# Patient Record
Sex: Male | Born: 1935 | Race: White | Hispanic: No | Marital: Married | Smoking: Former smoker
Health system: Southern US, Community
[De-identification: ages and names within clinical notes are randomized; demographics above are authoritative.]

## PROBLEM LIST (undated history)

## (undated) DIAGNOSIS — J449 Chronic obstructive pulmonary disease, unspecified: Secondary | ICD-10-CM

## (undated) DIAGNOSIS — E349 Endocrine disorder, unspecified: Secondary | ICD-10-CM

## (undated) DIAGNOSIS — M1990 Unspecified osteoarthritis, unspecified site: Secondary | ICD-10-CM

## (undated) DIAGNOSIS — Z9989 Dependence on other enabling machines and devices: Secondary | ICD-10-CM

## (undated) DIAGNOSIS — Z87442 Personal history of urinary calculi: Secondary | ICD-10-CM

## (undated) DIAGNOSIS — E7800 Pure hypercholesterolemia, unspecified: Secondary | ICD-10-CM

## (undated) DIAGNOSIS — R252 Cramp and spasm: Secondary | ICD-10-CM

## (undated) DIAGNOSIS — IMO0002 Reserved for concepts with insufficient information to code with codable children: Secondary | ICD-10-CM

## (undated) DIAGNOSIS — G5600 Carpal tunnel syndrome, unspecified upper limb: Secondary | ICD-10-CM

## (undated) DIAGNOSIS — M25512 Pain in left shoulder: Secondary | ICD-10-CM

## (undated) DIAGNOSIS — R002 Palpitations: Secondary | ICD-10-CM

## (undated) DIAGNOSIS — N200 Calculus of kidney: Secondary | ICD-10-CM

## (undated) DIAGNOSIS — F419 Anxiety disorder, unspecified: Secondary | ICD-10-CM

## (undated) DIAGNOSIS — R5383 Other fatigue: Secondary | ICD-10-CM

## (undated) DIAGNOSIS — E876 Hypokalemia: Secondary | ICD-10-CM

## (undated) DIAGNOSIS — C3490 Malignant neoplasm of unspecified part of unspecified bronchus or lung: Secondary | ICD-10-CM

## (undated) DIAGNOSIS — R0683 Snoring: Secondary | ICD-10-CM

## (undated) HISTORY — DX: Personal history of urinary calculi: Z87.442

## (undated) HISTORY — DX: Anxiety disorder, unspecified: F41.9

## (undated) HISTORY — DX: Dependence on other enabling machines and devices: Z99.89

## (undated) HISTORY — DX: Endocrine disorder, unspecified: E34.9

## (undated) HISTORY — DX: Pain in left shoulder: M25.512

## (undated) HISTORY — DX: Snoring: R06.83

## (undated) HISTORY — DX: Unspecified osteoarthritis, unspecified site: M19.90

## (undated) HISTORY — DX: Reserved for concepts with insufficient information to code with codable children: IMO0002

## (undated) HISTORY — DX: Pure hypercholesterolemia, unspecified: E78.00

## (undated) HISTORY — DX: Carpal tunnel syndrome, unspecified upper limb: G56.00

## (undated) HISTORY — DX: Chronic obstructive pulmonary disease, unspecified: J44.9

## (undated) HISTORY — DX: Palpitations: R00.2

## (undated) HISTORY — DX: Hypokalemia: E87.6

## (undated) HISTORY — DX: Malignant neoplasm of unspecified part of unspecified bronchus or lung: C34.90

## (undated) HISTORY — DX: Calculus of kidney: N20.0

## (undated) HISTORY — DX: Cramp and spasm: R25.2

## (undated) HISTORY — DX: Other fatigue: R53.83

---

## 1976-01-15 HISTORY — PX: BACK SURGERY: SHX140

## 1997-12-27 ENCOUNTER — Emergency Department (HOSPITAL_COMMUNITY): Admission: EM | Admit: 1997-12-27 | Discharge: 1997-12-27 | Payer: Self-pay | Admitting: Emergency Medicine

## 1997-12-27 ENCOUNTER — Encounter: Payer: Self-pay | Admitting: Emergency Medicine

## 1998-01-18 ENCOUNTER — Encounter: Payer: Self-pay | Admitting: *Deleted

## 1998-01-18 ENCOUNTER — Ambulatory Visit (HOSPITAL_COMMUNITY): Admission: RE | Admit: 1998-01-18 | Discharge: 1998-01-18 | Payer: Self-pay | Admitting: *Deleted

## 2000-06-27 ENCOUNTER — Encounter: Admission: RE | Admit: 2000-06-27 | Discharge: 2000-06-27 | Payer: Self-pay | Admitting: Family Medicine

## 2000-06-27 ENCOUNTER — Encounter: Payer: Self-pay | Admitting: Family Medicine

## 2002-04-12 ENCOUNTER — Encounter: Admission: RE | Admit: 2002-04-12 | Discharge: 2002-04-12 | Payer: Self-pay | Admitting: Orthopedic Surgery

## 2002-04-12 ENCOUNTER — Encounter: Payer: Self-pay | Admitting: Orthopedic Surgery

## 2004-01-27 ENCOUNTER — Ambulatory Visit: Payer: Self-pay | Admitting: Cardiovascular Disease

## 2004-04-09 ENCOUNTER — Ambulatory Visit: Payer: Self-pay | Admitting: Family Medicine

## 2004-05-24 ENCOUNTER — Ambulatory Visit: Payer: Self-pay | Admitting: Family Medicine

## 2004-10-08 ENCOUNTER — Ambulatory Visit: Payer: Self-pay | Admitting: Family Medicine

## 2004-10-08 LAB — CONVERTED CEMR LAB: PSA: 0.43 ng/mL

## 2005-01-21 ENCOUNTER — Ambulatory Visit: Payer: Self-pay | Admitting: Family Medicine

## 2005-03-25 ENCOUNTER — Ambulatory Visit: Payer: Self-pay | Admitting: Family Medicine

## 2005-05-06 ENCOUNTER — Ambulatory Visit: Payer: Self-pay | Admitting: Family Medicine

## 2005-08-12 ENCOUNTER — Emergency Department (HOSPITAL_COMMUNITY): Admission: EM | Admit: 2005-08-12 | Discharge: 2005-08-12 | Payer: Self-pay | Admitting: Family Medicine

## 2005-09-09 ENCOUNTER — Ambulatory Visit: Payer: Self-pay | Admitting: Internal Medicine

## 2005-11-07 ENCOUNTER — Ambulatory Visit: Payer: Self-pay | Admitting: Family Medicine

## 2005-11-07 LAB — CONVERTED CEMR LAB: Hgb A1c MFr Bld: 6.1 %

## 2005-11-14 ENCOUNTER — Ambulatory Visit: Payer: Self-pay | Admitting: Family Medicine

## 2005-12-09 ENCOUNTER — Ambulatory Visit: Payer: Self-pay | Admitting: Family Medicine

## 2006-03-03 ENCOUNTER — Ambulatory Visit: Payer: Self-pay | Admitting: Family Medicine

## 2006-05-23 ENCOUNTER — Encounter (INDEPENDENT_AMBULATORY_CARE_PROVIDER_SITE_OTHER): Payer: Self-pay | Admitting: *Deleted

## 2006-12-05 ENCOUNTER — Telehealth: Payer: Self-pay | Admitting: Family Medicine

## 2006-12-08 ENCOUNTER — Telehealth: Payer: Self-pay | Admitting: Family Medicine

## 2007-01-01 ENCOUNTER — Telehealth: Payer: Self-pay | Admitting: Family Medicine

## 2007-01-06 ENCOUNTER — Telehealth: Payer: Self-pay | Admitting: Family Medicine

## 2007-01-07 ENCOUNTER — Ambulatory Visit: Payer: Self-pay | Admitting: Family Medicine

## 2007-01-07 DIAGNOSIS — K279 Peptic ulcer, site unspecified, unspecified as acute or chronic, without hemorrhage or perforation: Secondary | ICD-10-CM

## 2007-01-07 DIAGNOSIS — R7303 Prediabetes: Secondary | ICD-10-CM

## 2007-01-07 DIAGNOSIS — N200 Calculus of kidney: Secondary | ICD-10-CM

## 2007-01-07 DIAGNOSIS — Z8679 Personal history of other diseases of the circulatory system: Secondary | ICD-10-CM | POA: Insufficient documentation

## 2007-01-07 DIAGNOSIS — M19049 Primary osteoarthritis, unspecified hand: Secondary | ICD-10-CM | POA: Insufficient documentation

## 2007-01-07 DIAGNOSIS — G5600 Carpal tunnel syndrome, unspecified upper limb: Secondary | ICD-10-CM | POA: Insufficient documentation

## 2007-01-07 DIAGNOSIS — M25519 Pain in unspecified shoulder: Secondary | ICD-10-CM | POA: Insufficient documentation

## 2007-01-07 DIAGNOSIS — E7800 Pure hypercholesterolemia, unspecified: Secondary | ICD-10-CM

## 2007-01-07 DIAGNOSIS — F411 Generalized anxiety disorder: Secondary | ICD-10-CM | POA: Insufficient documentation

## 2007-01-09 ENCOUNTER — Telehealth: Payer: Self-pay | Admitting: Family Medicine

## 2007-01-14 ENCOUNTER — Ambulatory Visit: Payer: Self-pay | Admitting: Family Medicine

## 2007-01-16 ENCOUNTER — Telehealth: Payer: Self-pay | Admitting: Family Medicine

## 2007-01-23 ENCOUNTER — Encounter: Payer: Self-pay | Admitting: Family Medicine

## 2007-01-23 LAB — CONVERTED CEMR LAB
ALT: 55 units/L — ABNORMAL HIGH (ref 0–53)
Albumin: 3.4 g/dL — ABNORMAL LOW (ref 3.5–5.2)
BUN: 14 mg/dL (ref 6–23)
CO2: 27 meq/L (ref 19–32)
Calcium: 8.9 mg/dL (ref 8.4–10.5)
Eosinophils Absolute: 0.2 10*3/uL (ref 0.0–0.6)
Eosinophils Relative: 2.5 % (ref 0.0–5.0)
GFR calc Af Amer: 107 mL/min
GFR calc non Af Amer: 89 mL/min
Hgb A1c MFr Bld: 6.3 % — ABNORMAL HIGH (ref 4.6–6.0)
MCV: 85.1 fL (ref 78.0–100.0)
Platelets: 228 10*3/uL (ref 150–400)
Potassium: 2.8 meq/L — ABNORMAL LOW (ref 3.5–5.1)
RBC: 4.75 M/uL (ref 4.22–5.81)
Total CHOL/HDL Ratio: 3.6
Triglycerides: 119 mg/dL (ref 0–149)
VLDL: 24 mg/dL (ref 0–40)
WBC: 9.5 10*3/uL (ref 4.5–10.5)

## 2007-01-28 ENCOUNTER — Telehealth: Payer: Self-pay | Admitting: Family Medicine

## 2007-01-30 ENCOUNTER — Ambulatory Visit: Payer: Self-pay | Admitting: Family Medicine

## 2007-02-02 ENCOUNTER — Telehealth (INDEPENDENT_AMBULATORY_CARE_PROVIDER_SITE_OTHER): Payer: Self-pay | Admitting: *Deleted

## 2007-02-06 ENCOUNTER — Encounter: Payer: Self-pay | Admitting: Family Medicine

## 2007-02-13 ENCOUNTER — Encounter: Payer: Self-pay | Admitting: Family Medicine

## 2007-02-16 ENCOUNTER — Telehealth: Payer: Self-pay | Admitting: Family Medicine

## 2007-03-31 ENCOUNTER — Ambulatory Visit: Payer: Self-pay | Admitting: Family Medicine

## 2007-04-01 LAB — CONVERTED CEMR LAB
ALT: 24 units/L (ref 0–53)
AST: 25 units/L (ref 0–37)
LDL Cholesterol: 74 mg/dL (ref 0–99)
Total CHOL/HDL Ratio: 4.1
VLDL: 37 mg/dL (ref 0–40)

## 2007-04-22 ENCOUNTER — Telehealth: Payer: Self-pay | Admitting: Family Medicine

## 2007-06-17 ENCOUNTER — Telehealth: Payer: Self-pay | Admitting: Family Medicine

## 2007-06-22 ENCOUNTER — Ambulatory Visit: Payer: Self-pay | Admitting: Family Medicine

## 2007-06-22 DIAGNOSIS — R0609 Other forms of dyspnea: Secondary | ICD-10-CM

## 2007-06-22 DIAGNOSIS — E291 Testicular hypofunction: Secondary | ICD-10-CM | POA: Insufficient documentation

## 2007-06-22 DIAGNOSIS — R0989 Other specified symptoms and signs involving the circulatory and respiratory systems: Secondary | ICD-10-CM

## 2007-06-23 LAB — CONVERTED CEMR LAB
ALT: 28 units/L (ref 0–53)
AST: 26 units/L (ref 0–37)
BUN: 15 mg/dL (ref 6–23)
Basophils Absolute: 0.1 10*3/uL (ref 0.0–0.1)
Bilirubin, Direct: 0.1 mg/dL (ref 0.0–0.3)
CO2: 30 meq/L (ref 19–32)
Calcium: 9.6 mg/dL (ref 8.4–10.5)
Glucose, Bld: 129 mg/dL — ABNORMAL HIGH (ref 70–99)
Lymphocytes Relative: 24.7 % (ref 12.0–46.0)
Monocytes Relative: 9.5 % (ref 3.0–12.0)
Phosphorus: 4 mg/dL (ref 2.3–4.6)
Platelets: 157 10*3/uL (ref 150–400)
Potassium: 4.6 meq/L (ref 3.5–5.1)
RDW: 13.5 % (ref 11.5–14.6)
TSH: 1.25 microintl units/mL (ref 0.35–5.50)
Total Bilirubin: 0.7 mg/dL (ref 0.3–1.2)

## 2007-07-08 ENCOUNTER — Ambulatory Visit: Payer: Self-pay | Admitting: Pulmonary Disease

## 2007-07-08 DIAGNOSIS — G4730 Sleep apnea, unspecified: Secondary | ICD-10-CM | POA: Insufficient documentation

## 2007-07-08 DIAGNOSIS — J449 Chronic obstructive pulmonary disease, unspecified: Secondary | ICD-10-CM | POA: Insufficient documentation

## 2007-07-29 ENCOUNTER — Encounter: Payer: Self-pay | Admitting: Pulmonary Disease

## 2007-07-29 ENCOUNTER — Ambulatory Visit (HOSPITAL_BASED_OUTPATIENT_CLINIC_OR_DEPARTMENT_OTHER): Admission: RE | Admit: 2007-07-29 | Discharge: 2007-07-29 | Payer: Self-pay | Admitting: Pulmonary Disease

## 2007-08-05 ENCOUNTER — Ambulatory Visit: Payer: Self-pay | Admitting: Pulmonary Disease

## 2007-08-06 ENCOUNTER — Telehealth: Payer: Self-pay | Admitting: Pulmonary Disease

## 2007-08-06 ENCOUNTER — Encounter: Payer: Self-pay | Admitting: Pulmonary Disease

## 2007-08-13 ENCOUNTER — Telehealth (INDEPENDENT_AMBULATORY_CARE_PROVIDER_SITE_OTHER): Payer: Self-pay | Admitting: *Deleted

## 2007-08-14 ENCOUNTER — Ambulatory Visit: Payer: Self-pay | Admitting: Pulmonary Disease

## 2007-08-17 ENCOUNTER — Encounter: Payer: Self-pay | Admitting: Pulmonary Disease

## 2007-09-13 ENCOUNTER — Encounter: Payer: Self-pay | Admitting: Pulmonary Disease

## 2007-09-14 ENCOUNTER — Telehealth (INDEPENDENT_AMBULATORY_CARE_PROVIDER_SITE_OTHER): Payer: Self-pay | Admitting: *Deleted

## 2007-09-15 ENCOUNTER — Ambulatory Visit: Payer: Self-pay | Admitting: Family Medicine

## 2007-10-26 ENCOUNTER — Telehealth (INDEPENDENT_AMBULATORY_CARE_PROVIDER_SITE_OTHER): Payer: Self-pay | Admitting: *Deleted

## 2007-10-27 ENCOUNTER — Ambulatory Visit: Payer: Self-pay | Admitting: Family Medicine

## 2007-12-08 ENCOUNTER — Telehealth: Payer: Self-pay | Admitting: Family Medicine

## 2007-12-11 ENCOUNTER — Telehealth: Payer: Self-pay | Admitting: Family Medicine

## 2007-12-28 ENCOUNTER — Ambulatory Visit: Payer: Self-pay | Admitting: Family Medicine

## 2008-02-08 ENCOUNTER — Telehealth: Payer: Self-pay | Admitting: Family Medicine

## 2008-03-29 ENCOUNTER — Ambulatory Visit: Payer: Self-pay | Admitting: Family Medicine

## 2008-03-29 DIAGNOSIS — I10 Essential (primary) hypertension: Secondary | ICD-10-CM

## 2008-03-30 LAB — CONVERTED CEMR LAB
Cholesterol: 110 mg/dL (ref 0–200)
Creatinine, Ser: 0.8 mg/dL (ref 0.4–1.5)
Creatinine,U: 87 mg/dL
Glucose, Bld: 129 mg/dL — ABNORMAL HIGH (ref 70–99)
HDL: 36.5 mg/dL — ABNORMAL LOW (ref >39.00)
Hgb A1c MFr Bld: 6.8 % — ABNORMAL HIGH (ref 4.6–6.5)
Microalb, Ur: 0.4 mg/dL (ref 0.0–1.9)
Sodium: 141 meq/L (ref 135–145)
Total CHOL/HDL Ratio: 3
Triglycerides: 140 mg/dL (ref 0.0–149.0)

## 2008-04-01 ENCOUNTER — Telehealth: Payer: Self-pay | Admitting: Family Medicine

## 2008-04-04 ENCOUNTER — Ambulatory Visit: Payer: Self-pay | Admitting: Family Medicine

## 2008-07-11 ENCOUNTER — Ambulatory Visit: Payer: Self-pay | Admitting: Family Medicine

## 2008-07-12 LAB — CONVERTED CEMR LAB
Albumin: 3.9 g/dL (ref 3.5–5.2)
BUN: 16 mg/dL (ref 6–23)
CO2: 29 meq/L (ref 19–32)
Calcium: 8.9 mg/dL (ref 8.4–10.5)
Chloride: 108 meq/L (ref 96–112)
Creatinine, Ser: 0.8 mg/dL (ref 0.4–1.5)
Hgb A1c MFr Bld: 6.5 % (ref 4.6–6.5)
Microalb Creat Ratio: 5.1 mg/g (ref 0.0–30.0)

## 2008-07-14 ENCOUNTER — Ambulatory Visit: Payer: Self-pay | Admitting: Family Medicine

## 2008-07-28 ENCOUNTER — Telehealth: Payer: Self-pay | Admitting: Family Medicine

## 2008-08-17 ENCOUNTER — Encounter: Payer: Self-pay | Admitting: Family Medicine

## 2008-10-24 ENCOUNTER — Encounter: Payer: Self-pay | Admitting: Family Medicine

## 2008-11-14 ENCOUNTER — Encounter: Payer: Self-pay | Admitting: Family Medicine

## 2008-12-12 ENCOUNTER — Encounter: Payer: Self-pay | Admitting: Family Medicine

## 2008-12-22 ENCOUNTER — Telehealth: Payer: Self-pay | Admitting: Family Medicine

## 2009-01-10 ENCOUNTER — Ambulatory Visit: Payer: Self-pay | Admitting: Family Medicine

## 2009-01-12 ENCOUNTER — Telehealth: Payer: Self-pay | Admitting: Family Medicine

## 2009-01-16 ENCOUNTER — Ambulatory Visit: Payer: Self-pay | Admitting: Family Medicine

## 2009-01-16 LAB — CONVERTED CEMR LAB
AST: 17 units/L (ref 0–37)
BUN: 14 mg/dL (ref 6–23)
CO2: 30 meq/L (ref 19–32)
Calcium: 9.3 mg/dL (ref 8.4–10.5)
Chloride: 101 meq/L (ref 96–112)
Cholesterol: 100 mg/dL (ref 0–200)
Creatinine, Ser: 1 mg/dL (ref 0.4–1.5)
GFR calc non Af Amer: 77.9 mL/min (ref >60)
HDL: 31.1 mg/dL — ABNORMAL LOW (ref >39.00)
LDL Cholesterol: 38 mg/dL (ref 0–99)
Triglycerides: 153 mg/dL — ABNORMAL HIGH (ref 0.0–149.0)
VLDL: 30.6 mg/dL (ref 0.0–40.0)

## 2009-01-19 ENCOUNTER — Telehealth: Payer: Self-pay | Admitting: Family Medicine

## 2009-01-20 ENCOUNTER — Ambulatory Visit: Payer: Self-pay | Admitting: Family Medicine

## 2009-02-21 ENCOUNTER — Ambulatory Visit: Payer: Self-pay | Admitting: Family Medicine

## 2009-03-16 ENCOUNTER — Telehealth: Payer: Self-pay | Admitting: Family Medicine

## 2009-05-12 ENCOUNTER — Encounter: Payer: Self-pay | Admitting: Family Medicine

## 2009-05-15 ENCOUNTER — Telehealth: Payer: Self-pay | Admitting: Family Medicine

## 2009-05-22 ENCOUNTER — Ambulatory Visit: Payer: Self-pay | Admitting: Family Medicine

## 2009-06-09 ENCOUNTER — Encounter: Payer: Self-pay | Admitting: Family Medicine

## 2009-07-25 LAB — HM DIABETES EYE EXAM: HM Diabetic Eye Exam: NORMAL

## 2009-08-21 ENCOUNTER — Ambulatory Visit: Payer: Self-pay | Admitting: Family Medicine

## 2009-08-23 LAB — CONVERTED CEMR LAB
Albumin: 4.1 g/dL (ref 3.5–5.2)
BUN: 18 mg/dL (ref 6–23)
CO2: 31 meq/L (ref 19–32)
Chloride: 106 meq/L (ref 96–112)
Cholesterol: 93 mg/dL (ref 0–200)
Creatinine, Ser: 0.9 mg/dL (ref 0.4–1.5)
HDL: 37.6 mg/dL — ABNORMAL LOW (ref >39.00)
Hgb A1c MFr Bld: 6.1 % (ref 4.6–6.5)
LDL Cholesterol: 37 mg/dL (ref 0–99)
Microalb Creat Ratio: 0.8 mg/g (ref 0.0–30.0)
Microalb, Ur: 0.7 mg/dL (ref 0.0–1.9)
Phosphorus: 3.7 mg/dL (ref 2.3–4.6)
Total CHOL/HDL Ratio: 2
Triglycerides: 94 mg/dL (ref 0.0–149.0)

## 2009-09-05 ENCOUNTER — Telehealth: Payer: Self-pay | Admitting: Family Medicine

## 2009-09-21 ENCOUNTER — Encounter: Payer: Self-pay | Admitting: Family Medicine

## 2010-01-02 ENCOUNTER — Telehealth: Payer: Self-pay | Admitting: Family Medicine

## 2010-01-10 ENCOUNTER — Ambulatory Visit
Admission: RE | Admit: 2010-01-10 | Discharge: 2010-01-10 | Payer: Self-pay | Source: Home / Self Care | Attending: Family Medicine | Admitting: Family Medicine

## 2010-02-15 NOTE — Assessment & Plan Note (Signed)
Summary: cough/alc   Vital Signs:  Patient profile:   76 year old male Height:      68 inches Weight:      242.25 pounds BMI:     36.97 Temp:     97.4 degrees F oral Pulse rate:   72 / minute Pulse rhythm:   regular BP sitting:   130 / 80  (left arm) Cuff size:   large  Vitals Entered By: Linde Gillis CMA Duncan Dull) (January 10, 2010 3:41 PM) CC: cough, congestion   History of Present Illness: Pt here for congestion. He is stopped up and having difficulty breathing.  He is having no fever or chills. The day he buried his son he was out in the cold rain for the funeral.  He has no headache, no ear pain, no rhinitis, no ST but cough that is mildly productive, with SOB and feels tight in the chest. He has been on an inhaler in the past but only for a few weeks. He denies N/V.  He has takenm Robitussin and Mucinex.   Problems Prior to Update: 1)  Essential Hypertension, Benign  (ICD-401.1) 2)  C O P D  (ICD-496) 3)  Obstructive Sleep Apnea  (ICD-780.57) 4)  Testosterone Deficiency  (ICD-257.2) 5)  Snoring  (ICD-786.09) 6)  Carpal Tunnel Syndrome  (ICD-354.0) 7)  Osteoarthritis, Hands, Bilateral  (ICD-715.94) 8)  Palpitations, Hx of  (ICD-V12.50) 9)  Shoulder Pain, Left, Chronic  (ICD-719.41) 10)  Hypercholesterolemia  (ICD-272.0) 11)  Peptic Ulcer Disease  (ICD-533.90) 12)  Nephrolithiasis, Hx of  (ICD-V13.01) 13)  Diabetes Mellitus, Type II  (ICD-250.00) 14)  Anxiety  (ICD-300.00)  Medications Prior to Update: 1)  Xanax 0.5 Mg Tabs (Alprazolam) .... Take 1 Tablet By Mouth At Bedtime 2)  Flexeril 10 Mg Tabs (Cyclobenzaprine Hcl) .... Take 1 Tablet By Mouth Three Times A Day 3)  Crestor 40 Mg  Tabs (Rosuvastatin Calcium) .Marland Kitchen.. 1 By Mouth Once Daily 4)  Bisoprolol Fumarate 5 Mg  Tabs (Bisoprolol Fumarate) .Marland Kitchen.. 1 By Mouth Once Daily 5)  Potassium Chloride 20 Meq  Pack (Potassium Chloride) .... One By Mouth Daily 6)  Cpap .Marland Kitchen.. 8cm 7)  Paxil 20 Mg Tabs (Paroxetine Hcl) .Marland Kitchen.. 1 By  Mouth Once Daily 8)  Ultram 50 Mg Tabs (Tramadol Hcl) .... Take By Mouth As Directed 9)  Lisinopril 5 Mg Tabs (Lisinopril) .Marland Kitchen.. 1 By Mouth Once Daily in Am 10)  Hydrocodone-Homatropine 5-1.5 Mg/96ml Syrp (Hydrocodone-Homatropine) .Marland Kitchen.. 1 Tsp By Mouth At Bedtime As Needed Cough 11)  Glucose Test Strips and Lancets .... To Check Blood Sugar Two Times A Day and As Needed For Dm That Is Not Well Controlled 250.0 12)  Glucometer- [facility] .... To Check Glucose Once Daily and As Needed For Dm 250.0  Allergies: 1)  ! Prednisone 2)  Asa 3)  Buspar  Physical Exam  General:  overweight but generally well appearing, mildly chest congested.  Head:  normocephalic, atraumatic, and no abnormalities observed.  Sinuses NT. Eyes:  Conjunctiva clear bilaterally.  Ears:  R ear normal and L ear normal.   Nose:  External nasal examination shows no deformity or inflammation. Nasal mucosa are pink and moist without lesions or exudates. Mouth:  pharynx pink and moist.  No PND seen. Neck:  supple with full rom and no masses or thyromegally, no JVD or carotid bruit  Chest Wall:  No deformities, masses, tenderness or gynecomastia noted. Lungs:  Normal respiratory effort, chest expands symmetrically. Lungs are clear  to auscultation, no crackles or wheezes except fine distant wheezing in the right lung and anteriorally over the manubrial area. (diffusely distant bs) Heart:  Normal rate and regular rhythm. S1 and S2 normal without gallop, murmur, click, rub or other extra sounds.   Impression & Recommendations:  Problem # 1:  BRONCHITIS- ACUTE (ICD-466.0) Assessment New  See instructions. Stop Mucinex. His updated medication list for this problem includes:    Hydrocodone-homatropine 5-1.5 Mg/67ml Syrp (Hydrocodone-homatropine) .Marland Kitchen... 1 tsp by mouth at bedtime as needed cough    Zithromax Z-pak 250 Mg Tabs (Azithromycin) .Marland Kitchen... As dir  Take antibiotics and other medications as directed. Encouraged to  push clear liquids, get enough rest, and take acetaminophen as needed. To be seen in 5-7 days if no improvement, sooner if worse.  Complete Medication List: 1)  Xanax 0.5 Mg Tabs (Alprazolam) .... Take 1 tablet by mouth at bedtime 2)  Flexeril 10 Mg Tabs (Cyclobenzaprine hcl) .... Take 1 tablet by mouth three times a day 3)  Crestor 40 Mg Tabs (Rosuvastatin calcium) .Marland Kitchen.. 1 by mouth once daily 4)  Bisoprolol Fumarate 5 Mg Tabs (Bisoprolol fumarate) .Marland Kitchen.. 1 by mouth once daily 5)  Potassium Chloride 20 Meq Pack (Potassium chloride) .... One by mouth daily 6)  Cpap  .Marland Kitchen.. 8cm 7)  Paxil 20 Mg Tabs (Paroxetine hcl) .Marland Kitchen.. 1 by mouth once daily 8)  Ultram 50 Mg Tabs (Tramadol hcl) .... Take by mouth as directed 9)  Lisinopril 5 Mg Tabs (Lisinopril) .Marland Kitchen.. 1 by mouth once daily in am 10)  Hydrocodone-homatropine 5-1.5 Mg/37ml Syrp (Hydrocodone-homatropine) .Marland Kitchen.. 1 tsp by mouth at bedtime as needed cough 11)  Glucose Test Strips and Lancets  .... To check blood sugar two times a day and as needed for dm that is not well controlled 250.0 12)  Glucometer- [facility]  .... To check glucose once daily and as needed for dm 250.0 13)  Zithromax Z-pak 250 Mg Tabs (Azithromycin) .... As dir  Patient Instructions: 1)  Take Zithromax. 2)  Take Guaifenesin by going to [facility] or [facility] and getting MUCOUS RELIEF EXPECTORANT (400mg ), take 11/2 tabs by mouth AM and NOON. 3)  Drink lots of fluids anytime taking Guaifenesin.  4)  Take Tyl ES 2 tabs by mouth three times a day for 4-5 days. Prescriptions: ZITHROMAX Z-PAK 250 MG TABS (AZITHROMYCIN) as dir  #1 pak x 0   Entered and Authorized by:   Shaune Leeks MD   Signed by:   Shaune Leeks MD on 01/10/2010   Method used:   Print then Give to Patient   RxID:   [id number]    Orders Added: 1)  Est. Patient Level III [14782]    Current Allergies (reviewed today): ! PREDNISONE ASA BUSPAR

## 2010-02-15 NOTE — Assessment & Plan Note (Signed)
Summary: 1 month follow up bring blood sugar log/rbh   Vital Signs:  Patient profile:   76 year old male Height:      68 inches Weight:      244.25 pounds BMI:     37.27 Temp:     97.6 degrees F oral Pulse rate:   56 / minute Pulse rhythm:   regular BP sitting:   128 / 78  (left arm) Cuff size:   large  Vitals Entered By: Lewanda Rife LPN (February 21, 2009 9:07 AM)  History of Present Illness: here to rev blood sugar log for DM last visit was worse with AIC 7 pt refused DM teaching at that time  sugars are very good with sugar generally below 130 in am , and low 100s to 120s in the am   is overall eating much better  cut out some starches and fat -- nothing over 14-15 carbs read sugar busters book  eating chicken and salad and lots of green veg not as hard as he thought it would be  is exercising on bike   feet have been numb for years   wt is down 7 lb with BMI of 37  opthy  is on ace not candidate for asa - is intolerant   did not get flu shot this year  is open to pneumovax      Allergies: 1)  ! 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) SNORING (ICD-786.09) FATIGUE (ICD-780.79) HYPOPOTASSEMIA (ICD-276.8) SPECIAL SCREENING MALIGNANT NEOPLASM OF PROSTATE (ICD-V76.44) LEG CRAMPS (ICD-729.82) CARPAL TUNNEL SYNDROME (ICD-354.0) OSTEOARTHRITIS, HANDS, BILATERAL (ICD-715.94) PALPITATIONS, HX OF (ICD-V12.50) SHOULDER PAIN, LEFT, CHRONIC (ICD-719.41) HYPERCHOLESTEROLEMIA (ICD-272.0) PEPTIC ULCER DISEASE (ICD-533.90) NEPHROLITHIASIS, HX OF (ICD-V13.01) DIABETES MELLITUS, TYPE II (ICD-250.00) ANXIETY (ICD-300.00)   urology-- Dr Vonita Moss [facility]- [place]  Past Surgical History: Last updated: 11/20/2008 Back surgery x 2- disc Right knee arthroscopic surgery (1998) Left shoulder injection- rotator cuff (08/1995) Cardiac cath- mod CAD- single  vessel (01/1998) Treadmill- positive (12/1997) Colonoscopy- polyps (02/1998) EGD with dilation- esophagitis (02/1998) Refuses follow up colonoscopy 10/10 R foot fracture   Family History: Last updated: 10/27/2007 Father: HTN Mother: HTN Siblings: brother died age 106 from MI, kidney cancer sister with cancer   Social History: Last updated: 10/27/2007 Marital Status: Married Children: 2 Occupation: retired quit smoking 1995 Former Smoker no alcohol   Risk Factors: Smoking Status: quit (01/07/2007)  Review of Systems General:  Denies fatigue, fever, loss of appetite, and malaise. Eyes:  Denies blurring and eye irritation. CV:  Denies chest pain or discomfort and palpitations. Resp:  Denies cough and shortness of breath. GI:  Denies abdominal pain, change in bowel habits, indigestion, and nausea. GU:  Denies urinary frequency. MS:  Complains of stiffness. Derm:  Denies lesion(s), poor wound healing, and rash. Neuro:  Denies numbness and tingling. Endo:  Denies cold intolerance, excessive thirst, excessive urination, and heat intolerance. Heme:  Denies abnormal bruising and bleeding.  Physical Exam  General:  overweight but generally well appearing  Head:  normocephalic, atraumatic, and no abnormalities observed.   Eyes:  vision grossly intact, pupils equal, pupils round, and pupils reactive to light.   Mouth:  pharynx pink and moist.   Neck:  supple with full rom and no masses or thyromegally, no JVD or carotid bruit  Lungs:  Normal respiratory effort, chest expands symmetrically. Lungs are clear to auscultation, no crackles or wheezes. (diffusely  distant bs) Heart:  Normal rate and regular rhythm. S1 and S2 normal without gallop, murmur, click, rub or other extra sounds. Msk:  No deformity or scoliosis noted of thoracic or lumbar spine.   Pulses:  plus one pedal pulses Extremities:  No clubbing, cyanosis, edema, or deformity noted with normal full range of motion of  all joints.   Neurologic:  sensation intact to light touch, gait normal, and DTRs symmetrical and normal.   Skin:  Intact without suspicious lesions or rashes Cervical Nodes:  No lymphadenopathy noted Psych:  cheerful- positive attitude today  Diabetes Management Exam:    Foot Exam (with socks and/or shoes not present):       Sensory-Pinprick/Light touch:          Left medial foot (L-4): normal          Left dorsal foot (L-5): normal          Left lateral foot (S-1): normal          Right medial foot (L-4): normal          Right dorsal foot (L-5): normal          Right lateral foot (S-1): normal       Sensory-Monofilament:          Left foot: normal          Right foot: normal       Inspection:          Left foot: normal          Right foot: normal       Nails:          Left foot: normal          Right foot: normal   Impression & Recommendations:  Problem # 1:  DIABETES MELLITUS, TYPE II (ICD-250.00) Assessment Improved improved with diet/ wt loss and exercise commended on this  adv to get his eye exam declined flu shot this year - and now late  will get pneumovax, however  adv him to check several time per week (once daily max) plan to check AIC 3 m--expect imp  f/u 6 mo  His updated medication list for this problem includes:    Lisinopril 5 Mg Tabs (Lisinopril) .Marland Kitchen... 1 by mouth once daily in am  Problem # 2:  ESSENTIAL HYPERTENSION, BENIGN (ICD-401.1) Assessment: Unchanged control is adequate with current medicines His updated medication list for this problem includes:    Bisoprolol Fumarate 5 Mg Tabs (Bisoprolol fumarate) .Marland Kitchen... 1 by mouth once daily    Lisinopril 5 Mg Tabs (Lisinopril) .Marland Kitchen... 1 by mouth once daily in am  BP today: 128/78 Prior BP: 132/82 (01/20/2009)  Labs Reviewed: K+: 4.0 (01/16/2009) Creat: : 1.0 (01/16/2009)   Chol: 100 (01/16/2009)   HDL: 31.10 (01/16/2009)   LDL: 38 (01/16/2009)   TG: 153.0 (01/16/2009)  Problem # 3:   HYPERCHOLESTEROLEMIA (ICD-272.0) Assessment: Improved very good control to goal with crestor and diet  rev last labs rev low sat fat diet- doing well  His updated medication list for this problem includes:    Crestor 40 Mg Tabs (Rosuvastatin calcium) .Marland Kitchen... 1 by mouth once daily  Labs Reviewed: SGOT: 17 (01/16/2009)   SGPT: 19 (01/16/2009)   HDL:31.10 (01/16/2009), 36.50 (03/29/2008)  LDL:38 (01/16/2009), 46 (03/29/2008)  Chol:100 (01/16/2009), 110 (03/29/2008)  Trig:153.0 (01/16/2009), 140.0 (03/29/2008)  Complete Medication List: 1)  Xanax 0.5 Mg Tabs (Alprazolam) .... Take 1 tablet by mouth at bedtime 2)  Flexeril 10 Mg  Tabs (Cyclobenzaprine hcl) .... Take 1 tablet by mouth three times a day 3)  Crestor 40 Mg Tabs (Rosuvastatin calcium) .Marland Kitchen.. 1 by mouth once daily 4)  Bisoprolol Fumarate 5 Mg Tabs (Bisoprolol fumarate) .Marland Kitchen.. 1 by mouth once daily 5)  Potassium Chloride 20 Meq Pack (Potassium chloride) .... One by mouth daily 6)  Cpap  .Marland Kitchen.. 8cm 7)  Paxil 20 Mg Tabs (Paroxetine hcl) .Marland Kitchen.. 1 by mouth once daily 8)  Ultram 50 Mg Tabs (Tramadol hcl) .... Take by mouth as directed 9)  Lisinopril 5 Mg Tabs (Lisinopril) .Marland Kitchen.. 1 by mouth once daily in am 10)  Hydrocodone-homatropine 5-1.5 Mg/67ml Syrp (Hydrocodone-homatropine) .Marland Kitchen.. 1 tsp by mouth at bedtime as needed cough 11)  Glucose Test Strips and Lancets  .... To check blood sugar two times a day and as needed for dm that is not well controlled 250.0 12)  Glucometer- [facility]  .... To check glucose once daily and as needed for dm 250.0  Other Orders: Pneumococcal Vaccine (47829) Admin 1st Vaccine (56213) Admin 1st Vaccine [facility]) [phone])  Patient Instructions: 1)  do not forget to make your eye doctor appt  2)  please try to check sugar am and pm about twice a week or whenever you want to extra  3)  keep up the good work with healthy diet and exercise  4)  schedule non fasting labs in 3 months AIC  250.0 5)  follow up with me  in about 6 months  Prescriptions: GLUCOMETER- [employer] to check glucose once daily and as needed for DM 250.0  #1 x 0   Entered and Authorized by:   Judith Part MD   Signed by:   Judith Part MD on 02/21/2009   Method used:   Print then Give to Patient   RxID:   [id number]   Current Allergies (reviewed today): ! PREDNISONE ASA BUSPAR   Preventive Care Screening  Contraindications of Treatment or Deferment of Test/Procedure:    Treatment: Aspirin    Contraindication: ASA hypersensitivity    Pneumovax Vaccine    Vaccine Type: Pneumovax    Site: left deltoid    Mfr: Merck    Dose: 0.5 ml    Route: IM    Given by: Lewanda Rife LPN    Exp. Date: 05/04/2010    Lot #: [id number]    VIS given: 08/12/95 version given February 21, 2009.

## 2010-02-15 NOTE — Progress Notes (Signed)
Summary: Rx Flexeril  Phone Note Refill Request Call back at [phone] Message from:  [facility] on March 16, 2009 3:19 PM  Refills Requested: Medication #1:  FLEXERIL 10 MG TABS Take 1 tablet by mouth three times a day   Last Refilled: 02/16/2009 Received faxed refill request, please advise   Method Requested: Electronic Initial call taken by: Linde Gillis CMA Duncan Dull),  March 16, 2009 3:21 PM  Follow-up for Phone Call        px written on EMR for call in  Follow-up by: Judith Part MD,  March 16, 2009 3:46 PM  Additional Follow-up for Phone Call Additional follow up Details #1::        Medication phoned to [facility] pharmacy as instructed. Lewanda Rife LPN  March 17, 2839 3:55 PM     New/Updated Medications: FLEXERIL 10 MG TABS (CYCLOBENZAPRINE HCL) Take 1 tablet by mouth three times a day Prescriptions: FLEXERIL 10 MG TABS (CYCLOBENZAPRINE HCL) Take 1 tablet by mouth three times a day  #90 x 5   Entered and Authorized by:   Judith Part MD   Signed by:   Lewanda Rife LPN on 32/44/0102   Method used:   Telephoned to ...       [facility] (retail)       [address] [place]       [address]       [place], [place]  [address]       Ph: [phone]       Fax: [phone]   RxID:   [id number]

## 2010-02-15 NOTE — Letter (Signed)
Summary: [facility]  [facility]   Imported By: Lanelle Bal 06/09/2009 11:45:07  _____________________________________________________________________  External Attachment:    Type:   Image     Comment:   External Document

## 2010-02-15 NOTE — Assessment & Plan Note (Signed)
Summary: F/U AFTER LABS / LFW   Vital Signs:  Patient profile:   76 year old male Height:      68 inches Weight:      229.50 pounds BMI:     35.02 Temp:     97.9 degrees F oral Pulse rate:   52 / minute Pulse rhythm:   regular BP sitting:   116 / 70  (left arm) Cuff size:   large  Vitals Entered By: Lewanda Rife LPN (August 21, 2009 9:04 AM) CC: six month f/u   History of Present Illness: pt has lost 15 lb -- is happy with that   bp good 116/70  here for 6 mo f/u of DM and HTN and lipids  on crestor and diet   last AIC was 6.4 in may opthy -- is supposed to have eye surgery -- Dr Hazle Quant -- no retinop recent / has cataract in L eye    diet - is pretty good - no sweets at all  sugars are very good   Allergies: 1)  ! 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) SNORING (ICD-786.09) FATIGUE (ICD-780.79) HYPOPOTASSEMIA (ICD-276.8) SPECIAL SCREENING MALIGNANT NEOPLASM OF PROSTATE (ICD-V76.44) LEG CRAMPS (ICD-729.82) CARPAL TUNNEL SYNDROME (ICD-354.0) OSTEOARTHRITIS, HANDS, BILATERAL (ICD-715.94) PALPITATIONS, HX OF (ICD-V12.50) SHOULDER PAIN, LEFT, CHRONIC (ICD-719.41) HYPERCHOLESTEROLEMIA (ICD-272.0) PEPTIC ULCER DISEASE (ICD-533.90) NEPHROLITHIASIS, HX OF (ICD-V13.01) DIABETES MELLITUS, TYPE II (ICD-250.00) ANXIETY (ICD-300.00)   urology-- Dr Vonita Moss [facility]- [place]  Past Surgical History: Last updated: 11/20/2008 Back surgery x 2- disc Right knee arthroscopic surgery (1998) Left shoulder injection- rotator cuff (08/1995) Cardiac cath- mod CAD- single vessel (01/1998) Treadmill- positive (12/1997) Colonoscopy- polyps (02/1998) EGD with dilation- esophagitis (02/1998) Refuses follow up colonoscopy 10/10 R foot fracture   Family History: Last updated: 10/27/2007 Father: HTN Mother: HTN Siblings: brother died age 48 from MI, kidney  cancer sister with cancer   Social History: Last updated: 10/27/2007 Marital Status: Married Children: 2 Occupation: retired quit smoking 1995 Former Smoker no alcohol   Risk Factors: Smoking Status: quit (01/07/2007)  Review of Systems General:  Denies fatigue, loss of appetite, and malaise. Eyes:  Denies blurring and eye pain. CV:  Denies chest pain or discomfort, fatigue, lightheadness, and palpitations. Resp:  Denies cough, shortness of breath, and wheezing. GI:  Denies abdominal pain, change in bowel habits, and indigestion. GU:  Denies dysuria. MS:  Denies muscle aches and cramps. Derm:  Denies itching, lesion(s), poor wound healing, and rash. Neuro:  Denies numbness and tingling. Psych:  Denies anxiety and depression. Endo:  Denies cold intolerance, excessive thirst, excessive urination, and heat intolerance. Heme:  Denies abnormal bruising and bleeding.  Physical Exam  General:  overweight but generally well appearing  Head:  normocephalic, atraumatic, and no abnormalities observed.   Eyes:  vision grossly intact, pupils equal, pupils round, and pupils reactive to light.   Mouth:  pharynx pink and moist.   Neck:  supple with full rom and no masses or thyromegally, no JVD or carotid bruit  Chest Wall:  No deformities, masses, tenderness or gynecomastia noted. Lungs:  Normal respiratory effort, chest expands symmetrically. Lungs are clear to auscultation, no crackles or wheezes. (diffusely distant bs) Heart:  Normal rate and regular rhythm. S1 and S2 normal without gallop, murmur, click, rub or other extra sounds. Abdomen:  Bowel sounds positive,abdomen soft and non-tender without masses, organomegaly or hernias noted. Msk:  No deformity or scoliosis noted of thoracic or lumbar spine.  no new joint changes  Pulses:  R and L carotid,radial,femoral,dorsalis pedis and posterior tibial pulses are full and equal bilaterally Extremities:  No clubbing, cyanosis, edema, or  deformity noted with normal full range of motion of all joints.   Neurologic:  sensation intact to light touch, gait normal, and DTRs symmetrical and normal.   Skin:  Intact without suspicious lesions or rashes Cervical Nodes:  No lymphadenopathy noted Inguinal Nodes:  No significant adenopathy Psych:  cheerful- positive attitude today  Diabetes Management Exam:    Foot Exam (with socks and/or shoes not present):       Sensory-Pinprick/Light touch:          Left medial foot (L-4): normal          Left dorsal foot (L-5): normal          Left lateral foot (S-1): normal          Right medial foot (L-4): normal          Right dorsal foot (L-5): normal          Right lateral foot (S-1): normal       Sensory-Monofilament:          Left foot: normal          Right foot: normal       Inspection:          Left foot: normal          Right foot: normal       Nails:          Left foot: normal          Right foot: normal    Eye Exam:       Eye Exam done elsewhere          Date: 07/25/2009          Results: normal          Done by: Dr Hazle Quant   Impression & Recommendations:  Problem # 1:  ESSENTIAL HYPERTENSION, BENIGN (ICD-401.1) Assessment Unchanged  good control  med refilled enc further wt loss His updated medication list for this problem includes:    Bisoprolol Fumarate 5 Mg Tabs (Bisoprolol fumarate) .Marland Kitchen... 1 by mouth once daily    Lisinopril 5 Mg Tabs (Lisinopril) .Marland Kitchen... 1 by mouth once daily in am  Orders: Venipuncture (29562) TLB-Lipid Panel (80061-LIPID) TLB-Renal Function Panel (80069-RENAL) TLB-ALT (SGPT) (84460-ALT) TLB-AST (SGOT) (84450-SGOT) TLB-A1C / Hgb A1C (Glycohemoglobin) (83036-A1C) TLB-Microalbumin/Creat Ratio, Urine (82043-MALB)  BP today: 116/70 Prior BP: 128/78 (02/21/2009)  Labs Reviewed: K+: 4.0 (01/16/2009) Creat: : 1.0 (01/16/2009)   Chol: 100 (01/16/2009)   HDL: 31.10 (01/16/2009)   LDL: 38 (01/16/2009)   TG: 153.0 (01/16/2009)  Problem # 2:   HYPERCHOLESTEROLEMIA (ICD-272.0) Assessment: Unchanged  check today-has been well controlled with statin and diet  rev low sat fat diet His updated medication list for this problem includes:    Crestor 40 Mg Tabs (Rosuvastatin calcium) .Marland Kitchen... 1 by mouth once daily  Orders: Venipuncture (13086) TLB-Lipid Panel (80061-LIPID) TLB-Renal Function Panel (80069-RENAL) TLB-ALT (SGPT) (84460-ALT) TLB-AST (SGOT) (84450-SGOT) TLB-A1C / Hgb A1C (Glycohemoglobin) (83036-A1C) TLB-Microalbumin/Creat Ratio, Urine (82043-MALB)  Labs Reviewed: SGOT: 17 (01/16/2009)   SGPT: 19 (01/16/2009)   HDL:31.10 (01/16/2009), 36.50 (03/29/2008)  LDL:38 (01/16/2009), 46 (03/29/2008)  Chol:100 (01/16/2009), 110 (03/29/2008)  Trig:153.0 (01/16/2009), 140.0 (03/29/2008)  Problem # 3:  DIABETES MELLITUS, TYPE II (ICD-250.00) Assessment: Unchanged  check AIc and  microalb today opthy up to date good diet enc further wt loss lab and f/u 6 mo  His updated medication list for this problem includes:    Lisinopril 5 Mg Tabs (Lisinopril) .Marland Kitchen... 1 by mouth once daily in am  Orders: Venipuncture (16109) TLB-Lipid Panel (80061-LIPID) TLB-Renal Function Panel (80069-RENAL) TLB-ALT (SGPT) (84460-ALT) TLB-AST (SGOT) (84450-SGOT) TLB-A1C / Hgb A1C (Glycohemoglobin) (83036-A1C) TLB-Microalbumin/Creat Ratio, Urine (82043-MALB)  Labs Reviewed: Creat: 1.0 (01/16/2009)     Last Eye Exam: normal (11/15/2007) Reviewed HgBA1c results: 6.4 (05/22/2009)  7.0 (01/16/2009)  Problem # 4:  Preventive Health Care (ICD-V70.0) Assessment: Comment Only Td today  Complete Medication List: 1)  Xanax 0.5 Mg Tabs (Alprazolam) .... Take 1 tablet by mouth at bedtime 2)  Flexeril 10 Mg Tabs (Cyclobenzaprine hcl) .... Take 1 tablet by mouth three times a day 3)  Crestor 40 Mg Tabs (Rosuvastatin calcium) .Marland Kitchen.. 1 by mouth once daily 4)  Bisoprolol Fumarate 5 Mg Tabs (Bisoprolol fumarate) .Marland Kitchen.. 1 by mouth once daily 5)  Potassium Chloride 20  Meq Pack (Potassium chloride) .... One by mouth daily 6)  Cpap  .Marland Kitchen.. 8cm 7)  Paxil 20 Mg Tabs (Paroxetine hcl) .Marland Kitchen.. 1 by mouth once daily 8)  Ultram 50 Mg Tabs (Tramadol hcl) .... Take by mouth as directed 9)  Lisinopril 5 Mg Tabs (Lisinopril) .Marland Kitchen.. 1 by mouth once daily in am 10)  Hydrocodone-homatropine 5-1.5 Mg/57ml Syrp (Hydrocodone-homatropine) .Marland Kitchen.. 1 tsp by mouth at bedtime as needed cough 11)  Glucose Test Strips and Lancets  .... To check blood sugar two times a day and as needed for dm that is not well controlled 250.0 12)  Glucometer- [employer]  .... To check glucose once daily and as needed for dm 250.0  Other Orders: TD Toxoids IM 7 YR + (60454) Admin 1st Vaccine (09811) Admin 1st Vaccine [facility]) [phone])  Patient Instructions: 1)  keep up the good job with diet and weight loss  2)  labs today  3)  tetnus shot today  4)  schedule non fasting labs and follow up in 6 months with AIC , renal 250.0 Prescriptions: BISOPROLOL FUMARATE 5 MG  TABS (BISOPROLOL FUMARATE) 1 by mouth once daily  #30 x 11   Entered and Authorized by:   Judith Part MD   Signed by:   Judith Part MD on 08/21/2009   Method used:   Print then Give to Patient   RxID:   [id number]   Current Allergies (reviewed today): ! PREDNISONE ASA BUSPAR   Tetanus/Td Vaccine    Vaccine Type: Td    Site: left deltoid    Mfr: Sanofi Pasteur    Dose: 0.5 ml    Route: IM    Given by: Lewanda Rife LPN    Exp. Date: 02/15/2011    Lot #: [id number]    VIS given: 12/02/06 version given August 21, 2009.

## 2010-02-15 NOTE — Assessment & Plan Note (Signed)
Summary: 6 MONTH FOLLOW UP AFTER AFTER LABS/[facility]   Vital Signs:  Patient profile:   76 year old male Weight:      251 pounds BMI:     38.30 Temp:     97.6 degrees F oral Pulse rate:   56 / minute Pulse rhythm:   regular BP sitting:   132 / 82  (left arm) Cuff size:   large  Vitals Entered By: Lowella Petties CMA (January 20, 2009 9:09 AM) CC: 6 month follow up Is Patient Diabetic? Yes   History of Present Illness: here for f/u of lipid/htn/ DM other chronic problems   had uri over holidays - that is finally better   bp 132/82 first check today  lipids last check excellent with LDL at 38  DM -- worse with AIC 7.0 (up from 6.5) not eating "any sugar "  has had too many potato (? maybe more sweets over christmas)  opthy-- could not go in oct - needs to re schedule (also cataract)  wt up 1 lb  broke his foot R -- in oct- no walking since  feel at [employer]  is out of boot   is free to start exercise   looses his balance easily   K is ok   Allergies: 1)  ! Prednisone 2)  Asa 3)  Buspar  Review of Systems General:  Denies fatigue, fever, loss of appetite, and malaise. Eyes:  Denies blurring and eye irritation. CV:  Denies chest pain or discomfort, palpitations, shortness of breath with exertion, and swelling of feet. Resp:  Denies cough, shortness of breath, and wheezing. GI:  Denies abdominal pain, bloody stools, and change in bowel habits. GU:  Denies urinary frequency. MS:  Complains of joint pain and stiffness; denies muscle aches. Derm:  Denies lesion(s), poor wound healing, and rash. Neuro:  Complains of numbness and tingling; in feet for years. Psych:  mood is about the same. Endo:  Denies cold intolerance, excessive thirst, excessive urination, and heat intolerance. Heme:  Denies abnormal bruising and bleeding.  Physical Exam  General:  overweight but generally well appearing  Head:  normocephalic, atraumatic, and no abnormalities observed.     Eyes:  vision grossly intact, pupils equal, pupils round, and pupils reactive to light.   Ears:  R ear normal and L ear normal.   Mouth:  pharynx pink and moist.   Neck:  supple with full rom and no masses or thyromegally, no JVD or carotid bruit  Lungs:  Normal respiratory effort, chest expands symmetrically. Lungs are clear to auscultation, no crackles or wheezes. (diffusely distant bs) Heart:  Normal rate and regular rhythm. S1 and S2 normal without gallop, murmur, click, rub or other extra sounds. Msk:  No deformity or scoliosis noted of thoracic or lumbar spine.   Pulses:  plus one pedal pulses Extremities:  No clubbing, cyanosis, edema, or deformity noted with normal full range of motion of all joints.   Neurologic:  sensation intact to light touch, gait normal, and DTRs symmetrical and normal.   Skin:  Intact without suspicious lesions or rashes Cervical Nodes:  No lymphadenopathy noted Psych:  nl affect   Diabetes Management Exam:    Foot Exam (with socks and/or shoes not present):       Sensory-Pinprick/Light touch:          Left medial foot (L-4): normal          Left dorsal foot (L-5): normal  Left lateral foot (S-1): normal          Right medial foot (L-4): normal          Right dorsal foot (L-5): normal          Right lateral foot (S-1): normal       Sensory-Monofilament:          Left foot: normal          Right foot: normal       Inspection:          Left foot: normal          Right foot: normal       Nails:          Left foot: normal          Right foot: normal   Impression & Recommendations:  Problem # 1:  ESSENTIAL HYPERTENSION, BENIGN (ICD-401.1) Assessment Unchanged  bp is fairly controlled- continue to monitor lab reviewed rec start exercise His updated medication list for this problem includes:    Bisoprolol Fumarate 5 Mg Tabs (Bisoprolol fumarate) .Marland Kitchen... 1 by mouth once daily    Lisinopril 5 Mg Tabs (Lisinopril) .Marland Kitchen... 1 by mouth once daily  in am  BP today: 132/82 Prior BP: 130/76 (01/10/2009)  Labs Reviewed: K+: 4.0 (01/16/2009) Creat: : 1.0 (01/16/2009)   Chol: 100 (01/16/2009)   HDL: 31.10 (01/16/2009)   LDL: 38 (01/16/2009)   TG: 153.0 (01/16/2009)  Problem # 2:  HYPERCHOLESTEROLEMIA (ICD-272.0) Assessment: Unchanged  very good control with crestor  rev low sat fat diet  His updated medication list for this problem includes:    Crestor 40 Mg Tabs (Rosuvastatin calcium) .Marland Kitchen... 1 by mouth once daily  Labs Reviewed: SGOT: 17 (01/16/2009)   SGPT: 19 (01/16/2009)   HDL:31.10 (01/16/2009), 36.50 (03/29/2008)  LDL:38 (01/16/2009), 46 (03/29/2008)  Chol:100 (01/16/2009), 110 (03/29/2008)  Trig:153.0 (01/16/2009), 140.0 (03/29/2008)  Problem # 3:  DIABETES MELLITUS, TYPE II (ICD-250.00) Assessment: Deteriorated BM is worse with AIc 7.0  poss from lack of exercise or worse diet  rec DM classes- pt refused  asked him to get sugar busters book and start working on Dm diet  now that foot is healed - rec 20 min exericse daily and wt loss  check sugar two times a day - f/u 1 mo -- if not imp will px metformin  His updated medication list for this problem includes:    Lisinopril 5 Mg Tabs (Lisinopril) .Marland Kitchen... 1 by mouth once daily in am  Complete Medication List: 1)  Xanax 0.5 Mg Tabs (Alprazolam) .... Take 1 tablet by mouth at bedtime 2)  Flexeril 10 Mg Tabs (Cyclobenzaprine hcl) .... Take 1 tablet by mouth three times a day 3)  Crestor 40 Mg Tabs (Rosuvastatin calcium) .Marland Kitchen.. 1 by mouth once daily 4)  Bisoprolol Fumarate 5 Mg Tabs (Bisoprolol fumarate) .Marland Kitchen.. 1 by mouth once daily 5)  Potassium Chloride 20 Meq Pack (Potassium chloride) .... One by mouth daily 6)  Cpap  .Marland Kitchen.. 8cm 7)  Paxil 20 Mg Tabs (Paroxetine hcl) .Marland Kitchen.. 1 by mouth once daily 8)  Ultram 50 Mg Tabs (Tramadol hcl) .... Take by mouth as directed 9)  Lisinopril 5 Mg Tabs (Lisinopril) .Marland Kitchen.. 1 by mouth once daily in am 10)  Hydrocodone-homatropine 5-1.5 Mg/38ml Syrp  (Hydrocodone-homatropine) .Marland Kitchen.. 1 tsp by mouth at bedtime as needed cough 11)  Glucose Test Strips and Lancets  .... To check blood sugar two times a day and as needed for dm that  is not well controlled 250.0  Patient Instructions: 1)  make sure to make your eye doctor appt - you are overdue  2)  I think you need to have some diabetic teaching -- let me know if you change your mind about that  3)  I want you to start following a diabetic diet -- a good book to buy is called sugar busters (at a book store) 4)  start checking sugar in am (before breakfast) then again 2 hours after evening meal-- keep a log  5)  follow up in 1 months with your blood sugar log to review and see if you need medicines  6)  start getting regular exercise  Prescriptions: GLUCOSE TEST STRIPS AND LANCETS to check blood sugar two times a day and as needed for Dm that is not well controlled 250.0  #100 each x 3   Entered and Authorized by:   Judith Part MD   Signed by:   Judith Part MD on 01/20/2009   Method used:   Print then Give to Patient   RxID:   [id number] LISINOPRIL 5 MG TABS (LISINOPRIL) 1 by mouth once daily in am  #30 x 11   Entered and Authorized by:   Judith Part MD   Signed by:   Judith Part MD on 01/20/2009   Method used:   Print then Give to Patient   RxID:   [id number] PAXIL 20 MG TABS (PAROXETINE HCL) 1 by mouth once daily  #30 x 11   Entered and Authorized by:   Judith Part MD   Signed by:   Judith Part MD on 01/20/2009   Method used:   Print then Give to Patient   RxID:   [id number]   Prior Medications (reviewed today): XANAX 0.5 MG TABS (ALPRAZOLAM) Take 1 tablet by mouth at bedtime FLEXERIL 10 MG TABS (CYCLOBENZAPRINE HCL) Take 1 tablet by mouth three times a day CRESTOR 40 MG  TABS (ROSUVASTATIN CALCIUM) 1 by mouth once daily BISOPROLOL FUMARATE 5 MG  TABS (BISOPROLOL FUMARATE) 1 by mouth once daily POTASSIUM CHLORIDE 20 MEQ  PACK (POTASSIUM  CHLORIDE) one by mouth daily CPAP () 8cm PAXIL 20 MG TABS (PAROXETINE HCL) 1 by mouth once daily ULTRAM 50 MG TABS (TRAMADOL HCL) take by mouth as directed LISINOPRIL 5 MG TABS (LISINOPRIL) 1 by mouth once daily in am HYDROCODONE-HOMATROPINE 5-1.5 MG/5ML SYRP (HYDROCODONE-HOMATROPINE) 1 tsp by mouth at bedtime as needed cough GLUCOSE TEST STRIPS AND LANCETS () to check blood sugar two times a day and as needed for Dm that is not well controlled 250.0 Current Allergies: ! PREDNISONE ASA BUSPAR

## 2010-02-15 NOTE — Progress Notes (Signed)
Summary: refill request for xanax  Phone Note Refill Request Message from:  Fax from Pharmacy  Refills Requested: Medication #1:  XANAX 0.5 MG TABS Take 1 tablet by mouth at bedtime   Last Refilled: 12/04/2009 Faxed request from [facility].  [phone].  Pt's son passed "sunday morning.  Initial call taken by: Laurie Branson CMA, AAMA,  January 02, 2010 10:13 AM  Follow-up for Phone Call        tell him I am so very sorry to hear that  my best thoughts to him and his family px written on EMR for call in  Follow-up by: Marne Ann [name] MD,  January 02, 2010 11:30 AM  Additional Follow-up for Phone Call Additional follow up Details #1::        Medication phoned to [facility] as instructed. Unable to reach pt by phone to give condolances. Will try again later..Rena Isley LPN  January 02, 2010 12:48 PM   Patient notified as instructed by telephone. Rena Isley LPN  January 02, 2010 3:17 PM     Prescriptions: XANAX 0.5 MG TABS (ALPRAZOLAM) Take 1 tablet by mouth at bedtime  #30 x 3   Entered and Authorized by:   Marne Ann [name] MD   Signed by:   Rena Isley LPN on 01/02/2010   Method used:   Telephoned to ...       [facility] (retail)       [address]       [address]       [place], [place]  [address]       Ph: [phone]       Fax: [phone]   RxID:   [id number]     Family History: Father: HTN Mother: HTN Siblings: brother died age 85 from MI, kidney cancer sister with cancer  son passed away 01-Jan-2023

## 2010-02-15 NOTE — Progress Notes (Signed)
Summary: refill request for xanax, flexeril  Phone Note Refill Request Message from:  Fax from Pharmacy  Refills Requested: Medication #1:  XANAX 0.5 MG TABS Take 1 tablet by mouth at bedtime   Last Refilled: 08/05/2009  Medication #2:  FLEXERIL 10 MG TABS Take 1 tablet by mouth three times a day Faxed request from [facility], phone [phone]  Initial call taken by: Lowella Petties CMA,  September 05, 2009 1:25 PM  Follow-up for Phone Call        px written on EMR for call in  Follow-up by: Judith Part MD,  September 05, 2009 1:43 PM  Additional Follow-up for Phone Call Additional follow up Details #1::        Rx's called to pharmacy Additional Follow-up by: Linde Gillis CMA [facility]),  September 05, 2009 2:35 PM    Prescriptions: FLEXERIL 10 MG TABS (CYCLOBENZAPRINE HCL) Take 1 tablet by mouth three times a day  #90 x 5   Entered and Authorized by:   Judith Part MD   Signed by:   Linde Gillis CMA (AAMA) on 09/05/2009   Method used:   Telephoned to ...       [facility] (retail)       [address] [place]       [address]       [place], [place]  [address]       Ph: [phone]       Fax: [phone]   RxID:   [id number] XANAX 0.5 MG TABS (ALPRAZOLAM) Take 1 tablet by mouth at bedtime  #30 x 3   Entered and Authorized by:   Judith Part MD   Signed by:   Linde Gillis CMA (AAMA) on 09/05/2009   Method used:   Telephoned to ...       [facility] (retail)       [address] [place]       [address]       [place], [place]  [address]       Ph: [phone]       Fax: [phone]   RxID:   [id number]

## 2010-02-15 NOTE — Progress Notes (Signed)
Summary: refill request for xanax  Phone Note Refill Request Message from:  Fax from Pharmacy  Refills Requested: Medication #1:  XANAX 0.5 MG TABS Take 1 tablet by mouth at bedtime   Last Refilled: 04/19/2009 Faxed request from [facility], [phone].  Initial call taken by: Lowella Petties CMA,  May 15, 2009 4:21 PM  Follow-up for Phone Call        px written on EMR for call in  Follow-up by: Judith Part MD,  May 15, 2009 4:31 PM  Additional Follow-up for Phone Call Additional follow up Details #1::        Medication phoned to pharmacy.  Additional Follow-up by: Delilah Shan CMA [facility]),  May 15, 2009 5:01 PM    New/Updated Medications: XANAX 0.5 MG TABS (ALPRAZOLAM) Take 1 tablet by mouth at bedtime Prescriptions: XANAX 0.5 MG TABS (ALPRAZOLAM) Take 1 tablet by mouth at bedtime  #30 x 3   Entered and Authorized by:   Judith Part MD   Signed by:   Delilah Shan CMA (AAMA) on 05/15/2009   Method used:   Telephoned to ...       [facility] (retail)       [address] [place]       [address]       [place], [place]  [address]       Ph: [phone]       Fax: [phone]   RxID:   [id number]

## 2010-02-15 NOTE — Letter (Signed)
Summary: [facility]  [facility]   Imported By: Lanelle Bal 06/20/2009 10:32:33  _____________________________________________________________________  External Attachment:    Type:   Image     Comment:   External Document

## 2010-02-15 NOTE — Letter (Signed)
Summary: [facility]  [facility]   Imported By: Sherian Rein 09/29/2009 09:45:15  _____________________________________________________________________  External Attachment:    Type:   Image     Comment:   External Document

## 2010-02-15 NOTE — Progress Notes (Signed)
Summary: Rx Alprazolam  Phone Note Refill Request Call back at [phone] Message from:  [facility] on January 19, 2009 2:27 PM  Refills Requested: Medication #1:  XANAX 0.5 MG TABS Take 1 tablet by mouth at bedtime   Last Refilled: 12/22/2008 Received faxed refill request, please advise   Method Requested: Telephone to Pharmacy Initial call taken by: Linde Gillis CMA Duncan Dull),  January 19, 2009 2:28 PM  Follow-up for Phone Call        px written on EMR for call in  Follow-up by: Judith Part MD,  January 19, 2009 2:50 PM  Additional Follow-up for Phone Call Additional follow up Details #1::        Rx called to pharmacy Additional Follow-up by: Linde Gillis CMA [facility] Dull),  January 19, 2009 3:15 PM    Prescriptions: XANAX 0.5 MG TABS (ALPRAZOLAM) Take 1 tablet by mouth at bedtime  #30 x 3   Entered and Authorized by:   Judith Part MD   Signed by:   Linde Gillis CMA (AAMA) on 01/19/2009   Method used:   Telephoned to ...         RxID:   [id number]

## 2010-02-19 ENCOUNTER — Encounter (INDEPENDENT_AMBULATORY_CARE_PROVIDER_SITE_OTHER): Payer: Self-pay | Admitting: *Deleted

## 2010-02-19 ENCOUNTER — Other Ambulatory Visit (INDEPENDENT_AMBULATORY_CARE_PROVIDER_SITE_OTHER): Payer: MEDICARE

## 2010-02-19 ENCOUNTER — Other Ambulatory Visit: Payer: Self-pay | Admitting: Family Medicine

## 2010-02-19 DIAGNOSIS — E119 Type 2 diabetes mellitus without complications: Secondary | ICD-10-CM

## 2010-02-19 LAB — RENAL FUNCTION PANEL
Calcium: 9.3 mg/dL (ref 8.4–10.5)
Creatinine, Ser: 0.8 mg/dL (ref 0.4–1.5)
Glucose, Bld: 134 mg/dL — ABNORMAL HIGH (ref 70–99)
Phosphorus: 3 mg/dL (ref 2.3–4.6)
Potassium: 4.4 mEq/L (ref 3.5–5.1)
Sodium: 141 mEq/L (ref 135–145)

## 2010-02-19 LAB — HEMOGLOBIN A1C: Hgb A1c MFr Bld: 6.7 % — ABNORMAL HIGH (ref 4.6–6.5)

## 2010-02-26 ENCOUNTER — Encounter: Payer: Self-pay | Admitting: Family Medicine

## 2010-02-26 ENCOUNTER — Ambulatory Visit (INDEPENDENT_AMBULATORY_CARE_PROVIDER_SITE_OTHER): Payer: MEDICARE | Admitting: Family Medicine

## 2010-02-26 DIAGNOSIS — F3289 Other specified depressive episodes: Secondary | ICD-10-CM | POA: Insufficient documentation

## 2010-02-26 DIAGNOSIS — F411 Generalized anxiety disorder: Secondary | ICD-10-CM

## 2010-02-26 DIAGNOSIS — E119 Type 2 diabetes mellitus without complications: Secondary | ICD-10-CM

## 2010-02-26 DIAGNOSIS — I10 Essential (primary) hypertension: Secondary | ICD-10-CM

## 2010-02-26 DIAGNOSIS — F329 Major depressive disorder, single episode, unspecified: Secondary | ICD-10-CM

## 2010-03-13 NOTE — Assessment & Plan Note (Signed)
Summary: FOLLOW-UP/[facility]H   Vital Signs:  Patient profile:   76 year old male Height:      68 inches Weight:      247 pounds BMI:     37.69 Temp:     97.8 degrees F oral Pulse rate:   60 / minute Pulse rhythm:   regular BP sitting:   116 / 66  (left arm) Cuff size:   large  Vitals Entered By: Lewanda Rife LPN (February 26, 2010 2:10 PM) CC: six month f/u also pt wants to increase how often can take Xanax, since the death of his son in Jan 10, 2023 pt said he is not sleeping and not eating.   History of Present Illness: here for f/u of HTN and DM and anxiety  wt is up 5 lb with bmi of37 not as careful about what he eats due to depression   hard to exercise due to orthopedic problems   anxiety is worse since sun died 01-09-23 eating less  and not sleeping has good days and bad days - very variable  is more depression than anxiety on paxil  on xanax  talks to his wife when down or lonely is not open to grief counseling at this time   AIC is 6.7 up from 6.1   diet controlled -- is watching her sugars -- 130s in the am , not in the afteroon  opthy up to date  lipids good with statin  HTN in very good control 116/66    Allergies: 1)  ! 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) SNORING (ICD-786.09) FATIGUE (ICD-780.79) HYPOPOTASSEMIA (ICD-276.8) SPECIAL SCREENING MALIGNANT NEOPLASM OF PROSTATE (ICD-V76.44) LEG CRAMPS (ICD-729.82) CARPAL TUNNEL SYNDROME (ICD-354.0) OSTEOARTHRITIS, HANDS, BILATERAL (ICD-715.94) PALPITATIONS, HX OF (ICD-V12.50) SHOULDER PAIN, LEFT, CHRONIC (ICD-719.41) HYPERCHOLESTEROLEMIA (ICD-272.0) PEPTIC ULCER DISEASE (ICD-533.90) NEPHROLITHIASIS, HX OF (ICD-V13.01) DIABETES MELLITUS, TYPE II (ICD-250.00) ANXIETY (ICD-300.00)   urology-- Dr Vonita Moss [facility]- [facility]  Past Surgical History: Last updated: 11/20/2008 Back surgery x 2-  disc Right knee arthroscopic surgery (1998) Left shoulder injection- rotator cuff (08/1995) Cardiac cath- mod CAD- single vessel (01/1998) Treadmill- positive (Jan 09, 1998) Colonoscopy- polyps (02/1998) EGD with dilation- esophagitis (02/1998) Refuses follow up colonoscopy 10/10 R foot fracture   Family History: Last updated: 01/02/2010 Father: HTN Mother: HTN Siblings: brother died age 34 from MI, kidney cancer sister with cancer  son passed away Jan 09, 2023  Social History: Last updated: 10/27/2007 Marital Status: Married Children: 2 Occupation: retired quit smoking 1995 Former Smoker no alcohol   Risk Factors: Smoking Status: quit (01/07/2007)  Review of Systems General:  Complains of fatigue; denies chills and fever. Eyes:  Denies blurring and eye irritation. CV:  Denies chest pain or discomfort, lightheadness, and palpitations. Resp:  Denies cough, shortness of breath, and wheezing. GI:  Denies change in bowel habits, indigestion, and nausea. MS:  Complains of joint pain, low back pain, and stiffness; denies muscle weakness. Derm:  Denies itching, lesion(s), poor wound healing, and rash. Neuro:  Denies headaches, numbness, and tingling. Psych:  Complains of anxiety and depression; denies panic attacks, sense of great danger, and suicidal thoughts/plans. Endo:  Denies cold intolerance, excessive thirst, excessive urination, and heat intolerance. Heme:  Denies abnormal bruising and bleeding.  Physical Exam  General:  obese and well appearing  Head:  normocephalic, atraumatic, and no abnormalities observed.   Eyes:  vision grossly intact, pupils equal, pupils round, and pupils reactive to  light.  no conjunctival pallor, injection or icterus  Mouth:  pharynx pink and moist.   Neck:  supple with full rom and no masses or thyromegally, no JVD or carotid bruit  Chest Wall:  No deformities, masses, tenderness or gynecomastia noted. Lungs:  Normal respiratory effort, chest  expands symmetrically. Lungs are clear to auscultation, no crackles or wheezes. diffusely distant bs  Heart:  Normal rate and regular rhythm. S1 and S2 normal without gallop, murmur, click, rub or other extra sounds. Abdomen:  Bowel sounds positive,abdomen soft and non-tender without masses, organomegaly or hernias noted. no renal bruits  Msk:  No deformity or scoliosis noted of thoracic or lumbar spine.  no new joint changes  Pulses:  R and L carotid,radial,femoral,dorsalis pedis and posterior tibial pulses are full and equal bilaterally Extremities:  No clubbing, cyanosis, edema, or deformity noted with normal full range of motion of all joints.   Neurologic:  sensation intact to light touch, gait normal, and DTRs symmetrical and normal.   Skin:  Intact without suspicious lesions or rashes Cervical Nodes:  No lymphadenopathy noted Psych:  more anxious and down today eye contact is fair talks freely about his symptoms   Diabetes Management Exam:    Foot Exam (with socks and/or shoes not present):       Sensory-Pinprick/Light touch:          Left medial foot (L-4): normal          Left dorsal foot (L-5): normal          Left lateral foot (S-1): normal          Right medial foot (L-4): normal          Right dorsal foot (L-5): normal          Right lateral foot (S-1): normal       Sensory-Monofilament:          Left foot: normal          Right foot: normal       Inspection:          Left foot: normal          Right foot: normal       Nails:          Left foot: normal          Right foot: normal   Impression & Recommendations:  Problem # 1:  DEPRESSION (ICD-311) Assessment New with grief rxn and anxiety pt has good support at homeand is not interested in counseling at this time will inc paxil to 40  inc xanax to two times a day only if he needs it red flags discussed- esp if worse f/u 6 wk exercise as tolerated His updated medication list for this problem includes:    Xanax  0.5 Mg Tabs (Alprazolam) .Marland Kitchen... Take 1 tablet by mouth two times a day as needed anxiety    Paxil 40 Mg Tabs (Paroxetine hcl) .Marland Kitchen... 1 by mouth once daily  Problem # 2:  ESSENTIAL HYPERTENSION, BENIGN (ICD-401.1) Assessment: Unchanged  this is well controlled without change  continue meds labs ref His updated medication list for this problem includes:    Bisoprolol Fumarate 5 Mg Tabs (Bisoprolol fumarate) .Marland Kitchen... 1 by mouth once daily    Lisinopril 5 Mg Tabs (Lisinopril) .Marland Kitchen... 1 by mouth once daily in am  BP today: 116/66 Prior BP: 130/80 (01/10/2010)  Labs Reviewed: K+: 4.4 (02/19/2010) Creat: : 0.8 (02/19/2010)   Chol: 93 (08/21/2009)  HDL: 37.60 (08/21/2009)   LDL: 37 (08/21/2009)   TG: 94.0 (08/21/2009)  Problem # 3:  DIABETES MELLITUS, TYPE II (ICD-250.00) Assessment: Deteriorated  worse with higher sugar diet prompted by depression will work on that  strategy made  His updated medication list for this problem includes:    Lisinopril 5 Mg Tabs (Lisinopril) .Marland Kitchen... 1 by mouth once daily in am  Labs Reviewed: Creat: 0.8 (02/19/2010)     Last Eye Exam: normal (07/25/2009) Reviewed HgBA1c results: 6.7 (02/19/2010)  6.1 (08/21/2009)  Complete Medication List: 1)  Xanax 0.5 Mg Tabs (Alprazolam) .... Take 1 tablet by mouth two times a day as needed anxiety 2)  Flexeril 10 Mg Tabs (Cyclobenzaprine hcl) .... Take 1 tablet by mouth three times a day 3)  Crestor 40 Mg Tabs (Rosuvastatin calcium) .Marland Kitchen.. 1 by mouth once daily 4)  Bisoprolol Fumarate 5 Mg Tabs (Bisoprolol fumarate) .Marland Kitchen.. 1 by mouth once daily 5)  Potassium Chloride 20 Meq Pack (Potassium chloride) .... One by mouth daily 6)  Cpap  .Marland Kitchen.. 8cm 7)  Paxil 40 Mg Tabs (Paroxetine hcl) .Marland Kitchen.. 1 by mouth once daily 8)  Ultram 50 Mg Tabs (Tramadol hcl) .... Take by mouth as directed 9)  Lisinopril 5 Mg Tabs (Lisinopril) .Marland Kitchen.. 1 by mouth once daily in am 10)  Hydrocodone-homatropine 5-1.5 Mg/36ml Syrp (Hydrocodone-homatropine) .Marland Kitchen.. 1  tsp by mouth at bedtime as needed cough 11)  Glucose Test Strips and Lancets  .... To check blood sugar two times a day and as needed for dm that is not well controlled 250.0 12)  Glucometer- [employer]  .... To check glucose once daily and as needed for dm 250.0  Patient Instructions: 1)  increase your paxil from 20 to 40 mg once daily for depression  2)  also you can take xanax up to two times a day as needed anxiety  3)  update me if you get worse/ feel suicidal or have side effects  4)  work harder on low sugar diet  5)  follow up with me in about 6 weeks  Prescriptions: XANAX 0.5 MG TABS (ALPRAZOLAM) Take 1 tablet by mouth two times a day as needed anxiety  #60 x 1   Entered and Authorized by:   Judith Part MD   Signed by:   Judith Part MD on 02/26/2010   Method used:   Print then Give to Patient   RxID:   [id number] PAXIL 40 MG TABS (PAROXETINE HCL) 1 by mouth once daily  #30 x 11   Entered and Authorized by:   Judith Part MD   Signed by:   Judith Part MD on 02/26/2010   Method used:   Print then Give to Patient   RxID:   [id number]    Orders Added: 1)  Est. Patient Level IV [95284]    Current Allergies (reviewed today): ! PREDNISONE ASA BUSPAR

## 2010-03-26 ENCOUNTER — Telehealth: Payer: Self-pay | Admitting: Family Medicine

## 2010-04-03 NOTE — Progress Notes (Signed)
Summary: flexeril   Phone Note Refill Request Message from:  Fax from Pharmacy on March 26, 2010 4:15 PM  Refills Requested: Medication #1:  FLEXERIL 10 MG TABS Take 1 tablet by mouth three times a day   Last Refilled: 02/01/2010 REfill request from  [facility]. [phone]  Initial call taken by: Melody Comas,  March 26, 2010 4:16 PM  Follow-up for Phone Call        px written on EMR for call in  Follow-up by: Judith Part MD,  March 26, 2010 5:21 PM  Additional Follow-up for Phone Call Additional follow up Details #1::        Medication phoned to [facility] pharmacy as instructed. Lewanda Rife LPN  March 26, 2010 5:24 PM     Prescriptions: FLEXERIL 10 MG TABS (CYCLOBENZAPRINE HCL) Take 1 tablet by mouth three times a day  #90 x 5   Entered and Authorized by:   Judith Part MD   Signed by:   Lewanda Rife LPN on 11/91/4782   Method used:   Telephoned to ...       [facility] (retail)       [address] [place]       [address]       [place], [place]  [address]       Ph: [phone]       Fax: [phone]   RxID:   [id number]

## 2010-04-10 ENCOUNTER — Ambulatory Visit: Payer: MEDICARE | Admitting: Family Medicine

## 2010-05-05 ENCOUNTER — Encounter: Payer: Self-pay | Admitting: Family Medicine

## 2010-05-09 ENCOUNTER — Other Ambulatory Visit: Payer: Self-pay | Admitting: *Deleted

## 2010-05-09 MED ORDER — ALPRAZOLAM 0.5 MG PO TABS: 0.5000 mg | ORAL_TABLET | Freq: Two times a day (BID) | ORAL | Status: DC | PRN

## 2010-05-09 NOTE — Telephone Encounter (Signed)
Px written for call in   

## 2010-05-09 NOTE — Telephone Encounter (Signed)
Rx phoned to pharmacy.  

## 2010-05-11 ENCOUNTER — Encounter: Payer: Self-pay | Admitting: Family Medicine

## 2010-05-11 ENCOUNTER — Ambulatory Visit (INDEPENDENT_AMBULATORY_CARE_PROVIDER_SITE_OTHER): Payer: Medicare Other | Admitting: Family Medicine

## 2010-05-11 DIAGNOSIS — I10 Essential (primary) hypertension: Secondary | ICD-10-CM

## 2010-05-11 DIAGNOSIS — F329 Major depressive disorder, single episode, unspecified: Secondary | ICD-10-CM

## 2010-05-11 DIAGNOSIS — E119 Type 2 diabetes mellitus without complications: Secondary | ICD-10-CM

## 2010-05-11 DIAGNOSIS — E7800 Pure hypercholesterolemia, unspecified: Secondary | ICD-10-CM

## 2010-05-11 LAB — LIPID PANEL
Cholesterol: 121 mg/dL (ref 0–200)
LDL Cholesterol: 45 mg/dL (ref 0–99)
Triglycerides: 159 mg/dL — ABNORMAL HIGH (ref 0.0–149.0)
VLDL: 31.8 mg/dL (ref 0.0–40.0)

## 2010-05-11 LAB — ALT: ALT: 22 U/L (ref 0–53)

## 2010-05-11 NOTE — Assessment & Plan Note (Signed)
Doing much better with paxil  No change - enc exercise and staying social

## 2010-05-11 NOTE — Assessment & Plan Note (Signed)
Very well controlled 120/72 No change in med F/u 6 mo

## 2010-05-11 NOTE — Patient Instructions (Signed)
Labs today Keep working on diet and exercise and weight loss [employer] company and find out what muscle relaxer is more affordible than the flexeril  Call me and let me know what ones are covered so I can px it  Make sure to re schedule your eye exam  Follow up with me in about 6 months for a 30 minute check up with labs prior

## 2010-05-11 NOTE — Progress Notes (Signed)
Subjective:    Patient ID: Scott Gomez, male    DOB: January 01, 1935, 76 y.o.   MRN: [id number]  HPI Here for f/uof DM and lipids and HTN and depression  Is feeling ok  Nothing new medically   Wt is down 5 lb - is eating better "stays on his diet"  Got some exercise equiptment -- rowing machine -- and also working in garden and out in the lawn mowing   HTN well controlled with 120/72 today No problems with meds No edema or headaches  Lipids are due to check on crestor Low sat fat diet - no fatty foods or junk food   Depression - on paxil 40  Last time a1c went up fro 6.1 to 6.7  Is doing a good job working on that  Watching sugar in diet --uses artificial sweetener when he needs it  No numbness  Is due for eye exam  Just skipped his last eye appt -- had cataract removal   Flexeril no longer covered- needs another muscle relaxer   Past Medical History  Diagnosis Date  . Testosterone deficiency   . Snoring   . Fatigue   . Hypopotassemia   . Leg cramps   . Carpal tunnel syndrome   . Arthritis     osteoarthritis both hands  . Palpitations     Hx of   . Shoulder pain, left   . Hypercholesteremia   . Ulcer     peptic ulcer disease  . Nephrolithiasis     Hx of  . Diabetes mellitus     type II  . Anxiety    Past Surgical History  Procedure Date  . Back surgery     x2 disc    reports that he quit smoking about 17 years ago. He does not have any smokeless tobacco history on file. He reports that he does not drink alcohol. His drug history not on file. family history includes Cancer in his brother and sister; Heart disease in his brother; and Hypertension in his father and mother. Allergies  Allergen Reactions  . Aspirin     REACTION: GI upset  . Buspirone Hcl     REACTION: itching  . Prednisone     REACTION: sick        Review of Systems Review of Systems  Constitutional: Negative for fever, appetite change, fatigue and unexpected weight change.  Eyes:  Negative for pain and visual disturbance.  Respiratory: Negative for cough and shortness of breath.   Cardiovascular: Negative.   Gastrointestinal: Negative for nausea, diarrhea and constipation.  Genitourinary: Negative for urgency and frequency.  Skin: Negative for pallor.  Neurological: Negative for weakness, light-headedness, numbness and headaches.  Hematological: Negative for adenopathy. Does not bruise/bleed easily.  Psychiatric/Behavioral: Negative for dysphoric mood. The patient is not nervous/anxious.         Objective:   Physical Exam  Constitutional: He appears well-developed and well-nourished.       overwt and well appearing    HENT:  Head: Normocephalic and atraumatic.  Eyes: Conjunctivae and EOM are normal. Pupils are equal, round, and reactive to light.  Neck: Normal range of motion. Neck supple. No JVD present. Carotid bruit is not present. No thyromegaly present.  Cardiovascular: Normal rate, regular rhythm and normal heart sounds.   Pulmonary/Chest: Effort normal and breath sounds normal. He has no wheezes. He has no rales.  Abdominal: Soft. Bowel sounds are normal. He exhibits no abdominal bruit.  Musculoskeletal: Normal range of  motion. He exhibits no edema and no tenderness.  Lymphadenopathy:    He has no cervical adenopathy.  Neurological: He is alert. He displays normal reflexes. No cranial nerve deficit.  Skin: Skin is warm and dry. No rash noted. No erythema.  Psychiatric: He has a normal mood and affect.          Assessment & Plan:

## 2010-05-11 NOTE — Assessment & Plan Note (Signed)
Due for check today On low sat fat diet and crestor- doing well  Adv with results  F/u 6 mo

## 2010-05-11 NOTE — Assessment & Plan Note (Signed)
This was worse last check but now with imp diet and exercise and wt loss exp better a1c Pt will re sched eye exam- disc imp of this  Nl foot exam  F/u 16mo

## 2010-05-29 NOTE — Procedures (Signed)
NAME:  ENGLISH, CRAIGHEAD NO.:  [id number]   MEDICAL RECORD NO.:  [id number]          PATIENT TYPE:  OUT   LOCATION:  SLEEP CENTER                 FACILITY[facility]  [facility]   PHYSICIAN:  Oretha Milch, MD      DATE OF BIRTH:  20-Jul-1934   DATE OF STUDY:  27-Dec-202009                            NOCTURNAL POLYSOMNOGRAM   REFERRING PHYSICIAN:   INDICATION FOR STUDY:  Mr. Luft is a 76 year old overweight gentleman  with excessive daytime somnolence and loud snoring.  His BMI is 39 with  a weight of 250 pounds, height 5 feet 7 inches, neck size 17 inches.   EPWORTH SLEEPINESS SCORE:  7/24.   MEDICATIONS:  Flexeril, bisoprolol, alprazolam, tramadol, potassium and  Crestor.   This overnight polysomnogram was performed in the form of a split study  with the sleep technologist in attendance.  EEG, EOG, EMG, EKG, and  respiratory data were recorded.  Sleep stages, arousals, limb movements  and respiratory parameters were scored according to critical laid out by  the [employer].   SLEEP ARCHITECTURE:  During the baseline portion of 271 minutes, 217.5  minutes of sleep was recorded with sleep efficiency of 80.3%.  Lights  out was at 9:31 p.m. and lights on was at 5:30 a.m.  Wake after sleep  onset was 30 minutes.  Sleep stages of the percentage after all sleep  time was 5.5% in 1 and 94.5% in 2.  No slow wave or REM sleep was  recorded.  No supine sleep was noted.  Due to respiratory disturbance,  CPAP was initiated at 2:02 a.m.  Long periods of REM sleep were noted  notably around 2:15 a.m. and again around 5:00 a.m.  There was 69  minutes (38.3%) of REM sleep recorded during the titration portion.   RESPIRATORY DATA:  During the baseline portion, 11 obstructive apneas, 0  central apneas, 0 mixed hypopneas, 46 hypopneas and 37 RERAs were  recorded leading to an apnea hypopnea index of 15.7 events per hours.  The longest apnea duration was 44.4 seconds and  the longest hypopnea was  42.6 seconds.  The RERA index was 10.2 events per hour.  Due to this  degree of respiratory disturbance, CPAP was initiated at 4 cm and  titrated to a final level of 8 cm.  At a level of 5 cm for 23.5 minutes  he achieved 11 minutes of sleep including 7.5 minutes of REM sleep.  No  events were noted and the lowest desaturation was 93%.   The optimal level of CPAP appears to be 8 cm.  At this level for 149.5  minutes, he achieved 136 minutes of sleep with 32 minutes of REM sleep,  1 central apnea was noted with 14 arousals and the lowest desaturation  of 94%.   AROUSAL DATA:  During the diagnostic portion, 133 arousals were noted of  which 58 were spontaneous, 50 were associated with respiratory events  and 25 were associated with limb movements.  During the final level of 8  cm, 14 arousals were noted.   OXYGEN DATA:  The lowest desaturation during the diagnostic portion  was  88%.  During the final level of 8 cm, the lowest desaturation was 94%.   CARDIAC DATA:  During the diagnostic portion, the lowest heart rate was  35 beats per minute.  The high heart rate was artifactual.  This did not  change much during the titration portion.   MOVEMENT-PARASOMNIA:  During the diagnostic portion, periodic limb  movement index was 67 events per hour with PLM arousal of 6.6 events per  hour.  PLMs were abolished with CPAP.   DISCUSSION:  Snoring level was 3. CPAP was titrated due to snoring.  A  medium sized Mirage Quattro mask was used.  He seemed to tolerate this  well and woke up refreshed after the study.   IMPRESSION/RECOMMENDATIONS:  1. Moderate obstructive sleep apnea with predominant hypopneas and      RERAs causing desaturation and sleep fragmentation.  2. This was corrected by CPAP of 8 cm with a full face mask.  3. Severe periodic limb movements and arousals were noted which were      corrected by CPAP.  No cardiac arrhythmias or disturbance during       sleep was noted.   RECOMMENDATIONS:  1. I would initiate CPAP with a full face mask at 8 cm and monitor      compliance.  2. He should be asked to avoid medications with sedative side effects.      He should be asked to avoid driving when sleepy.  3. PLMs were obliterated with CPAP suggesting that they were likely      related to obstructive sleep apnea.      Oretha Milch, MD  Electronically Signed     RVA/MEDQ  D:  08/05/2007 10:39:08  T:  08/05/2007 11:55:40  Job:  [id number]

## 2010-05-30 ENCOUNTER — Ambulatory Visit (INDEPENDENT_AMBULATORY_CARE_PROVIDER_SITE_OTHER): Payer: Medicare Other | Admitting: Family Medicine

## 2010-05-30 ENCOUNTER — Encounter: Payer: Self-pay | Admitting: Family Medicine

## 2010-05-30 DIAGNOSIS — E119 Type 2 diabetes mellitus without complications: Secondary | ICD-10-CM

## 2010-05-30 DIAGNOSIS — E7800 Pure hypercholesterolemia, unspecified: Secondary | ICD-10-CM

## 2010-05-30 NOTE — Assessment & Plan Note (Signed)
Pt has noticed a substantial imp in sugar readings off crestor  We disc this in length Rev sugar log Rev diet- is good Enc to continue DM diet/exercise and wt loss  Stay off crestor for now a1c and f/u 3 mo  If no imp will need to disc metformin

## 2010-05-30 NOTE — Assessment & Plan Note (Signed)
Holding crestor for high sugar (pt is convinced this is the cause) Check lipid in 3 mo before f/u and will review  Disc low sat fat diet - doing well with that Aware of risks of high chol

## 2010-05-30 NOTE — Progress Notes (Signed)
  Subjective:    Patient ID: NASHAWN HILLOCK, male    DOB: Sep 04, 1934, 76 y.o.   MRN: [id number]  HPI Here for  f/u of DM Last visit -was doing well with diet and exercise  Surprised by a1c of 7.5 (had been prev 6.7 diet controlled)  Pt thinks his crestor made sugar go up   When on the crestor 140s-150s in am and 120s-150s in afternoon   Off of crestor 120s and below in am ,   And then in afternoons probably avg 130s  Does see a difference off the crestor  No sweets or fats in diet  Got rid of bread  Is sure his meter is accurate   Wt is down 3 lb   Past Medical History  Diagnosis Date  . Testosterone deficiency   . Snoring   . Fatigue   . Hypopotassemia   . Leg cramps   . Carpal tunnel syndrome   . Arthritis     osteoarthritis both hands  . Palpitations     Hx of   . Shoulder pain, left   . Hypercholesteremia   . Ulcer     peptic ulcer disease  . Nephrolithiasis     Hx of  . Diabetes mellitus     type II  . Anxiety     History   Social History  . Marital Status: Married    Spouse Name: N/A    Number of Children: N/A  . Years of Education: N/A   Occupational History  . Not on file.   Social History Main Topics  . Smoking status: Former Smoker    Quit date: 01/14/1993  . Smokeless tobacco: Not on file  . Alcohol Use: No  . Drug Use:   . Sexually Active:    Other Topics Concern  . Not on file   Social History Narrative  . No narrative on file      Review of Systems Review of Systems  Constitutional: Negative for fever, appetite change, fatigue and unexpected weight change.  Eyes: Negative for pain and visual disturbance.  Respiratory: Negative for cough and shortness of breath.   Cardiovascular: Negative.   Gastrointestinal: Negative for nausea, diarrhea and constipation.  Genitourinary: Negative for urgency and frequency.  Skin: Negative for pallor.  Neurological: Negative for weakness, light-headedness, numbness and headaches.    Hematological: Negative for adenopathy. Does not bruise/bleed easily.  Psychiatric/Behavioral: Negative for dysphoric mood. The patient is not nervous/anxious.          Objective:   Physical Exam  Constitutional: He appears well-developed and well-nourished.  HENT:  Head: Normocephalic and atraumatic.  Eyes: Conjunctivae and EOM are normal. Pupils are equal, round, and reactive to light.  Neck: Normal range of motion. Neck supple. Carotid bruit is not present. No thyromegaly present.  Cardiovascular: Normal rate and regular rhythm.   Pulmonary/Chest: Effort normal and breath sounds normal. No respiratory distress. He has no wheezes.  Abdominal: Soft. Bowel sounds are normal.  Musculoskeletal: He exhibits no edema.  Lymphadenopathy:    He has no cervical adenopathy.  Neurological: He is alert. He has normal reflexes.  Skin: Skin is warm and dry. No erythema. No pallor.  Psychiatric: He has a normal mood and affect.          Assessment & Plan:

## 2010-05-30 NOTE — Patient Instructions (Signed)
Stay off the crestor  Watch diet for fats and sugars very carefully Exercise 5 days a week if you can  Keep loosing weight  Schedule lab and follow up in 3 months  Stay off the crestor for now

## 2010-06-01 NOTE — Assessment & Plan Note (Signed)
[facility]                              CARDIOLOGY OFFICE NOTE   NAME:Rowand, ARNAV CREGG                     MRN:          [id number]  DATE:09/09/2005                            DOB:          [birth year]    IDENTIFICATION:  Scott Gomez is a 76 year old gentleman with history of  palpitations.  I saw him back in 2005 and have not seen him since.  He comes  in today to get his medicines refilled.   In the interval, he said he has done well.  He denies palpitations, no  dizziness, no chest pain, no change in his ability to do things.  He says  his cramping in his left thigh limits his walking.   CURRENT MEDICATIONS:  1. Maxzide 25/37.5 q. day.  2. Vytorin 10/40 q. day.  3. bisoprolol 5 q. day.  4. Alprazolam 0.5 nightly.   PHYSICAL EXAMINATION:  The patient is in no distress.  Blood pressure  148/82.  Pulse is 52 and regular.  Weight 238.  NECK:  No bruits.  LUNGS:  Clear.  CARDIAC EXAM:  Regular rate and rhythm, S1 S2, no S3, no skips, no murmurs.  ABDOMEN:  Benign.  EXTREMITIES:  Good dorsalis pedis and posterior tibial pulses.  No edema.   IMPRESSION:  1. Palpitations.  Again seeing back in the past.  Currently seem to be      under control with bisoprolol.  Would continue this.  I have refilled      his Maxzide.  2. Hypertension.  Good control.  Again Maxzide refilled.  3. Erectile dysfunction.  The patient requests a refill on Viagra.  I told      him Dr. Milinda Antis should do this, but he wanted to have it now so he would      not need an appointment.  Will go ahead and give him a nonrefillable      prescription.  In the future, he should seek this with Dr. Milinda Antis.   I have not set a definite followup.  Again, his symptoms are under good  control.  He is without complaints.  Will only follow up as needed.                                Pricilla Riffle, MD, [facility]    PVR/MedQ  DD:  09/09/2005  DT:  09/10/2005  Job #:  [id number]   cc:   Marne  A. Milinda Antis, MD

## 2010-06-19 ENCOUNTER — Other Ambulatory Visit: Payer: Self-pay | Admitting: *Deleted

## 2010-06-19 MED ORDER — LANCETS MISC: Status: DC

## 2010-07-20 ENCOUNTER — Other Ambulatory Visit: Payer: Self-pay | Admitting: *Deleted

## 2010-07-20 MED ORDER — ALPRAZOLAM 0.5 MG PO TABS: 0.5000 mg | ORAL_TABLET | Freq: Two times a day (BID) | ORAL | Status: DC | PRN

## 2010-07-20 NOTE — Telephone Encounter (Signed)
Px written for call in   

## 2010-07-20 NOTE — Telephone Encounter (Signed)
Called to [facility].

## 2010-08-16 ENCOUNTER — Other Ambulatory Visit: Payer: Self-pay

## 2010-08-16 MED ORDER — POTASSIUM CHLORIDE 20 MEQ PO PACK: 20.0000 meq | PACK | Freq: Every day | ORAL | Status: DC

## 2010-08-16 NOTE — Telephone Encounter (Signed)
[facility] pharmacy faxed refill request for Potassium Chl #30 x3 refills sent electronically.

## 2010-08-23 ENCOUNTER — Other Ambulatory Visit (INDEPENDENT_AMBULATORY_CARE_PROVIDER_SITE_OTHER): Payer: Medicare Other

## 2010-08-23 DIAGNOSIS — E7800 Pure hypercholesterolemia, unspecified: Secondary | ICD-10-CM

## 2010-08-23 DIAGNOSIS — E119 Type 2 diabetes mellitus without complications: Secondary | ICD-10-CM

## 2010-08-23 LAB — HEMOGLOBIN A1C: Hgb A1c MFr Bld: 6.3 % (ref 4.6–6.5)

## 2010-08-23 LAB — LIPID PANEL: VLDL: 36.8 mg/dL (ref 0.0–40.0)

## 2010-08-31 ENCOUNTER — Encounter: Payer: Self-pay | Admitting: Family Medicine

## 2010-08-31 ENCOUNTER — Ambulatory Visit (INDEPENDENT_AMBULATORY_CARE_PROVIDER_SITE_OTHER): Payer: Medicare Other | Admitting: Family Medicine

## 2010-08-31 DIAGNOSIS — E7800 Pure hypercholesterolemia, unspecified: Secondary | ICD-10-CM

## 2010-08-31 DIAGNOSIS — E119 Type 2 diabetes mellitus without complications: Secondary | ICD-10-CM

## 2010-08-31 DIAGNOSIS — I10 Essential (primary) hypertension: Secondary | ICD-10-CM

## 2010-08-31 NOTE — Patient Instructions (Signed)
Keep up the healthy diet  Try to get regular exercise that does not hurt your leg (like water exercise or lifting hand weights) Outdoor and housework is also good  Sugar control is better  Cholesterol is up as expected so watch diet for that (Avoid red meat/ fried foods/ egg yolks/ fatty breakfast meats/ butter, cheese and high fat dairy/ and shellfish  )  Follow up in about 6 months for annual 30 minute check up with labs prior We will refer you for eye doctor appt at check out

## 2010-08-31 NOTE — Progress Notes (Signed)
Subjective:    Patient ID: Scott Gomez, male    DOB: 06/07/1934, 76 y.o.   MRN: [id number]  HPI Here for f/u of DM and lipids   Wt is down 7 lb Last visit pt thought crestor was inc his sugars Is eating better overall - and proud of that - tomato and veg from the garden  Buys products with zero sugar This a1c much better off of it at 6.3 Now in hyperglycemic range  Is diet controlled  On ace   Exercise is difficult- arthritis in R knee   Needs appt with eye doctor for yearly exam   Feels a bit less motivated lately  Is lazy  Wants to sleep late  Does not feel depressed   bp good at 118/62 No cp or palpitations or ha   Lipids are up as expected LDL direct is 148 Diet - eats a lot of chicken and avoids red meat and fried foods  Lab Results  Component Value Date   CHOL 216* 08/23/2010   CHOL 121 05/11/2010   CHOL 93 08/21/2009   Lab Results  Component Value Date   HDL 45.80 08/23/2010   HDL 43.80 05/11/2010   HDL 40.98* 08/21/2009   Lab Results  Component Value Date   LDLCALC 45 05/11/2010   LDLCALC 37 08/21/2009   LDLCALC 38 01/16/2009   Lab Results  Component Value Date   TRIG 184.0* 08/23/2010   TRIG 159.0* 05/11/2010   TRIG 94.0 08/21/2009   Lab Results  Component Value Date   CHOLHDL 5 08/23/2010   CHOLHDL 3 05/11/2010   CHOLHDL 2 08/21/2009   Lab Results  Component Value Date   LDLDIRECT 148.3 08/23/2010    Patient Active Problem List  Diagnoses  . DIABETES MELLITUS, TYPE II  . TESTOSTERONE DEFICIENCY  . HYPERCHOLESTEROLEMIA  . ANXIETY  . CARPAL TUNNEL SYNDROME  . ESSENTIAL HYPERTENSION, BENIGN  . C O P D  . PEPTIC ULCER DISEASE  . OSTEOARTHRITIS, HANDS, BILATERAL  . SHOULDER PAIN, LEFT, CHRONIC  . OBSTRUCTIVE SLEEP APNEA  . SNORING  . PALPITATIONS, HX OF  . NEPHROLITHIASIS, HX OF  . DEPRESSION   Past Medical History  Diagnosis Date  . Testosterone deficiency   . Snoring   . Fatigue   . Hypopotassemia   . Leg cramps   . Carpal tunnel syndrome   .  Arthritis     osteoarthritis both hands  . Palpitations     Hx of   . Shoulder pain, left   . Hypercholesteremia   . Ulcer     peptic ulcer disease  . Nephrolithiasis     Hx of  . Diabetes mellitus     type II  . Anxiety    Past Surgical History  Procedure Date  . Back surgery     x2 disc   History  Substance Use Topics  . Smoking status: Former Smoker    Quit date: 01/14/1993  . Smokeless tobacco: Not on file  . Alcohol Use: No   Family History  Problem Relation Age of Onset  . Hypertension Mother   . Hypertension Father   . Cancer Sister   . Cancer Brother     kidney  . Heart disease Brother     MI   Allergies  Allergen Reactions  . Aspirin     REACTION: GI upset  . Buspirone Hcl     REACTION: itching  . Crestor (Rosuvastatin Calcium)     Increased his  blood sugar   . Prednisone     REACTION: sick   Current Outpatient Prescriptions on File Prior to Visit  Medication Sig Dispense Refill  . ALPRAZolam (XANAX) 0.5 MG tablet Take 1 tablet (0.5 mg total) by mouth 2 (two) times daily as needed for anxiety.  60 tablet  3  . bisoprolol (ZEBETA) 5 MG tablet Take 5 mg by mouth daily.        . cyclobenzaprine (FLEXERIL) 10 MG tablet Take 10 mg by mouth 3 (three) times daily as needed.        Marland Kitchen glucose blood test strip To check blood sugar two times a day and as needed for DM that is not well controlled 250.0       . Lancets MISC To check blood sugar two times a day and as needed for DM that is not well controlled 250.0 To check blood sugar two times a day and as needed for DM that is not well controlled 250.0  100 each  6  . lisinopril (PRINIVIL,ZESTRIL) 5 MG tablet Take 5 mg by mouth daily.        Marland Kitchen PARoxetine (PAXIL) 40 MG tablet Take 40 mg by mouth every morning.        . potassium chloride (KLOR-CON) 20 MEQ packet Take 20 mEq by mouth daily.  30 tablet  3  . traMADol (ULTRAM) 50 MG tablet Take 50 mg by mouth every 6 (six) hours as needed.       Marland Kitchen  HYDROcodone-homatropine (HYCODAN) 5-1.5 MG/5ML syrup Take 5 mLs by mouth at bedtime as needed.           Review of Systems Review of Systems  Constitutional: Negative for fever, appetite change, fatigue and unexpected weight change.  Eyes: Negative for pain and visual disturbance.  Respiratory: Negative for cough and shortness of breath.   Cardiovascular: Negative for cp or palpitations .   Gastrointestinal: Negative for nausea, diarrhea and constipation.  Genitourinary: Negative for urgency and frequency.  Skin: Negative for pallor.or rash   Neurological: Negative for weakness, light-headedness, numbness and headaches.  Hematological: Negative for adenopathy. Does not bruise/bleed easily.  Psychiatric/Behavioral: Negative for dysphoric mood. The patient is not nervous/anxious.          Objective:   Physical Exam  Constitutional: He appears well-developed and well-nourished. No distress.       overwt and well appearing   HENT:  Head: Normocephalic and atraumatic.  Nose: Nose normal.  Mouth/Throat: Oropharynx is clear and moist.  Eyes: Conjunctivae and EOM are normal. Pupils are equal, round, and reactive to light.  Neck: Normal range of motion. Neck supple. No JVD present. Carotid bruit is not present. No thyromegaly present.  Cardiovascular: Normal rate, regular rhythm, normal heart sounds and intact distal pulses.   Pulmonary/Chest: Effort normal and breath sounds normal. No respiratory distress. He has no wheezes. He exhibits no tenderness.  Abdominal: Soft. Bowel sounds are normal. He exhibits no distension, no abdominal bruit and no mass. There is no tenderness.  Musculoskeletal: Normal range of motion. He exhibits no edema and no tenderness.  Lymphadenopathy:    He has no cervical adenopathy.  Neurological: He is alert. He has normal reflexes. No cranial nerve deficit. Coordination normal.  Skin: Skin is warm and dry. No rash noted. No erythema. No pallor.  Psychiatric: He  has a normal mood and affect.          Assessment & Plan:

## 2010-08-31 NOTE — Assessment & Plan Note (Signed)
Good control No changes  Is on ace  Rev lab with pt  F/u 6 mo after lab

## 2010-08-31 NOTE — Assessment & Plan Note (Signed)
Improved with a1c of 6.3 down from 7.5 Better eating  Also off crestor - and pt was certain that was inc his sugar Rev low glycemic diet Good foot exam sched annual opthy exam - no troubles with vision

## 2010-08-31 NOTE — Assessment & Plan Note (Signed)
This is up as expected off crestor Rev labs with him  Rev low sat fat diet  Will re check 6 mo ? If may consider trial of different statin

## 2010-09-18 ENCOUNTER — Other Ambulatory Visit: Payer: Self-pay | Admitting: *Deleted

## 2010-09-18 MED ORDER — BISOPROLOL FUMARATE 5 MG PO TABS: 5.0000 mg | ORAL_TABLET | Freq: Every day | ORAL | Status: DC

## 2010-11-07 ENCOUNTER — Other Ambulatory Visit (INDEPENDENT_AMBULATORY_CARE_PROVIDER_SITE_OTHER): Payer: Medicare Other

## 2010-11-07 ENCOUNTER — Telehealth: Payer: Self-pay | Admitting: Family Medicine

## 2010-11-07 DIAGNOSIS — E7800 Pure hypercholesterolemia, unspecified: Secondary | ICD-10-CM

## 2010-11-07 DIAGNOSIS — E119 Type 2 diabetes mellitus without complications: Secondary | ICD-10-CM

## 2010-11-07 DIAGNOSIS — Z125 Encounter for screening for malignant neoplasm of prostate: Secondary | ICD-10-CM

## 2010-11-07 DIAGNOSIS — I10 Essential (primary) hypertension: Secondary | ICD-10-CM

## 2010-11-07 LAB — LIPID PANEL
Cholesterol: 225 mg/dL — ABNORMAL HIGH (ref 0–200)
Total CHOL/HDL Ratio: 5
VLDL: 52.4 mg/dL — ABNORMAL HIGH (ref 0.0–40.0)

## 2010-11-07 LAB — COMPREHENSIVE METABOLIC PANEL
AST: 17 U/L (ref 0–37)
Albumin: 4.2 g/dL (ref 3.5–5.2)
Alkaline Phosphatase: 75 U/L (ref 39–117)
BUN: 24 mg/dL — ABNORMAL HIGH (ref 6–23)
Calcium: 9.1 mg/dL (ref 8.4–10.5)
Chloride: 108 mEq/L (ref 96–112)
Glucose, Bld: 119 mg/dL — ABNORMAL HIGH (ref 70–99)
Potassium: 4.3 mEq/L (ref 3.5–5.1)
Sodium: 141 mEq/L (ref 135–145)
Total Protein: 6.9 g/dL (ref 6.0–8.3)

## 2010-11-07 LAB — CBC WITH DIFFERENTIAL/PLATELET
Basophils Relative: 0.6 % (ref 0.0–3.0)
Eosinophils Relative: 3 % (ref 0.0–5.0)
Lymphocytes Relative: 27.9 % (ref 12.0–46.0)
Neutrophils Relative %: 58.7 % (ref 43.0–77.0)
Platelets: 165 10*3/uL (ref 150.0–400.0)
RBC: 4.48 Mil/uL (ref 4.22–5.81)
WBC: 5.3 10*3/uL (ref 4.5–10.5)

## 2010-11-07 LAB — PSA: PSA: 0.69 ng/mL (ref 0.10–4.00)

## 2010-11-07 LAB — LDL CHOLESTEROL, DIRECT: Direct LDL: 152.3 mg/dL

## 2010-11-07 LAB — HEMOGLOBIN A1C: Hgb A1c MFr Bld: 6.1 % (ref 4.6–6.5)

## 2010-11-07 LAB — TSH: TSH: 2.96 u[IU]/mL (ref 0.35–5.50)

## 2010-11-07 NOTE — Telephone Encounter (Signed)
Message copied by Judy Pimple on Wed Nov 07, 2010  8:06 AM ------      Message from: Baldomero Lamy      Created: Tue Nov 06, 2010  8:21 AM      Regarding: Cpx labs Wed 10/24       Please order  future cpx labs for pt's upcomming lab appt.      Thanks      Rodney Booze

## 2010-11-12 ENCOUNTER — Encounter: Payer: Self-pay | Admitting: Family Medicine

## 2010-11-12 ENCOUNTER — Ambulatory Visit (INDEPENDENT_AMBULATORY_CARE_PROVIDER_SITE_OTHER): Payer: Medicare Other | Admitting: Family Medicine

## 2010-11-12 VITALS — BP 126/74 | HR 60 | Temp 98.0°F | Ht 68.0 in | Wt 238.0 lb

## 2010-11-12 DIAGNOSIS — I10 Essential (primary) hypertension: Secondary | ICD-10-CM

## 2010-11-12 DIAGNOSIS — E7800 Pure hypercholesterolemia, unspecified: Secondary | ICD-10-CM

## 2010-11-12 DIAGNOSIS — Z23 Encounter for immunization: Secondary | ICD-10-CM

## 2010-11-12 DIAGNOSIS — E119 Type 2 diabetes mellitus without complications: Secondary | ICD-10-CM

## 2010-11-12 DIAGNOSIS — Z125 Encounter for screening for malignant neoplasm of prostate: Secondary | ICD-10-CM

## 2010-11-12 DIAGNOSIS — S91309A Unspecified open wound, unspecified foot, initial encounter: Secondary | ICD-10-CM

## 2010-11-12 MED ORDER — POTASSIUM CHLORIDE 20 MEQ PO PACK: 20.0000 meq | PACK | Freq: Every day | ORAL | Status: DC

## 2010-11-12 MED ORDER — ALPRAZOLAM 0.5 MG PO TABS: 0.5000 mg | ORAL_TABLET | Freq: Two times a day (BID) | ORAL | Status: DC | PRN

## 2010-11-12 MED ORDER — CYCLOBENZAPRINE HCL 10 MG PO TABS: 10.0000 mg | ORAL_TABLET | Freq: Three times a day (TID) | ORAL | Status: DC | PRN

## 2010-11-12 NOTE — Assessment & Plan Note (Signed)
2 healed puncture wounds R foot -that look to be healing and no infx (30 week old) Adv to keep clean and dry Will update if any s/s of infx-see inst Td is up to date

## 2010-11-12 NOTE — Assessment & Plan Note (Signed)
Nl psa- stable No symptoms or problems

## 2010-11-12 NOTE — Progress Notes (Signed)
Subjective:    Patient ID: Scott Gomez, male    DOB: 07/30/1934, 76 y.o.   MRN: [id number]  HPI Here for f/u of DM and HTN and hyperlipidemia and foot injury Is feeling good  No new med problems   He did have an injury to foot last Tuesday - sore but not looking infected -- a nail (clean) punctured top of R great toe No pus or redness- just a little sore  No neuropathy known  Feet do stay cold  Last Td was 8/11  Wt is up 6 lb with bmi of 36 It goes up and down  Cannot do a lot of exercise- legs are too tired   HTN is well controlled with 126/74 today No side eff from meds No cp or ha or edema  Chol high off crestor (which raised his sugar) Lab Results  Component Value Date   CHOL 225* 11/07/2010   CHOL 216* 08/23/2010   CHOL 121 05/11/2010   Lab Results  Component Value Date   HDL 43.80 11/07/2010   HDL 45.80 08/23/2010   HDL 43.80 05/11/2010   Lab Results  Component Value Date   LDLCALC 45 05/11/2010   LDLCALC 37 08/21/2009   LDLCALC 38 01/16/2009   Lab Results  Component Value Date   TRIG 262.0* 11/07/2010   TRIG 184.0* 08/23/2010   TRIG 159.0* 05/11/2010   Lab Results  Component Value Date   CHOLHDL 5 11/07/2010   CHOLHDL 5 08/23/2010   CHOLHDL 3 05/11/2010   Lab Results  Component Value Date   LDLDIRECT 152.3 11/07/2010   LDLDIRECT 148.3 08/23/2010   diet - stays away from red meat ,  But eats too many whole eggs, also sausages every am  Did not know these things had chol in them  Overall not good understanding of sat fats/ where they are   DM is further improved  a1c is 6.1 with diet control  opthy nl in 7.11  Is eating a healthy diet - keeping away from sugar and fats  No excess thirst or urination  psa .69- stable No prostate issues No nocturia or stream problems   Flu vaccine - will get that today   Patient Active Problem List  Diagnoses  . DIABETES MELLITUS, TYPE II  . TESTOSTERONE DEFICIENCY  . HYPERCHOLESTEROLEMIA  . ANXIETY  . CARPAL  TUNNEL SYNDROME  . ESSENTIAL HYPERTENSION, BENIGN  . C O P D  . PEPTIC ULCER DISEASE  . OSTEOARTHRITIS, HANDS, BILATERAL  . SHOULDER PAIN, LEFT, CHRONIC  . OBSTRUCTIVE SLEEP APNEA  . SNORING  . PALPITATIONS, HX OF  . NEPHROLITHIASIS, HX OF  . DEPRESSION  . Prostate cancer screening   Past Medical History  Diagnosis Date  . Testosterone deficiency   . Snoring   . Fatigue   . Hypopotassemia   . Leg cramps   . Carpal tunnel syndrome   . Arthritis     osteoarthritis both hands  . Palpitations     Hx of   . Shoulder pain, left   . Hypercholesteremia   . Ulcer     peptic ulcer disease  . Nephrolithiasis     Hx of  . Diabetes mellitus     type II  . Anxiety    Past Surgical History  Procedure Date  . Back surgery     x2 disc   History  Substance Use Topics  . Smoking status: Former Smoker    Quit date: 01/14/1993  . Smokeless  tobacco: Never Used  . Alcohol Use: No   Family History  Problem Relation Age of Onset  . Hypertension Mother   . Hypertension Father   . Cancer Sister   . Cancer Brother     kidney  . Heart disease Brother     MI   Allergies  Allergen Reactions  . Aspirin     REACTION: GI upset  . Buspirone Hcl     REACTION: itching  . Crestor (Rosuvastatin Calcium)     Increased his blood sugar   . Prednisone     REACTION: sick   Current Outpatient Prescriptions on File Prior to Visit  Medication Sig Dispense Refill  . bisoprolol (ZEBETA) 5 MG tablet Take 1 tablet (5 mg total) by mouth daily.  30 tablet  6  . glucose blood test strip To check blood sugar two times a day and as needed for DM that is not well controlled 250.0       . Lancets MISC To check blood sugar two times a day and as needed for DM that is not well controlled 250.0 To check blood sugar two times a day and as needed for DM that is not well controlled 250.0  100 each  6  . lisinopril (PRINIVIL,ZESTRIL) 5 MG tablet Take 5 mg by mouth daily.        Marland Kitchen PARoxetine (PAXIL) 40 MG  tablet Take 40 mg by mouth every morning.        . traMADol (ULTRAM) 50 MG tablet Take 50 mg by mouth every 6 (six) hours as needed.       . travoprost, benzalkonium, (TRAVATAN) 0.004 % ophthalmic solution Place 1 drop into both eyes at bedtime.            Review of Systems Review of Systems  Constitutional: Negative for fever, appetite change, fatigue and unexpected weight change.  Eyes: Negative for pain and visual disturbance.  Respiratory: Negative for cough and shortness of breath.   Cardiovascular: Negative for cp or palpitations    Gastrointestinal: Negative for nausea, diarrhea and constipation.  Genitourinary: Negative for urgency and frequency.  Skin: Negative for pallor or rash  Pos for wound to foot that is healing  MSK pos for chronic leg and back pain  Neurological: Negative for weakness, light-headedness, numbness and headaches.  Hematological: Negative for adenopathy. Does not bruise/bleed easily.  Psychiatric/Behavioral: Negative for dysphoric mood. The patient is not nervous/anxious.          Objective:   Physical Exam  Constitutional: He is oriented to person, place, and time. He appears well-developed and well-nourished. No distress.       overwt and well appearing   HENT:  Head: Normocephalic and atraumatic.  Right Ear: External ear normal.  Left Ear: External ear normal.  Nose: Nose normal.  Mouth/Throat: Oropharynx is clear and moist.       Scant cerumen  Eyes: Conjunctivae and EOM are normal. Pupils are equal, round, and reactive to light. No scleral icterus.  Neck: Normal range of motion. Neck supple. No JVD present. Carotid bruit is not present. No thyromegaly present.  Cardiovascular: Normal rate, regular rhythm, normal heart sounds and intact distal pulses.  Exam reveals no gallop.   Pulmonary/Chest: Effort normal and breath sounds normal. No respiratory distress. He has no wheezes.  Abdominal: Soft. Bowel sounds are normal. He exhibits no distension,  no abdominal bruit and no mass. There is no tenderness.  Genitourinary: Rectum normal and prostate normal. Guaiac  negative stool.  Musculoskeletal: Normal range of motion. He exhibits no edema and no tenderness.  Lymphadenopathy:    He has no cervical adenopathy.  Neurological: He is alert and oriented to person, place, and time. He has normal reflexes. No cranial nerve deficit. He exhibits normal muscle tone. Coordination normal.  Skin: Skin is warm and dry. No rash noted. No erythema. No pallor.       Small healing puncture wound to mid foot and also anterior R great toe  No redness/ swelling or signs of infection Not tender  Psychiatric: He has a normal mood and affect.          Assessment & Plan:

## 2010-11-12 NOTE — Assessment & Plan Note (Signed)
Improved further with low sugar diet- commended on this Disc adding exercise- perhaps a chair video (sitting exercise) would be helpful  Disc imp of wt control Is utd opthy - no problems  Diet control F/u 6 mo

## 2010-11-12 NOTE — Patient Instructions (Addendum)
Flu shot today  Avoid red meat/ fried foods/ egg yolks/ fatty breakfast meats/ butter, cheese and high fat dairy/ and shellfish   This will help your cholesterol  Watch spot on your foot- if redness/pain /swelling or pus - call asap (or fever)  Try to look into "chair exercise"- where you do a workout sitting -- you can google it on computer to find some programs - this would be helpful for energy and weight loss Sugar looks good Cholesterol is high - so really work on diet  Schedule f/u in 6 mo with labs prior

## 2010-11-12 NOTE — Assessment & Plan Note (Signed)
Up as expected again off statin (which raised his sugar) Rev low sat fat diet - pt had very poor understanding of this  Given handouts as well Needs to change to egg whites/ and stop pork breakfast meats (which he eats daily) Also exercise (sitting if necessary) Re check in 6 mo and f/u  ? If another statin would also cause inc sugar

## 2010-11-12 NOTE — Assessment & Plan Note (Signed)
Good control- ace without side eff or symptoms No change in meds F/u 6 mo Rev lab today

## 2010-11-13 ENCOUNTER — Telehealth: Payer: Self-pay | Admitting: *Deleted

## 2010-11-13 NOTE — Telephone Encounter (Signed)
Aneta Mins at [facility] pharmacy notified as instructed by telephone.

## 2010-11-13 NOTE — Telephone Encounter (Signed)
No - please change to tablets -- my error, thanks

## 2010-11-13 NOTE — Telephone Encounter (Signed)
[facility] pharmacy is asking if you meant to change pt from klor con tablets to packets, packets were sent in yesterday.  Please advise.

## 2010-11-19 ENCOUNTER — Telehealth: Payer: Self-pay | Admitting: *Deleted

## 2010-11-19 MED ORDER — CYCLOBENZAPRINE HCL 10 MG PO TABS: 10.0000 mg | ORAL_TABLET | Freq: Three times a day (TID) | ORAL | Status: DC | PRN

## 2010-11-19 NOTE — Telephone Encounter (Signed)
[facility] pharmacy notified as instructed by telephone. Med list updated as instructed.

## 2010-11-19 NOTE — Telephone Encounter (Signed)
That is just fine - please note in the chart  thanks

## 2010-11-19 NOTE — Telephone Encounter (Signed)
Pharmacist states that pt's last refill on flexeril was for # 30 and he always gets #90. He is asking if he can change the script from 30 with 5 refills to 90 with one refill.  Please advise.

## 2010-12-31 ENCOUNTER — Encounter (HOSPITAL_COMMUNITY): Payer: Self-pay | Admitting: Emergency Medicine

## 2010-12-31 ENCOUNTER — Emergency Department (HOSPITAL_COMMUNITY): Payer: Medicare Other

## 2010-12-31 ENCOUNTER — Emergency Department (HOSPITAL_COMMUNITY)
Admission: EM | Admit: 2010-12-31 | Discharge: 2011-01-01 | Disposition: A | Discharge status: Home / Self Care | Payer: Medicare Other | Source: Home / Self Care | Attending: Emergency Medicine | Admitting: Emergency Medicine

## 2010-12-31 DIAGNOSIS — E119 Type 2 diabetes mellitus without complications: Secondary | ICD-10-CM | POA: Insufficient documentation

## 2010-12-31 DIAGNOSIS — F411 Generalized anxiety disorder: Secondary | ICD-10-CM | POA: Insufficient documentation

## 2010-12-31 DIAGNOSIS — M2550 Pain in unspecified joint: Secondary | ICD-10-CM | POA: Insufficient documentation

## 2010-12-31 DIAGNOSIS — M129 Arthropathy, unspecified: Secondary | ICD-10-CM | POA: Insufficient documentation

## 2010-12-31 DIAGNOSIS — Z9889 Other specified postprocedural states: Secondary | ICD-10-CM | POA: Insufficient documentation

## 2010-12-31 DIAGNOSIS — M542 Cervicalgia: Secondary | ICD-10-CM | POA: Insufficient documentation

## 2010-12-31 DIAGNOSIS — R51 Headache: Secondary | ICD-10-CM | POA: Insufficient documentation

## 2010-12-31 DIAGNOSIS — E7800 Pure hypercholesterolemia, unspecified: Secondary | ICD-10-CM | POA: Insufficient documentation

## 2010-12-31 DIAGNOSIS — Z79899 Other long term (current) drug therapy: Secondary | ICD-10-CM | POA: Insufficient documentation

## 2010-12-31 LAB — URINALYSIS, ROUTINE W REFLEX MICROSCOPIC
Glucose, UA: NEGATIVE mg/dL
Hgb urine dipstick: NEGATIVE
Specific Gravity, Urine: 1.013 (ref 1.005–1.030)
pH: 7 (ref 5.0–8.0)

## 2010-12-31 LAB — PROTIME-INR
INR: 1.15 (ref 0.00–1.49)
Prothrombin Time: 14.9 seconds (ref 11.6–15.2)

## 2010-12-31 LAB — POCT I-STAT, CHEM 8
Chloride: 102 mEq/L (ref 96–112)
HCT: 37 % — ABNORMAL LOW (ref 39.0–52.0)
Potassium: 3.6 mEq/L (ref 3.5–5.1)

## 2010-12-31 LAB — DIFFERENTIAL
Lymphocytes Relative: 17 % (ref 12–46)
Lymphs Abs: 1.6 10*3/uL (ref 0.7–4.0)
Monocytes Relative: 10 % (ref 3–12)
Neutro Abs: 6.7 10*3/uL (ref 1.7–7.7)
Neutrophils Relative %: 70 % (ref 43–77)

## 2010-12-31 LAB — CBC
Hemoglobin: 12.4 g/dL — ABNORMAL LOW (ref 13.0–17.0)
MCH: 28.7 pg (ref 26.0–34.0)
RBC: 4.32 MIL/uL (ref 4.22–5.81)

## 2010-12-31 MED ORDER — KETOROLAC TROMETHAMINE 30 MG/ML IJ SOLN
INTRAMUSCULAR | Status: AC
  Filled 2010-12-31: qty 1

## 2010-12-31 MED ORDER — METOCLOPRAMIDE HCL 5 MG/ML IJ SOLN
10.0000 mg | Freq: Once | INTRAMUSCULAR | Status: AC
  Administered 2010-12-31: 10 mg via INTRAVENOUS
  Filled 2010-12-31: qty 2

## 2010-12-31 MED ORDER — SODIUM CHLORIDE 0.9 % IV SOLN
Freq: Once | INTRAVENOUS | Status: AC
  Administered 2010-12-31: 22:00:00 via INTRAVENOUS

## 2010-12-31 MED ORDER — DIPHENHYDRAMINE HCL 50 MG/ML IJ SOLN
12.5000 mg | Freq: Once | INTRAMUSCULAR | Status: AC
  Administered 2010-12-31: 12.5 mg via INTRAVENOUS
  Filled 2010-12-31: qty 1

## 2010-12-31 MED ORDER — DIPHENHYDRAMINE HCL 12.5 MG/5ML PO ELIX: 12.5000 mg | ORAL_SOLUTION | Freq: Once | ORAL | Status: DC

## 2010-12-31 MED ORDER — ONDANSETRON HCL 4 MG/2ML IJ SOLN
4.0000 mg | Freq: Once | INTRAMUSCULAR | Status: AC
  Administered 2010-12-31: 4 mg via INTRAVENOUS
  Filled 2010-12-31: qty 2

## 2010-12-31 MED ORDER — MORPHINE SULFATE 4 MG/ML IJ SOLN
4.0000 mg | Freq: Once | INTRAMUSCULAR | Status: AC
  Administered 2010-12-31: 4 mg via INTRAVENOUS
  Filled 2010-12-31: qty 1

## 2010-12-31 NOTE — ED Provider Notes (Signed)
History     CSN: [id number] Arrival date & time: 12/31/2010  3:59 PM   First MD Initiated Contact with Patient 12/31/10 2110      Chief Complaint  Patient presents with  . Headache    (Consider location/radiation/quality/duration/timing/severity/associated sxs/prior treatment) HPI Comments: Scott Gomez reports progressive posterior headache that starts at the base of his skull and radiates to the top of his head, denies nausea, vomiting, photophobia, phonophobia.  Denies trauma, fall, history of headaches, sinusitis, upper respiratory tract, ear pain, sore throat.  States he was seen by his orthopedist, who x-rayed his neck and shoulders and sent him to the emergency room for further evaluation of his head.  He did have an appointment with [name].  This morning, but came to the emergency room instead.  He states he is taking his normal, Ultram for pain which "does not touch the headache"  Patient is a 76 y.o. male presenting with headaches. The history is provided by the patient.  Headache  This is a new problem. The current episode started more than 1 week ago. The problem occurs constantly. The problem has been gradually worsening. The pain is located in the parietal region. The quality of the pain is described as throbbing. The pain is at a severity of 10/10. The pain is severe. The pain does not radiate. Pertinent negatives include no anorexia, no fever, no near-syncope, no palpitations, no syncope, no nausea and no vomiting.    Past Medical History  Diagnosis Date  . Testosterone deficiency   . Snoring   . Fatigue   . Hypopotassemia   . Leg cramps   . Carpal tunnel syndrome   . Arthritis     osteoarthritis both hands  . Palpitations     Hx of   . Shoulder pain, left   . Hypercholesteremia   . Ulcer     peptic ulcer disease  . Nephrolithiasis     Hx of  . Diabetes mellitus     type II  . Anxiety     Past Surgical History  Procedure Date  . Back surgery     x2  disc    Family History  Problem Relation Age of Onset  . Hypertension Mother   . Hypertension Father   . Cancer Sister   . Cancer Brother     kidney  . Heart disease Brother     MI    History  Substance Use Topics  . Smoking status: Former Smoker    Quit date: 01/14/1993  . Smokeless tobacco: Never Used  . Alcohol Use: No      Review of Systems  Constitutional: Negative for fever.  HENT: Positive for neck pain. Negative for ear pain, congestion, sore throat, rhinorrhea, neck stiffness and ear discharge.   Eyes: Negative for photophobia, pain, redness and visual disturbance.  Respiratory: Negative for cough.   Cardiovascular: Negative for chest pain, palpitations, syncope and near-syncope.  Gastrointestinal: Negative for nausea, vomiting and anorexia.  Genitourinary: Negative for dysuria and frequency.  Musculoskeletal: Positive for arthralgias.  Skin: Negative for rash and wound.  Neurological: Positive for headaches. Negative for dizziness, speech difficulty, weakness and light-headedness.  Hematological: Negative for adenopathy.  Psychiatric/Behavioral: Negative.     Allergies  Aspirin; Buspirone hcl; Crestor; and Prednisone  Home Medications   Current Outpatient Rx  Name Route Sig Dispense Refill  . ALPRAZOLAM 0.5 MG PO TABS Oral Take 1 tablet (0.5 mg total) by mouth 2 (two) times daily as needed  for anxiety. 60 tablet 3  . BISOPROLOL FUMARATE 5 MG PO TABS Oral Take 1 tablet (5 mg total) by mouth daily. 30 tablet 6  . CYCLOBENZAPRINE HCL 10 MG PO TABS Oral Take 10 mg by mouth 3 (three) times daily as needed. For muscle spasms     . LISINOPRIL 5 MG PO TABS Oral Take 5 mg by mouth daily.      Marland Kitchen GREEN TEA SLIM PO TABS Oral Take 1 tablet by mouth at bedtime.      Marland Kitchen PAROXETINE HCL 40 MG PO TABS Oral Take 40 mg by mouth every morning.      Marland Kitchen POTASSIUM CHLORIDE 20 MEQ PO PACK Oral Take 40 mEq by mouth daily.      . TRAMADOL HCL 50 MG PO TABS Oral Take 50 mg by mouth  every 6 (six) hours as needed. For pain    . TRAVOPROST 0.004 % OP SOLN Both Eyes Place 1 drop into both eyes at bedtime.        BP 157/77  Pulse 63  Temp(Src) 97.7 F (36.5 C) (Oral)  Resp 17  SpO2 95%  Physical Exam  ED Course  Procedures (including critical care time)  Labs Reviewed  CBC - Abnormal; Notable for the following:    Hemoglobin 12.4 (*)    HCT 37.0 (*)    All other components within normal limits  URINALYSIS, ROUTINE W REFLEX MICROSCOPIC - Abnormal; Notable for the following:    Ketones, ur 15 (*)    Urobilinogen, UA 2.0 (*)    All other components within normal limits  POCT I-STAT, CHEM 8 - Abnormal; Notable for the following:    Glucose, Bld 124 (*)    Hemoglobin 12.6 (*)    HCT 37.0 (*)    All other components within normal limits  DIFFERENTIAL  PROTIME-INR  I-STAT, CHEM 8   Ct Head Wo Contrast  12/31/2010  *RADIOLOGY REPORT*  Clinical Data: Headache  CT HEAD WITHOUT CONTRAST  Technique:  Contiguous axial images were obtained from the base of the skull through the vertex without contrast.  Comparison: None.  Findings: There is no evidence for acute hemorrhage, hydrocephalus, mass lesion, or abnormal extra-axial fluid collection.  No definite CT evidence for acute infarction.  The visualized paranasal sinuses and mastoid air cells are predominately clear.  IMPRESSION: No acute intracranial abnormality.  Original Report Authenticated By: Waneta Martins, M.D.     1. Headache in back of head     Patient's head CT is normal.  Labs are normal.  Urine is normal.  Patient has been medicated for pain and nausea and has some improvement in his headache.  His gait is steady, will discharge him allowing him to followup with his primary care Dr.[name] at  family practice  MDM  Due to the nature of this headache, and the progression without previous history of headaches and considering subdural.  Will obtain CT scan will evaluate CBC i-STAT urine, IV hydrate and  provide pain control        Arman Filter, NP 12/31/10 2126  Arman Filter, NP 01/01/11 0005  Arman Filter, NP 01/01/11 0007

## 2010-12-31 NOTE — ED Notes (Signed)
Pt c/o HA on back of head x 3 days; pt mae and no weakness noted; pt denies N/V or blurry vision

## 2010-12-31 NOTE — ED Notes (Signed)
Family member would like a call reagrding admission or discharge  [phone]

## 2011-01-01 ENCOUNTER — Encounter: Payer: Self-pay | Admitting: Family Medicine

## 2011-01-01 ENCOUNTER — Encounter (HOSPITAL_COMMUNITY): Payer: Self-pay

## 2011-01-01 ENCOUNTER — Ambulatory Visit (INDEPENDENT_AMBULATORY_CARE_PROVIDER_SITE_OTHER): Payer: Medicare Other | Admitting: Family Medicine

## 2011-01-01 ENCOUNTER — Inpatient Hospital Stay: Admit: 2011-01-01 | Payer: Self-pay | Source: Ambulatory Visit | Admitting: Internal Medicine

## 2011-01-01 ENCOUNTER — Emergency Department (HOSPITAL_COMMUNITY): Payer: Medicare Other

## 2011-01-01 ENCOUNTER — Inpatient Hospital Stay (HOSPITAL_COMMUNITY)
Admission: EM | Admit: 2011-01-01 | Discharge: 2011-01-03 | DRG: 552 | Disposition: A | Discharge status: Home / Self Care | Payer: Medicare Other | Source: Ambulatory Visit | Attending: Internal Medicine | Admitting: Internal Medicine

## 2011-01-01 VITALS — BP 120/82 | HR 64 | Temp 98.5°F | Ht 70.0 in | Wt 232.4 lb

## 2011-01-01 DIAGNOSIS — R51 Headache: Secondary | ICD-10-CM

## 2011-01-01 DIAGNOSIS — T424X5A Adverse effect of benzodiazepines, initial encounter: Secondary | ICD-10-CM | POA: Diagnosis not present

## 2011-01-01 DIAGNOSIS — R0609 Other forms of dyspnea: Secondary | ICD-10-CM

## 2011-01-01 DIAGNOSIS — R0989 Other specified symptoms and signs involving the circulatory and respiratory systems: Secondary | ICD-10-CM

## 2011-01-01 DIAGNOSIS — T40605A Adverse effect of unspecified narcotics, initial encounter: Secondary | ICD-10-CM | POA: Diagnosis not present

## 2011-01-01 DIAGNOSIS — F411 Generalized anxiety disorder: Secondary | ICD-10-CM

## 2011-01-01 DIAGNOSIS — R29818 Other symptoms and signs involving the nervous system: Secondary | ICD-10-CM

## 2011-01-01 DIAGNOSIS — R2990 Unspecified symptoms and signs involving the nervous system: Secondary | ICD-10-CM

## 2011-01-01 DIAGNOSIS — E7800 Pure hypercholesterolemia, unspecified: Secondary | ICD-10-CM

## 2011-01-01 DIAGNOSIS — E119 Type 2 diabetes mellitus without complications: Secondary | ICD-10-CM

## 2011-01-01 DIAGNOSIS — F329 Major depressive disorder, single episode, unspecified: Secondary | ICD-10-CM

## 2011-01-01 DIAGNOSIS — R519 Headache, unspecified: Secondary | ICD-10-CM | POA: Diagnosis present

## 2011-01-01 DIAGNOSIS — E876 Hypokalemia: Secondary | ICD-10-CM | POA: Diagnosis present

## 2011-01-01 DIAGNOSIS — J4489 Other specified chronic obstructive pulmonary disease: Secondary | ICD-10-CM

## 2011-01-01 DIAGNOSIS — M542 Cervicalgia: Principal | ICD-10-CM | POA: Diagnosis present

## 2011-01-01 DIAGNOSIS — I10 Essential (primary) hypertension: Secondary | ICD-10-CM

## 2011-01-01 DIAGNOSIS — M216X9 Other acquired deformities of unspecified foot: Secondary | ICD-10-CM | POA: Diagnosis present

## 2011-01-01 DIAGNOSIS — G4730 Sleep apnea, unspecified: Secondary | ICD-10-CM

## 2011-01-01 DIAGNOSIS — R270 Ataxia, unspecified: Secondary | ICD-10-CM

## 2011-01-01 DIAGNOSIS — F3289 Other specified depressive episodes: Secondary | ICD-10-CM

## 2011-01-01 DIAGNOSIS — Z8679 Personal history of other diseases of the circulatory system: Secondary | ICD-10-CM

## 2011-01-01 DIAGNOSIS — M19049 Primary osteoarthritis, unspecified hand: Secondary | ICD-10-CM

## 2011-01-01 DIAGNOSIS — M25519 Pain in unspecified shoulder: Secondary | ICD-10-CM

## 2011-01-01 DIAGNOSIS — Z125 Encounter for screening for malignant neoplasm of prostate: Secondary | ICD-10-CM

## 2011-01-01 DIAGNOSIS — R4182 Altered mental status, unspecified: Secondary | ICD-10-CM

## 2011-01-01 DIAGNOSIS — G4733 Obstructive sleep apnea (adult) (pediatric): Secondary | ICD-10-CM | POA: Diagnosis present

## 2011-01-01 DIAGNOSIS — D649 Anemia, unspecified: Secondary | ICD-10-CM | POA: Diagnosis present

## 2011-01-01 DIAGNOSIS — G8929 Other chronic pain: Secondary | ICD-10-CM | POA: Diagnosis present

## 2011-01-01 DIAGNOSIS — Z01812 Encounter for preprocedural laboratory examination: Secondary | ICD-10-CM

## 2011-01-01 DIAGNOSIS — K279 Peptic ulcer, site unspecified, unspecified as acute or chronic, without hemorrhage or perforation: Secondary | ICD-10-CM

## 2011-01-01 DIAGNOSIS — G5600 Carpal tunnel syndrome, unspecified upper limb: Secondary | ICD-10-CM

## 2011-01-01 DIAGNOSIS — E291 Testicular hypofunction: Secondary | ICD-10-CM

## 2011-01-01 DIAGNOSIS — R279 Unspecified lack of coordination: Secondary | ICD-10-CM | POA: Diagnosis present

## 2011-01-01 DIAGNOSIS — J449 Chronic obstructive pulmonary disease, unspecified: Secondary | ICD-10-CM

## 2011-01-01 DIAGNOSIS — S91309A Unspecified open wound, unspecified foot, initial encounter: Secondary | ICD-10-CM

## 2011-01-01 DIAGNOSIS — Z87442 Personal history of urinary calculi: Secondary | ICD-10-CM

## 2011-01-01 LAB — GLUCOSE, CAPILLARY: Glucose-Capillary: 136 mg/dL — ABNORMAL HIGH (ref 70–99)

## 2011-01-01 MED ORDER — ASPIRIN EC 325 MG PO TBEC
325.0000 mg | DELAYED_RELEASE_TABLET | Freq: Every day | ORAL | Status: DC
  Administered 2011-01-01 – 2011-01-03 (×3): 325 mg via ORAL
  Filled 2011-01-01 (×3): qty 1

## 2011-01-01 MED ORDER — ACETAMINOPHEN 650 MG RE SUPP: 650.0000 mg | Freq: Four times a day (QID) | RECTAL | Status: DC | PRN

## 2011-01-01 MED ORDER — OXYCODONE HCL 5 MG PO TABS: 5.0000 mg | ORAL_TABLET | ORAL | Status: DC | PRN

## 2011-01-01 MED ORDER — PAROXETINE HCL 20 MG PO TABS
40.0000 mg | ORAL_TABLET | ORAL | Status: DC
  Administered 2011-01-02 – 2011-01-03 (×2): 40 mg via ORAL
  Filled 2011-01-01 (×3): qty 2

## 2011-01-01 MED ORDER — MORPHINE SULFATE 2 MG/ML IJ SOLN: 1.0000 mg | INTRAMUSCULAR | Status: DC | PRN

## 2011-01-01 MED ORDER — TRAVOPROST 0.004 % OP SOLN
1.0000 [drp] | Freq: Every day | OPHTHALMIC | Status: DC
  Administered 2011-01-01 – 2011-01-02 (×2): 1 [drp] via OPHTHALMIC
  Filled 2011-01-01 (×4): qty 0.1

## 2011-01-01 MED ORDER — LISINOPRIL 5 MG PO TABS
5.0000 mg | ORAL_TABLET | Freq: Every day | ORAL | Status: DC
  Administered 2011-01-01 – 2011-01-03 (×3): 5 mg via ORAL
  Filled 2011-01-01 (×3): qty 1

## 2011-01-01 MED ORDER — BISOPROLOL FUMARATE 5 MG PO TABS
5.0000 mg | ORAL_TABLET | Freq: Every day | ORAL | Status: DC
  Administered 2011-01-01 – 2011-01-03 (×3): 5 mg via ORAL
  Filled 2011-01-01 (×3): qty 1

## 2011-01-01 MED ORDER — ALPRAZOLAM 0.5 MG PO TABS
0.5000 mg | ORAL_TABLET | Freq: Two times a day (BID) | ORAL | Status: DC | PRN
  Administered 2011-01-01: 0.5 mg via ORAL
  Filled 2011-01-01: qty 1

## 2011-01-01 MED ORDER — INSULIN ASPART 100 UNIT/ML ~~LOC~~ SOLN
0.0000 [IU] | Freq: Three times a day (TID) | SUBCUTANEOUS | Status: DC
  Administered 2011-01-02: 1 [IU] via SUBCUTANEOUS
  Administered 2011-01-02: 2 [IU] via SUBCUTANEOUS
  Administered 2011-01-03: 1 [IU] via SUBCUTANEOUS
  Filled 2011-01-01: qty 3

## 2011-01-01 MED ORDER — ENOXAPARIN SODIUM 40 MG/0.4ML ~~LOC~~ SOLN
40.0000 mg | Freq: Every day | SUBCUTANEOUS | Status: DC
  Administered 2011-01-01 – 2011-01-02 (×2): 40 mg via SUBCUTANEOUS
  Filled 2011-01-01 (×3): qty 0.4

## 2011-01-01 MED ORDER — CYCLOBENZAPRINE HCL 10 MG PO TABS: 10.0000 mg | ORAL_TABLET | Freq: Three times a day (TID) | ORAL | Status: DC | PRN

## 2011-01-01 MED ORDER — ACETAMINOPHEN 325 MG PO TABS
650.0000 mg | ORAL_TABLET | Freq: Four times a day (QID) | ORAL | Status: DC | PRN
  Administered 2011-01-01: 650 mg via ORAL
  Filled 2011-01-01: qty 74

## 2011-01-01 MED ORDER — POTASSIUM CHLORIDE CRYS ER 20 MEQ PO TBCR
20.0000 meq | EXTENDED_RELEASE_TABLET | Freq: Every day | ORAL | Status: DC
  Administered 2011-01-02 – 2011-01-03 (×2): 20 meq via ORAL
  Filled 2011-01-01 (×2): qty 1

## 2011-01-01 NOTE — ED Notes (Signed)
Report given to [facility], RN on 5500. Patient being transferred to 5506-02.

## 2011-01-01 NOTE — ED Notes (Signed)
Notified Lanora Manis, [place] on 5500 that patient is going to MRI and then will be transfer to [phone].

## 2011-01-01 NOTE — ED Notes (Signed)
First time meeting patient. Patient states he has been having headaches x 2-3 week but only bas enough to know it was hurting. Patient states x 3 days ago the headache started to get worse in the back of his head. Patient denies chest pain, n/v and fever. Patient states he was seen in the ED Sunday night for headache and was given pain medication and sent home to follow up with his doctor. Patient states that he has never had a headache like the ones he has had x 3 days. Patient states his doctor sent him back to the ED today for evaluation of why he is having headaches. Patient is sitting in wheelchair in the room and states he is more comfortable in wheelchair than laying in bed.

## 2011-01-01 NOTE — ED Notes (Signed)
[id number] ready

## 2011-01-01 NOTE — Progress Notes (Signed)
Patient Name: Scott Gomez Date of Birth: Jun 08, 1934 Age: 76 y.o. Medical Record Number: [id number] Gender: male  History of Present Illness:  Scott Gomez is a 76 y.o. very pleasant male patient with DM, hyperlipidemia who presents with the following:  Acute onset mental status changes starting on Saturday, ataxia starting on Saturday, and 10/10 headache starting on Saturday. The patient has also started "mumbling" and his wife finds his speech difficult to understand starting today compared to prior. Starting on Saturday, he has been using a cane but did not 1 week ago and has had no injury.  4 days of confusion -- seeing things that are not there. Seeing rats and bugs. Seeing two men in the yard. Drinking his coffee and felt like something was in his coffee but was not.   Nothing like this has ever happened in the past. He is also staggering and unstable since Saturday --- "totally different" according to wife. Will all of the sudden drop things on the ground.  No ETOH, no drugs. No extra pills. Getting worse.  Talked all night long out of his head.   Trouble lifting feet, but not new. (prior back problems and surgery) Some mumbling -- trouble making out what he is saying.  Patient was evaluated in the ER yesterday and had a normal CT of the head without contrast.  CBC:    Component Value Date/Time   WBC 9.5 12/31/2010 2136   HGB 12.6* 12/31/2010 2208   HCT 37.0* 12/31/2010 2208   PLT 233 12/31/2010 2136   MCV 85.6 12/31/2010 2136   NEUTROABS 6.7 12/31/2010 2136   LYMPHSABS 1.6 12/31/2010 2136   MONOABS 1.0 12/31/2010 2136   EOSABS 0.1 12/31/2010 2136   BASOSABS 0.1 12/31/2010 2136   Comprehensive Metabolic Panel:    Component Value Date/Time   NA 140 12/31/2010 2208   K 3.6 12/31/2010 2208   CL 102 12/31/2010 2208   CO2 25 11/07/2010 0835   BUN 12 12/31/2010 2208   CREATININE 0.80 12/31/2010 2208   GLUCOSE 124* 12/31/2010 2208   CALCIUM 9.1 11/07/2010 0835    AST 17 11/07/2010 0835   ALT 17 11/07/2010 0835   ALKPHOS 75 11/07/2010 0835   BILITOT 0.5 11/07/2010 0835   PROT 6.9 11/07/2010 0835   ALBUMIN 4.2 11/07/2010 0835    CT HEAD WITHOUT CONTRAST   Technique:  Contiguous axial images were obtained from the base of the skull through the vertex without contrast.   Comparison: None.   Findings: There is no evidence for acute hemorrhage, hydrocephalus, mass lesion, or abnormal extra-axial fluid collection.  No definite CT evidence for acute infarction.  The visualized paranasal sinuses and mastoid air cells are predominately clear.   IMPRESSION: No acute intracranial abnormality.   Original Report Authenticated By: Waneta Martins, M.D.  Patient Active Problem List  Diagnoses  . DIABETES MELLITUS, TYPE II  . TESTOSTERONE DEFICIENCY  . HYPERCHOLESTEROLEMIA  . ANXIETY  . CARPAL TUNNEL SYNDROME  . ESSENTIAL HYPERTENSION, BENIGN  . C O P D  . PEPTIC ULCER DISEASE  . OSTEOARTHRITIS, HANDS, BILATERAL  . SHOULDER PAIN, LEFT, CHRONIC  . OBSTRUCTIVE SLEEP APNEA  . SNORING  . PALPITATIONS, HX OF  . NEPHROLITHIASIS, HX OF  . DEPRESSION  . Prostate cancer screening  . Wound, open, foot   Past Medical History  Diagnosis Date  . Testosterone deficiency   . Snoring   . Fatigue   . Hypopotassemia   . Leg cramps   .  Carpal tunnel syndrome   . Arthritis     osteoarthritis both hands  . Palpitations     Hx of   . Shoulder pain, left   . Hypercholesteremia   . Ulcer     peptic ulcer disease  . Nephrolithiasis     Hx of  . Diabetes mellitus     type II  . Anxiety    Past Surgical History  Procedure Date  . Back surgery     x2 disc   History  Substance Use Topics  . Smoking status: Former Smoker    Quit date: 01/14/1993  . Smokeless tobacco: Never Used  . Alcohol Use: No   Family History  Problem Relation Age of Onset  . Hypertension Mother   . Hypertension Father   . Cancer Sister   . Cancer Brother      kidney  . Heart disease Brother     MI   Allergies  Allergen Reactions  . Aspirin     REACTION: GI upset  . Buspirone Hcl     REACTION: itching  . Crestor (Rosuvastatin Calcium)     Increased his blood sugar   . Prednisone     REACTION: sick   Current Outpatient Prescriptions on File Prior to Visit  Medication Sig Dispense Refill  . ALPRAZolam (XANAX) 0.5 MG tablet Take 1 tablet (0.5 mg total) by mouth 2 (two) times daily as needed for anxiety.  60 tablet  3  . bisoprolol (ZEBETA) 5 MG tablet Take 1 tablet (5 mg total) by mouth daily.  30 tablet  6  . cyclobenzaprine (FLEXERIL) 10 MG tablet Take 10 mg by mouth 3 (three) times daily as needed. For muscle spasms       . lisinopril (PRINIVIL,ZESTRIL) 5 MG tablet Take 5 mg by mouth daily.        . Misc Natural Products (GREEN TEA SLIM) TABS Take 1 tablet by mouth at bedtime.        Marland Kitchen PARoxetine (PAXIL) 40 MG tablet Take 40 mg by mouth every morning.        . potassium chloride (KLOR-CON) 20 MEQ packet Take 40 mEq by mouth daily.        . traMADol (ULTRAM) 50 MG tablet Take 50 mg by mouth every 6 (six) hours as needed. For pain      . travoprost, benzalkonium, (TRAVATAN) 0.004 % ophthalmic solution Place 1 drop into both eyes at bedtime.         Current Facility-Administered Medications on File Prior to Visit  Medication Dose Route Frequency Provider Last Rate Last Dose  . 0.9 %  sodium chloride infusion   Intravenous Once Arman Filter, NP 50 mL/hr at 12/31/10 2157    . diphenhydrAMINE (BENADRYL) injection 12.5 mg  12.5 mg Intravenous Once Arman Filter, NP   12.5 mg at 12/31/10 2308  . metoCLOPramide (REGLAN) injection 10 mg  10 mg Intravenous Once Arman Filter, NP   10 mg at 12/31/10 2310  . morphine 4 MG/ML injection 4 mg  4 mg Intravenous Once Arman Filter, NP   4 mg at 12/31/10 2158  . ondansetron (ZOFRAN) injection 4 mg  4 mg Intravenous Once Arman Filter, NP   4 mg at 12/31/10 2157  . DISCONTD: diphenhydrAMINE (BENADRYL) 12.5  MG/5ML elixir 12.5 mg  12.5 mg Oral Once Arman Filter, NP      . DISCONTD: ketorolac (TORADOL) 30 MG/ML injection            (  ER last evening)  Past Medical History, Surgical History, Social History, Family History, and Problem List have been reviewed in EHR and updated if relevant.  Review of Systems:  GEN: above, no fever or chills GI: No n/v/d Pulm: No SOB Cardiac: No chest pain Neuro: Ataxia, Altered Mental status, dropping items, severe headache, slurred speech. Interactive and getting along well at home. Otherwise, the pertinent positives and negatives are listed above and in the HPI, otherwise a full review of systems has been reviewed and is negative unless noted positive.    Physical Examination: Filed Vitals:   01/01/11 0844  BP: 120/82  Pulse: 64  Temp: 98.5 F (36.9 C)  TempSrc: Oral  Height: 5\' 10"  (1.778 m)  Weight: 232 lb 6.4 oz (105.416 kg)  SpO2: 93%    Body mass index is 33.35 kg/(m^2).  Neurologic Exam   GEN: WDWN, NAD, Non-toxic, A & O x 3 HEENT: Atraumatic, Normocephalic. Neck supple. No masses, No LAD. Ears and Nose: No external deformity. CV: RRR, No M/G/R. No JVD. No thrill. No extra heart sounds. PULM: CTA B, no wheezes, crackles, rhonchi. No retractions. No resp. distress. No accessory muscle use. EXTR: No c/c/e NEURO CN 2-12 grossly intact. PERRLA, EOMI. Sensation intact. Decreased ability to dorsiflex B. DTR 2+ throughout. Rhomberg markedly positive. Unable to do heel-shin. Immediate balance difficulty with walking a straight line. Mildly off finger-nose.  PSYCH: Normally interactive. Conversant. Not depressed or anxious appearing.  Calm demeanor.   Grossly ataxic +rhomberg Mild abnormal finger nose  Assessment and Plan: 1. Ataxia   2. Altered mental status   3. Abnormal neurological exam   4. Headache     >45 minutes spent in face to face time with patient, >50% spent in counselling or coordination of care: Markedly abnormal history  and neurological exam, worsening and concern for neurological event. Concern for CVA vs. Other cause of acute changes starting on Saturday with continued worsening. Spoke to Dr. Austin Miles and [facility] Laster will admit, no tele beds now, so will have patient go to ER until bed available and help initiate work-up of the patient in a timely fashion.  Page [phone] when patient arrives to floor.

## 2011-01-01 NOTE — ED Notes (Signed)
Patiet transported to 5506-01.

## 2011-01-01 NOTE — ED Notes (Signed)
Pt back from xray, will transport pt to floor

## 2011-01-01 NOTE — H&P (Signed)
Hospital Admission Note Date: 01/01/2011  Patient name: Scott Gomez           Medical record number: [id number] Date of birth: [birth year]           Age: 76 y.o.   Gender: male    PCP:   Roxy Manns, MD, MD   Chief Complaint:  Ataxia  HPI: Scott Gomez is a 76 y.o. male with past medical history of a hyperlipidemia, diabetes mellitus and hypertension. Patient evaluated in isn't a primary care physician office today for headache and ataxia. And so he was directly admitted from there  for further evaluation. Patient still restarted on the Saturday. When he developed 10/10 headache. Patient said the headache was in his front radiate to the back of his neck, sharp, did not remember anything increases or decreases the pain. Patient came in to the emergency department for further evaluation CT scan was negative. Patient was sent home on some Tylenol for the headache. Patient today went to see his primary care physician and evaluated by Dr. Patsy Lager for his primary care physician Dr. Milinda Antis. Patient was accompanied by his wife and she is mentioning he is mumbling and he is a staggering and is stable since Saturday. Patient did have history of back surgery and he thinks he is about the same but his wife thought his were told that. She mentioned also he was confused and seen rats and bugs. And he was talking all night long out of his head. Patient seemed like he does have dropped feet, but he said he had him since he had his back surgery. Patient sent from his PCP office for further evaluation and to rule out strokes and other acute findings.  Past Medical History: Past Medical History  Diagnosis Date  . Testosterone deficiency   . Snoring   . Fatigue   . Hypopotassemia   . Leg cramps   . Carpal tunnel syndrome   . Arthritis     osteoarthritis both hands  . Palpitations     Hx of   . Shoulder pain, left   . Hypercholesteremia   . Ulcer     peptic ulcer disease  . Nephrolithiasis     Hx  of  . Diabetes mellitus     type II  . Anxiety    Past Surgical History  Procedure Date  . Back surgery     x2 disc    Medications: Prior to Admission medications   Medication Sig Start Date End Date Taking? Authorizing Provider  ALPRAZolam Prudy Feeler) 0.5 MG tablet Take 1 tablet (0.5 mg total) by mouth 2 (two) times daily as needed for anxiety. 11/12/10  Yes Roxy Manns, MD  bisoprolol (ZEBETA) 5 MG tablet Take 1 tablet (5 mg total) by mouth daily. 09/18/10  Yes Roxy Manns, MD  cyclobenzaprine (FLEXERIL) 10 MG tablet Take 10 mg by mouth 3 (three) times daily as needed. For muscle spasms  11/19/10  Yes Roxy Manns, MD  lisinopril (PRINIVIL,ZESTRIL) 5 MG tablet Take 5 mg by mouth daily.     Yes Historical Provider, MD  Misc Natural Products (GREEN TEA SLIM) TABS Take 1 tablet by mouth at bedtime.     Yes Historical Provider, MD  PARoxetine (PAXIL) 40 MG tablet Take 40 mg by mouth every morning.     Yes Historical Provider, MD  potassium chloride SA (K-DUR,KLOR-CON) 20 MEQ tablet Take 20 mEq by mouth daily.     Yes Historical Provider, MD  traMADol Janean Sark)  50 MG tablet Take 50 mg by mouth every 6 (six) hours as needed. For pain   Yes Historical Provider, MD  travoprost, benzalkonium, (TRAVATAN) 0.004 % ophthalmic solution Place 1 drop into both eyes at bedtime.     Yes Historical Provider, MD    Allergies:   Allergies  Allergen Reactions  . Aspirin     REACTION: GI upset  . Buspirone Hcl     REACTION: itching  . Crestor (Rosuvastatin Calcium)     Increased his blood sugar   . Prednisone     REACTION: sick    Social History:  reports that he quit smoking about 17 years ago. He has never used smokeless tobacco. He reports that he does not drink alcohol or use illicit drugs.  Family History: Family History  Problem Relation Age of Onset  . Hypertension Mother   . Hypertension Father   . Cancer Sister   . Cancer Brother     kidney  . Heart disease Brother     MI    Review  of Systems:  Constitutional: negative for anorexia, fevers and sweats Eyes: negative for irritation, redness and visual disturbance Ears, nose, mouth, throat, and face: negative for earaches, epistaxis, nasal congestion and sore throat Respiratory: negative for cough, dyspnea on exertion, sputum and wheezing Cardiovascular: negative for chest pain, dyspnea, lower extremity edema, orthopnea, palpitations and syncope Gastrointestinal: negative for abdominal pain, constipation, diarrhea, melena, nausea and vomiting Genitourinary:negative for dysuria, frequency and hematuria Hematologic/lymphatic: negative for bleeding, easy bruising and lymphadenopathy Musculoskeletal:negative for arthralgias, muscle weakness and stiff joints Neurological: negative for coordination problems, gait problems, headaches and weakness Endocrine: negative for diabetic symptoms including polydipsia, polyuria and weight loss Allergic/Immunologic: negative for anaphylaxis, hay fever and urticaria  Physical Exam: BP 130/71  Pulse 61  Temp(Src) 98.2 F (36.8 C) (Oral)  Resp 20  Ht 5\' 10"  (1.778 m)  Wt 105.235 kg (232 lb)  BMI 33.29 kg/m2  SpO2 95% General appearance: alert, cooperative and no distress  Head: Normocephalic, without obvious abnormality, atraumatic  Eyes: conjunctivae/corneas clear. PERRL, EOM's intact. Fundi benign.  Nose: Nares normal. Septum midline. Mucosa normal. No drainage or sinus tenderness.  Throat: lips, mucosa, and tongue normal; teeth and gums normal  Neck: Supple, no masses, no cervical lymphadenopathy, no JVD appreciated, no meningeal signs Resp: clear to auscultation bilaterally  Chest wall: no tenderness  Cardio: regular rate and rhythm, S1, S2 normal, no murmur, click, rub or gallop  GI: soft, non-tender; bowel sounds normal; no masses, no organomegaly  Extremities: extremities normal, atraumatic, no cyanosis or edema  Skin: Skin color, texture, turgor normal. No rashes or lesions    Neurologic: Alert and oriented X 3, normal strength and tone. Normal symmetric reflexes. Normal coordination and gait  Labs on Admission:   [facility] 12/31/10 2208  NA 140  K 3.6  CL 102  CO2 --  GLUCOSE 124*  BUN 12  CREATININE 0.80  CALCIUM --  MG --  PHOS --   Basename 12/31/10 2208 12/31/10 2136  WBC -- 9.5  NEUTROABS -- 6.7  HGB 12.6* 12.4*  HCT 37.0* 37.0*  MCV -- 85.6  PLT -- 233   Radiological Exams on Admission: Ct Head Wo Contrast  12/31/2010  *RADIOLOGY REPORT*  Clinical Data: Headache  CT HEAD WITHOUT CONTRAST  Technique:  Contiguous axial images were obtained from the base of the skull through the vertex without contrast.  Comparison: None.  Findings: There is no evidence for acute hemorrhage, hydrocephalus,  mass lesion, or abnormal extra-axial fluid collection.  No definite CT evidence for acute infarction.  The visualized paranasal sinuses and mastoid air cells are predominately clear.  IMPRESSION: No acute intracranial abnormality.  Original Report Authenticated By: Waneta Martins, M.D.     IMPRESSION: Present on Admission:  .HYPERCHOLESTEROLEMIA .Essential hypertension, benign .Ataxia .Headache .OBSTRUCTIVE SLEEP APNEA  Assessment/Plan  1. Ataxia and headache. Patient will be admitted to the hospital for further evaluation. MRI of the brain will be obtained to rule out acute strokes and other acute findings like a hemorrhages. Patient having he is at his baseline but he according to his wife he is confused for the past 2 days. If the MRI is positive for stroke will order the rest of the stroke workup. Upon admission patient does not have focal neurological deficits except dropped feet.  2. Headache: MRI to rule out a stroke and subarachnoid hemorrhage. I will order also cervical x-rays to rule out cervical arthropathy and cervical tension headache. Will start patient on narcotics medication to control his headache. Probably that the come with price of  confusion.  3. Hypercholesterolemia: We'll check fasting lipid profile.  4. HTN: Restart home medications.  5. DM 2: Patient does not seem to have any oral hypoglycemic agents. The last time he had hemoglobin A1c about 2 months ago it tested 6.1. I will check the HbA1c again. I'll put patient on SSI and carbohydrate modified diet.  Seairra Otani A 01/01/2011, 4:23 PM

## 2011-01-01 NOTE — ED Notes (Signed)
Pt to MRI

## 2011-01-01 NOTE — ED Notes (Signed)
NO new rx given, pt voiced understanding to f/u with PCP tomorrow.

## 2011-01-01 NOTE — ED Notes (Signed)
Patient off unit to xray

## 2011-01-01 NOTE — ED Notes (Signed)
To be admitted  For headaches. Pt. Had a CT done yesterday which was normal , but continues to headaches

## 2011-01-01 NOTE — ED Notes (Signed)
Meal tray ordered 

## 2011-01-01 NOTE — ED Notes (Signed)
Report was given to 5500 but was informed that pt can not go to that room, bed controlled notified and will change bed assignment

## 2011-01-02 ENCOUNTER — Inpatient Hospital Stay (HOSPITAL_COMMUNITY): Payer: Medicare Other

## 2011-01-02 LAB — GLUCOSE, CAPILLARY
Glucose-Capillary: 125 mg/dL — ABNORMAL HIGH (ref 70–99)
Glucose-Capillary: 169 mg/dL — ABNORMAL HIGH (ref 70–99)
Glucose-Capillary: 134 mg/dL — ABNORMAL HIGH (ref 70–99)

## 2011-01-02 LAB — CBC
HCT: 35.5 % — ABNORMAL LOW (ref 39.0–52.0)
Hemoglobin: 11.8 g/dL — ABNORMAL LOW (ref 13.0–17.0)
MCH: 28.7 pg (ref 26.0–34.0)
MCHC: 33.2 g/dL (ref 30.0–36.0)
MCV: 86.4 fL (ref 78.0–100.0)
RBC: 4.11 MIL/uL — ABNORMAL LOW (ref 4.22–5.81)

## 2011-01-02 LAB — LIPID PANEL
LDL Cholesterol: 95 mg/dL (ref 0–99)
Total CHOL/HDL Ratio: 4.8 RATIO
VLDL: 22 mg/dL (ref 0–40)

## 2011-01-02 LAB — URINE CULTURE

## 2011-01-02 LAB — BASIC METABOLIC PANEL
BUN: 13 mg/dL (ref 6–23)
CO2: 29 mEq/L (ref 19–32)
GFR calc non Af Amer: 81 mL/min — ABNORMAL LOW (ref >90)
Glucose, Bld: 122 mg/dL — ABNORMAL HIGH (ref 70–99)
Potassium: 3.4 mEq/L — ABNORMAL LOW (ref 3.5–5.1)

## 2011-01-02 LAB — HEMOGLOBIN A1C: Hgb A1c MFr Bld: 6 % — ABNORMAL HIGH (ref <5.7)

## 2011-01-02 MED ORDER — POTASSIUM CHLORIDE CRYS ER 20 MEQ PO TBCR: 40.0000 meq | EXTENDED_RELEASE_TABLET | Freq: Once | ORAL | Status: DC

## 2011-01-02 MED ORDER — OXYCODONE HCL 5 MG PO TABS
5.0000 mg | ORAL_TABLET | Freq: Three times a day (TID) | ORAL | Status: DC | PRN
  Administered 2011-01-02 – 2011-01-03 (×2): 5 mg via ORAL
  Filled 2011-01-02 (×2): qty 1

## 2011-01-02 NOTE — ED Provider Notes (Signed)
Medical screening examination/treatment/procedure(s) were performed by non-physician practitioner and as supervising physician I was immediately available for consultation/collaboration.   Geoffery Lyons, MD 01/02/11 [phone]

## 2011-01-02 NOTE — Progress Notes (Signed)
Subjective: Chart reviewed. Patient complains off pain in the back of his neck and back of his head. He is unable to say whether it is better than yesterday. He denies any limb weakness or tingling or numbness or asymmetry. He says his ambulated steadily to the bathroom which the nurses collaborate. Denies photophobia. Per nursing the patient has been coherent.  Objective: Blood pressure 144/77, pulse 48, temperature 98.2 F (36.8 C), temperature source Oral, resp. rate 19, height 5\' 6"  (1.676 m), weight 105.6 kg (232 lb 12.9 oz), SpO2 96.00%.  Intake/Output Summary (Last 24 hours) at 01/02/11 1400 Last data filed at 01/02/11 0900  Gross per 24 hour  Intake    358 ml  Output      0 ml  Net    358 ml   General exam: Comfortable. Respiratory system: Clear. Cardiovascular system: First and second heart sounds heard, regular. Gastrointestinal system: Abdomen is nondistended, soft and normal bowel sounds heard. Central nervous system: Alert and oriented. No focal neurological deficits. Neck supple.  Lab Results: Basic Metabolic Panel:  Basename 01/02/11 0625 12/31/10 2208  NA 142 140  K 3.4* 3.6  CL 104 102  CO2 29 --  GLUCOSE 122* 124*  BUN 13 12  CREATININE 0.91 0.80  CALCIUM 9.3 --  MG -- --  PHOS -- --   CBC:  Basename 01/02/11 0625 12/31/10 2208 12/31/10 2136  WBC 8.3 -- 9.5  NEUTROABS -- -- 6.7  HGB 11.8* 12.6* --  HCT 35.5* 37.0* --  MCV 86.4 -- 85.6  PLT 218 -- 233   CBG:  Basename 01/02/11 1254 01/02/11 0947 01/02/11 0742 01/01/11 2237  GLUCAP 84 169* 125* 136*   Hemoglobin A1C:  Basename 01/01/11 1740  HGBA1C 6.0*   Fasting Lipid Panel:  Basename 01/02/11 0625  CHOL 148  HDL 31*  LDLCALC 95  TRIG 161  CHOLHDL 4.8  LDLDIRECT --   Coagulation:  Basename 12/31/10 2136  LABPROT 14.9  INR 1.15    Studies/Results: Dg Cervical Spine 2-3 Views  01/01/2011  *RADIOLOGY REPORT*  Clinical Data: Neck pain and headaches for the past week. No trauma  history submitted.  CERVICAL SPINE - 2-3 VIEW  Comparison: None.  Findings: The lateral view images through the bottom of C7. Prevertebral soft tissues are within normal limits.  Moderate osteopenia.  C4-C5 and C6-C7 moderate to severe spondylosis/degenerative disc disease.  Mild C5-C6 degenerative disc disease.  T1 vertebral body height grossly maintained.  Mild straightening of expected cervical lordosis.  Facet arthropathy, primarily at C6-C7.  C1-C2 is suboptimally evaluated on open mouth view.  IMPRESSION: Mid to lower lumbar spondylosis, without acute finding.  Nonspecific straightening of expected lordosis.  Suboptimal evaluation of C7-T1 and C1-C2.  Original Report Authenticated By: Consuello Bossier, M.D.   Ct Head Wo Contrast  12/31/2010  *RADIOLOGY REPORT*  Clinical Data: Headache  CT HEAD WITHOUT CONTRAST  Technique:  Contiguous axial images were obtained from the base of the skull through the vertex without contrast.  Comparison: None.  Findings: There is no evidence for acute hemorrhage, hydrocephalus, mass lesion, or abnormal extra-axial fluid collection.  No definite CT evidence for acute infarction.  The visualized paranasal sinuses and mastoid air cells are predominately clear.  IMPRESSION: No acute intracranial abnormality.  Original Report Authenticated By: Waneta Martins, M.D.   Mr Brain Wo Contrast  01/02/2011  *RADIOLOGY REPORT*  Clinical Data: Ataxia.  Headache.  Diabetic.  Hypertensive.  MRI HEAD WITHOUT CONTRAST  Technique:  Multiplanar,  multiecho pulse sequences of the brain and surrounding structures were obtained according to standard protocol without intravenous contrast.  Comparison: None.  Findings: No acute infarct.  No intracranial hemorrhage.  No intracranial mass lesion detected on this unenhanced exam.  Mild small vessel disease type changes.  Mild atrophy without hydrocephalus.  Major intracranial vascular structures are patent.  Cervical spondylotic changes with  suggestion of spinal stenosis and cord flattening C3-4 level incompletely assessed on the present exam.  Polypoid opacification inferior aspect of the right maxillary sinus.  IMPRESSION: No acute infarct.  Mild small vessel disease type changes.  Mild atrophy without hydrocephalus.  Cervical spondylotic changes with suggestion of spinal stenosis and cord flattening C3-4 level incompletely assessed on the present exam.  Original Report Authenticated By: Fuller Canada, M.D.    Medications: Scheduled Meds:   . aspirin EC  325 mg Oral Daily  . bisoprolol  5 mg Oral Daily  . enoxaparin  40 mg Subcutaneous QHS  . insulin aspart  0-9 Units Subcutaneous TID WC  . lisinopril  5 mg Oral Daily  . PARoxetine  40 mg Oral Q0700  . potassium chloride SA  20 mEq Oral Daily  . travoprost (benzalkonium)  1 drop Both Eyes QHS   Continuous Infusions:  PRN Meds:.acetaminophen, acetaminophen, ALPRAZolam, cyclobenzaprine, morphine, oxyCODONE  Assessment/Plan: 1. Headache and neck pain: MRI of the head does not reveal a stroke. Discussed with radiology who indicated that due to motion artifact the neck is not clearly visible and recommend an MRI of the C-spine. We'll also check influenza PCR.   2. Altered mental status: Unclear etiology. Question secondary to medications. Seems to have resolved. Monitor. Minimize opioids. 3. Hypokalemia: Replete and follow 4. Anemia follow CBCs tomorrow 5. Hypertension: Controlled   HONGALGI,ANAND 01/02/2011, 2:00 PM

## 2011-01-03 LAB — CBC
HCT: 36 % — ABNORMAL LOW (ref 39.0–52.0)
Hemoglobin: 12.1 g/dL — ABNORMAL LOW (ref 13.0–17.0)
MCHC: 33.6 g/dL (ref 30.0–36.0)
MCV: 86.3 fL (ref 78.0–100.0)
RDW: 13.1 % (ref 11.5–15.5)

## 2011-01-03 LAB — BASIC METABOLIC PANEL
BUN: 14 mg/dL (ref 6–23)
CO2: 27 mEq/L (ref 19–32)
Chloride: 102 mEq/L (ref 96–112)
Creatinine, Ser: 0.86 mg/dL (ref 0.50–1.35)
GFR calc Af Amer: >90 mL/min (ref >90)
Potassium: 3.6 mEq/L (ref 3.5–5.1)

## 2011-01-03 LAB — GLUCOSE, CAPILLARY: Glucose-Capillary: 124 mg/dL — ABNORMAL HIGH (ref 70–99)

## 2011-01-03 MED ORDER — PREDNISONE 5 MG PO KIT: PACK | ORAL | Status: DC

## 2011-01-03 NOTE — Progress Notes (Signed)
Fuller Canada to be D/C'd Home per MD order.  Discussed with the patient and all questions fully answered.   Gurshaan, [place]  Home Medication Instructions [id number]   Printed on:01/03/11 1255  Medication Information                    lisinopril (PRINIVIL,ZESTRIL) 5 MG tablet Take 5 mg by mouth daily.             PARoxetine (PAXIL) 40 MG tablet Take 40 mg by mouth every morning.             traMADol (ULTRAM) 50 MG tablet Take 50 mg by mouth every 6 (six) hours as needed. For pain           travoprost, benzalkonium, (TRAVATAN) 0.004 % ophthalmic solution Place 1 drop into both eyes at bedtime.             bisoprolol (ZEBETA) 5 MG tablet Take 1 tablet (5 mg total) by mouth daily.           ALPRAZolam (XANAX) 0.5 MG tablet Take 1 tablet (0.5 mg total) by mouth 2 (two) times daily as needed for anxiety.           Misc Natural Products (GREEN TEA SLIM) TABS Take 1 tablet by mouth at bedtime.             cyclobenzaprine (FLEXERIL) 10 MG tablet Take 10 mg by mouth 3 (three) times daily as needed. For muscle spasms            potassium chloride SA (K-DUR,KLOR-CON) 20 MEQ tablet Take 20 mEq by mouth daily.             PredniSONE (STERAPRED) 5 MG KIT Use as directed.  Pharmacy: Dispense 21 tablet pack.             VVS, Skin clean, dry and intact without evidence of skin break down, no evidence of skin tears noted. IV catheter discontinued intact. Site without signs and symptoms of complications. Dressing and pressure applied.  An After Visit Summary was printed and given to the patient. Patient escorted via WC, and D/C home via private auto.  Debbora Presto 01/03/2011 12:55 PM

## 2011-01-03 NOTE — Discharge Summary (Signed)
Discharge Summary  Scott Gomez MR#: [id number]  DOB:[birth year]  Date of Admission: 01/01/2011 Date of Discharge: 01/03/2011  Patient's PCP: Roxy Manns, MD, MD  Attending Physician:Keyari Kleeman  Pain M.D.: Dr. Thyra Breed  Consults: None  Discharge Diagnoses: 1. Headache and neck pain:? Cervicogenic. 2. Transient altered mental status,? Related to medications. Resolved 3. Hypokalemia 4. Anemia 5. Hypertension 6. Hyperlipidemia 7. Diet-controlled diabetes   Brief Admitting History and Physical Patient is a 76 year old male with history of diet controlled diabetes, hyperlipidemia, arthritis and chronic pain for which he sees a pain M.D. who was sent from the primary care physician's office for complaints of headache and ataxia. His headache he said started about 5 days ago. He indicates pain in the back of his neck and back of his head. The primary care physician is concerned about a stroke. He also had some gait ataxia and confusion. He does have history of foot drop for years.  Discharge Medications Current Discharge Medication List    START taking these medications   Details  PredniSONE (STERAPRED) 5 MG KIT Use as directed.  Pharmacy: Dispense 21 tablet pack. Qty: 1 kit, Refills: 0      CONTINUE these medications which have NOT CHANGED   Details  ALPRAZolam (XANAX) 0.5 MG tablet Take 1 tablet (0.5 mg total) by mouth 2 (two) times daily as needed for anxiety. Qty: 60 tablet, Refills: 3    bisoprolol (ZEBETA) 5 MG tablet Take 1 tablet (5 mg total) by mouth daily. Qty: 30 tablet, Refills: 6    cyclobenzaprine (FLEXERIL) 10 MG tablet Take 10 mg by mouth 3 (three) times daily as needed. For muscle spasms     lisinopril (PRINIVIL,ZESTRIL) 5 MG tablet Take 5 mg by mouth daily.      Misc Natural Products (GREEN TEA SLIM) TABS Take 1 tablet by mouth at bedtime.      PARoxetine (PAXIL) 40 MG tablet Take 40 mg by mouth every morning.      potassium chloride SA  (K-DUR,KLOR-CON) 20 MEQ tablet Take 20 mEq by mouth daily.      traMADol (ULTRAM) 50 MG tablet Take 50 mg by mouth every 6 (six) hours as needed. For pain    travoprost, benzalkonium, (TRAVATAN) 0.004 % ophthalmic solution Place 1 drop into both eyes at bedtime.          Hospital Course: 1. Headache and neck pain: Patient had CT scan of the head and MRI of the head which did not show a stroke. However there was concern for cervical spinal stenosis. Discussed his care with the radiologist yesterday who recommended a dedicated MRI of the cervical spine to clarify. His cervical MRI results are as below.. Discussed his MRI results with the neurosurgeon on call Dr. Lelon Perla, who reviewed his MRI and indicates that he has cervical spine degenerative disease and may have a disc tear between C3 and 4. He recommended short course of steroid taper and outpatient followup in his office in a couple of weeks from discharge. Patient does not recall taking prednisone. Discussed with his wife Elease Hashimoto who does not recollect an allergic reaction to prednisone. Patient denies any tingling or numbness or weakness of his limbs. 2. Transient altered mental status:? Related to medications. This has resolved. Recommend minimizing his opioids and benzodiazepines as an outpatient. 3. Hypokalemia: Improved 4. Anemia: Stable 5. Hypertension: Reasonably controlled. 6. Type 2 diabetes mellitus: Reasonably controlled with diet.   Day of Discharge BP 144/87  Pulse 101  Temp(Src) 98  F (36.7 C) (Oral)  Resp 22  Ht 5\' 6"  (1.676 m)  Wt 105.6 kg (232 lb 12.9 oz)  BMI 37.58 kg/m2  SpO2 91%  General exam: Comfortable.  Respiratory system: Clear.  Cardiovascular system: First and second heart sounds heard, regular.  Gastrointestinal system: Abdomen is nondistended, soft and normal bowel sounds heard.  Central nervous system: Alert and oriented. No focal neurological deficits.  Neck supple.   Results for orders  placed during the hospital encounter of 01/01/11 (from the past 48 hour(s))  HEMOGLOBIN A1C     Status: Abnormal   Collection Time   01/01/11  5:40 PM      Component Value Range Comment   Hemoglobin A1C 6.0 (*) <5.7 (%)    Mean Plasma Glucose 126 (*) <117 (mg/dL)   URINE CULTURE     Status: Normal   Collection Time   01/01/11  6:49 PM      Component Value Range Comment   Specimen Description URINE, CLEAN CATCH      Special Requests NONE      Setup Time [id number]      Colony Count NO GROWTH      Culture NO GROWTH      Report Status 01/02/2011 FINAL     GLUCOSE, CAPILLARY     Status: Abnormal   Collection Time   01/01/11 10:37 PM      Component Value Range Comment   Glucose-Capillary 136 (*) 70 - 99 (mg/dL)   BASIC METABOLIC PANEL     Status: Abnormal   Collection Time   01/02/11  6:25 AM      Component Value Range Comment   Sodium 142  135 - 145 (mEq/L)    Potassium 3.4 (*) 3.5 - 5.1 (mEq/L)    Chloride 104  96 - 112 (mEq/L)    CO2 29  19 - 32 (mEq/L)    Glucose, Bld 122 (*) 70 - 99 (mg/dL)    BUN 13  6 - 23 (mg/dL)    Creatinine, Ser 9.60  0.50 - 1.35 (mg/dL)    Calcium 9.3  8.4 - 10.5 (mg/dL)    GFR calc non Af Amer 81 (*) >90 (mL/min)    GFR calc Af Amer >90  >90 (mL/min)   CBC     Status: Abnormal   Collection Time   01/02/11  6:25 AM      Component Value Range Comment   WBC 8.3  4.0 - 10.5 (K/uL)    RBC 4.11 (*) 4.22 - 5.81 (MIL/uL)    Hemoglobin 11.8 (*) 13.0 - 17.0 (g/dL)    HCT 45.4 (*) 09.8 - 52.0 (%)    MCV 86.4  78.0 - 100.0 (fL)    MCH 28.7  26.0 - 34.0 (pg)    MCHC 33.2  30.0 - 36.0 (g/dL)    RDW 11.9  14.7 - 82.9 (%)    Platelets 218  150 - 400 (K/uL)   LIPID PANEL     Status: Abnormal   Collection Time   01/02/11  6:25 AM      Component Value Range Comment   Cholesterol 148  0 - 200 (mg/dL)    Triglycerides 562  <150 (mg/dL)    HDL 31 (*) >13 (mg/dL)    Total CHOL/HDL Ratio 4.8      VLDL 22  0 - 40 (mg/dL)    LDL Cholesterol 95  0 - 99  (mg/dL)   GLUCOSE, CAPILLARY     Status: Abnormal  Collection Time   01/02/11  7:42 AM      Component Value Range Comment   Glucose-Capillary 125 (*) 70 - 99 (mg/dL)   GLUCOSE, CAPILLARY     Status: Abnormal   Collection Time   01/02/11  9:47 AM      Component Value Range Comment   Glucose-Capillary 169 (*) 70 - 99 (mg/dL)   GLUCOSE, CAPILLARY     Status: Normal   Collection Time   01/02/11 12:54 PM      Component Value Range Comment   Glucose-Capillary 84  70 - 99 (mg/dL)   GLUCOSE, CAPILLARY     Status: Abnormal   Collection Time   01/02/11  5:53 PM      Component Value Range Comment   Glucose-Capillary 140 (*) 70 - 99 (mg/dL)   INFLUENZA PANEL BY PCR     Status: Normal   Collection Time   01/02/11  7:25 PM      Component Value Range Comment   Influenza A By PCR NEGATIVE  NEGATIVE     Influenza B By PCR NEGATIVE  NEGATIVE     H1N1 flu by pcr NOT DETECTED  NOT DETECTED    GLUCOSE, CAPILLARY     Status: Abnormal   Collection Time   01/02/11 10:30 PM      Component Value Range Comment   Glucose-Capillary 134 (*) 70 - 99 (mg/dL)   BASIC METABOLIC PANEL     Status: Abnormal   Collection Time   01/03/11  6:15 AM      Component Value Range Comment   Sodium 138  135 - 145 (mEq/L)    Potassium 3.6  3.5 - 5.1 (mEq/L)    Chloride 102  96 - 112 (mEq/L)    CO2 27  19 - 32 (mEq/L)    Glucose, Bld 118 (*) 70 - 99 (mg/dL)    BUN 14  6 - 23 (mg/dL)    Creatinine, Ser 1.61  0.50 - 1.35 (mg/dL)    Calcium 9.0  8.4 - 10.5 (mg/dL)    GFR calc non Af Amer 83 (*) >90 (mL/min)    GFR calc Af Amer >90  >90 (mL/min)   CBC     Status: Abnormal   Collection Time   01/03/11  6:15 AM      Component Value Range Comment   WBC 8.0  4.0 - 10.5 (K/uL)    RBC 4.17 (*) 4.22 - 5.81 (MIL/uL)    Hemoglobin 12.1 (*) 13.0 - 17.0 (g/dL)    HCT 09.6 (*) 04.5 - 52.0 (%)    MCV 86.3  78.0 - 100.0 (fL)    MCH 29.0  26.0 - 34.0 (pg)    MCHC 33.6  30.0 - 36.0 (g/dL)    RDW 40.9  81.1 - 91.4 (%)     Platelets 226  150 - 400 (K/uL)   GLUCOSE, CAPILLARY     Status: Abnormal   Collection Time   01/03/11  7:36 AM      Component Value Range Comment   Glucose-Capillary 124 (*) 70 - 99 (mg/dL)   GLUCOSE, CAPILLARY     Status: Abnormal   Collection Time   01/03/11 12:02 PM      Component Value Range Comment   Glucose-Capillary 118 (*) 70 - 99 (mg/dL)     Dg Cervical Spine 2-3 Views  01/01/2011  *RADIOLOGY REPORT*  Clinical Data: Neck pain and headaches for the past week. No trauma history submitted.  CERVICAL SPINE -  2-3 VIEW  Comparison: None.  Findings: The lateral view images through the bottom of C7. Prevertebral soft tissues are within normal limits.  Moderate osteopenia.  C4-C5 and C6-C7 moderate to severe spondylosis/degenerative disc disease.  Mild C5-C6 degenerative disc disease.  T1 vertebral body height grossly maintained.  Mild straightening of expected cervical lordosis.  Facet arthropathy, primarily at C6-C7.  C1-C2 is suboptimally evaluated on open mouth view.  IMPRESSION: Mid to lower lumbar spondylosis, without acute finding.  Nonspecific straightening of expected lordosis.  Suboptimal evaluation of C7-T1 and C1-C2.  Original Report Authenticated By: Consuello Bossier, M.D.   Ct Head Wo Contrast  12/31/2010  *RADIOLOGY REPORT*  Clinical Data: Headache  CT HEAD WITHOUT CONTRAST  Technique:  Contiguous axial images were obtained from the base of the skull through the vertex without contrast.  Comparison: None.  Findings: There is no evidence for acute hemorrhage, hydrocephalus, mass lesion, or abnormal extra-axial fluid collection.  No definite CT evidence for acute infarction.  The visualized paranasal sinuses and mastoid air cells are predominately clear.  IMPRESSION: No acute intracranial abnormality.  Original Report Authenticated By: Waneta Martins, M.D.   Mr Brain Wo Contrast  01/02/2011  *RADIOLOGY REPORT*  Clinical Data: Ataxia.  Headache.  Diabetic.  Hypertensive.  MRI  HEAD WITHOUT CONTRAST  Technique:  Multiplanar, multiecho pulse sequences of the brain and surrounding structures were obtained according to standard protocol without intravenous contrast.  Comparison: None.  Findings: No acute infarct.  No intracranial hemorrhage.  No intracranial mass lesion detected on this unenhanced exam.  Mild small vessel disease type changes.  Mild atrophy without hydrocephalus.  Major intracranial vascular structures are patent.  Cervical spondylotic changes with suggestion of spinal stenosis and cord flattening C3-4 level incompletely assessed on the present exam.  Polypoid opacification inferior aspect of the right maxillary sinus.  IMPRESSION: No acute infarct.  Mild small vessel disease type changes.  Mild atrophy without hydrocephalus.  Cervical spondylotic changes with suggestion of spinal stenosis and cord flattening C3-4 level incompletely assessed on the present exam.  Original Report Authenticated By: Fuller Canada, M.D.   Mr Cervical Spine Wo Contrast  01/03/2011  *RADIOLOGY REPORT*  Clinical Data: Headache.  Neck pain.  Cervical spinal stenosis.  MRI CERVICAL SPINE WITHOUT CONTRAST  Technique:  Multiplanar and multiecho pulse sequences of the cervical spine, to include the craniocervical junction and cervicothoracic junction, were obtained according to standard protocol without intravenous contrast.  Comparison: 01/01/2011 radiographs.  Findings: 3 mm of anterolisthesis of C7 on T1 is present.  The cervical cord demonstrates no intramedullary edema or intramedullary lesion.  There are flow voids present in both vertebral arteries.  There is a large C5 vertebral body hemangioma. Bone marrow signal shows suppression of fatty marrow, which is a nonspecific finding, commonly associated with anemia, chronic disease, cigarette smoking, and obesity.  Posterior fossa structures grossly appear within normal limits. Craniocervical alignment is normal.  The paraspinal soft tissues  are normal.  C2-C3:  Left eccentric disc bulging with mild left foraminal encroachment.  C3-C4:  Left paracentral disc extrusion is present with cranial migration of disc material.  Posterior ligamentum flavum redundancy is present.  Moderate central stenosis is present which is multifactorial.  AP diameter of the thecal sac is 8 mm. Flattening the ventral cervical cord.  Bilateral symmetric foraminal stenosis associated with uncovertebral spurring and facet disease.  C4-C5:  Disc desiccation and loss of height.  Left greater than right bilateral foraminal stenosis secondary uncovertebral  spurring. Severe central stenosis associated with broad-based disc osteophyte complex and posterior ligamentum flavum redundancy.  C5-C6:  Severe central stenosis.  Broad-based disc osteophyte complex is left eccentric with left paracentral protrusion. Posterior ligamentum flavum redundancy. Bilateral foraminal stenosis secondary to uncovertebral spurring.  C6-C7:  Moderate to severe central stenosis with left eccentric disc osteophyte complex.  Bilateral right greater than left foraminal stenosis secondary uncovertebral spurring and facet degeneration.  C7-T1:  Grade 1 anterolisthesis.  Mild central stenosis.  Mild bilateral foraminal stenosis associated with slip and degenerative facet arthrosis.  Facet disease accounts for anterolisthesis.  IMPRESSION:  Multilevel cervical spondylosis with spinal stenosis extending from C4-C5 through C6-C7, severe at C4-C5 and C5-C6 as well as C6-C7 with effacement or near effacement of the subarachnoid space and flattening of the cervical cord.  Stenosis is multifactorial associated with disc osteophyte complex and posterior ligamentum flavum redundancy.  Multilevel bilateral foraminal stenosis associated with uncovertebral spurring and facet degeneration.  Original Report Authenticated By: Andreas Newport, M.D.     Disposition:discharge home in stable condition   Diet:  heart  healthy  Activity:  increase activity gradually   Follow-up Appts: Discharge Orders    Future Appointments: Provider: Department: Dept Phone: Center:   05/06/2011 8:40 AM [facility] [phone] [id number]   05/13/2011 8:30 AM Roxy Manns, MD [facility] [phone] [id number]     Future Orders Please Complete By Expires   Diet - low sodium heart healthy      Increase activity slowly      Call MD for:  severe uncontrolled pain         TESTS THAT NEED FOLLOW-UP None  Time spent on discharge, talking to the patient, and coordinating care: 30 mins.   SignedMarcellus Scott, MD 01/03/2011, 12:25 PM

## 2011-01-04 LAB — GLUCOSE, CAPILLARY: Glucose-Capillary: 123 mg/dL — ABNORMAL HIGH (ref 70–99)

## 2011-01-09 NOTE — Progress Notes (Signed)
Utilization review complete 

## 2011-01-09 NOTE — Progress Notes (Signed)
   CARE MANAGEMENT NOTE 01/09/2011  Patient:  Scott Gomez, Scott Gomez   Account Number:  [id number]  Date Initiated:  01/02/2011  Documentation initiated by:  Donn Pierini  Subjective/Objective Assessment:   Pt admitted with r/o CVA     Action/Plan:   PTA pt lived at home with spouse, was independent with ADLs-   Anticipated DC Date:  01/04/2011   Anticipated DC Plan:  HOME W HOME HEALTH SERVICES      DC Planning Services  CM consult      Choice offered to / List presented to:             Status of service:  Completed, signed off Medicare Important Message given?   (If response is "NO", the following Medicare IM given date fields will be blank) Date Medicare IM given:   Date Additional Medicare IM given:    Discharge Disposition:  HOME/SELF CARE  Per UR Regulation:    Comments:  PCP- [name]  01/09/11 14:36 Letha Cape RN, BSN [phone] Pateint dc to home.

## 2011-02-21 ENCOUNTER — Telehealth: Payer: Self-pay

## 2011-02-21 MED ORDER — LISINOPRIL 5 MG PO TABS: 5.0000 mg | ORAL_TABLET | Freq: Every day | ORAL | Status: DC

## 2011-02-21 NOTE — Telephone Encounter (Signed)
Will refill electronically  

## 2011-02-21 NOTE — Telephone Encounter (Signed)
Spoke with pt and he said he is still taking Lisinopril 5 mg one daily.

## 2011-02-21 NOTE — Telephone Encounter (Signed)
Could you call him and check on that? -thanks

## 2011-02-21 NOTE — Telephone Encounter (Signed)
[facility] pharmacy request refill on lisinopril 5 mg. On 01/01/11 ER note Lisinopril had a mark thru it. I was not sure if should refill or not. Please advise.

## 2011-02-22 ENCOUNTER — Other Ambulatory Visit: Payer: Self-pay | Admitting: *Deleted

## 2011-02-22 NOTE — Telephone Encounter (Signed)
Left vm for pt to callback 

## 2011-02-22 NOTE — Telephone Encounter (Signed)
This is for a steroid kit-- ? Why does he need it- or doesn't he  Thanks

## 2011-02-25 NOTE — Telephone Encounter (Signed)
Left vm for pt to callback 

## 2011-02-26 ENCOUNTER — Other Ambulatory Visit: Payer: Medicare Other

## 2011-03-05 ENCOUNTER — Ambulatory Visit: Payer: Medicare Other | Admitting: Family Medicine

## 2011-03-05 NOTE — Telephone Encounter (Signed)
Patient says that he uses prednisone for his headache and will come in for office visit if the needs this medication

## 2011-03-15 ENCOUNTER — Ambulatory Visit: Payer: Medicare Other | Admitting: Family Medicine

## 2011-03-19 ENCOUNTER — Other Ambulatory Visit: Payer: Self-pay | Admitting: *Deleted

## 2011-03-19 MED ORDER — ALPRAZOLAM 0.5 MG PO TABS: 0.5000 mg | ORAL_TABLET | Freq: Two times a day (BID) | ORAL | Status: DC | PRN

## 2011-03-19 MED ORDER — PAROXETINE HCL 40 MG PO TABS: 40.0000 mg | ORAL_TABLET | ORAL | Status: DC

## 2011-03-19 NOTE — Telephone Encounter (Signed)
Faxed request from [facility], last filled 02/21/11.

## 2011-03-19 NOTE — Telephone Encounter (Signed)
Px written for call in   

## 2011-03-20 NOTE — Telephone Encounter (Signed)
Meds called to [facility].

## 2011-04-15 ENCOUNTER — Other Ambulatory Visit: Payer: Self-pay | Admitting: *Deleted

## 2011-04-15 MED ORDER — BISOPROLOL FUMARATE 5 MG PO TABS: 5.0000 mg | ORAL_TABLET | Freq: Every day | ORAL | Status: DC

## 2011-04-16 ENCOUNTER — Other Ambulatory Visit: Payer: Self-pay | Admitting: Sports Medicine

## 2011-04-16 DIAGNOSIS — M25512 Pain in left shoulder: Secondary | ICD-10-CM

## 2011-04-19 ENCOUNTER — Ambulatory Visit
Admission: RE | Admit: 2011-04-19 | Discharge: 2011-04-19 | Disposition: A | Discharge status: Home / Self Care | Payer: Medicare Other | Source: Ambulatory Visit | Attending: Sports Medicine | Admitting: Sports Medicine

## 2011-04-19 DIAGNOSIS — M25512 Pain in left shoulder: Secondary | ICD-10-CM

## 2011-05-01 ENCOUNTER — Encounter: Payer: Self-pay | Admitting: Family Medicine

## 2011-05-01 ENCOUNTER — Encounter: Payer: Self-pay | Admitting: Neurology

## 2011-05-01 ENCOUNTER — Ambulatory Visit (INDEPENDENT_AMBULATORY_CARE_PROVIDER_SITE_OTHER): Payer: Medicare Other | Admitting: Family Medicine

## 2011-05-01 VITALS — BP 142/80 | HR 70 | Temp 98.0°F | Ht 68.0 in | Wt 238.0 lb

## 2011-05-01 DIAGNOSIS — Z9181 History of falling: Secondary | ICD-10-CM

## 2011-05-01 DIAGNOSIS — R270 Ataxia, unspecified: Secondary | ICD-10-CM

## 2011-05-01 DIAGNOSIS — R279 Unspecified lack of coordination: Secondary | ICD-10-CM

## 2011-05-01 DIAGNOSIS — R413 Other amnesia: Secondary | ICD-10-CM | POA: Insufficient documentation

## 2011-05-01 DIAGNOSIS — R296 Repeated falls: Secondary | ICD-10-CM

## 2011-05-01 NOTE — Progress Notes (Signed)
Subjective:    Patient ID: Scott Gomez, male    DOB: 04/19/1934, 77 y.o.   MRN: [id number]  HPI Here for hx of falls  More often now   This am -- was was standing in bedroom -- was holding cane -- playing with dog - lost balance and fell  Usually falls to the side -- on one arm or the other  No dizziness at all  Lately walks with a cane R side   If she stands up from a table - tends to fall backwards   Has never used or needed a walker   bp is 142/80 Wt is up 6 lb with bmi of 36  Hx of ataxia neurol changes  Is on sedating meds Xanax- takes regularly  Ultram- uses that - takes 2 pills 4 times per day - for the L shoulder injury  Flexeril-- recently cut dosage- Dr Vear Clock cut it (pain doctor)   Has hx of CS cord compression  No weakness or numbness   12/19 had a MR brain  Small vessel change/ atrophy - age related  Used to have headaches   His wife notices that he forgets and repeats himself -- family notices  Parents had dementia also   Patient Active Problem List  Diagnoses  . DIABETES MELLITUS, TYPE II  . TESTOSTERONE DEFICIENCY  . HYPERCHOLESTEROLEMIA  . ANXIETY  . CARPAL TUNNEL SYNDROME  . Essential hypertension, benign  . C O P D  . PEPTIC ULCER DISEASE  . OSTEOARTHRITIS, HANDS, BILATERAL  . SHOULDER PAIN, LEFT, CHRONIC  . OBSTRUCTIVE SLEEP APNEA  . SNORING  . PALPITATIONS, HX OF  . NEPHROLITHIASIS, HX OF  . DEPRESSION  . Prostate cancer screening  . Wound, open, foot  . Ataxia  . Headache  . Falls frequently  . Memory loss   Past Medical History  Diagnosis Date  . Testosterone deficiency   . Snoring   . Fatigue   . Hypopotassemia   . Leg cramps   . Carpal tunnel syndrome   . Arthritis     osteoarthritis both hands  . Palpitations     Hx of   . Shoulder pain, left   . Hypercholesteremia   . Ulcer     peptic ulcer disease  . Nephrolithiasis     Hx of  . Diabetes mellitus     type II  . Anxiety    Past Surgical History    Procedure Date  . Back surgery     x2 disc   History  Substance Use Topics  . Smoking status: Former Smoker    Quit date: 01/14/1993  . Smokeless tobacco: Never Used  . Alcohol Use: No   Family History  Problem Relation Age of Onset  . Hypertension Mother   . Hypertension Father   . Cancer Sister   . Cancer Brother     kidney  . Heart disease Brother     MI   Allergies  Allergen Reactions  . Aspirin     REACTION: GI upset  . Buspirone Hcl     REACTION: itching  . Crestor (Rosuvastatin Calcium)     Increased his blood sugar   . Prednisone     REACTION: sick   Current Outpatient Prescriptions on File Prior to Visit  Medication Sig Dispense Refill  . ALPRAZolam (XANAX) 0.5 MG tablet Take 1 tablet (0.5 mg total) by mouth 2 (two) times daily as needed for anxiety.  60 tablet  3  .  bisoprolol (ZEBETA) 5 MG tablet Take 1 tablet (5 mg total) by mouth daily.  30 tablet  6  . cyclobenzaprine (FLEXERIL) 10 MG tablet Take 10 mg by mouth 3 (three) times daily as needed. For muscle spasms       . lisinopril (PRINIVIL,ZESTRIL) 5 MG tablet Take 1 tablet (5 mg total) by mouth daily.  30 tablet  11  . Misc Natural Products (GREEN TEA SLIM) TABS Take 1 tablet by mouth at bedtime.        Marland Kitchen PARoxetine (PAXIL) 40 MG tablet Take 1 tablet (40 mg total) by mouth every morning.  30 tablet  3  . potassium chloride SA (K-DUR,KLOR-CON) 20 MEQ tablet Take 20 mEq by mouth daily.        . traMADol (ULTRAM) 50 MG tablet Take 50 mg by mouth every 6 (six) hours as needed. For pain      . travoprost, benzalkonium, (TRAVATAN) 0.004 % ophthalmic solution Place 1 drop into both eyes at bedtime.            Review of Systems Review of Systems  Constitutional: Negative for fever, appetite change, fatigue and unexpected weight change.  Eyes: Negative for pain and visual disturbance.  Respiratory: Negative for cough and shortness of breath.   Cardiovascular: Negative for cp or palpitations     Gastrointestinal: Negative for nausea, diarrhea and constipation.  Genitourinary: Negative for urgency and frequency.  Skin: Negative for pallor or rash   Neurological: Negative for, light-headedness, numbness and headaches. pos for poor balance and some short term memory loss pos for gen weakness of ankles with inability to move them well Hematological: Negative for adenopathy. Does not bruise/bleed easily.  Psychiatric/Behavioral: Negative for dysphoric mood. The patient is not nervous/anxious.          Objective:   Physical Exam  Constitutional: He appears well-developed and well-nourished. No distress.       overwt and somewhat frail app with abn gait   HENT:  Head: Normocephalic and atraumatic.  Mouth/Throat: Oropharynx is clear and moist.  Eyes: Conjunctivae and EOM are normal. Pupils are equal, round, and reactive to light. No scleral icterus.  Neck: Normal range of motion. Neck supple. No JVD present. Carotid bruit is not present. No tracheal deviation present. No thyromegaly present.  Cardiovascular: Normal rate, regular rhythm, normal heart sounds and intact distal pulses.   Pulmonary/Chest: Effort normal and breath sounds normal. No respiratory distress. He has no wheezes.  Abdominal: Soft. Bowel sounds are normal. There is no tenderness.  Musculoskeletal: He exhibits no edema and no tenderness.       Poor rom ankles - worse on R with inability to dorsiflex well  Almost a foot drop on R  Poor rom of LS   Lymphadenopathy:    He has no cervical adenopathy.  Neurological: He is alert. He has normal strength and normal reflexes. He displays tremor. He displays no atrophy and normal reflexes. No cranial nerve deficit or sensory deficit. He exhibits normal muscle tone. Gait abnormal. Coordination normal.       Drift backwards on rhomberg Wide based gait without cane- slow and unsteady with poor foot lift-esp in R  Shuffles  In other ext-no cogwheeling noticed   Skin: Skin is  warm and dry. No rash noted. No erythema. No pallor.  Psychiatric: He has a normal mood and affect.       No memory loss noted by myself today  Assessment & Plan:

## 2011-05-01 NOTE — Patient Instructions (Addendum)
You are on several medications that cause falls - especially the tramadol  Talk to your pain doctor this month about the fact you are falling and I am concerned about medicine side effects Use walker instead of cane - all the time  We will hold off on any physical therapy for balance since this did not help We will refer you to neurology for both balance problems/ falls , and also memory loss  I will send this note to your pain doctor too

## 2011-05-02 ENCOUNTER — Encounter: Payer: Self-pay | Admitting: Family Medicine

## 2011-05-06 ENCOUNTER — Other Ambulatory Visit: Payer: Self-pay | Admitting: Physician Assistant

## 2011-05-06 ENCOUNTER — Other Ambulatory Visit (INDEPENDENT_AMBULATORY_CARE_PROVIDER_SITE_OTHER): Payer: Medicare Other

## 2011-05-06 DIAGNOSIS — M542 Cervicalgia: Secondary | ICD-10-CM

## 2011-05-06 DIAGNOSIS — E7800 Pure hypercholesterolemia, unspecified: Secondary | ICD-10-CM

## 2011-05-06 DIAGNOSIS — I10 Essential (primary) hypertension: Secondary | ICD-10-CM

## 2011-05-06 DIAGNOSIS — E119 Type 2 diabetes mellitus without complications: Secondary | ICD-10-CM

## 2011-05-06 LAB — COMPREHENSIVE METABOLIC PANEL
ALT: 16 U/L (ref 0–53)
AST: 17 U/L (ref 0–37)
Albumin: 3.8 g/dL (ref 3.5–5.2)
Alkaline Phosphatase: 70 U/L (ref 39–117)
Glucose, Bld: 134 mg/dL — ABNORMAL HIGH (ref 70–99)
Potassium: 4.4 mEq/L (ref 3.5–5.1)
Sodium: 140 mEq/L (ref 135–145)
Total Bilirubin: 0.4 mg/dL (ref 0.3–1.2)
Total Protein: 7.1 g/dL (ref 6.0–8.3)

## 2011-05-06 LAB — ALT: ALT: 16 U/L (ref 0–53)

## 2011-05-06 LAB — LIPID PANEL
HDL: 35.1 mg/dL — ABNORMAL LOW (ref >39.00)
LDL Cholesterol: 113 mg/dL — ABNORMAL HIGH (ref 0–99)
VLDL: 36.2 mg/dL (ref 0.0–40.0)

## 2011-05-06 LAB — AST: AST: 17 U/L (ref 0–37)

## 2011-05-10 ENCOUNTER — Other Ambulatory Visit: Payer: Self-pay | Admitting: Physician Assistant

## 2011-05-10 ENCOUNTER — Ambulatory Visit
Admission: RE | Admit: 2011-05-10 | Discharge: 2011-05-10 | Disposition: A | Discharge status: Home / Self Care | Payer: Medicare Other | Source: Ambulatory Visit | Attending: Physician Assistant | Admitting: Physician Assistant

## 2011-05-10 DIAGNOSIS — M542 Cervicalgia: Secondary | ICD-10-CM

## 2011-05-10 DIAGNOSIS — R52 Pain, unspecified: Secondary | ICD-10-CM

## 2011-05-13 ENCOUNTER — Encounter: Payer: Self-pay | Admitting: Family Medicine

## 2011-05-13 ENCOUNTER — Ambulatory Visit (INDEPENDENT_AMBULATORY_CARE_PROVIDER_SITE_OTHER): Payer: Medicare Other | Admitting: Family Medicine

## 2011-05-13 VITALS — BP 138/78 | HR 68 | Temp 98.2°F | Ht 68.0 in | Wt 243.8 lb

## 2011-05-13 DIAGNOSIS — I10 Essential (primary) hypertension: Secondary | ICD-10-CM

## 2011-05-13 DIAGNOSIS — E7800 Pure hypercholesterolemia, unspecified: Secondary | ICD-10-CM

## 2011-05-13 DIAGNOSIS — E119 Type 2 diabetes mellitus without complications: Secondary | ICD-10-CM

## 2011-05-13 DIAGNOSIS — E669 Obesity, unspecified: Secondary | ICD-10-CM

## 2011-05-13 NOTE — Assessment & Plan Note (Signed)
Fairly controlled with diet as he cannot take statins Disc goals for lipids and reasons to control them Rev labs with pt Rev low sat fat diet in detail

## 2011-05-13 NOTE — Assessment & Plan Note (Signed)
Discussed how this problem influences overall health and the risks it imposes  Reviewed plan for weight loss with lower calorie diet (via better food choices and also portion control or program like weight watchers) and exercise building up to or more than 30 minutes 5 days per week including some aerobic activity    

## 2011-05-13 NOTE — Assessment & Plan Note (Signed)
a1c up to 6.5 with diet control -- lately has gained wt with inability to exercise (chronic pain )  Disc portion control and use of exercise bike if able

## 2011-05-13 NOTE — Assessment & Plan Note (Signed)
bp in fair control at this time  No changes needed  Disc lifstyle change with low sodium diet and exercise   

## 2011-05-13 NOTE — Progress Notes (Signed)
Subjective:    Patient ID: Scott Gomez, male    DOB: 02-06-1934, 77 y.o.   MRN: [id number]  HPI Here for f/u of chronic conditions  Last visit disc falling/ memory Disc with his pain doctor - is off pain med  No falls since coming off med  Has had a lot of rad studies - for CS disc problems - goes for f/u today 1:30 Ref to neuro-upcoming   bp is  138/78  Today No cp or palpitations or headaches or edema  No side effects to medicines    Wt is up 5 lb with bmi of 37 Diet - is good overall  Exercise- unable to do much due to chronic pain    DM diet -no sweets , and not a lot of carbs  Exercise -none due to chronic pain  Symptoms-none  A1C last  6.5 up from 6 This is diet controlled  Renal protection-on ace  Last eye exam -- was about feb - no DM retinop (also had cataracts removed )    Lab Results  Component Value Date   CHOL 184 05/06/2011   HDL 35.10* 05/06/2011   LDLCALC 113* 05/06/2011   LDLDIRECT 152.3 11/07/2010   TRIG 181.0* 05/06/2011   CHOLHDL 5 05/06/2011   no fried or fatty foods at all   Patient Active Problem List  Diagnoses  . DIABETES MELLITUS, TYPE II  . TESTOSTERONE DEFICIENCY  . HYPERCHOLESTEROLEMIA  . ANXIETY  . CARPAL TUNNEL SYNDROME  . Essential hypertension, benign  . C O P D  . PEPTIC ULCER DISEASE  . OSTEOARTHRITIS, HANDS, BILATERAL  . SHOULDER PAIN, LEFT, CHRONIC  . OBSTRUCTIVE SLEEP APNEA  . SNORING  . PALPITATIONS, HX OF  . NEPHROLITHIASIS, HX OF  . DEPRESSION  . Prostate cancer screening  . Ataxia  . Headache  . Falls frequently  . Memory loss  . Obesity   Past Medical History  Diagnosis Date  . Testosterone deficiency   . Snoring   . Fatigue   . Hypopotassemia   . Leg cramps   . Carpal tunnel syndrome   . Arthritis     osteoarthritis both hands  . Palpitations     Hx of   . Shoulder pain, left   . Hypercholesteremia   . Ulcer     peptic ulcer disease  . Nephrolithiasis     Hx of  . Diabetes mellitus     type  II  . Anxiety    Past Surgical History  Procedure Date  . Back surgery     x2 disc   History  Substance Use Topics  . Smoking status: Former Smoker    Quit date: 01/14/1993  . Smokeless tobacco: Never Used  . Alcohol Use: No   Family History  Problem Relation Age of Onset  . Hypertension Mother   . Hypertension Father   . Cancer Sister   . Cancer Brother     kidney  . Heart disease Brother     MI   Allergies  Allergen Reactions  . Aspirin     REACTION: GI upset  . Buspirone Hcl     REACTION: itching  . Crestor (Rosuvastatin Calcium)     Increased his blood sugar   . Prednisone     REACTION: sick   Current Outpatient Prescriptions on File Prior to Visit  Medication Sig Dispense Refill  . ALPRAZolam (XANAX) 0.5 MG tablet Take 1 tablet (0.5 mg total) by mouth 2 (two) times  daily as needed for anxiety.  60 tablet  3  . bisoprolol (ZEBETA) 5 MG tablet Take 1 tablet (5 mg total) by mouth daily.  30 tablet  6  . lisinopril (PRINIVIL,ZESTRIL) 5 MG tablet Take 1 tablet (5 mg total) by mouth daily.  30 tablet  11  . PARoxetine (PAXIL) 40 MG tablet Take 1 tablet (40 mg total) by mouth every morning.  30 tablet  3  . potassium chloride SA (K-DUR,KLOR-CON) 20 MEQ tablet Take 20 mEq by mouth daily.        . travoprost, benzalkonium, (TRAVATAN) 0.004 % ophthalmic solution Place 1 drop into both eyes at bedtime.        . cyclobenzaprine (FLEXERIL) 10 MG tablet Take 10 mg by mouth 3 (three) times daily as needed. For muscle spasms       . Misc Natural Products (GREEN TEA SLIM) TABS Take 1 tablet by mouth at bedtime.        . traMADol (ULTRAM) 50 MG tablet Take 50 mg by mouth every 6 (six) hours as needed. For pain          Review of Systems Review of Systems  Constitutional: Negative for fever, appetite change, fatigue and unexpected weight change.  Eyes: Negative for pain and visual disturbance.  Respiratory: Negative for cough and shortness of breath.   Cardiovascular:  Negative for cp or palpitations    Gastrointestinal: Negative for nausea, diarrhea and constipation.  Genitourinary: Negative for urgency and frequency.  Skin: Negative for pallor or rash   MSK pos for chronic pain in several areas  Neurological: Negative for weakness, light-headedness, numbness and headaches.  Hematological: Negative for adenopathy. Does not bruise/bleed easily.  Psychiatric/Behavioral: Negative for dysphoric mood. The patient is generally anxious .         Objective:   Physical Exam  Constitutional: He appears well-developed and well-nourished. No distress.  HENT:  Head: Normocephalic and atraumatic.  Mouth/Throat: Oropharynx is clear and moist.  Eyes: Conjunctivae and EOM are normal. Pupils are equal, round, and reactive to light. Right eye exhibits no discharge. Left eye exhibits no discharge.  Neck: Normal range of motion. Neck supple. No JVD present. Carotid bruit is not present. No thyromegaly present.  Cardiovascular: Normal rate, regular rhythm, normal heart sounds and intact distal pulses.  Exam reveals no gallop.   Pulmonary/Chest: Effort normal and breath sounds normal. No respiratory distress. He has no wheezes.  Abdominal: Soft. Bowel sounds are normal. He exhibits no distension, no abdominal bruit and no mass. There is no tenderness.  Musculoskeletal: He exhibits no edema.       Poor rom spine due to chronic pain/ deg  Lymphadenopathy:    He has no cervical adenopathy.  Neurological: He is alert. He has normal reflexes. No cranial nerve deficit. He exhibits normal muscle tone. Coordination normal.  Skin: Skin is warm and dry. No rash noted. No erythema. No pallor.  Psychiatric: He has a normal mood and affect.          Assessment & Plan:

## 2011-05-13 NOTE — Patient Instructions (Signed)
Try to work on cutting your portions for weight loss  If you can, find an exercise you can do- for fitness/ sugar control / and cholesterol  Using an exercise bike would work best  I'm glad you are not falling  Follow up in 6 months for annual exam with labs prior

## 2011-06-03 ENCOUNTER — Encounter: Payer: Self-pay | Admitting: Neurology

## 2011-06-03 ENCOUNTER — Ambulatory Visit (INDEPENDENT_AMBULATORY_CARE_PROVIDER_SITE_OTHER): Payer: Medicare Other | Admitting: Neurology

## 2011-06-03 VITALS — BP 162/90 | HR 72 | Wt 242.0 lb

## 2011-06-03 DIAGNOSIS — M48061 Spinal stenosis, lumbar region without neurogenic claudication: Secondary | ICD-10-CM

## 2011-06-03 DIAGNOSIS — W19XXXA Unspecified fall, initial encounter: Secondary | ICD-10-CM

## 2011-06-03 NOTE — Patient Instructions (Addendum)
Your MRI is scheduled at [facility] located at [address] in [place] on Wednesday, Jun 05, 2011 at 1:15 pm. Please arrive 30 minutes prior to your appointment time.   [phone].

## 2011-06-03 NOTE — Progress Notes (Signed)
Dear Dr. Milinda Antis,  Thank you for having me see Scott Gomez in consultation today at [facility] for his problem with falls and gait abnormalities as well as memory problems.  As you may recall, he is a 77 y.o. year old male with a history of severe cervical stenosis and severe lumbar stenosis s/p two remote lumbar surgeries with bilateral foot drops who presents with worsening gait in December, that transiently improved and then seem to worsen in early April.  During his worse times he would fall multiple times per day multiple days per week.  He denies loss of consciousness, he denies tripping or difficult surfaces.  He does not complain of numbness in his feet.  He denies significant neck or back pain.  He has not had any urinary incontinence or urgency.  Interestingly, you instructed him to stop his tramadol, and since then(mid April) he has not had any further falls.  However, he denies a recent change in his tramadol other than stopping it.  He has not started taking any other medication before this happened.  He does say that this seemed to start after an admission for headaches and difficulty walking in December.  He has been investigated thoroughly.  MRI brain was unremarkable(which was ordered when he presented with headache and ataxia, late last year), except for noted C3-C4 spinal stenosis.  Cervical spine MRI revealed multilevel cervical cervical stenosis.  A previous MRI L-spine in 2004 revealed severe lumbar stenosis.  His wife also complains of memory problems.  Most of this is him repeating the same stories that he may have told the day before.  Otherwise he is not repeating questions, losing objects, getting lost, forgetting to do tasks.  He has been depressed at the prospective loss of his great niece is who an infant who they are taking care of but is going to be adopted out.  Past Medical History  Diagnosis Date  . Testosterone deficiency   . Snoring   . Fatigue   .  Hypopotassemia   . Leg cramps   . Carpal tunnel syndrome   . Arthritis     osteoarthritis both hands  . Palpitations     Hx of   . Shoulder pain, left   . Hypercholesteremia   . Ulcer     peptic ulcer disease  . Nephrolithiasis     Hx of  . Diabetes mellitus     type II  . Anxiety     Past Surgical History  Procedure Date  . Back surgery     x2 disc    History   Social History  . Marital Status: Married    Spouse Name: N/A    Number of Children: N/A  . Years of Education: N/A   Social History Main Topics  . Smoking status: Former Smoker    Quit date: 01/14/1993  . Smokeless tobacco: Never Used  . Alcohol Use: No  . Drug Use: No  . Sexually Active: None   Other Topics Concern  . None   Social History Narrative  . None    Family History  Problem Relation Age of Onset  . Hypertension Mother   . Hypertension Father   . Cancer Sister   . Cancer Brother     kidney  . Heart disease Brother     MI    Current Outpatient Prescriptions on File Prior to Visit  Medication Sig Dispense Refill  . ALPRAZolam (XANAX) 0.5 MG tablet Take 1  tablet (0.5 mg total) by mouth 2 (two) times daily as needed for anxiety.  60 tablet  3  . bisoprolol (ZEBETA) 5 MG tablet Take 1 tablet (5 mg total) by mouth daily.  30 tablet  6  . cyclobenzaprine (FLEXERIL) 10 MG tablet Take 10 mg by mouth 3 (three) times daily as needed. For muscle spasms       . lisinopril (PRINIVIL,ZESTRIL) 5 MG tablet Take 1 tablet (5 mg total) by mouth daily.  30 tablet  11  . Misc Natural Products (GREEN TEA SLIM) TABS Take 1 tablet by mouth at bedtime.        Marland Kitchen PARoxetine (PAXIL) 40 MG tablet Take 1 tablet (40 mg total) by mouth every morning.  30 tablet  3  . potassium chloride SA (K-DUR,KLOR-CON) 20 MEQ tablet Take 20 mEq by mouth daily.        . travoprost, benzalkonium, (TRAVATAN) 0.004 % ophthalmic solution Place 1 drop into both eyes at bedtime.        . traMADol (ULTRAM) 50 MG tablet Take 50 mg by  mouth every 6 (six) hours as needed. For pain        Allergies  Allergen Reactions  . Aspirin     REACTION: GI upset  . Buspirone Hcl     REACTION: itching  . Crestor (Rosuvastatin Calcium)     Increased his blood sugar   . Prednisone     REACTION: sick, per wife he can take this.      ROS:  13 systems were reviewed and are notable for blurred vision, leg pain, shoulder pain - chronic left rortator cuff injury.  All other review of systems are unremarkable.   Examination:  Filed Vitals:   06/03/11 1138  BP: 162/90  Pulse: 72  Weight: 242 lb (109.77 kg)     In general, unkempt man.  Cardiovascular: The patient has a regular rate and rhythm.  Fundoscopy:  Disks are flat. Vessel caliber within normal limits.  Mental status:   MMSE 27/27  Cranial Nerves: Pupils are equally round and reactive to light. Visual fields full to confrontation. Extraocular movements are intact without nystagmus. Facial sensation and muscles of mastication are intact. Muscles of facial expression are symmetric. Hearing intact to bilateral finger rub. Tongue protrusion, uvula, palate midline.  Shoulder shrug intact  Motor:  The patient has normal bulk and tone, no pronator drift.  There are no adventitious movements.  5/5 muscle strength bilaterally in UE.  LE 5/5 except 2/5 FDF and 4/5 R PF, 4+/5 L PF.  Reflexes:   Biceps  Triceps Brachioradialis Knee Ankle  Right 2+  2+  2+   1+ 0  Left  2+  2+  2+   1+ 0  Toes down  Coordination:  Normal finger to nose.  No dysdiadokinesia.  Sensation is significantly decreased to temperature and vibration as well as position sense in the feet.  Gait and Station are steppage, foot slapping. Romberg is positive.   Impression/Recs 1.  Falls - I am not sure why he had interval worsening of his gait and why his gait has gotten better off the tramadol.  It does not sounds like the falls are related to LOC.  He clearly has significant reasons to have gait  difficulties.  He has bilateral foot drops from previous lumbar spine surgery.  He has a known lumbar stenosis and he has severe cervical stenosis.  His position sense is very impaired which is likely a  result of his lesions at multiple levels.  He does not have any overt myelopathy on exam, so I would advocate a conservative approach with respect to his neck.  I am going to get an MRI of his L-spine to get a sense of how bad his stenosis is in his L-spine as his last imaging was done in 2004.  However, it would be difficult to advocate surgery there unless he got markedly more unstable, or he developed urinary incontinence. 2.  Memory problems - I think these are minimal and likely related to his depression.  Optimization of depression medicine may make sense in this case.  Thank you for having [place] see Scott Gomez in consultation.  Feel free to contact me with any questions.  Lupita Raider Modesto Charon, MD [facility], [place] [address] [place], [place] [address] Phone: [phone] Fax: [phone].

## 2011-06-05 ENCOUNTER — Telehealth: Payer: Self-pay | Admitting: Neurology

## 2011-06-05 ENCOUNTER — Inpatient Hospital Stay: Admission: RE | Admit: 2011-06-05 | Payer: Medicare Other | Source: Ambulatory Visit

## 2011-06-05 NOTE — Telephone Encounter (Signed)
Called and spoke with Scott Gomez at [facility]. Aware MRI now authorized and she will call the patient to re-schedule.

## 2011-06-05 NOTE — Telephone Encounter (Signed)
Called and spoke with the patient's wife. MRI scheduled for today at [facility] cancelled due to [employer] not authorizing the MRI. Morrie Sheldon with precert did call and attempt to get auth but was unable. Will reschedule once procedure is approved. She is ok with this plan.

## 2011-06-07 ENCOUNTER — Other Ambulatory Visit (HOSPITAL_COMMUNITY): Payer: Medicare Other

## 2011-06-09 ENCOUNTER — Ambulatory Visit
Admission: RE | Admit: 2011-06-09 | Discharge: 2011-06-09 | Disposition: A | Discharge status: Home / Self Care | Payer: Medicare Other | Source: Ambulatory Visit | Attending: Neurology | Admitting: Neurology

## 2011-06-09 DIAGNOSIS — W19XXXA Unspecified fall, initial encounter: Secondary | ICD-10-CM

## 2011-06-09 DIAGNOSIS — M48061 Spinal stenosis, lumbar region without neurogenic claudication: Secondary | ICD-10-CM

## 2011-06-14 ENCOUNTER — Telehealth: Payer: Self-pay | Admitting: Neurology

## 2011-06-14 NOTE — Telephone Encounter (Signed)
Spoke with the patient. Information given as per Dr. Modesto Charon re: MRI lumbar spine. No additional concerns voiced at this time.

## 2011-06-14 NOTE — Telephone Encounter (Signed)
Message copied by Benay Spice on Fri Jun 14, 2011  9:57 AM ------      Message from: Milas Gain      Created: Fri Jun 14, 2011  9:00 AM       Let Mr. Kuhner know that his lumbar MRI did show worsening stenosis from arthritis.  However, unless his walking got worse or he started developing urinary problems I would just watch it for now.

## 2011-07-04 ENCOUNTER — Ambulatory Visit: Payer: Medicare Other | Admitting: Neurology

## 2011-07-16 ENCOUNTER — Other Ambulatory Visit: Payer: Self-pay | Admitting: *Deleted

## 2011-07-16 MED ORDER — ALPRAZOLAM 0.5 MG PO TABS: 0.5000 mg | ORAL_TABLET | Freq: Two times a day (BID) | ORAL | Status: DC | PRN

## 2011-07-16 NOTE — Telephone Encounter (Signed)
Ok to refill? Last filled 03/19/11.

## 2011-07-16 NOTE — Telephone Encounter (Signed)
Px written for call in   

## 2011-07-17 NOTE — Telephone Encounter (Signed)
Called in Rx as directed.  

## 2011-07-23 ENCOUNTER — Encounter: Payer: Self-pay | Admitting: Family Medicine

## 2011-07-23 ENCOUNTER — Ambulatory Visit (INDEPENDENT_AMBULATORY_CARE_PROVIDER_SITE_OTHER): Payer: Medicare Other | Admitting: Family Medicine

## 2011-07-23 VITALS — BP 148/88 | HR 68 | Temp 98.0°F | Ht 70.0 in | Wt 237.5 lb

## 2011-07-23 DIAGNOSIS — F411 Generalized anxiety disorder: Secondary | ICD-10-CM

## 2011-07-23 DIAGNOSIS — Z79899 Other long term (current) drug therapy: Secondary | ICD-10-CM

## 2011-07-23 DIAGNOSIS — I10 Essential (primary) hypertension: Secondary | ICD-10-CM

## 2011-07-23 DIAGNOSIS — E119 Type 2 diabetes mellitus without complications: Secondary | ICD-10-CM

## 2011-07-23 DIAGNOSIS — G589 Mononeuropathy, unspecified: Secondary | ICD-10-CM

## 2011-07-23 DIAGNOSIS — G629 Polyneuropathy, unspecified: Secondary | ICD-10-CM

## 2011-07-23 DIAGNOSIS — F329 Major depressive disorder, single episode, unspecified: Secondary | ICD-10-CM

## 2011-07-23 LAB — COMPREHENSIVE METABOLIC PANEL
ALT: 17 U/L (ref 0–53)
AST: 18 U/L (ref 0–37)
Alkaline Phosphatase: 62 U/L (ref 39–117)
BUN: 17 mg/dL (ref 6–23)
Calcium: 9.6 mg/dL (ref 8.4–10.5)
Chloride: 102 mEq/L (ref 96–112)
Creatinine, Ser: 1.1 mg/dL (ref 0.4–1.5)
Potassium: 4.5 mEq/L (ref 3.5–5.1)

## 2011-07-23 LAB — CBC WITH DIFFERENTIAL/PLATELET
Basophils Absolute: 0 10*3/uL (ref 0.0–0.1)
Basophils Relative: 0.6 % (ref 0.0–3.0)
Eosinophils Absolute: 0.1 10*3/uL (ref 0.0–0.7)
Lymphocytes Relative: 22.6 % (ref 12.0–46.0)
MCHC: 33.3 g/dL (ref 30.0–36.0)
MCV: 84.6 fl (ref 78.0–100.0)
Monocytes Absolute: 0.5 10*3/uL (ref 0.1–1.0)
Neutro Abs: 3.8 10*3/uL (ref 1.4–7.7)
Neutrophils Relative %: 65.1 % (ref 43.0–77.0)
RDW: 14.5 % (ref 11.5–14.6)

## 2011-07-23 LAB — HEMOGLOBIN A1C: Hgb A1c MFr Bld: 6.5 % (ref 4.6–6.5)

## 2011-07-23 LAB — VITAMIN B12: Vitamin B-12: 194 pg/mL — ABNORMAL LOW (ref 211–911)

## 2011-07-23 LAB — SEDIMENTATION RATE: Sed Rate: 22 mm/hr (ref 0–22)

## 2011-07-23 NOTE — Assessment & Plan Note (Signed)
This was newly dx by [facility] Pt really has no symptoms or numbness/ burning (just chronic pain from spinal stenosis and oa) Labs today- will send to that clinic, for neuropathy

## 2011-07-23 NOTE — Assessment & Plan Note (Signed)
bp in fair control at this time  No changes needed  Disc lifstyle change with low sodium diet and exercise  Lab today 

## 2011-07-23 NOTE — Assessment & Plan Note (Signed)
See assessment for anxiety  Pt at this time declines counseling or continued mental health care  I do wonder if an SNRI would be more effective for mood and pain

## 2011-07-23 NOTE — Patient Instructions (Addendum)
I understand you are going through a lot right now - both at home and with your health  I strongly feel that you need some counseling for your anger problems and also for communication issues with your marriage Let me know if you want to see a counselor  We are doing some labs today for neuropathy as advised by your pain clinic I truly hope they are able to help your pain soon - and that your neck and back are responsible for much of your pain

## 2011-07-23 NOTE — Progress Notes (Signed)
Subjective:    Patient ID: Scott Gomez, male    DOB: July 31, 1934, 77 y.o.   MRN: [id number]  HPI Here for f/u of spinal stenosis causing significant neurol problems  Ataxia with foot drop Hx of falls that did improve after stopping tramadol He has mod / severe CS and LS spinal stenosis (with several back surg in past) Has appt with [facility] on 7/29  Saw Dr Modesto Charon for these problems- MRIs done Also memory issue- he feels due to mostly depression and situational stressors   [facility] was supposed to fax me something yesterday - we do not have it  He has pain all over - mostly in his arms and legs  L shoulder is from arthritis  Also spinal stenosis  They cannot give him anything for pain - that is giving him agitation and irritation - and is very angry  They tried some codeine - was not working, then tried something similar to lyrica? Now on methadone - just started it last night -- does not know if it is working yet    Has seen ortho - and knows he has arthritis in the shoulder   Did NCV all over - nerve changes - cannot pinpoint what from it is from -not from  Now they want some blood work ([facility])  Lab Results  Component Value Date   HGBA1C 6.5 05/06/2011     Hands feel tight in the am  Otherwise-not numb , just weakness  Arms and legs - getting worse over time  Difficult to raise arms above his head   Still getting by with a cane  No more falls after stopping the tramadol   Lots of cancer in the family-- kidney and one with breast   Patient Active Problem List  Diagnosis  . DIABETES MELLITUS, TYPE II  . TESTOSTERONE DEFICIENCY  . HYPERCHOLESTEROLEMIA  . ANXIETY  . CARPAL TUNNEL SYNDROME  . Essential hypertension, benign  . C O P D  . PEPTIC ULCER DISEASE  . OSTEOARTHRITIS, HANDS, BILATERAL  . SHOULDER PAIN, LEFT, CHRONIC  . OBSTRUCTIVE SLEEP APNEA  . SNORING  . PALPITATIONS, HX OF  . NEPHROLITHIASIS, HX OF  .  DEPRESSION  . Prostate cancer screening  . Ataxia  . Headache  . Falls frequently  . Memory loss  . Obesity   Past Medical History  Diagnosis Date  . Testosterone deficiency   . Snoring   . Fatigue   . Hypopotassemia   . Leg cramps   . Carpal tunnel syndrome   . Arthritis     osteoarthritis both hands  . Palpitations     Hx of   . Shoulder pain, left   . Hypercholesteremia   . Ulcer     peptic ulcer disease  . Nephrolithiasis     Hx of  . Diabetes mellitus     type II  . Anxiety    Past Surgical History  Procedure Date  . Back surgery     x2 disc   History  Substance Use Topics  . Smoking status: Former Smoker    Quit date: 01/14/1993  . Smokeless tobacco: Never Used  . Alcohol Use: No   Family History  Problem Relation Age of Onset  . Hypertension Mother   . Hypertension Father   . Cancer Sister   . Cancer Brother     kidney  . Heart disease Brother     MI   Allergies  Allergen Reactions  . Aspirin     REACTION: GI upset  . Buspirone Hcl     REACTION: itching  . Crestor (Rosuvastatin Calcium)     Increased his blood sugar   . Prednisone     REACTION: sick, per wife he can take this.   Current Outpatient Prescriptions on File Prior to Visit  Medication Sig Dispense Refill  . ALPRAZolam (XANAX) 0.5 MG tablet Take 1 tablet (0.5 mg total) by mouth 2 (two) times daily as needed for anxiety.  60 tablet  3  . bisoprolol (ZEBETA) 5 MG tablet Take 1 tablet (5 mg total) by mouth daily.  30 tablet  6  . cyclobenzaprine (FLEXERIL) 10 MG tablet Take 10 mg by mouth 3 (three) times daily as needed. For muscle spasms       . lisinopril (PRINIVIL,ZESTRIL) 5 MG tablet Take 1 tablet (5 mg total) by mouth daily.  30 tablet  11  . Misc Natural Products (GREEN TEA SLIM) TABS Take 1 tablet by mouth at bedtime.        . potassium chloride SA (K-DUR,KLOR-CON) 20 MEQ tablet Take 20 mEq by mouth daily.        . travoprost, benzalkonium, (TRAVATAN) 0.004 % ophthalmic  solution Place 1 drop into both eyes at bedtime.        Marland Kitchen PARoxetine (PAXIL) 40 MG tablet Take 1 tablet (40 mg total) by mouth every morning.  30 tablet  3  . traMADol (ULTRAM) 50 MG tablet Take 50 mg by mouth every 6 (six) hours as needed. For pain               Review of Systems Review of Systems  Constitutional: Negative for fever, appetite change,  and unexpected weight change. pos for fatigue  Eyes: Negative for pain and visual disturbance.  Respiratory: Negative for cough and shortness of breath.   Cardiovascular: Negative for cp or palpitations    Gastrointestinal: Negative for nausea, diarrhea and constipation.  Genitourinary: Negative for urgency and frequency.  Skin: Negative for pallor or rash   Neurological: Negative for weakness, light-headedness, numbness and headaches. (despite recent dx of polyneuropathy pt has no symptoms of numbness or burning) MSK pos for chronic pain in back and arms /legs (due to spinal stenosis and oa) neg for swollen or red joints Hematological: Negative for adenopathy. Does not bruise/bleed easily.  Psychiatric/Behavioral: pos for dysphoric mood and anger and anxiety        Objective:   Physical Exam  Constitutional: He appears well-developed and well-nourished. No distress.       Frail appearing elderly male   HENT:  Head: Normocephalic and atraumatic.  Eyes: Conjunctivae and EOM are normal. Pupils are equal, round, and reactive to light. No scleral icterus.  Neck: Normal range of motion. Neck supple. No JVD present. Carotid bruit is not present. No thyromegaly present.  Cardiovascular: Normal rate, regular rhythm, normal heart sounds and intact distal pulses.  Exam reveals no gallop.   Pulmonary/Chest: Effort normal and breath sounds normal. No respiratory distress.  Abdominal: Soft. Bowel sounds are normal. He exhibits no distension, no abdominal bruit and no mass. There is no tenderness.  Musculoskeletal: He exhibits tenderness. He  exhibits no edema.       Tender over CS and TS and over L shoulder entirely Limited rom L arm due to pain- cannot abduct arm over 90 degrees   Lymphadenopathy:    He has no cervical adenopathy.  Neurological: He is alert.  No cranial nerve deficit. He exhibits abnormal muscle tone. Coordination abnormal.       Baseline foot drop bilaterally- pt ambulates with a cane  No new focal deficits    Skin: Skin is warm and dry. No rash noted. No erythema. No pallor.       Ruddy complexion  Psychiatric: His speech is normal. Thought content normal. His mood appears anxious. His affect is angry. He is slowed and withdrawn. Cognition and memory are normal. He exhibits a depressed mood. He expresses no homicidal and no suicidal ideation. He expresses no suicidal plans and no homicidal plans.       Pt does make eye contact He is argumentative with his wife  Memory seems grossly intact   Is generally frustrated and angry today  Does not listen or pay attention well  Poor insight into his physical and emotional problems at this time- and poor understanding   He is inattentive.          Assessment & Plan:

## 2011-07-23 NOTE — Assessment & Plan Note (Signed)
With recent dx of neuropathy - with ncv tests at his pain management clinic a1c today Last was 6.5- well controlled with diet  No c/o of numbness or burning

## 2011-07-23 NOTE — Assessment & Plan Note (Addendum)
This and depression have worsened with stressors - more so at home/ and also involving his health According to his wife - he is in denial about this  Anger issues are escalating -though no violence Unfortunately- after an extensive conversation- pt is unwilling to tx this or see counseling  Without motivation for change- I explained there is little I can do  Disc situational / family stressors in detail today Offered counseling ref >40  min spent with face to face with patient, >50% counseling and/or coordinating care

## 2011-07-30 ENCOUNTER — Ambulatory Visit (INDEPENDENT_AMBULATORY_CARE_PROVIDER_SITE_OTHER): Payer: Medicare Other

## 2011-07-30 DIAGNOSIS — E538 Deficiency of other specified B group vitamins: Secondary | ICD-10-CM

## 2011-07-30 MED ORDER — CYANOCOBALAMIN 1000 MCG/ML IJ SOLN
1000.0000 ug | Freq: Once | INTRAMUSCULAR | Status: AC
  Administered 2011-07-30: 1000 ug via INTRAMUSCULAR

## 2011-08-06 ENCOUNTER — Encounter: Payer: Self-pay | Admitting: Family Medicine

## 2011-08-06 ENCOUNTER — Ambulatory Visit (INDEPENDENT_AMBULATORY_CARE_PROVIDER_SITE_OTHER): Payer: Medicare Other | Admitting: Family Medicine

## 2011-08-06 ENCOUNTER — Ambulatory Visit: Payer: Medicare Other

## 2011-08-06 VITALS — BP 161/88 | HR 83 | Temp 97.2°F | Ht 68.0 in | Wt 235.0 lb

## 2011-08-06 DIAGNOSIS — G629 Polyneuropathy, unspecified: Secondary | ICD-10-CM

## 2011-08-06 DIAGNOSIS — G589 Mononeuropathy, unspecified: Secondary | ICD-10-CM

## 2011-08-06 DIAGNOSIS — E538 Deficiency of other specified B group vitamins: Secondary | ICD-10-CM | POA: Insufficient documentation

## 2011-08-06 DIAGNOSIS — R5383 Other fatigue: Secondary | ICD-10-CM

## 2011-08-06 DIAGNOSIS — R531 Weakness: Secondary | ICD-10-CM | POA: Insufficient documentation

## 2011-08-06 MED ORDER — CYANOCOBALAMIN 1000 MCG/ML IJ SOLN
1000.0000 ug | Freq: Once | INTRAMUSCULAR | Status: AC
  Administered 2011-08-06: 1000 ug via INTRAMUSCULAR

## 2011-08-06 NOTE — Progress Notes (Signed)
Subjective:    Patient ID: Scott Gomez, male    DOB: [birth year], 77 y.o.   MRN: [id number]  HPI Here for f/u of neuropathy and also fatigue/ depression   Last labs had low B12 level  Adv to get 4 shots and then f/u Has had one shot so far   Neck and arms really hurt  Will return to the pain clinic after we get all his shots   He thinks he is getting weaker  Saw Dr Modesto Charon in the past - did not come up with anything  Disc role of both depression and pain and also B12 def He wants a 2nd opinon- would like ref to another neurologist  Wt is down 2 lb  Last visit we disc depression/ chronic pain and neuropathy Mood is a little better   Patient Active Problem List  Diagnosis  . DIABETES MELLITUS, TYPE II  . TESTOSTERONE DEFICIENCY  . HYPERCHOLESTEROLEMIA  . ANXIETY  . CARPAL TUNNEL SYNDROME  . Essential hypertension, benign  . C O P D  . PEPTIC ULCER DISEASE  . OSTEOARTHRITIS, HANDS, BILATERAL  . SHOULDER PAIN, LEFT, CHRONIC  . OBSTRUCTIVE SLEEP APNEA  . SNORING  . PALPITATIONS, HX OF  . NEPHROLITHIASIS, HX OF  . DEPRESSION  . Prostate cancer screening  . Ataxia  . Headache  . Falls frequently  . Memory loss  . Obesity  . Neuropathy  . Vitamin B 12 deficiency  . Weakness generalized   Past Medical History  Diagnosis Date  . Testosterone deficiency   . Snoring   . Fatigue   . Hypopotassemia   . Leg cramps   . Carpal tunnel syndrome   . Arthritis     osteoarthritis both hands  . Palpitations     Hx of   . Shoulder pain, left   . Hypercholesteremia   . Ulcer     peptic ulcer disease  . Nephrolithiasis     Hx of  . Diabetes mellitus     type II  . Anxiety    Past Surgical History  Procedure Date  . Back surgery     x2 disc   History  Substance Use Topics  . Smoking status: Former Smoker    Quit date: 01/14/1993  . Smokeless tobacco: Never Used  . Alcohol Use: No   Family History  Problem Relation Age of Onset  . Hypertension Mother   .  Hypertension Father   . Cancer Sister   . Cancer Brother     kidney  . Heart disease Brother     MI   Allergies  Allergen Reactions  . Aspirin     REACTION: GI upset  . Buspirone Hcl     REACTION: itching  . Crestor (Rosuvastatin Calcium)     Increased his blood sugar   . Prednisone     REACTION: sick, per wife he can take this.   Current Outpatient Prescriptions on File Prior to Visit  Medication Sig Dispense Refill  . ALPRAZolam (XANAX) 0.5 MG tablet Take 1 tablet (0.5 mg total) by mouth 2 (two) times daily as needed for anxiety.  60 tablet  3  . bisoprolol (ZEBETA) 5 MG tablet Take 1 tablet (5 mg total) by mouth daily.  30 tablet  6  . cyclobenzaprine (FLEXERIL) 10 MG tablet Take 10 mg by mouth 3 (three) times daily as needed. For muscle spasms       . lisinopril (PRINIVIL,ZESTRIL) 5 MG tablet Take 1 tablet (  5 mg total) by mouth daily.  30 tablet  11  . Misc Natural Products (GREEN TEA SLIM) TABS Take 1 tablet by mouth at bedtime.        Marland Kitchen PARoxetine (PAXIL) 40 MG tablet Take 1 tablet (40 mg total) by mouth every morning.  30 tablet  3  . potassium chloride SA (K-DUR,KLOR-CON) 20 MEQ tablet Take 20 mEq by mouth daily.        . travoprost, benzalkonium, (TRAVATAN) 0.004 % ophthalmic solution Place 1 drop into both eyes at bedtime.        . traMADol (ULTRAM) 50 MG tablet Take 50 mg by mouth every 6 (six) hours as needed. For pain          Review of Systems Review of Systems  Constitutional: Negative for fever, appetite change,  and unexpected weight change. pos for generalized fatigue and weakness Eyes: Negative for pain and visual disturbance.  Respiratory: Negative for cough and shortness of breath.   Cardiovascular: Negative for cp or palpitations    Gastrointestinal: Negative for nausea, diarrhea and constipation.  Genitourinary: Negative for urgency and frequency.  Skin: Negative for pallor or rash   Neurological: Negative for weakness, light-headedness, numbness and  headaches.  Hematological: Negative for adenopathy. Does not bruise/bleed easily.  Psychiatric/Behavioral: pos for anxiety and depression with irritability- per pt all improved        Objective:   Physical Exam  Constitutional: He appears well-developed and well-nourished. No distress.  HENT:  Head: Normocephalic and atraumatic.  Right Ear: External ear normal.  Nose: Nose normal.  Mouth/Throat: Oropharynx is clear and moist. No oropharyngeal exudate.  Eyes: Conjunctivae and EOM are normal. Pupils are equal, round, and reactive to light. No scleral icterus.  Neck: Normal range of motion. Neck supple. No JVD present. Carotid bruit is not present. No thyromegaly present.  Cardiovascular: Normal rate, regular rhythm, normal heart sounds and intact distal pulses.   Pulmonary/Chest: Effort normal and breath sounds normal. No respiratory distress. He has no wheezes.  Abdominal: Soft. Bowel sounds are normal. He exhibits no abdominal bruit.  Musculoskeletal: He exhibits tenderness. He exhibits no edema.       Poor rom bilat UEs and neck  Grip intact Diffusely tender over neck/ shoulder girdle   Lymphadenopathy:    He has no cervical adenopathy.  Neurological: He is alert. He exhibits normal muscle tone.  Skin: Skin is warm and dry. No rash noted.  Psychiatric: His speech is normal and behavior is normal. His mood appears anxious. His affect is not labile. He exhibits a depressed mood.       Overall less irritable than last time ,a bit less depressed seemingly          Assessment & Plan:

## 2011-08-06 NOTE — Patient Instructions (Addendum)
B12 shot today- stay on schedule with those  We will do neurology referral at check out  Keep me updated about your symptoms

## 2011-08-11 NOTE — Assessment & Plan Note (Signed)
With depression/ anxiety/ B12 def/ chronic pain  Will ref for 2nd opinion neuro at his request  Still declines addnl tx for anx/dep

## 2011-08-11 NOTE — Assessment & Plan Note (Signed)
Pt will continue B12 shots - 4 in 4 weeks and re assess Hopeful this will help some of his fatigue and neurologic symptoms

## 2011-08-11 NOTE — Assessment & Plan Note (Signed)
Will treat B12 def  Also ref for neurol 2nd opinion He does not complain so much of numbness as fatigue and weakness however

## 2011-08-13 ENCOUNTER — Ambulatory Visit (INDEPENDENT_AMBULATORY_CARE_PROVIDER_SITE_OTHER): Payer: Medicare Other

## 2011-08-13 DIAGNOSIS — E538 Deficiency of other specified B group vitamins: Secondary | ICD-10-CM

## 2011-08-13 MED ORDER — CYANOCOBALAMIN 1000 MCG/ML IJ SOLN
1000.0000 ug | Freq: Once | INTRAMUSCULAR | Status: AC
  Administered 2011-08-13: 1000 ug via INTRAMUSCULAR

## 2011-08-20 ENCOUNTER — Ambulatory Visit (INDEPENDENT_AMBULATORY_CARE_PROVIDER_SITE_OTHER): Payer: Medicare Other | Admitting: *Deleted

## 2011-08-20 DIAGNOSIS — E538 Deficiency of other specified B group vitamins: Secondary | ICD-10-CM

## 2011-08-20 MED ORDER — CYANOCOBALAMIN 1000 MCG/ML IJ SOLN
1000.0000 ug | Freq: Once | INTRAMUSCULAR | Status: AC
  Administered 2011-08-20: 1000 ug via INTRAMUSCULAR

## 2011-08-27 ENCOUNTER — Encounter: Payer: Self-pay | Admitting: Family Medicine

## 2011-08-27 ENCOUNTER — Ambulatory Visit (INDEPENDENT_AMBULATORY_CARE_PROVIDER_SITE_OTHER): Payer: Medicare Other | Admitting: Family Medicine

## 2011-08-27 VITALS — BP 149/88 | HR 83 | Temp 98.1°F | Ht 68.0 in | Wt 220.0 lb

## 2011-08-27 DIAGNOSIS — R296 Repeated falls: Secondary | ICD-10-CM

## 2011-08-27 DIAGNOSIS — F411 Generalized anxiety disorder: Secondary | ICD-10-CM

## 2011-08-27 DIAGNOSIS — Z9181 History of falling: Secondary | ICD-10-CM

## 2011-08-27 DIAGNOSIS — M4800 Spinal stenosis, site unspecified: Secondary | ICD-10-CM

## 2011-08-27 DIAGNOSIS — E538 Deficiency of other specified B group vitamins: Secondary | ICD-10-CM

## 2011-08-27 MED ORDER — PAROXETINE HCL 40 MG PO TABS: 40.0000 mg | ORAL_TABLET | ORAL | Status: DC

## 2011-08-27 NOTE — Progress Notes (Signed)
Subjective:    Patient ID: Scott Gomez, male    DOB: 08-17-1934, 77 y.o.   MRN: [id number]  HPI Here for f/u of B12 deficiency with hx of neuropathy Labs on 7/9 showed B 12 level of 194 Has since had 4 B12 shots in 4 weeks with plan to re check level today  Has been feeling better in general -more energy in general  Mood is also better   Since last visit saw a 2nd neurologist for 2nd opinion for his gait disorder Told that his lumbar stenosis was the cause Given AFO- will be going to get those at [facility] [place] F/u as needed This was explained to him well- and that helped to reassure him   No falls since last visit   Wt is down 15 lb from last visit  Less appetite due to pain  Ran out of pain pills - for a while - has pain clinic appt soon   Still on paxil Needs refil to [facility] This seems to have helped a lot Pt states his mood is better  Also his wife agrees Even smiling today  Much less irritable and down  Patient Active Problem List  Diagnosis  . DIABETES MELLITUS, TYPE II  . TESTOSTERONE DEFICIENCY  . HYPERCHOLESTEROLEMIA  . ANXIETY  . CARPAL TUNNEL SYNDROME  . Essential hypertension, benign  . C O P D  . PEPTIC ULCER DISEASE  . OSTEOARTHRITIS, HANDS, BILATERAL  . SHOULDER PAIN, LEFT, CHRONIC  . OBSTRUCTIVE SLEEP APNEA  . SNORING  . PALPITATIONS, HX OF  . NEPHROLITHIASIS, HX OF  . DEPRESSION  . Prostate cancer screening  . Ataxia  . Headache  . Falls frequently  . Memory loss  . Obesity  . Neuropathy  . Vitamin B 12 deficiency  . Weakness generalized   Past Medical History  Diagnosis Date  . Testosterone deficiency   . Snoring   . Fatigue   . Hypopotassemia   . Leg cramps   . Carpal tunnel syndrome   . Arthritis     osteoarthritis both hands  . Palpitations     Hx of   . Shoulder pain, left   . Hypercholesteremia   . Ulcer     peptic ulcer disease  . Nephrolithiasis     Hx of  . Diabetes mellitus     type II  .  Anxiety    Past Surgical History  Procedure Date  . Back surgery     x2 disc   History  Substance Use Topics  . Smoking status: Former Smoker    Quit date: 01/14/1993  . Smokeless tobacco: Never Used  . Alcohol Use: No   Family History  Problem Relation Age of Onset  . Hypertension Mother   . Hypertension Father   . Cancer Sister   . Cancer Brother     kidney  . Heart disease Brother     MI   Allergies  Allergen Reactions  . Aspirin     REACTION: GI upset  . Buspirone Hcl     REACTION: itching  . Crestor (Rosuvastatin Calcium)     Increased his blood sugar   . Prednisone     REACTION: sick, per wife he can take this.   Current Outpatient Prescriptions on File Prior to Visit  Medication Sig Dispense Refill  . ALPRAZolam (XANAX) 0.5 MG tablet Take 1 tablet (0.5 mg total) by mouth 2 (two) times daily as needed for anxiety.  60  tablet  3  . bisoprolol (ZEBETA) 5 MG tablet Take 1 tablet (5 mg total) by mouth daily.  30 tablet  6  . cyclobenzaprine (FLEXERIL) 10 MG tablet Take 10 mg by mouth 3 (three) times daily as needed. For muscle spasms       . lisinopril (PRINIVIL,ZESTRIL) 5 MG tablet Take 1 tablet (5 mg total) by mouth daily.  30 tablet  11  . Misc Natural Products (GREEN TEA SLIM) TABS Take 1 tablet by mouth at bedtime.        Marland Kitchen PARoxetine (PAXIL) 40 MG tablet Take 1 tablet (40 mg total) by mouth every morning.  30 tablet  3  . potassium chloride SA (K-DUR,KLOR-CON) 20 MEQ tablet Take 20 mEq by mouth daily.        . travoprost, benzalkonium, (TRAVATAN) 0.004 % ophthalmic solution Place 1 drop into both eyes at bedtime.        . traMADol (ULTRAM) 50 MG tablet Take 50 mg by mouth every 6 (six) hours as needed. For pain            Review of Systems Review of Systems  Constitutional: Negative for fever, appetite change, fatigue and unexpected weight change.  Eyes: Negative for pain and visual disturbance.  Respiratory: Negative for cough and shortness of breath.     Cardiovascular: Negative for cp or palpitations    Gastrointestinal: Negative for nausea, diarrhea and constipation.  Genitourinary: Negative for urgency and frequency.  Skin: Negative for pallor or rash   MSK pos for chronic pain in legs/ back and shoulder , neg for joint swelling Neurological: Negative for, light-headedness, numbness and headaches. pos for baseline weakness of ankles with trouble walking due to spinal stenosis Hematological: Negative for adenopathy. Does not bruise/bleed easily.  Psychiatric/Behavioral: Negative for dysphoric mood. The patients anxious but this is greatly improved       Objective:   Physical Exam  Constitutional: He appears well-developed and well-nourished. No distress.       obese and well appearing   HENT:  Head: Normocephalic and atraumatic.  Mouth/Throat: Oropharynx is clear and moist.  Eyes: Conjunctivae and EOM are normal. Pupils are equal, round, and reactive to light. No scleral icterus.  Neck: Normal range of motion. Neck supple. No tracheal deviation present. No thyromegaly present.  Cardiovascular: Normal rate, regular rhythm and normal heart sounds.   Pulmonary/Chest: Effort normal and breath sounds normal. No respiratory distress. He has no wheezes.  Abdominal: Soft. Bowel sounds are normal. He exhibits no distension. There is no tenderness.  Musculoskeletal: He exhibits edema and tenderness.       Mild ankle edema Poor muscle tone in ankles - unable to dorsiflex well and drags feet  Tender LS  Poor rom L shoulder due to pain   Lymphadenopathy:    He has no cervical adenopathy.  Neurological: He is alert. He has normal reflexes. He displays no atrophy and no tremor. A sensory deficit is present. He exhibits abnormal muscle tone. Coordination and gait normal.       Abn gait due to foot drop  Skin: Skin is warm and dry. No rash noted. No erythema. No pallor.  Psychiatric: He has a normal mood and affect.          Assessment &  Plan:

## 2011-08-27 NOTE — Assessment & Plan Note (Signed)
Improved with paxil Generally better mood Will continue this- px refilled

## 2011-08-27 NOTE — Assessment & Plan Note (Signed)
With bilat foot drop-inoperable Has had neuro eval and also goes to pain clinic  Will get AFOs soon to help gait For pain clinic f/u soon

## 2011-08-27 NOTE — Patient Instructions (Addendum)
Checking B12 level today to see what we need to do with that  I'm glad you are feeling better  Go ahead and get the braces for your legs/ ankles I refilled your paxil

## 2011-08-27 NOTE — Assessment & Plan Note (Signed)
Improved- no falls lately 2 neuro w/u- decided that this and neuropathy are from his spinal stenosis  Will be getting AFOs to help with foot drop- hope will be helpful Declines walker- will continue a cane

## 2011-08-27 NOTE — Assessment & Plan Note (Signed)
Level today after 4 inj Is feeling better Will decide on dosing schedule when result arrives

## 2011-08-28 LAB — VITAMIN B12: Vitamin B-12: 620 pg/mL (ref 211–911)

## 2011-09-11 ENCOUNTER — Other Ambulatory Visit: Payer: Self-pay | Admitting: *Deleted

## 2011-09-11 MED ORDER — GLUCOSE BLOOD VI STRP: ORAL_STRIP | Status: DC

## 2011-09-17 ENCOUNTER — Ambulatory Visit: Payer: Medicare Other

## 2011-09-19 ENCOUNTER — Ambulatory Visit: Payer: Medicare Other

## 2011-09-24 ENCOUNTER — Ambulatory Visit: Payer: Medicare Other

## 2011-09-30 ENCOUNTER — Institutional Professional Consult (permissible substitution): Payer: Medicare Other | Admitting: Pulmonary Disease

## 2011-11-04 ENCOUNTER — Telehealth: Payer: Self-pay | Admitting: Family Medicine

## 2011-11-04 DIAGNOSIS — K279 Peptic ulcer, site unspecified, unspecified as acute or chronic, without hemorrhage or perforation: Secondary | ICD-10-CM

## 2011-11-04 DIAGNOSIS — Z125 Encounter for screening for malignant neoplasm of prostate: Secondary | ICD-10-CM

## 2011-11-04 DIAGNOSIS — E7800 Pure hypercholesterolemia, unspecified: Secondary | ICD-10-CM

## 2011-11-04 DIAGNOSIS — E119 Type 2 diabetes mellitus without complications: Secondary | ICD-10-CM

## 2011-11-04 DIAGNOSIS — I10 Essential (primary) hypertension: Secondary | ICD-10-CM

## 2011-11-04 DIAGNOSIS — E538 Deficiency of other specified B group vitamins: Secondary | ICD-10-CM

## 2011-11-04 NOTE — Telephone Encounter (Signed)
Message copied by Judy Pimple on Mon Nov 04, 2011  8:00 AM ------      Message from: Alvina Chou      Created: Fri Nov 01, 2011  4:28 PM      Regarding: Lab orders for Tuesday, 10.22.13       Patient is scheduled for CPX labs, please order future labs, Thanks , Camelia Eng

## 2011-11-05 ENCOUNTER — Other Ambulatory Visit: Payer: Self-pay | Admitting: *Deleted

## 2011-11-05 ENCOUNTER — Other Ambulatory Visit (INDEPENDENT_AMBULATORY_CARE_PROVIDER_SITE_OTHER): Payer: Medicare Other

## 2011-11-05 DIAGNOSIS — E119 Type 2 diabetes mellitus without complications: Secondary | ICD-10-CM

## 2011-11-05 DIAGNOSIS — E7800 Pure hypercholesterolemia, unspecified: Secondary | ICD-10-CM

## 2011-11-05 DIAGNOSIS — I10 Essential (primary) hypertension: Secondary | ICD-10-CM

## 2011-11-05 DIAGNOSIS — K279 Peptic ulcer, site unspecified, unspecified as acute or chronic, without hemorrhage or perforation: Secondary | ICD-10-CM

## 2011-11-05 DIAGNOSIS — E538 Deficiency of other specified B group vitamins: Secondary | ICD-10-CM

## 2011-11-05 DIAGNOSIS — Z125 Encounter for screening for malignant neoplasm of prostate: Secondary | ICD-10-CM

## 2011-11-05 LAB — COMPREHENSIVE METABOLIC PANEL
ALT: 14 U/L (ref 0–53)
AST: 15 U/L (ref 0–37)
Albumin: 3.7 g/dL (ref 3.5–5.2)
Alkaline Phosphatase: 59 U/L (ref 39–117)
BUN: 16 mg/dL (ref 6–23)
CO2: 30 mEq/L (ref 19–32)
Calcium: 9.2 mg/dL (ref 8.4–10.5)
Chloride: 105 mEq/L (ref 96–112)
Creatinine, Ser: 0.9 mg/dL (ref 0.4–1.5)
GFR: 83.03 mL/min (ref >60.00)
Glucose, Bld: 102 mg/dL — ABNORMAL HIGH (ref 70–99)
Potassium: 4 mEq/L (ref 3.5–5.1)
Sodium: 140 mEq/L (ref 135–145)
Total Bilirubin: 0.4 mg/dL (ref 0.3–1.2)
Total Protein: 6.4 g/dL (ref 6.0–8.3)

## 2011-11-05 LAB — CBC WITH DIFFERENTIAL/PLATELET
Basophils Absolute: 0 10*3/uL (ref 0.0–0.1)
Basophils Relative: 0.9 % (ref 0.0–3.0)
Eosinophils Absolute: 0.1 10*3/uL (ref 0.0–0.7)
Eosinophils Relative: 2.1 % (ref 0.0–5.0)
HCT: 38.1 % — ABNORMAL LOW (ref 39.0–52.0)
Hemoglobin: 12.7 g/dL — ABNORMAL LOW (ref 13.0–17.0)
Lymphocytes Relative: 33.4 % (ref 12.0–46.0)
Lymphs Abs: 1.5 10*3/uL (ref 0.7–4.0)
MCHC: 33.3 g/dL (ref 30.0–36.0)
MCV: 85.6 fl (ref 78.0–100.0)
Monocytes Absolute: 0.5 10*3/uL (ref 0.1–1.0)
Monocytes Relative: 10.8 % (ref 3.0–12.0)
Neutro Abs: 2.3 10*3/uL (ref 1.4–7.7)
Neutrophils Relative %: 52.8 % (ref 43.0–77.0)
Platelets: 151 10*3/uL (ref 150.0–400.0)
RBC: 4.46 Mil/uL (ref 4.22–5.81)
RDW: 16 % — ABNORMAL HIGH (ref 11.5–14.6)
WBC: 4.4 10*3/uL — ABNORMAL LOW (ref 4.5–10.5)

## 2011-11-05 LAB — LIPID PANEL: HDL: 39.3 mg/dL (ref >39.00)

## 2011-11-05 LAB — HEMOGLOBIN A1C: Hgb A1c MFr Bld: 5.9 % (ref 4.6–6.5)

## 2011-11-05 MED ORDER — ALPRAZOLAM 0.5 MG PO TABS: 0.5000 mg | ORAL_TABLET | Freq: Two times a day (BID) | ORAL | Status: DC | PRN

## 2011-11-05 NOTE — Telephone Encounter (Signed)
Px written for call in   

## 2011-11-05 NOTE — Telephone Encounter (Signed)
Rx called in as prescribed 

## 2011-11-08 LAB — PSA: PSA: 0.95 ng/mL (ref 0.10–4.00)

## 2011-11-08 LAB — TSH: TSH: 2.6 u[IU]/mL (ref 0.35–5.50)

## 2011-11-08 LAB — VITAMIN B12: Vitamin B-12: 316 pg/mL (ref 211–911)

## 2011-11-11 ENCOUNTER — Other Ambulatory Visit: Payer: Self-pay | Admitting: *Deleted

## 2011-11-11 MED ORDER — BISOPROLOL FUMARATE 5 MG PO TABS: 5.0000 mg | ORAL_TABLET | Freq: Every day | ORAL | Status: DC

## 2011-11-12 ENCOUNTER — Ambulatory Visit (INDEPENDENT_AMBULATORY_CARE_PROVIDER_SITE_OTHER): Payer: Medicare Other | Admitting: Family Medicine

## 2011-11-12 ENCOUNTER — Encounter: Payer: Self-pay | Admitting: Family Medicine

## 2011-11-12 VITALS — BP 138/86 | HR 62 | Temp 98.3°F | Ht 68.0 in | Wt 230.2 lb

## 2011-11-12 DIAGNOSIS — Z9181 History of falling: Secondary | ICD-10-CM

## 2011-11-12 DIAGNOSIS — R739 Hyperglycemia, unspecified: Secondary | ICD-10-CM

## 2011-11-12 DIAGNOSIS — I10 Essential (primary) hypertension: Secondary | ICD-10-CM

## 2011-11-12 DIAGNOSIS — E7800 Pure hypercholesterolemia, unspecified: Secondary | ICD-10-CM

## 2011-11-12 DIAGNOSIS — Z23 Encounter for immunization: Secondary | ICD-10-CM

## 2011-11-12 DIAGNOSIS — R296 Repeated falls: Secondary | ICD-10-CM

## 2011-11-12 DIAGNOSIS — Z125 Encounter for screening for malignant neoplasm of prostate: Secondary | ICD-10-CM

## 2011-11-12 DIAGNOSIS — R7309 Other abnormal glucose: Secondary | ICD-10-CM

## 2011-11-12 DIAGNOSIS — E538 Deficiency of other specified B group vitamins: Secondary | ICD-10-CM

## 2011-11-12 NOTE — Assessment & Plan Note (Signed)
This is improved with walking and better diet Per pt - also wt loss (that did not show on our scales)

## 2011-11-12 NOTE — Assessment & Plan Note (Signed)
Lipids are down some  Disc goals for lipids and reasons to control them Rev labs with pt Rev low sat fat diet in detail  Intol of statin in past

## 2011-11-12 NOTE — Progress Notes (Signed)
Subjective:    Patient ID: Scott Gomez, male    DOB: [birth year], 77 y.o.   MRN: [id number]  HPI Here for check up of chronic medical conditions and to review health mt list    Wt is up 10 lb with bmi of 35 Is wearing his braces on his legs- they weigh a lot and weighs 215 at home without them Has been walking - goes to the [facility]- and enjoys it  Does feel like he is getting a little stronger The braces (AFO) for legs were difficult to get --but really helpful   Lipid Lab Results  Component Value Date   CHOL 200 11/05/2011   CHOL 184 05/06/2011   CHOL 148 01/02/2011   Lab Results  Component Value Date   HDL 39.30 11/05/2011   HDL 35.10* 05/06/2011   HDL 31* 01/02/2011   Lab Results  Component Value Date   LDLCALC 113* 05/06/2011   LDLCALC 95 01/02/2011   LDLCALC 45 05/11/2010   Lab Results  Component Value Date   TRIG 244.0* 11/05/2011   TRIG 181.0* 05/06/2011   TRIG 111 01/02/2011   Lab Results  Component Value Date   CHOLHDL 5 11/05/2011   CHOLHDL 5 05/06/2011   CHOLHDL 4.8 01/02/2011   Lab Results  Component Value Date   LDLDIRECT 129.3 11/05/2011   LDLDIRECT 152.3 11/07/2010   LDLDIRECT 148.3 08/23/2010    Chol is down  Is also eating healthier Pants went down 2 sizes Eats a lot less also   Hx of B12 def Lab Results  Component Value Date   VITAMINB12 316 11/05/2011    Hx of hyperglycemia Lab Results  Component Value Date   HGBA1C 5.9 11/05/2011   improved-- diet/ no sugar at all and cutting carbs and also portions   Prostate cancer screening  Lab Results  Component Value Date   PSA 0.95 11/05/2011   PSA 0.69 11/07/2010   PSA 0.76 01/14/2007   also has testosterone def No prostate symptoms at all  Nocturia usually none Stream is normal  Does not want prostate exam today unless necessary   Zoster status May be interested in vaccine   Flu vaccine Needs that today   Colonoscopy 2/00 Does not want a colonoscopy  Not interested in stool  card  He would not treat if he had   Patient Active Problem List  Diagnosis  . Hyperglycemia  . TESTOSTERONE DEFICIENCY  . HYPERCHOLESTEROLEMIA  . ANXIETY  . CARPAL TUNNEL SYNDROME  . Essential hypertension, benign  . C O P D  . PEPTIC ULCER DISEASE  . OSTEOARTHRITIS, HANDS, BILATERAL  . SHOULDER PAIN, LEFT, CHRONIC  . OBSTRUCTIVE SLEEP APNEA  . SNORING  . PALPITATIONS, HX OF  . NEPHROLITHIASIS, HX OF  . DEPRESSION  . Prostate cancer screening  . Headache  . Falls frequently  . Memory loss  . Obesity  . Neuropathy  . Vitamin B 12 deficiency  . Weakness generalized  . Spinal stenosis   Past Medical History  Diagnosis Date  . Testosterone deficiency   . Snoring   . Fatigue   . Hypopotassemia   . Leg cramps   . Carpal tunnel syndrome   . Arthritis     osteoarthritis both hands  . Palpitations     Hx of   . Shoulder pain, left   . Hypercholesteremia   . Ulcer     peptic ulcer disease  . Nephrolithiasis     Hx of  . Diabetes  mellitus     type II  . Anxiety    Past Surgical History  Procedure Date  . Back surgery     x2 disc   History  Substance Use Topics  . Smoking status: Former Smoker    Quit date: 01/14/1993  . Smokeless tobacco: Never Used  . Alcohol Use: No   Family History  Problem Relation Age of Onset  . Hypertension Mother   . Hypertension Father   . Cancer Sister   . Cancer Brother     kidney  . Heart disease Brother     MI   Allergies  Allergen Reactions  . Aspirin     REACTION: GI upset  . Buspirone Hcl     REACTION: itching  . Crestor (Rosuvastatin Calcium)     Increased his blood sugar   . Prednisone     REACTION: sick, per wife he can take this.   Current Outpatient Prescriptions on File Prior to Visit  Medication Sig Dispense Refill  . ALPRAZolam (XANAX) 0.5 MG tablet Take 1 tablet (0.5 mg total) by mouth 2 (two) times daily as needed for anxiety.  60 tablet  3  . bisoprolol (ZEBETA) 5 MG tablet Take 1 tablet (5 mg  total) by mouth daily.  30 tablet  6  . cyclobenzaprine (FLEXERIL) 10 MG tablet Take 10 mg by mouth 3 (three) times daily as needed. For muscle spasms       . glucose blood test strip Bayer Contour-Test once daily and as needed.  100 each  3  . lisinopril (PRINIVIL,ZESTRIL) 5 MG tablet Take 1 tablet (5 mg total) by mouth daily.  30 tablet  11  . Misc Natural Products (GREEN TEA SLIM) TABS Take 1 tablet by mouth at bedtime.        Marland Kitchen PARoxetine (PAXIL) 40 MG tablet Take 1 tablet (40 mg total) by mouth every morning.  30 tablet  11  . potassium chloride SA (K-DUR,KLOR-CON) 20 MEQ tablet Take 20 mEq by mouth daily.        . travoprost, benzalkonium, (TRAVATAN) 0.004 % ophthalmic solution Place 1 drop into both eyes at bedtime.            Review of Systems Review of Systems  Constitutional: Negative for fever, appetite change,  and unexpected weight change. pos for improved fatigue Eyes: Negative for pain and visual disturbance.  Respiratory: Negative for cough and shortness of breath.   Cardiovascular: Negative for cp or palpitations    Gastrointestinal: Negative for nausea, diarrhea and constipation.  Genitourinary: Negative for urgency and frequency.  Skin: Negative for pallor or rash   MSK pos for improved back pain  Neurological: Negative for  light-headedness, numbness and headaches. pos for chronic leg weakness from spinal stenosis which he compensates for well with leg braces/ AFOs Hematological: Negative for adenopathy. Does not bruise/bleed easily.  Psychiatric/Behavioral: Negative for dysphoric mood. The patient is not nervous/anxious.  (mood is much improved)       Objective:   Physical Exam  Constitutional: He appears well-developed and well-nourished. No distress.  HENT:  Head: Normocephalic and atraumatic.  Right Ear: External ear normal.  Left Ear: External ear normal.  Nose: Nose normal.  Mouth/Throat: Oropharynx is clear and moist.  Eyes: Conjunctivae normal and EOM  are normal. Pupils are equal, round, and reactive to light. Right eye exhibits no discharge. Left eye exhibits no discharge. No scleral icterus.  Neck: Normal range of motion. Neck supple. No JVD present.  Carotid bruit is not present. No thyromegaly present.  Cardiovascular: Normal rate, regular rhythm, normal heart sounds and intact distal pulses.  Exam reveals no gallop.   Pulmonary/Chest: Effort normal and breath sounds normal. No respiratory distress. He has no wheezes.  Abdominal: Soft. Bowel sounds are normal. He exhibits no distension, no abdominal bruit and no mass. There is no tenderness.  Musculoskeletal: Normal range of motion. He exhibits no edema and no tenderness.       Wearing AFO lower leg braces and gait is now steady and confident No joint swelling or tenderness  Has sock lines/ but no pitting edema   Lymphadenopathy:    He has no cervical adenopathy.  Neurological: He is alert. He has normal reflexes. No cranial nerve deficit. He exhibits normal muscle tone. Coordination normal.  Skin: Skin is warm and dry. No rash noted. No erythema. No pallor.  Psychiatric: He has a normal mood and affect.          Assessment & Plan:

## 2011-11-12 NOTE — Assessment & Plan Note (Signed)
B12 level currently tx on current supplementation and diet

## 2011-11-12 NOTE — Assessment & Plan Note (Signed)
bp in fair control at this time  No changes needed  Disc lifstyle change with low sodium diet and exercise  Rev labs with pt today 

## 2011-11-12 NOTE — Assessment & Plan Note (Signed)
psa stable  No symptoms Pt declined exam after disc pros and cons

## 2011-11-12 NOTE — Assessment & Plan Note (Signed)
Much improved with tx of spinal stenosis and AFOs  No further falls  Risk greatly decreased  Also better balance and strength now

## 2011-11-12 NOTE — Patient Instructions (Addendum)
Keep walking and watching your diet If you are interested in a shingles/zoster vaccine - call your insurance to check on coverage,( you should not get it within 1 month of other vaccines) , then call us for a prescription  for it to take to a pharmacy that gives the shot , or make a nurse visit to get it here depending on your coverage Flu vaccine today

## 2011-11-27 ENCOUNTER — Ambulatory Visit: Payer: Medicare Other | Admitting: Family Medicine

## 2011-12-16 ENCOUNTER — Other Ambulatory Visit: Payer: Self-pay | Admitting: *Deleted

## 2011-12-16 MED ORDER — POTASSIUM CHLORIDE CRYS ER 20 MEQ PO TBCR: 20.0000 meq | EXTENDED_RELEASE_TABLET | Freq: Every day | ORAL | Status: DC

## 2012-01-21 ENCOUNTER — Telehealth: Payer: Self-pay | Admitting: Family Medicine

## 2012-01-21 MED ORDER — OSELTAMIVIR PHOSPHATE 75 MG PO CAPS: 75.0000 mg | ORAL_CAPSULE | Freq: Every day | ORAL | Status: DC

## 2012-01-21 NOTE — Telephone Encounter (Signed)
Wife diagnosed with flu today at [facility].  Sent in flu prophylaxis for her husband. Tamiflux daily x 10 days.

## 2012-02-21 ENCOUNTER — Other Ambulatory Visit: Payer: Self-pay | Admitting: *Deleted

## 2012-02-21 MED ORDER — ALPRAZOLAM 0.5 MG PO TABS: 0.5000 mg | ORAL_TABLET | Freq: Two times a day (BID) | ORAL | Status: DC | PRN

## 2012-02-21 NOTE — Telephone Encounter (Signed)
Px written for call in   

## 2012-02-21 NOTE — Telephone Encounter (Signed)
Rx called in as prescribed 

## 2012-02-21 NOTE — Telephone Encounter (Signed)
Last refill filled 01/24/12

## 2012-03-24 ENCOUNTER — Other Ambulatory Visit: Payer: Self-pay | Admitting: *Deleted

## 2012-03-24 MED ORDER — LISINOPRIL 5 MG PO TABS: 5.0000 mg | ORAL_TABLET | Freq: Every day | ORAL | Status: DC

## 2012-06-12 ENCOUNTER — Other Ambulatory Visit: Payer: Self-pay | Admitting: *Deleted

## 2012-06-12 MED ORDER — ALPRAZOLAM 0.5 MG PO TABS: 0.5000 mg | ORAL_TABLET | Freq: Two times a day (BID) | ORAL | Status: DC | PRN

## 2012-06-12 MED ORDER — POTASSIUM CHLORIDE CRYS ER 20 MEQ PO TBCR: 20.0000 meq | EXTENDED_RELEASE_TABLET | Freq: Every day | ORAL | Status: DC

## 2012-06-12 MED ORDER — BISOPROLOL FUMARATE 5 MG PO TABS: 5.0000 mg | ORAL_TABLET | Freq: Every day | ORAL | Status: DC

## 2012-06-12 NOTE — Telephone Encounter (Signed)
Last filled 05/13/12

## 2012-06-12 NOTE — Telephone Encounter (Signed)
Okay #[phone] x 0 

## 2012-06-12 NOTE — Telephone Encounter (Signed)
Done, Rx called in as prescribed 

## 2012-07-09 ENCOUNTER — Other Ambulatory Visit: Payer: Self-pay | Admitting: *Deleted

## 2012-07-09 MED ORDER — ALPRAZOLAM 0.5 MG PO TABS: 0.5000 mg | ORAL_TABLET | Freq: Two times a day (BID) | ORAL | Status: DC | PRN

## 2012-07-09 NOTE — Telephone Encounter (Signed)
Electronic refill request, Dr. Milinda Antis out of office

## 2012-07-09 NOTE — Telephone Encounter (Signed)
Rx called in as prescribed 

## 2012-07-09 NOTE — Telephone Encounter (Signed)
Okay #[phone] x 0 

## 2012-08-10 ENCOUNTER — Other Ambulatory Visit: Payer: Self-pay | Admitting: *Deleted

## 2012-08-10 MED ORDER — ALPRAZOLAM 0.5 MG PO TABS: 0.5000 mg | ORAL_TABLET | Freq: Two times a day (BID) | ORAL | Status: DC | PRN

## 2012-08-10 NOTE — Telephone Encounter (Signed)
Last filled 07/09/12 

## 2012-08-10 NOTE — Telephone Encounter (Signed)
Px written for call in   Please schedule PE for November-thanks

## 2012-08-10 NOTE — Telephone Encounter (Signed)
CPE scheduled and Rx called in as prescribed

## 2012-09-23 ENCOUNTER — Other Ambulatory Visit: Payer: Self-pay | Admitting: Family Medicine

## 2012-09-23 NOTE — Telephone Encounter (Signed)
Ok to refill 

## 2012-09-23 NOTE — Telephone Encounter (Signed)
Please refill for 3 months - he has upcoming f/u with me

## 2012-09-24 MED ORDER — PAROXETINE HCL 40 MG PO TABS: 40.0000 mg | ORAL_TABLET | ORAL | Status: DC

## 2012-10-06 ENCOUNTER — Ambulatory Visit (INDEPENDENT_AMBULATORY_CARE_PROVIDER_SITE_OTHER): Payer: Medicare Other | Admitting: Pulmonary Disease

## 2012-10-06 ENCOUNTER — Encounter: Payer: Self-pay | Admitting: Pulmonary Disease

## 2012-10-06 VITALS — BP 124/82 | HR 60 | Temp 98.7°F | Ht 69.0 in | Wt 235.6 lb

## 2012-10-06 DIAGNOSIS — G4730 Sleep apnea, unspecified: Secondary | ICD-10-CM

## 2012-10-06 NOTE — Assessment & Plan Note (Signed)
Flu shot We will get you a new CPAP machine & check download in 1 month  Weight loss encouraged, compliance with goal of at least 4-6 hrs every night is the expectation. Advised against medications with sedative side effects Cautioned against driving when sleepy - understanding that sleepiness will vary on a day to day basis

## 2012-10-06 NOTE — Progress Notes (Signed)
Subjective:    Patient ID: Scott Gomez, male    DOB: August 25, 1934, 78 y.o.   MRN: [id number]  HPI  First seen 6/09  for excessive daytime somnolence & loud snoring. Reviewed PSG 07/2007 >> mod OSA , AHI 15.2/h with PLMs obliterated by CPAP 8, med FF mask  Smoked 2 PPD until 1995, worked in a Medical laboratory scientific officer x 25 yrs.  Every winter catches a cold that is hard ot shake off.  Spirometry 6/09>>FEV1 78% (surprisingly good), smaller airways affected 48%  Lost to FU x 44yrs, presents again , is machine is worn out & new CPAP has been recommended He has lost 15 lbs in the last 5y. Bedtime 11p, latency 30 mins, wakes x twice, no post void sleep latency, oob by 0900, feels rested, no dry mouth or headaches. No daytime naps, wife has come back into bedroom, she can tell if he does not use his cpap  Past Medical History  Diagnosis Date  . Testosterone deficiency   . Snoring   . Fatigue   . Hypopotassemia   . Leg cramps   . Carpal tunnel syndrome   . Arthritis     osteoarthritis both hands  . Palpitations     Hx of   . Shoulder pain, left   . Hypercholesteremia   . Ulcer     peptic ulcer disease  . Nephrolithiasis     Hx of  . Diabetes mellitus     type II  . Anxiety     Past Surgical History  Procedure Laterality Date  . Back surgery  1978    x2 disc    Allergies  Allergen Reactions  . Aspirin     REACTION: GI upset  . Buspirone Hcl     REACTION: itching  . Crestor [Rosuvastatin Calcium]     Increased his blood sugar   . Prednisone     REACTION: sick, per wife he can take this.    History   Social History  . Marital Status: Married    Spouse Name: N/A    Number of Children: N/A  . Years of Education: N/A   Occupational History  . Not on file.   Social History Main Topics  . Smoking status: Former Smoker    Quit date: 01/14/1993  . Smokeless tobacco: Never Used  . Alcohol Use: No  . Drug Use: No  . Sexual Activity: Not on file   Other Topics Concern  . Not  on file   Social History Narrative   Lives with wife Scott Gomez   Retired    Family History  Problem Relation Age of Onset  . Hypertension Mother   . Hypertension Father   . Cancer Sister   . Cancer Brother     kidney  . Heart disease Brother     MI  . Emphysema Sister   . Allergies Mother   . Allergies Father   . Heart attack Sister      Review of Systems  Constitutional: Negative for fever, chills, diaphoresis, activity change, appetite change, fatigue and unexpected weight change.  HENT: Negative for hearing loss, ear pain, nosebleeds, congestion, sore throat, facial swelling, rhinorrhea, sneezing, mouth sores, trouble swallowing, neck pain, neck stiffness, dental problem, voice change, postnasal drip, sinus pressure, tinnitus and ear discharge.   Eyes: Negative for photophobia, discharge, itching and visual disturbance.  Respiratory: Negative for apnea, cough, choking, chest tightness, shortness of breath, wheezing and stridor.   Cardiovascular: Positive for leg  swelling. Negative for chest pain and palpitations.  Gastrointestinal: Negative for nausea, vomiting, abdominal pain, constipation, blood in stool and abdominal distention.  Genitourinary: Negative for dysuria, urgency, frequency, hematuria, flank pain, decreased urine volume and difficulty urinating.  Musculoskeletal: Negative for myalgias, back pain, joint swelling, arthralgias and gait problem.  Skin: Negative for color change, pallor and rash.  Neurological: Negative for dizziness, tremors, seizures, syncope, speech difficulty, weakness, light-headedness, numbness and headaches.  Hematological: Negative for adenopathy. Does not bruise/bleed easily.  Psychiatric/Behavioral: Positive for hallucinations. Negative for confusion, sleep disturbance and agitation. The patient is not nervous/anxious.        Objective:   Physical Exam  Gen. Pleasant, obese, in no distress ENT - no lesions, no post nasal  drip Neck: No JVD, no thyromegaly, no carotid bruits Lungs: no use of accessory muscles, no dullness to percussion, decreased without rales or rhonchi  Cardiovascular: Rhythm regular, heart sounds  normal, no murmurs or gallops, no peripheral edema Musculoskeletal: No deformities, no cyanosis or clubbing , no tremors        Assessment & Plan:

## 2012-10-06 NOTE — Patient Instructions (Addendum)
Flu shot We will get you a new CPAP machine & check download in 1 month

## 2012-10-29 ENCOUNTER — Other Ambulatory Visit: Payer: Self-pay | Admitting: Family Medicine

## 2012-10-29 MED ORDER — LISINOPRIL 5 MG PO TABS: 5.0000 mg | ORAL_TABLET | Freq: Every day | ORAL | Status: DC

## 2012-11-19 ENCOUNTER — Other Ambulatory Visit: Payer: Self-pay

## 2012-11-24 ENCOUNTER — Encounter: Payer: Self-pay | Admitting: Radiology

## 2012-11-24 ENCOUNTER — Other Ambulatory Visit: Payer: Self-pay | Admitting: Family Medicine

## 2012-11-24 MED ORDER — ALPRAZOLAM 0.5 MG PO TABS: 0.5000 mg | ORAL_TABLET | Freq: Two times a day (BID) | ORAL | Status: DC | PRN

## 2012-11-24 NOTE — Telephone Encounter (Signed)
Px written for call in   

## 2012-11-24 NOTE — Telephone Encounter (Signed)
Rx called in as prescribed 

## 2012-11-25 ENCOUNTER — Encounter: Payer: Self-pay | Admitting: Family Medicine

## 2012-11-25 ENCOUNTER — Ambulatory Visit (INDEPENDENT_AMBULATORY_CARE_PROVIDER_SITE_OTHER): Payer: Medicare Other | Admitting: Family Medicine

## 2012-11-25 VITALS — BP 118/76 | HR 56 | Temp 98.6°F | Ht 68.25 in | Wt 225.5 lb

## 2012-11-25 DIAGNOSIS — R300 Dysuria: Secondary | ICD-10-CM

## 2012-11-25 DIAGNOSIS — Z0000 Encounter for general adult medical examination without abnormal findings: Secondary | ICD-10-CM | POA: Insufficient documentation

## 2012-11-25 DIAGNOSIS — E538 Deficiency of other specified B group vitamins: Secondary | ICD-10-CM

## 2012-11-25 DIAGNOSIS — M4800 Spinal stenosis, site unspecified: Secondary | ICD-10-CM

## 2012-11-25 DIAGNOSIS — I10 Essential (primary) hypertension: Secondary | ICD-10-CM

## 2012-11-25 DIAGNOSIS — R7309 Other abnormal glucose: Secondary | ICD-10-CM

## 2012-11-25 DIAGNOSIS — E7800 Pure hypercholesterolemia, unspecified: Secondary | ICD-10-CM

## 2012-11-25 DIAGNOSIS — Z1211 Encounter for screening for malignant neoplasm of colon: Secondary | ICD-10-CM | POA: Insufficient documentation

## 2012-11-25 DIAGNOSIS — Z125 Encounter for screening for malignant neoplasm of prostate: Secondary | ICD-10-CM

## 2012-11-25 DIAGNOSIS — R739 Hyperglycemia, unspecified: Secondary | ICD-10-CM

## 2012-11-25 DIAGNOSIS — E669 Obesity, unspecified: Secondary | ICD-10-CM

## 2012-11-25 LAB — POCT URINALYSIS DIPSTICK
Bilirubin, UA: NEGATIVE
Glucose, UA: NEGATIVE
Ketones, UA: NEGATIVE
Leukocytes, UA: NEGATIVE
Nitrite, UA: NEGATIVE
pH, UA: 6.5

## 2012-11-25 LAB — COMPREHENSIVE METABOLIC PANEL
ALT: 17 U/L (ref 0–53)
AST: 19 U/L (ref 0–37)
Alkaline Phosphatase: 52 U/L (ref 39–117)
BUN: 14 mg/dL (ref 6–23)
Calcium: 9.5 mg/dL (ref 8.4–10.5)
Chloride: 101 mEq/L (ref 96–112)
Creatinine, Ser: 0.9 mg/dL (ref 0.4–1.5)
Total Bilirubin: 0.9 mg/dL (ref 0.3–1.2)

## 2012-11-25 LAB — CBC WITH DIFFERENTIAL/PLATELET
Basophils Absolute: 0 10*3/uL (ref 0.0–0.1)
Basophils Relative: 0.5 % (ref 0.0–3.0)
Eosinophils Absolute: 0.2 10*3/uL (ref 0.0–0.7)
Eosinophils Relative: 2.3 % (ref 0.0–5.0)
HCT: 37.2 % — ABNORMAL LOW (ref 39.0–52.0)
Hemoglobin: 12.8 g/dL — ABNORMAL LOW (ref 13.0–17.0)
Lymphocytes Relative: 24.1 % (ref 12.0–46.0)
Lymphs Abs: 1.6 10*3/uL (ref 0.7–4.0)
MCHC: 34.5 g/dL (ref 30.0–36.0)
MCV: 85.2 fl (ref 78.0–100.0)
Monocytes Absolute: 0.5 10*3/uL (ref 0.1–1.0)
Monocytes Relative: 7.5 % (ref 3.0–12.0)
Neutro Abs: 4.4 10*3/uL (ref 1.4–7.7)
Neutrophils Relative %: 65.6 % (ref 43.0–77.0)
Platelets: 180 10*3/uL (ref 150.0–400.0)
RBC: 4.37 Mil/uL (ref 4.22–5.81)
RDW: 13.9 % (ref 11.5–14.6)
WBC: 6.7 10*3/uL (ref 4.5–10.5)

## 2012-11-25 LAB — VITAMIN B12: Vitamin B-12: 375 pg/mL (ref 211–911)

## 2012-11-25 LAB — LIPID PANEL
Cholesterol: 224 mg/dL — ABNORMAL HIGH (ref 0–200)
HDL: 45 mg/dL (ref >39.00)
Total CHOL/HDL Ratio: 5
Triglycerides: 230 mg/dL — ABNORMAL HIGH (ref 0.0–149.0)
VLDL: 46 mg/dL — ABNORMAL HIGH (ref 0.0–40.0)

## 2012-11-25 LAB — HEMOGLOBIN A1C: Hgb A1c MFr Bld: 6.5 % (ref 4.6–6.5)

## 2012-11-25 LAB — LDL CHOLESTEROL, DIRECT: Direct LDL: 149.9 mg/dL

## 2012-11-25 NOTE — Progress Notes (Signed)
Subjective:    Patient ID: Scott Gomez, male    DOB: 08-Jul-1934, 78 y.o.   MRN: [id number]  HPI I have personally reviewed the Medicare Annual Wellness questionnaire and have noted 1. The patient's medical and social history 2. Their use of alcohol, tobacco or illicit drugs 3. Their current medications and supplements 4. The patient's functional ability including ADL's, fall risks, home safety risks and hearing or visual             impairment. 5. Diet and physical activities 6. Evidence for depression or mood disorders  The patients weight, height, BMI have been recorded in the chart and visual acuity is per eye clinic.  I have made referrals, counseling and provided education to the patient based review of the above and I have provided the pt with a written personalized care plan for preventive services.  Pt is doing about the same  He sees pain management specialist - increased his gabapentin and he also takes oxycodone  Works hard on wt control and also has lost over 10 lb  Quit eating junk food  Time for labs today   See scanned forms.  Routine anticipatory guidance given to patient.  See health maintenance. Flu 9/14 vaccine  Shingles  May be interested in vaccine - ? About coverage  PNA 2/11 vaccine  Tetanus 8/11  Colonosc 2/00 - will do ifob card (due to age)   Prostate cancer screening-no family history of prostate problems (all past psa were neg) Advance directive - he does not have a living will  Cognitive function addressed- see scanned forms- and if abnormal then additional documentation follows. - overall his memory is ok these days (worse when in bad pain ) - much improvement in the past year  PMH and SH reviewed  Meds, vitals, and allergies reviewed.   ROS: See HPI.  Otherwise negative.    bp is stable today  No cp or palpitations or headaches or edema  No side effects to medicines  BP Readings from Last 3 Encounters:  11/25/12 118/76  10/06/12 124/82   11/12/11 138/86      Hyperglycemia Lab Results  Component Value Date   HGBA1C 5.9 11/05/2011   due for re check   B12 def Lab Results  Component Value Date   VITAMINB12 316 11/05/2011  due for re check   Prostate screen Lab Results  Component Value Date   PSA 0.95 11/05/2011   PSA 0.69 11/07/2010   PSA 0.76 01/14/2007    No family hx  No symptoms or nocturia at all  Prefers no DRE today  Falls -none lately - that stopped once he put on braces (AFO)- uses a cane for pain when he needs it  Much much better  For a while -that was a big problem Neuropathy   Mood is good also - much better with paxil     Patient Active Problem List   Diagnosis Date Noted  . Encounter for Medicare annual wellness exam 11/25/2012  . Spinal stenosis 08/27/2011  . Vitamin B 12 deficiency 08/06/2011  . Weakness generalized 08/06/2011  . Neuropathy 07/23/2011  . Obesity 05/13/2011  . Falls frequently 05/01/2011  . Memory loss 05/01/2011  . Headache 01/01/2011  . Prostate cancer screening 11/07/2010  . DEPRESSION 02/26/2010  . Essential hypertension, benign 03/29/2008  . C O P D 07/08/2007  . OBSTRUCTIVE SLEEP APNEA 07/08/2007  . TESTOSTERONE DEFICIENCY 06/22/2007  . Hyperglycemia 01/07/2007  . HYPERCHOLESTEROLEMIA 01/07/2007  . ANXIETY  01/07/2007  . CARPAL TUNNEL SYNDROME 01/07/2007  . PEPTIC ULCER DISEASE 01/07/2007  . OSTEOARTHRITIS, HANDS, BILATERAL 01/07/2007  . SHOULDER PAIN, LEFT, CHRONIC 01/07/2007  . PALPITATIONS, HX OF 01/07/2007  . NEPHROLITHIASIS, HX OF 01/07/2007   Past Medical History  Diagnosis Date  . Testosterone deficiency   . Snoring   . Fatigue   . Hypopotassemia   . Leg cramps   . Carpal tunnel syndrome   . Arthritis     osteoarthritis both hands  . Palpitations     Hx of   . Shoulder pain, left   . Hypercholesteremia   . Ulcer     peptic ulcer disease  . Nephrolithiasis     Hx of  . Diabetes mellitus     type II  . Anxiety    Past  Surgical History  Procedure Laterality Date  . Back surgery  1978    x2 disc   History  Substance Use Topics  . Smoking status: Former Smoker    Quit date: 01/14/1993  . Smokeless tobacco: Never Used  . Alcohol Use: No   Family History  Problem Relation Age of Onset  . Hypertension Mother   . Hypertension Father   . Cancer Sister   . Cancer Brother     kidney  . Heart disease Brother     MI  . Emphysema Sister   . Allergies Mother   . Allergies Father   . Heart attack Sister    Allergies  Allergen Reactions  . Aspirin     REACTION: GI upset  . Buspirone Hcl     REACTION: itching  . Crestor [Rosuvastatin Calcium]     Increased his blood sugar   . Prednisone     REACTION: sick, per wife he can take this.   Current Outpatient Prescriptions on File Prior to Visit  Medication Sig Dispense Refill  . ALPRAZolam (XANAX) 0.5 MG tablet Take 1 tablet (0.5 mg total) by mouth 2 (two) times daily as needed for anxiety.  60 tablet  3  . B Complex Vitamins (VITAMIN B COMPLEX PO) Take 1 tablet by mouth daily.      . bisoprolol (ZEBETA) 5 MG tablet Take 1 tablet (5 mg total) by mouth daily.  30 tablet  6  . cyclobenzaprine (FLEXERIL) 10 MG tablet Take 10 mg by mouth 3 (three) times daily as needed. For muscle spasms       . gabapentin (NEURONTIN) 100 MG capsule Take 100 mg by mouth. Take 2 capsules three times a day by mouth.      Marland Kitchen glucose blood test strip Bayer Contour-Test once daily and as needed.  100 each  3  . lisinopril (PRINIVIL,ZESTRIL) 5 MG tablet Take 1 tablet (5 mg total) by mouth daily.  30 tablet  1  . Misc Natural Products (GREEN TEA SLIM) TABS Take 1 tablet by mouth at bedtime.        Marland Kitchen PARoxetine (PAXIL) 40 MG tablet Take 1 tablet (40 mg total) by mouth every morning.  30 tablet  2  . potassium chloride SA (K-DUR,KLOR-CON) 20 MEQ tablet Take 1 tablet (20 mEq total) by mouth daily.  30 tablet  5  . travoprost, benzalkonium, (TRAVATAN) 0.004 % ophthalmic solution Place  1 drop into both eyes at bedtime.         No current facility-administered medications on file prior to visit.    Review of Systems    Review of Systems  Constitutional: Negative for fever,  appetite change,  and unexpected weight change.  Eyes: Negative for pain and visual disturbance.  Respiratory: Negative for cough and shortness of breath.   Cardiovascular: Negative for cp or palpitations    Gastrointestinal: Negative for nausea, diarrhea and constipation.  Genitourinary: Negative for urgency and frequency. no excessive thirst  Skin: Negative for pallor or rash   MSK pos for baseline back pain / pos for imp in gait with use of AFOs Neurological: Negative for weakness, light-headedness, numbness and headaches.  Hematological: Negative for adenopathy. Does not bruise/bleed easily.  Psychiatric/Behavioral: Negative for dysphoric mood. The patient is occ anxious but overall much improvement      Objective:   Physical Exam  Constitutional: He is oriented to person, place, and time. He appears well-developed and well-nourished. No distress.  overwt and well app  HENT:  Head: Normocephalic and atraumatic.  Right Ear: External ear normal.  Left Ear: External ear normal.  Nose: Nose normal.  Mouth/Throat: Oropharynx is clear and moist.  Eyes: Conjunctivae and EOM are normal. Pupils are equal, round, and reactive to light. Right eye exhibits no discharge. Left eye exhibits no discharge. No scleral icterus.  Neck: Normal range of motion. Neck supple. No JVD present. Carotid bruit is not present. No thyromegaly present.  Cardiovascular: Normal rate, regular rhythm, normal heart sounds and intact distal pulses.  Exam reveals no gallop.   Pulmonary/Chest: Effort normal and breath sounds normal. No respiratory distress. He has no wheezes. He has no rales.  Abdominal: Soft. Bowel sounds are normal. He exhibits no distension, no abdominal bruit and no mass. There is no tenderness.   Genitourinary:  DRE def today  Musculoskeletal: Normal range of motion. He exhibits no edema and no tenderness.  Lymphadenopathy:    He has no cervical adenopathy.  Neurological: He is alert and oriented to person, place, and time. He has normal reflexes. No cranial nerve deficit. He exhibits normal muscle tone. Coordination normal.  Much improved gait with AFOs- steady   Skin: Skin is warm and dry. No rash noted. No erythema. No pallor.  Some SK  Psychiatric: He has a normal mood and affect.  Brighter affect today          Assessment & Plan:

## 2012-11-25 NOTE — Progress Notes (Signed)
Pre-visit discussion using our clinic review tool. No additional management support is needed unless otherwise documented below in the visit note.  

## 2012-11-25 NOTE — Patient Instructions (Addendum)
If you are interested in a shingles/zoster vaccine - call your insurance to check on coverage,( you should not get it within 1 month of other vaccines) , then call us for a prescription  for it to take to a pharmacy that gives the shot , or make a nurse visit to get it here depending on your coverage Here is a handout to read about the vaccine  Here is some info on preparing advance directive/living will Please do stool card for colon cancer screening  We are no longer screening for prostate cancer- but let me know if you have any problems

## 2012-11-29 NOTE — Assessment & Plan Note (Signed)
Symptoms are much better controlled now - also gait improved with AFOs for foot drop  No more falls

## 2012-11-29 NOTE — Assessment & Plan Note (Signed)
Discussed how this problem influences overall health and the risks it imposes  Reviewed plan for weight loss with lower calorie diet (via better food choices and also portion control or program like weight watchers) and exercise building up to or more than 30 minutes 5 days per week including some aerobic activity    

## 2012-11-29 NOTE — Assessment & Plan Note (Signed)
D/w patient re:options for colon cancer screening, including IFOB vs. colonoscopy.  Risks and benefits of both were discussed and patient voiced understanding.  Pt elects for:IFOB card  This was given  

## 2012-11-29 NOTE — Assessment & Plan Note (Signed)
Pt is over 77- with no symptoms or fam hx  Will stop screening at this time Rev s/s of prostate dz and he will update if anything develops

## 2012-11-29 NOTE — Assessment & Plan Note (Signed)
A1C today Disc imp of wt loss and low glycemic diet Exercise as tolerated

## 2012-11-29 NOTE — Assessment & Plan Note (Signed)
Lab today Disc goals for lipids and reasons to control them Rev labs with pt  (from last draw) Rev low sat fat diet in detail  

## 2012-11-29 NOTE — Assessment & Plan Note (Signed)
Level today with current supplementation Pt feels good

## 2012-11-29 NOTE — Assessment & Plan Note (Addendum)
Reviewed health habits including diet and exercise and skin cancer prevention Also reviewed health mt list, fam hx and immunizations  See HPI Pt wil check on coverage of zoster vaccine  Labs today

## 2012-11-29 NOTE — Assessment & Plan Note (Signed)
BP: 118/76 mmHg  bp in fair control at this time  No changes needed Disc lifstyle change with low sodium diet and exercise  Lab today 

## 2012-12-09 ENCOUNTER — Other Ambulatory Visit: Payer: Self-pay | Admitting: Family Medicine

## 2012-12-09 DIAGNOSIS — R7989 Other specified abnormal findings of blood chemistry: Secondary | ICD-10-CM

## 2012-12-14 ENCOUNTER — Encounter: Payer: Self-pay | Admitting: Family Medicine

## 2012-12-15 ENCOUNTER — Other Ambulatory Visit: Payer: Medicare Other

## 2012-12-16 ENCOUNTER — Other Ambulatory Visit: Payer: Self-pay | Admitting: *Deleted

## 2012-12-16 ENCOUNTER — Other Ambulatory Visit (INDEPENDENT_AMBULATORY_CARE_PROVIDER_SITE_OTHER): Payer: Medicare Other

## 2012-12-16 ENCOUNTER — Encounter: Payer: Self-pay | Admitting: Family Medicine

## 2012-12-16 DIAGNOSIS — R6889 Other general symptoms and signs: Secondary | ICD-10-CM

## 2012-12-16 DIAGNOSIS — E7800 Pure hypercholesterolemia, unspecified: Secondary | ICD-10-CM

## 2012-12-16 DIAGNOSIS — F411 Generalized anxiety disorder: Secondary | ICD-10-CM

## 2012-12-16 DIAGNOSIS — R7989 Other specified abnormal findings of blood chemistry: Secondary | ICD-10-CM

## 2012-12-16 LAB — T4, FREE: Free T4: 0.73 ng/dL (ref 0.60–1.60)

## 2012-12-16 LAB — TSH: TSH: 1.19 u[IU]/mL (ref 0.35–5.50)

## 2012-12-16 MED ORDER — GLUCOSE BLOOD VI STRP: ORAL_STRIP | Status: DC

## 2012-12-22 ENCOUNTER — Other Ambulatory Visit: Payer: Self-pay | Admitting: Family Medicine

## 2012-12-22 NOTE — Telephone Encounter (Signed)
Last filled 11/23/12 

## 2012-12-22 NOTE — Telephone Encounter (Signed)
Please refill for a year  

## 2012-12-23 ENCOUNTER — Other Ambulatory Visit: Payer: Self-pay | Admitting: Family Medicine

## 2012-12-23 MED ORDER — PAROXETINE HCL 40 MG PO TABS: 40.0000 mg | ORAL_TABLET | ORAL | Status: DC

## 2012-12-23 NOTE — Telephone Encounter (Signed)
done

## 2013-01-21 ENCOUNTER — Encounter: Payer: Self-pay | Admitting: Internal Medicine

## 2013-01-21 ENCOUNTER — Telehealth: Payer: Self-pay | Admitting: Radiology

## 2013-01-21 ENCOUNTER — Ambulatory Visit (INDEPENDENT_AMBULATORY_CARE_PROVIDER_SITE_OTHER): Payer: Medicare Other | Admitting: Internal Medicine

## 2013-01-21 ENCOUNTER — Other Ambulatory Visit: Payer: Self-pay | Admitting: Family Medicine

## 2013-01-21 ENCOUNTER — Other Ambulatory Visit (INDEPENDENT_AMBULATORY_CARE_PROVIDER_SITE_OTHER): Payer: Medicare Other

## 2013-01-21 VITALS — BP 146/84 | HR 56 | Temp 97.3°F | Wt 234.5 lb

## 2013-01-21 DIAGNOSIS — Z1211 Encounter for screening for malignant neoplasm of colon: Secondary | ICD-10-CM

## 2013-01-21 DIAGNOSIS — L03221 Cellulitis of neck: Secondary | ICD-10-CM

## 2013-01-21 DIAGNOSIS — L0211 Cutaneous abscess of neck: Secondary | ICD-10-CM

## 2013-01-21 LAB — FECAL OCCULT BLOOD, IMMUNOCHEMICAL: FECAL OCCULT BLD: POSITIVE — AB

## 2013-01-21 MED ORDER — SULFAMETHOXAZOLE-TMP DS 800-160 MG PO TABS: 1.0000 | ORAL_TABLET | Freq: Two times a day (BID) | ORAL | Status: DC

## 2013-01-21 NOTE — Progress Notes (Signed)
Subjective:    Patient ID: Scott Gomez, male    DOB: 29-Nov-1934, 79 y.o.   MRN: [id number]  HPI  Pt presents to the clinic today with c/o a bump on his neck. He noticed this bump 2 weeks ago. He reports it has gotten larger, more tender and red. He has not had any fever. His wife did put peroxide on it and was able to express a large amount of pus from it. He is here today to have it rechecked.  Review of Systems      Past Medical History  Diagnosis Date  . Testosterone deficiency   . Snoring   . Fatigue   . Hypopotassemia   . Leg cramps   . Carpal tunnel syndrome   . Arthritis     osteoarthritis both hands  . Palpitations     Hx of   . Shoulder pain, left   . Hypercholesteremia   . Ulcer     peptic ulcer disease  . Nephrolithiasis     Hx of  . Diabetes mellitus     type II  . Anxiety     Current Outpatient Prescriptions  Medication Sig Dispense Refill  . ALPRAZolam (XANAX) 0.5 MG tablet Take 1 tablet (0.5 mg total) by mouth 2 (two) times daily as needed for anxiety.  60 tablet  3  . B Complex Vitamins (VITAMIN B COMPLEX PO) Take 1 tablet by mouth daily.      . bisoprolol (ZEBETA) 5 MG tablet Take 1 tablet (5 mg total) by mouth daily.  30 tablet  6  . cyclobenzaprine (FLEXERIL) 10 MG tablet Take 10 mg by mouth 3 (three) times daily as needed. For muscle spasms       . gabapentin (NEURONTIN) 100 MG capsule Take 100 mg by mouth. Take 2 capsules three times a day by mouth.      Marland Kitchen glucose blood test strip Bayer Contour-Test once daily and as needed.  Dx: 790.29  100 each  5  . lisinopril (PRINIVIL,ZESTRIL) 5 MG tablet TAKE ONE (1) TABLET BY MOUTH EVERY DAY  30 tablet  5  . Misc Natural Products (GREEN TEA SLIM) TABS Take 1 tablet by mouth at bedtime.        . Oxycodone HCl 10 MG TABS Take 1 tablet by mouth every 6 (six) hours as needed.      Marland Kitchen PARoxetine (PAXIL) 40 MG tablet Take 1 tablet (40 mg total) by mouth every morning.  30 tablet  11  . potassium chloride SA  (K-DUR,KLOR-CON) 20 MEQ tablet TAKE ONE (1) TABLET BY MOUTH EVERY DAY  30 tablet  5  . travoprost, benzalkonium, (TRAVATAN) 0.004 % ophthalmic solution Place 1 drop into both eyes at bedtime.         No current facility-administered medications for this visit.    Allergies  Allergen Reactions  . Aspirin     REACTION: GI upset  . Buspirone Hcl     REACTION: itching  . Crestor [Rosuvastatin Calcium]     Increased his blood sugar   . Prednisone     REACTION: sick, per wife he can take this.    Family History  Problem Relation Age of Onset  . Hypertension Mother   . Hypertension Father   . Cancer Sister   . Cancer Brother     kidney  . Heart disease Brother     MI  . Emphysema Sister   . Allergies Mother   . Allergies  Father   . Heart attack Sister     History   Social History  . Marital Status: Married    Spouse Name: N/A    Number of Children: N/A  . Years of Education: N/A   Occupational History  . Not on file.   Social History Main Topics  . Smoking status: Former Smoker    Quit date: 01/14/1993  . Smokeless tobacco: Never Used  . Alcohol Use: No  . Drug Use: No  . Sexual Activity: Not on file   Other Topics Concern  . Not on file   Social History Narrative   Lives with wife Zamarion Longest   Retired     Constitutional: Denies fever, malaise, fatigue, headache or abrupt weight changes.  Skin: Denies rashes, or ulcercations.    No other specific complaints in a complete review of systems (except as listed in HPI above).  Objective:   Physical Exam   BP 146/84  Pulse 56  Temp(Src) 97.3 F (36.3 C) (Oral)  Wt 234 lb 8 oz (106.369 kg)  SpO2 98% Wt Readings from Last 3 Encounters:  01/21/13 234 lb 8 oz (106.369 kg)  11/25/12 225 lb 8 oz (102.286 kg)  10/06/12 235 lb 9.6 oz (106.867 kg)    General: Appears her stated age, well developed, well nourished in NAD. Skin: Warm, dry and intact. Quarter size abscess noted on left medial neck,  tender to touch.  Cardiovascular: Normal rate and rhythm. S1,S2 noted.  No murmur, rubs or gallops noted. No JVD or BLE edema. No carotid bruits noted. Pulmonary/Chest: Normal effort and positive vesicular breath sounds. No respiratory distress. No wheezes, rales or ronchi noted.   BMET    Component Value Date/Time   NA 137 11/25/2012 1301   K 4.0 11/25/2012 1301   CL 101 11/25/2012 1301   CO2 29 11/25/2012 1301   GLUCOSE 93 11/25/2012 1301   BUN 14 11/25/2012 1301   CREATININE 0.9 11/25/2012 1301   CALCIUM 9.5 11/25/2012 1301   GFRNONAA 83* 01/03/2011 0615   GFRAA >90 01/03/2011 0615    Lipid Panel     Component Value Date/Time   CHOL 224* 11/25/2012 1301   TRIG 230.0* 11/25/2012 1301   HDL 45.00 11/25/2012 1301   CHOLHDL 5 11/25/2012 1301   VLDL 46.0* 11/25/2012 1301   LDLCALC 113* 05/06/2011 1028    CBC    Component Value Date/Time   WBC 6.7 11/25/2012 1301   RBC 4.37 11/25/2012 1301   HGB 12.8* 11/25/2012 1301   HCT 37.2* 11/25/2012 1301   PLT 180.0 11/25/2012 1301   MCV 85.2 11/25/2012 1301   MCH 29.0 01/03/2011 0615   MCHC 34.5 11/25/2012 1301   RDW 13.9 11/25/2012 1301   LYMPHSABS 1.6 11/25/2012 1301   MONOABS 0.5 11/25/2012 1301   EOSABS 0.2 11/25/2012 1301   BASOSABS 0.0 11/25/2012 1301    Hgb A1C Lab Results  Component Value Date   HGBA1C 6.5 11/25/2012        Assessment & Plan:   Abscess of neck:  Will perform I and D eRx for septra BID x 7 days  Procedure Note:  Benefits and Risk of procedure explained (infection, bleeding, sepsis) Informed Consent obtained (will scan into chart) Area cleansed with CHG x 2 and draped with sterile towels Pt numbed with 1% lidocaine with epi- approx 2 mls Abscess incised with # 15 scalpel Pus expressed, loculations broken up Wound packed with 1/4 in sterile pack strip Wound covered with triple  antibiotic ointment and covered with non adherent dressing and tape Aftercare instructions given  Pt  tolerated procedure well, no complications noted  RTC on Monday for recheck

## 2013-01-21 NOTE — Telephone Encounter (Signed)
Please let pt know that his stool card returned positive for blood- so I want to refer him for a colonoscopy to see if they can find a source - if agreeable-I will go ahead with a referral

## 2013-01-21 NOTE — Telephone Encounter (Signed)
Manuela Schwartz at [facility] called a critical Ifob result POSITIVE

## 2013-01-21 NOTE — Patient Instructions (Signed)
Abscess  Care After  An abscess (also called a boil or furuncle) is an infected area that contains a collection of pus. Signs and symptoms of an abscess include pain, tenderness, redness, or hardness, or you may feel a moveable soft area under your skin. An abscess can occur anywhere in the body. The infection may spread to surrounding tissues causing cellulitis. A cut (incision) by the surgeon was made over your abscess and the pus was drained out. Gauze may have been packed into the space to provide a drain that will allow the cavity to heal from the inside outwards. The boil may be painful for 5 to 7 days. Most people with a boil do not have high fevers. Your abscess, if seen early, may not have localized, and may not have been lanced. If not, another appointment may be required for this if it does not get better on its own or with medications.  HOME CARE INSTRUCTIONS   · Only take over-the-counter or prescription medicines for pain, discomfort, or fever as directed by your caregiver.  · When you bathe, soak and then remove gauze or iodoform packs at least daily or as directed by your caregiver. You may then wash the wound gently with mild soapy water. Repack with gauze or do as your caregiver directs.  SEEK IMMEDIATE MEDICAL CARE IF:   · You develop increased pain, swelling, redness, drainage, or bleeding in the wound site.  · You develop signs of generalized infection including muscle aches, chills, fever, or a general ill feeling.  · An oral temperature above 102° F (38.9° C) develops, not controlled by medication.  See your caregiver for a recheck if you develop any of the symptoms described above. If medications (antibiotics) were prescribed, take them as directed.  Document Released: 07/19/2004 Document Revised: 03/25/2011 Document Reviewed: 03/16/2007  ExitCare® Patient Information ©2014 [employer].

## 2013-01-21 NOTE — Progress Notes (Signed)
Pre-visit discussion using our clinic review tool. No additional management support is needed unless otherwise documented below in the visit note.  

## 2013-01-22 NOTE — Telephone Encounter (Signed)
Called pt and no voicemail and no answer (kept ringing), will try to call back later

## 2013-01-25 NOTE — Telephone Encounter (Signed)
Pt notified stool card positive for blood and Dr. Glori Bickers wants to refer him to have a colonoscopy. Pt refused to have a colonoscopy he said "it aint going to happen". So I asked pt what if she just referred him to a GI doctor for eval., pt refused too he said he "isn't going to anymore doctors at all", I advise pt I will let Dr. Glori Bickers know

## 2013-01-25 NOTE — Telephone Encounter (Signed)
Ok - it is documented in the chart that he understands that blood in stool could be a sign of bleeding/ polyp/ or even a cancer and if he changes his mind at all about a GI visit please let me know  Just make sure he understands

## 2013-01-25 NOTE — Telephone Encounter (Signed)
Spoke with wife and she said pt is still refusing, and he has had a history of polyps in the past, I did advise pt of what blood in the stool could be a sign of

## 2013-03-16 ENCOUNTER — Other Ambulatory Visit: Payer: Self-pay | Admitting: Family Medicine

## 2013-03-16 NOTE — Telephone Encounter (Signed)
Electronic refill request, please advise  

## 2013-03-16 NOTE — Telephone Encounter (Signed)
Px written for call in   

## 2013-03-17 NOTE — Telephone Encounter (Signed)
Rx called in as prescribed 

## 2013-04-26 ENCOUNTER — Telehealth: Payer: Self-pay | Admitting: *Deleted

## 2013-04-26 NOTE — Telephone Encounter (Signed)
PA forms faxed to insurance

## 2013-04-27 NOTE — Telephone Encounter (Signed)
PA declined, denial letter in your inbox. The letter does gives the preferred testing strips (I highlighted it), please advise

## 2013-04-27 NOTE — Telephone Encounter (Signed)
New px attached to the denial in IN box-thanks

## 2013-04-29 NOTE — Telephone Encounter (Signed)
New Rx faxed to [facility]

## 2013-05-06 ENCOUNTER — Other Ambulatory Visit: Payer: Self-pay | Admitting: *Deleted

## 2013-05-06 MED ORDER — ONETOUCH VERIO IQ SYSTEM W/DEVICE KIT: PACK | Status: DC

## 2013-05-06 MED ORDER — ONETOUCH LANCETS MISC: Status: AC

## 2013-06-17 ENCOUNTER — Other Ambulatory Visit: Payer: Self-pay | Admitting: Family Medicine

## 2013-06-18 NOTE — Telephone Encounter (Signed)
Rx sent through e-scribe  

## 2013-06-18 NOTE — Telephone Encounter (Signed)
Please refillf or 6 mo , thanks

## 2013-06-18 NOTE — Telephone Encounter (Signed)
Last filled 12/23/2013 and last office visit was acute with Webb Silversmith 01/2013--please advise

## 2013-07-16 ENCOUNTER — Other Ambulatory Visit: Payer: Self-pay | Admitting: Family Medicine

## 2013-07-19 NOTE — Telephone Encounter (Signed)
Px written for call in   

## 2013-07-19 NOTE — Telephone Encounter (Signed)
Electronic refill request, please advise  

## 2013-07-19 NOTE — Telephone Encounter (Signed)
Rx called in to pharmacy. 

## 2013-11-09 ENCOUNTER — Other Ambulatory Visit: Payer: Self-pay | Admitting: Family Medicine

## 2013-11-09 ENCOUNTER — Other Ambulatory Visit: Payer: Self-pay

## 2013-11-09 MED ORDER — ALPRAZOLAM 0.5 MG PO TABS: ORAL_TABLET | ORAL | Status: DC

## 2013-11-09 NOTE — Telephone Encounter (Signed)
VM left pt has appt in 03/2014 with Dr Glori Bickers; [facility] refill [facility]olam [facility].Please advise.

## 2013-11-09 NOTE — Telephone Encounter (Signed)
Rx called in as prescribed 

## 2013-11-09 NOTE — Telephone Encounter (Signed)
Px written for call in   

## 2013-11-11 ENCOUNTER — Ambulatory Visit (INDEPENDENT_AMBULATORY_CARE_PROVIDER_SITE_OTHER): Payer: Medicare Other

## 2013-11-11 ENCOUNTER — Telehealth: Payer: Self-pay | Admitting: Family Medicine

## 2013-11-11 DIAGNOSIS — Z23 Encounter for immunization: Secondary | ICD-10-CM

## 2013-11-11 NOTE — Telephone Encounter (Signed)
I advised pt that rx had been called in to his pharmacy on 11/09/13. I called [facility], they confirmed receiving rx , and that his rx is ready. I advised pt.

## 2013-11-11 NOTE — Telephone Encounter (Signed)
Pt came in today to get flu shot.  He wanted to get a refill on alprazolam 0.5mg  tab Take one tablet by mouth two times daily as needed for anxiety  [facility] is out of meds  He stated he has been trying to get this refilled since Monday 11/08/13  Please advise pt when this is done

## 2013-12-13 ENCOUNTER — Other Ambulatory Visit: Payer: Self-pay | Admitting: Family Medicine

## 2013-12-14 ENCOUNTER — Other Ambulatory Visit: Payer: Self-pay | Admitting: Pulmonary Disease

## 2013-12-14 DIAGNOSIS — G4733 Obstructive sleep apnea (adult) (pediatric): Secondary | ICD-10-CM

## 2014-01-12 ENCOUNTER — Other Ambulatory Visit: Payer: Self-pay | Admitting: Family Medicine

## 2014-01-21 ENCOUNTER — Other Ambulatory Visit: Payer: Self-pay | Admitting: Family Medicine

## 2014-03-01 DIAGNOSIS — M62830 Muscle spasm of back: Secondary | ICD-10-CM | POA: Diagnosis not present

## 2014-03-01 DIAGNOSIS — M47817 Spondylosis without myelopathy or radiculopathy, lumbosacral region: Secondary | ICD-10-CM | POA: Diagnosis not present

## 2014-03-01 DIAGNOSIS — G609 Hereditary and idiopathic neuropathy, unspecified: Secondary | ICD-10-CM | POA: Diagnosis not present

## 2014-03-01 DIAGNOSIS — G894 Chronic pain syndrome: Secondary | ICD-10-CM | POA: Diagnosis not present

## 2014-03-11 ENCOUNTER — Other Ambulatory Visit: Payer: Self-pay | Admitting: Family Medicine

## 2014-03-11 NOTE — Telephone Encounter (Signed)
Px written for call in   

## 2014-03-11 NOTE — Telephone Encounter (Signed)
Called in the Rx as instructed to pharmacy.

## 2014-03-11 NOTE — Telephone Encounter (Signed)
Ok to refill? Last prescribed on 11/09/13. Last seen for acute on 01/21/13. A CPE schedule on 03/23/14.

## 2014-03-23 ENCOUNTER — Encounter: Payer: Self-pay | Admitting: Family Medicine

## 2014-03-23 ENCOUNTER — Ambulatory Visit (INDEPENDENT_AMBULATORY_CARE_PROVIDER_SITE_OTHER): Payer: Medicare Other | Admitting: Family Medicine

## 2014-03-23 ENCOUNTER — Encounter: Payer: Self-pay | Admitting: *Deleted

## 2014-03-23 VITALS — BP 126/76 | HR 76 | Temp 98.6°F | Ht 68.0 in | Wt 223.0 lb

## 2014-03-23 DIAGNOSIS — Z0000 Encounter for general adult medical examination without abnormal findings: Secondary | ICD-10-CM | POA: Diagnosis not present

## 2014-03-23 DIAGNOSIS — Z23 Encounter for immunization: Secondary | ICD-10-CM

## 2014-03-23 DIAGNOSIS — I10 Essential (primary) hypertension: Secondary | ICD-10-CM | POA: Diagnosis not present

## 2014-03-23 DIAGNOSIS — G629 Polyneuropathy, unspecified: Secondary | ICD-10-CM | POA: Insufficient documentation

## 2014-03-23 DIAGNOSIS — Z1211 Encounter for screening for malignant neoplasm of colon: Secondary | ICD-10-CM

## 2014-03-23 DIAGNOSIS — E538 Deficiency of other specified B group vitamins: Secondary | ICD-10-CM | POA: Diagnosis not present

## 2014-03-23 DIAGNOSIS — E7800 Pure hypercholesterolemia, unspecified: Secondary | ICD-10-CM

## 2014-03-23 DIAGNOSIS — R739 Hyperglycemia, unspecified: Secondary | ICD-10-CM | POA: Diagnosis not present

## 2014-03-23 NOTE — Patient Instructions (Signed)
Labs today prevnar vaccine today  Please consider working on and advance directive/living will -look at the materials I gave you  Take care of yourself

## 2014-03-23 NOTE — Progress Notes (Signed)
Pre visit review using our clinic review tool, if applicable. No additional management support is needed unless otherwise documented below in the visit note. 

## 2014-03-23 NOTE — Progress Notes (Signed)
Subjective:    Patient ID: Scott Gomez, male    DOB: [birth year], 80 y.o.   MRN: [id number]  HPI Here for annual medicare wellness visit as well as chronic/acute medical problems   Wt is down 11 lb with bmi of 33 Not trying to loose but eats a bit differently - chicken and broccoli - and also eating less  Same old aches and pains -feels about the same  Harder to get around -uses a cane No falls in the last year   Has bad neuropathy-dealing with that   I have personally reviewed the Medicare Annual Wellness questionnaire and have noted 1. The patient's medical and social history 2. Their use of alcohol, tobacco or illicit drugs 3. Their current medications and supplements 4. The patient's functional ability including ADL's, fall risks, home safety risks and hearing or visual             impairment. 5. Diet and physical activities 6. Evidence for depression or mood disorders  The patients weight, height, BMI have been recorded in the chart and visual acuity is per eye clinic.  I have made referrals, counseling and provided education to the patient based review of the above and I have provided the pt with a written personalized care plan for preventive services.  See scanned forms.  Routine anticipatory guidance given to patient.  See health maintenance. Colon cancer screening 2/00 - declined another one even after pos ifob card Has not seen blood in stool, and declines further stool cards - states he would not treat colon cancer if he had it  Flu vaccine 10/15  Tetanus vaccine 8/11  Pneumovax 2/11 , and wants the prevnar vaccine  Zoster vaccine-not interested in No longer wants to screen for prostate cancer  Advance directive - has not done- given materials to work on that  Cognitive function addressed- see scanned forms- and if abnormal then additional documentation follows. No concerns about memory   PMH and SH reviewed  Meds, vitals, and allergies reviewed.   ROS: See  HPI.  Otherwise negative.    Due for labs   Hx of hyperglycemia Lab Results  Component Value Date   HGBA1C 6.5 11/25/2012   He stays away from sugar as much as he can   Has polyneuropathy Lab Results  Component Value Date   VITAMINB12 375 11/25/2012    He is on fosamax - from pain management  He has had fractures in spine  Unsure if he has had a bone density test   Mood is ok -hx of depression   Hx of hyperlipidemia Lab Results  Component Value Date   CHOL 224* 11/25/2012   HDL 45.00 11/25/2012   LDLCALC 113* 05/06/2011   LDLDIRECT 149.9 11/25/2012   TRIG 230.0* 11/25/2012   CHOLHDL 5 11/25/2012   Due for a check  He does watch his diet     Patient Active Problem List   Diagnosis Date Noted  . Encounter for Medicare annual wellness exam 11/25/2012  . Colon cancer screening 11/25/2012  . Spinal stenosis 08/27/2011  . Vitamin B 12 deficiency 08/06/2011  . Neuropathy 07/23/2011  . Obesity 05/13/2011  . Falls frequently 05/01/2011  . Headache 01/01/2011  . Prostate cancer screening 11/07/2010  . DEPRESSION 02/26/2010  . Essential hypertension, benign 03/29/2008  . C O P D 07/08/2007  . OBSTRUCTIVE SLEEP APNEA 07/08/2007  . TESTOSTERONE DEFICIENCY 06/22/2007  . Hyperglycemia 01/07/2007  . HYPERCHOLESTEROLEMIA 01/07/2007  . ANXIETY 01/07/2007  .  CARPAL TUNNEL SYNDROME 01/07/2007  . PEPTIC ULCER DISEASE 01/07/2007  . OSTEOARTHRITIS, HANDS, BILATERAL 01/07/2007  . SHOULDER PAIN, LEFT, CHRONIC 01/07/2007  . PALPITATIONS, HX OF 01/07/2007  . NEPHROLITHIASIS, HX OF 01/07/2007   Past Medical History  Diagnosis Date  . Testosterone deficiency   . Snoring   . Fatigue   . Hypopotassemia   . Leg cramps   . Carpal tunnel syndrome   . Arthritis     osteoarthritis both hands  . Palpitations     Hx of   . Shoulder pain, left   . Hypercholesteremia   . Ulcer     peptic ulcer disease  . Nephrolithiasis     Hx of  . Diabetes mellitus     type II  . Anxiety      Past Surgical History  Procedure Laterality Date  . Back surgery  1978    x2 disc   History  Substance Use Topics  . Smoking status: Former Smoker    Quit date: 01/14/1993  . Smokeless tobacco: Never Used  . Alcohol Use: No   Family History  Problem Relation Age of Onset  . Hypertension Mother   . Hypertension Father   . Cancer Sister   . Cancer Brother     kidney  . Heart disease Brother     MI  . Emphysema Sister   . Allergies Mother   . Allergies Father   . Heart attack Sister    Allergies  Allergen Reactions  . Aspirin     REACTION: GI upset  . Buspirone Hcl     REACTION: itching  . Crestor [Rosuvastatin Calcium]     Increased his blood sugar   . Prednisone     REACTION: sick, per wife he can take this.   Current Outpatient Prescriptions on File Prior to Visit  Medication Sig Dispense Refill  . ALPRAZolam (XANAX) 0.5 MG tablet TAKE ONE (1) TABLET BY MOUTH TWO (2) TIMES DAILY AS NEEDED FOR ANXIETY 60 tablet 3  . B Complex Vitamins (VITAMIN B COMPLEX PO) Take 1 tablet by mouth daily.    . Blood Glucose Monitoring Suppl (ONETOUCH VERIO IQ SYSTEM) W/DEVICE KIT Use to test blood sugar once daily and as needed dx 790.29 1 kit 0  . cyclobenzaprine (FLEXERIL) 10 MG tablet Take 10 mg by mouth 3 (three) times daily as needed. For muscle spasms     . gabapentin (NEURONTIN) 100 MG capsule Take 100 mg by mouth. Take 2 capsules three times a day by mouth.    Marland Kitchen glucose blood test strip Bayer Contour-Test once daily and as needed.  Dx: 790.29 100 each 5  . lisinopril (PRINIVIL,ZESTRIL) 5 MG tablet TAKE ONE (1) TABLET BY MOUTH EVERY DAY 30 tablet 2  . Misc Natural Products (GREEN TEA SLIM) TABS Take 1 tablet by mouth at bedtime.      . ONE TOUCH LANCETS MISC Use to test blood sugar once daily and as needed. Dx 790.29 200 each 1  . Oxycodone HCl 10 MG TABS Take 1 tablet by mouth every 6 (six) hours as needed.    . potassium chloride SA (K-DUR,KLOR-CON) 20 MEQ tablet TAKE ONE  (1) TABLET BY MOUTH EVERY DAY 30 tablet 2  . travoprost, benzalkonium, (TRAVATAN) 0.004 % ophthalmic solution Place 1 drop into both eyes at bedtime.      . bisoprolol (ZEBETA) 5 MG tablet TAKE ONE (1) TABLET BY MOUTH EVERY DAY (Patient not taking: Reported on 03/23/2014) 30 tablet 5  .  PARoxetine (PAXIL) 40 MG tablet Take 1 tablet (40 mg total) by mouth every morning. (Patient not taking: Reported on 03/23/2014) 30 tablet 11   No current facility-administered medications on file prior to visit.    Review of Systems Review of Systems  Constitutional: Negative for fever, appetite change, fatigue and unexpected weight change.  Eyes: Negative for pain and visual disturbance.  Respiratory: Negative for cough and shortness of breath.   Cardiovascular: Negative for cp or palpitations    Gastrointestinal: Negative for nausea, diarrhea and constipation.  Genitourinary: Negative for urgency and frequency.  Skin: Negative for pallor or rash   MSK pos for chronic back pain from spinal stenosis  Neurological: Negative for weakness, light-headedness,  and headaches.pos for LE numbness  Hematological: Negative for adenopathy. Does not bruise/bleed easily.  Psychiatric/Behavioral: Negative for dysphoric mood. The patient is not nervous/anxious.         Objective:   Physical Exam  Constitutional: He appears well-developed and well-nourished. No distress.  obese and well appearing   HENT:  Head: Normocephalic and atraumatic.  Right Ear: External ear normal.  Left Ear: External ear normal.  Nose: Nose normal.  Mouth/Throat: Oropharynx is clear and moist.  Eyes: Conjunctivae and EOM are normal. Pupils are equal, round, and reactive to light. Right eye exhibits no discharge. Left eye exhibits no discharge. No scleral icterus.  Neck: Normal range of motion. Neck supple. No JVD present. Carotid bruit is not present. No thyromegaly present.  Cardiovascular: Normal rate, regular rhythm, normal heart sounds  and intact distal pulses.  Exam reveals no gallop.   Pulmonary/Chest: Effort normal and breath sounds normal. No respiratory distress. He has no wheezes. He exhibits no tenderness.  Abdominal: Soft. Bowel sounds are normal. He exhibits no distension, no abdominal bruit and no mass. There is no tenderness.  Musculoskeletal: He exhibits no edema or tenderness.  Lymphadenopathy:    He has no cervical adenopathy.  Neurological: He is alert. He has normal reflexes. No cranial nerve deficit. He exhibits normal muscle tone. Coordination normal.  Skin: Skin is warm and dry. No rash noted. No erythema. No pallor.  SKs noted   Psychiatric: He has a normal mood and affect.          Assessment & Plan:   Problem List Items Addressed This Visit      Cardiovascular and Mediastinum   Essential hypertension, benign    bp in fair control at this time  BP Readings from Last 1 Encounters:  03/23/14 126/76   No changes needed Disc lifstyle change with low sodium diet and exercise  Will continue lisinopril  Lab today       Relevant Orders   CBC with Differential/Platelet (Completed)   Comprehensive metabolic panel (Completed)   TSH (Completed)   Lipid panel (Completed)     Digestive   Vitamin B 12 deficiency    B12 level today Has polyneuopathy        Relevant Orders   Vitamin B12 (Completed)     Nervous and Auditory   Polyneuropathy - Primary    ? Etiology  Has seen neuro Also foot drop and usually wears AFOs  B12 level today      Relevant Orders   Vitamin B12 (Completed)     Other   Colon cancer screening    Pt declines colon cancer screening today due to age Disc options       Encounter for Medicare annual wellness exam    Reviewed health habits  including diet and exercise and skin cancer prevention Reviewed appropriate screening tests for age  Also reviewed health mt list, fam hx and immunization status , as well as social and family history   See HPI Labs ordered   Given materials to work on Forensic scientist and discussed this in detail  prevnar vaccine today      HYPERCHOLESTEROLEMIA    Disc goals for lipids and reasons to control them Rev labs with pt from last check Lipid panel today Rev low sat fat diet in detail        Relevant Orders   Lipid panel (Completed)   Hyperglycemia    A1C today Per pt -diet is fairly good  Disc imp of low glycemic diet and wt control to prevent DM2      Relevant Orders   Hemoglobin A1c (Completed)    Other Visit Diagnoses    Need for vaccination with 13-polyvalent pneumococcal conjugate vaccine        Relevant Orders    Pneumococcal conjugate vaccine 13-valent (Completed)

## 2014-03-24 ENCOUNTER — Telehealth: Payer: Self-pay | Admitting: Family Medicine

## 2014-03-24 LAB — CBC WITH DIFFERENTIAL/PLATELET
BASOS ABS: 0 10*3/uL (ref 0.0–0.1)
Basophils Relative: 0.5 % (ref 0.0–3.0)
EOS ABS: 0.2 10*3/uL (ref 0.0–0.7)
Eosinophils Relative: 2.1 % (ref 0.0–5.0)
HCT: 41.8 % (ref 39.0–52.0)
Hemoglobin: 14.2 g/dL (ref 13.0–17.0)
Lymphocytes Relative: 23.6 % (ref 12.0–46.0)
Lymphs Abs: 1.7 10*3/uL (ref 0.7–4.0)
MCHC: 34 g/dL (ref 30.0–36.0)
MCV: 84.2 fl (ref 78.0–100.0)
MONO ABS: 0.5 10*3/uL (ref 0.1–1.0)
MONOS PCT: 6.9 % (ref 3.0–12.0)
NEUTROS ABS: 4.9 10*3/uL (ref 1.4–7.7)
NEUTROS PCT: 66.9 % (ref 43.0–77.0)
PLATELETS: 202 10*3/uL (ref 150.0–400.0)
RBC: 4.96 Mil/uL (ref 4.22–5.81)
RDW: 14.7 % (ref 11.5–15.5)
WBC: 7.3 10*3/uL (ref 4.0–10.5)

## 2014-03-24 LAB — HEMOGLOBIN A1C: HEMOGLOBIN A1C: 6.5 % (ref 4.6–6.5)

## 2014-03-24 LAB — COMPREHENSIVE METABOLIC PANEL
ALK PHOS: 79 U/L (ref 39–117)
ALT: 18 U/L (ref 0–53)
AST: 19 U/L (ref 0–37)
Albumin: 4.7 g/dL (ref 3.5–5.2)
BUN: 19 mg/dL (ref 6–23)
CO2: 28 mEq/L (ref 19–32)
CREATININE: 0.85 mg/dL (ref 0.40–1.50)
Calcium: 10.2 mg/dL (ref 8.4–10.5)
Chloride: 101 mEq/L (ref 96–112)
GFR: 92.67 mL/min (ref >60.00)
Glucose, Bld: 95 mg/dL (ref 70–99)
Potassium: 4.2 mEq/L (ref 3.5–5.1)
Sodium: 136 mEq/L (ref 135–145)
Total Bilirubin: 0.5 mg/dL (ref 0.2–1.2)
Total Protein: 7.8 g/dL (ref 6.0–8.3)

## 2014-03-24 LAB — LIPID PANEL
Cholesterol: 219 mg/dL — ABNORMAL HIGH (ref 0–200)
HDL: 47.2 mg/dL (ref >39.00)
NonHDL: 171.8
Total CHOL/HDL Ratio: 5
Triglycerides: 345 mg/dL — ABNORMAL HIGH (ref 0.0–149.0)
VLDL: 69 mg/dL — ABNORMAL HIGH (ref 0.0–40.0)

## 2014-03-24 LAB — VITAMIN B12: Vitamin B-12: 311 pg/mL (ref 211–911)

## 2014-03-24 LAB — LDL CHOLESTEROL, DIRECT: Direct LDL: 134 mg/dL

## 2014-03-24 LAB — TSH: TSH: 1.2 u[IU]/mL (ref 0.35–4.50)

## 2014-03-24 NOTE — Assessment & Plan Note (Signed)
bp in fair control at this time  BP Readings from Last 1 Encounters:  03/23/14 126/76   No changes needed Disc lifstyle change with low sodium diet and exercise  Will continue lisinopril  Lab today

## 2014-03-24 NOTE — Assessment & Plan Note (Signed)
Pt declines colon cancer screening today due to age Disc options

## 2014-03-24 NOTE — Assessment & Plan Note (Signed)
B12 level today Has polyneuopathy

## 2014-03-24 NOTE — Telephone Encounter (Signed)
emmi mailed  °

## 2014-03-24 NOTE — Assessment & Plan Note (Signed)
Reviewed health habits including diet and exercise and skin cancer prevention Reviewed appropriate screening tests for age  Also reviewed health mt list, fam hx and immunization status , as well as social and family history   See HPI Labs ordered  Given materials to work on Forensic scientist and discussed this in detail  prevnar vaccine today

## 2014-03-24 NOTE — Assessment & Plan Note (Signed)
A1C today Per pt -diet is fairly good  Disc imp of low glycemic diet and wt control to prevent DM2

## 2014-03-24 NOTE — Assessment & Plan Note (Signed)
?   Etiology  Has seen neuro Also foot drop and usually wears AFOs  B12 level today

## 2014-03-24 NOTE — Assessment & Plan Note (Signed)
Disc goals for lipids and reasons to control them Rev labs with pt from last check Lipid panel today Rev low sat fat diet in detail

## 2014-04-11 ENCOUNTER — Other Ambulatory Visit: Payer: Self-pay | Admitting: Family Medicine

## 2014-05-05 DIAGNOSIS — M62830 Muscle spasm of back: Secondary | ICD-10-CM | POA: Diagnosis not present

## 2014-05-05 DIAGNOSIS — G609 Hereditary and idiopathic neuropathy, unspecified: Secondary | ICD-10-CM | POA: Diagnosis not present

## 2014-05-05 DIAGNOSIS — M47817 Spondylosis without myelopathy or radiculopathy, lumbosacral region: Secondary | ICD-10-CM | POA: Diagnosis not present

## 2014-05-05 DIAGNOSIS — G894 Chronic pain syndrome: Secondary | ICD-10-CM | POA: Diagnosis not present

## 2014-05-10 ENCOUNTER — Telehealth: Payer: Self-pay | Admitting: *Deleted

## 2014-05-10 NOTE — Telephone Encounter (Signed)
Left voicemail requesting wife to call office back

## 2014-05-10 NOTE — Telephone Encounter (Signed)
We need to see him in the office and examine him as well

## 2014-05-10 NOTE — Telephone Encounter (Signed)
Pt's wife called and said pt has been really constipated and after he goes to the bathroom she can "smell the blood" in his stool. Pt's wife couldn't tell me if he is having dark black stool or if he has bright red blood in his stool she said she can just "smell the blood" after he has a BM, I offered wife to schedule an appt but she wanted to see if Dr. Glori Bickers is okay with her getting a stool card for him 1st to see if he has blood in his stool instead of making an appt., please advise

## 2014-05-12 ENCOUNTER — Other Ambulatory Visit: Payer: Self-pay | Admitting: Family Medicine

## 2014-05-12 NOTE — Telephone Encounter (Signed)
appt scheduled

## 2014-05-16 ENCOUNTER — Ambulatory Visit (INDEPENDENT_AMBULATORY_CARE_PROVIDER_SITE_OTHER): Payer: Medicare Other | Admitting: Family Medicine

## 2014-05-16 ENCOUNTER — Encounter: Payer: Self-pay | Admitting: Radiology

## 2014-05-16 ENCOUNTER — Encounter: Payer: Self-pay | Admitting: Family Medicine

## 2014-05-16 VITALS — BP 140/76 | HR 86 | Temp 98.0°F | Ht 68.0 in | Wt 219.2 lb

## 2014-05-16 DIAGNOSIS — Z79899 Other long term (current) drug therapy: Secondary | ICD-10-CM | POA: Diagnosis not present

## 2014-05-16 DIAGNOSIS — K5900 Constipation, unspecified: Secondary | ICD-10-CM | POA: Insufficient documentation

## 2014-05-16 DIAGNOSIS — K625 Hemorrhage of anus and rectum: Secondary | ICD-10-CM

## 2014-05-16 NOTE — Progress Notes (Signed)
Subjective:    Patient ID: Scott Gomez, male    DOB: 05-27-1934, 80 y.o.   MRN: [id number]  HPI Here with constipation and also small amt of blood in stool  (brb on paper - not much) Has to occ disimpact himself with a glove  BM usually ever other day   Took some liquid over the counter last week- (mag sulfate?)   Has never used stool softener or mirilax Drinks plenty of water   No abdominal pain   colonosc 2000  IFOB cards 1/15 - came back pos and he refused a colonoscopy   Patient Active Problem List   Diagnosis Date Noted  . Polyneuropathy 03/23/2014  . Encounter for Medicare annual wellness exam 11/25/2012  . Colon cancer screening 11/25/2012  . Spinal stenosis 08/27/2011  . Vitamin B 12 deficiency 08/06/2011  . Neuropathy 07/23/2011  . Obesity 05/13/2011  . Falls frequently 05/01/2011  . Prostate cancer screening 11/07/2010  . DEPRESSION 02/26/2010  . Essential hypertension, benign 03/29/2008  . C O P D 07/08/2007  . OBSTRUCTIVE SLEEP APNEA 07/08/2007  . TESTOSTERONE DEFICIENCY 06/22/2007  . Hyperglycemia 01/07/2007  . HYPERCHOLESTEROLEMIA 01/07/2007  . ANXIETY 01/07/2007  . CARPAL TUNNEL SYNDROME 01/07/2007  . PEPTIC ULCER DISEASE 01/07/2007  . OSTEOARTHRITIS, HANDS, BILATERAL 01/07/2007  . SHOULDER PAIN, LEFT, CHRONIC 01/07/2007  . PALPITATIONS, HX OF 01/07/2007  . NEPHROLITHIASIS, HX OF 01/07/2007   Past Medical History  Diagnosis Date  . Testosterone deficiency   . Snoring   . Fatigue   . Hypopotassemia   . Leg cramps   . Carpal tunnel syndrome   . Arthritis     osteoarthritis both hands  . Palpitations     Hx of   . Shoulder pain, left   . Hypercholesteremia   . Ulcer     peptic ulcer disease  . Nephrolithiasis     Hx of  . Diabetes mellitus     type II  . Anxiety    Past Surgical History  Procedure Laterality Date  . Back surgery  1978    x2 disc   History  Substance Use Topics  . Smoking status: Former Smoker    Quit date:  01/14/1993  . Smokeless tobacco: Never Used  . Alcohol Use: No   Family History  Problem Relation Age of Onset  . Hypertension Mother   . Hypertension Father   . Cancer Sister   . Cancer Brother     kidney  . Heart disease Brother     MI  . Emphysema Sister   . Allergies Mother   . Allergies Father   . Heart attack Sister    Allergies  Allergen Reactions  . Aspirin     REACTION: GI upset  . Buspirone Hcl     REACTION: itching  . Crestor [Rosuvastatin Calcium]     Increased his blood sugar   . Prednisone     REACTION: sick, per wife he can take this.   Current Outpatient Prescriptions on File Prior to Visit  Medication Sig Dispense Refill  . alendronate (FOSAMAX) 70 MG tablet Take 70 mg by mouth once a week. Take with a full glass of water on an empty stomach.    . ALPRAZolam (XANAX) 0.5 MG tablet TAKE ONE (1) TABLET BY MOUTH TWO (2) TIMES DAILY AS NEEDED FOR ANXIETY 60 tablet 3  . B Complex Vitamins (VITAMIN B COMPLEX PO) Take 1 tablet by mouth daily.    . bisoprolol (ZEBETA) 5  MG tablet TAKE ONE (1) TABLET BY MOUTH EVERY DAY 30 tablet 5  . Blood Glucose Monitoring Suppl (ONETOUCH VERIO IQ SYSTEM) W/DEVICE KIT Use to test blood sugar once daily and as needed dx 790.29 1 kit 0  . cyclobenzaprine (FLEXERIL) 10 MG tablet Take 10 mg by mouth 3 (three) times daily as needed. For muscle spasms     . gabapentin (NEURONTIN) 100 MG capsule Take 100 mg by mouth. Take 2 capsules three times a day by mouth.    Marland Kitchen lisinopril (PRINIVIL,ZESTRIL) 5 MG tablet TAKE ONE (1) TABLET BY MOUTH EVERY DAY 90 tablet 3  . meloxicam (MOBIC) 7.5 MG tablet Take 7.5 mg by mouth daily.    . Misc Natural Products (GREEN TEA SLIM) TABS Take 1 tablet by mouth at bedtime.      . ONE TOUCH LANCETS MISC Use to test blood sugar once daily and as needed. Dx 790.29 200 each 1  . ONETOUCH VERIO test strip CHECK GLUCOSE EVERY DAY AND AS NEEDED 100 each 1  . Oxycodone HCl 10 MG TABS Take 1 tablet by mouth every 6  (six) hours as needed.    . potassium chloride SA (K-DUR,KLOR-CON) 20 MEQ tablet TAKE ONE (1) TABLET BY MOUTH EVERY DAY 90 tablet 3  . travoprost, benzalkonium, (TRAVATAN) 0.004 % ophthalmic solution Place 1 drop into both eyes at bedtime.       No current facility-administered medications on file prior to visit.        Review of Systems    Review of Systems  Constitutional: Negative for fever, appetite change, fatigue and unexpected weight change.  Eyes: Negative for pain and visual disturbance.  Respiratory: Negative for cough and shortness of breath.   Cardiovascular: Negative for cp or palpitations    Gastrointestinal: Negative for nausea, diarrhea and pos for constipation, neg for abdominal pain or dark stools   Genitourinary: Negative for urgency and frequency.  Skin: Negative for pallor or rash   Neurological: Negative for weakness, light-headedness, numbness and headaches.  Hematological: Negative for adenopathy. Does not bruise/bleed easily.  Psychiatric/Behavioral: Negative for dysphoric mood. The patient is not nervous/anxious.      Objective:   Physical Exam  Constitutional: He appears well-developed and well-nourished. No distress.  obese and well appearing   HENT:  Head: Normocephalic and atraumatic.  Eyes: Conjunctivae and EOM are normal. Pupils are equal, round, and reactive to light. No scleral icterus.  Neck: Normal range of motion. Neck supple.  Cardiovascular: Normal rate and regular rhythm.   Abdominal: Soft. Bowel sounds are normal. He exhibits no distension and no mass. There is no tenderness. There is no rebound and no guarding.  Genitourinary: Rectum normal. Guaiac negative stool.  No rectal discomfort   Musculoskeletal: He exhibits no edema.  Lymphadenopathy:    He has no cervical adenopathy.  Neurological: He is alert.  Skin: Skin is warm and dry. No rash noted. No pallor.  Psychiatric: He has a normal mood and affect.          Assessment &  Plan:   Problem List Items Addressed This Visit      Digestive   Constipation - Primary    Suspect from chronic opiod pain med  Will try miralax daily  Add stool softener if needed  Enc water/fiber intake  Heme neg stool today Declines colonoscopy      Rectal bleeding    Suspect this is from int hemorrhoids Nl rectal exam Heme neg stool today  Disc  tx of constipation/ avoid straining and given handout on both  If not imp can try tx  He wants to avoid colonoscopy

## 2014-05-16 NOTE — Assessment & Plan Note (Signed)
Suspect this is from int hemorrhoids Nl rectal exam Heme neg stool today  Disc tx of constipation/ avoid straining and given handout on both  If not imp can try tx  He wants to avoid colonoscopy

## 2014-05-16 NOTE — Assessment & Plan Note (Signed)
Suspect from chronic opiod pain med  Will try miralax daily  Add stool softener if needed  Enc water/fiber intake  Heme neg stool today Declines colonoscopy

## 2014-05-16 NOTE — Progress Notes (Signed)
Pre visit review using our clinic review tool, if applicable. No additional management support is needed unless otherwise documented below in the visit note. 

## 2014-05-16 NOTE — Patient Instructions (Signed)
For constipation- buy miralax over the counter - and take it mixed with water once daily  If that does not help buy itself add a stool softener like colace over the counter twice daily Drink lots of fluids Also eat fiber (fruit and vegetables) I think you have hemorrhoids that act up when you strain to have a bowel movement -so controlling constipation should control that  However - if bleeding comes back please let me know

## 2014-05-20 DIAGNOSIS — M47817 Spondylosis without myelopathy or radiculopathy, lumbosacral region: Secondary | ICD-10-CM | POA: Diagnosis not present

## 2014-05-26 DIAGNOSIS — H4011X2 Primary open-angle glaucoma, moderate stage: Secondary | ICD-10-CM | POA: Diagnosis not present

## 2014-05-26 DIAGNOSIS — H26493 Other secondary cataract, bilateral: Secondary | ICD-10-CM | POA: Diagnosis not present

## 2014-06-09 DIAGNOSIS — G894 Chronic pain syndrome: Secondary | ICD-10-CM | POA: Diagnosis not present

## 2014-06-09 DIAGNOSIS — M47817 Spondylosis without myelopathy or radiculopathy, lumbosacral region: Secondary | ICD-10-CM | POA: Diagnosis not present

## 2014-06-09 DIAGNOSIS — G609 Hereditary and idiopathic neuropathy, unspecified: Secondary | ICD-10-CM | POA: Diagnosis not present

## 2014-06-09 DIAGNOSIS — M62830 Muscle spasm of back: Secondary | ICD-10-CM | POA: Diagnosis not present

## 2014-06-15 ENCOUNTER — Encounter: Payer: Self-pay | Admitting: Family Medicine

## 2014-06-30 DIAGNOSIS — Z79891 Long term (current) use of opiate analgesic: Secondary | ICD-10-CM | POA: Diagnosis not present

## 2014-06-30 DIAGNOSIS — G894 Chronic pain syndrome: Secondary | ICD-10-CM | POA: Diagnosis not present

## 2014-06-30 DIAGNOSIS — M62830 Muscle spasm of back: Secondary | ICD-10-CM | POA: Diagnosis not present

## 2014-06-30 DIAGNOSIS — G609 Hereditary and idiopathic neuropathy, unspecified: Secondary | ICD-10-CM | POA: Diagnosis not present

## 2014-06-30 DIAGNOSIS — M47817 Spondylosis without myelopathy or radiculopathy, lumbosacral region: Secondary | ICD-10-CM | POA: Diagnosis not present

## 2014-07-04 ENCOUNTER — Other Ambulatory Visit: Payer: Self-pay | Admitting: Family Medicine

## 2014-07-04 NOTE — Telephone Encounter (Signed)
Electronic refill request, pt had CPE on 03/23/14 and last refilled 03/11/14 #60 with 3 additional refills

## 2014-07-04 NOTE — Telephone Encounter (Signed)
Px written for call in   

## 2014-07-05 NOTE — Telephone Encounter (Signed)
Rx called in as prescribed 

## 2014-07-29 ENCOUNTER — Encounter: Payer: Self-pay | Admitting: Pulmonary Disease

## 2014-07-29 ENCOUNTER — Ambulatory Visit (INDEPENDENT_AMBULATORY_CARE_PROVIDER_SITE_OTHER): Payer: Medicare Other | Admitting: Pulmonary Disease

## 2014-07-29 VITALS — BP 130/86 | HR 57 | Ht 69.0 in | Wt 221.8 lb

## 2014-07-29 DIAGNOSIS — G4730 Sleep apnea, unspecified: Secondary | ICD-10-CM

## 2014-07-29 DIAGNOSIS — J410 Simple chronic bronchitis: Secondary | ICD-10-CM | POA: Diagnosis not present

## 2014-07-29 NOTE — Progress Notes (Signed)
   Subjective:    Patient ID: Scott Gomez, male    DOB: 1934/12/24, 80 y.o.   MRN: [id number]  HPI 80 year old man for follow-up of OSA and chronic bronchitis First seen 06/2007 for excessive daytime somnolence & loud snoring.  Lost to FU x 69yr, presented again 09/2012 - provided with new CPAP  Smoked 2 PPD until 1995, worked in a [employer] 25 yrs.  Every winter catches a cold that is hard ot shake off.    14-Nov-202016  Chief Complaint  Patient presents with  . Sleep Apnea    still snoring with CPAP machine.  Patient says he doesn't notice a difference on CPAP.  wants RX for different mask, wants to try nasal, not full face. no concerns.   Wife sleeps in diff room due to snoring Snoring on CPAP 8 Bedtime 11p, latency 30 mins, wakes x twice, no post void sleep latency, oob by 0900, feels rested, no dry mouth or headaches. No daytime naps, wife has come back into bedroom, she can tell if he does not use his cpap  Download 07/2014 good usage , no residuals, no leak   he has no intercurrent episodes of bronchitis  Significant tests/ events  PSG 07/2007 >> mod OSA , AHI 15.2/h with PLMs obliterated by CPAP 8, med FF mask  Spirometry 6/09>>FEV1 78% (surprisingly good), smaller airways affected 48%   Review of Systems neg for any significant sore throat, dysphagia, itching, sneezing, nasal congestion or excess/ purulent secretions, fever, chills, sweats, unintended wt loss, pleuritic or exertional cp, hempoptysis, orthopnea pnd or change in chronic leg swelling. Also denies presyncope, palpitations, heartburn, abdominal pain, nausea, vomiting, diarrhea or change in bowel or urinary habits, dysuria,hematuria, rash, arthralgias, visual complaints, headache, numbness weakness or ataxia.      Objective:   Physical Exam  Gen. Pleasant, obese, in no distress ENT - no lesions, no post nasal drip Neck: No JVD, no thyromegaly, no carotid bruits Lungs: no use of accessory muscles, no  dullness to percussion, decreased without rales or rhonchi  Cardiovascular: Rhythm regular, heart sounds  normal, no murmurs or gallops, no peripheral edema Musculoskeletal: No deformities, no cyanosis or clubbing , no tremors      Assessment & Plan:

## 2014-07-29 NOTE — Assessment & Plan Note (Addendum)
Rx for nasal mask will be sent Increase CPAP pressure to 10 cm to abolish snoring  Weight loss encouraged, compliance with goal of at least 4-6 hrs every night is the expectation. Advised against medications with sedative side effects Cautioned against driving when sleepy - understanding that sleepiness will vary on a day to day basis

## 2014-07-29 NOTE — Patient Instructions (Signed)
Rx for nasal mask will be sent Increase CPAP pressure to 10 cm

## 2014-07-30 NOTE — Assessment & Plan Note (Signed)
Appears stable by symptoms No medications required

## 2014-08-04 ENCOUNTER — Encounter: Payer: Self-pay | Admitting: Pulmonary Disease

## 2014-08-11 DIAGNOSIS — G4733 Obstructive sleep apnea (adult) (pediatric): Secondary | ICD-10-CM | POA: Diagnosis not present

## 2014-08-23 DIAGNOSIS — M62830 Muscle spasm of back: Secondary | ICD-10-CM | POA: Diagnosis not present

## 2014-08-23 DIAGNOSIS — G894 Chronic pain syndrome: Secondary | ICD-10-CM | POA: Diagnosis not present

## 2014-08-23 DIAGNOSIS — M47817 Spondylosis without myelopathy or radiculopathy, lumbosacral region: Secondary | ICD-10-CM | POA: Diagnosis not present

## 2014-08-23 DIAGNOSIS — G609 Hereditary and idiopathic neuropathy, unspecified: Secondary | ICD-10-CM | POA: Diagnosis not present

## 2014-11-09 ENCOUNTER — Other Ambulatory Visit: Payer: Self-pay | Admitting: Family Medicine

## 2014-11-09 NOTE — Telephone Encounter (Signed)
Electronic refill request, last refilled on 07/04/14 #60 with 3 additional refills, pt had CPE on 03/23/14 and OV on 05/16/14

## 2014-11-09 NOTE — Telephone Encounter (Signed)
Px written for call in   

## 2014-11-10 NOTE — Telephone Encounter (Signed)
Rx called in as prescribed 

## 2014-11-23 DIAGNOSIS — M7062 Trochanteric bursitis, left hip: Secondary | ICD-10-CM | POA: Diagnosis not present

## 2014-11-23 DIAGNOSIS — G609 Hereditary and idiopathic neuropathy, unspecified: Secondary | ICD-10-CM | POA: Diagnosis not present

## 2014-11-23 DIAGNOSIS — M47817 Spondylosis without myelopathy or radiculopathy, lumbosacral region: Secondary | ICD-10-CM | POA: Diagnosis not present

## 2014-11-23 DIAGNOSIS — M62830 Muscle spasm of back: Secondary | ICD-10-CM | POA: Diagnosis not present

## 2014-11-23 DIAGNOSIS — G894 Chronic pain syndrome: Secondary | ICD-10-CM | POA: Diagnosis not present

## 2014-12-01 DIAGNOSIS — H401132 Primary open-angle glaucoma, bilateral, moderate stage: Secondary | ICD-10-CM | POA: Diagnosis not present

## 2015-02-23 ENCOUNTER — Ambulatory Visit: Payer: Medicare Other | Admitting: Family Medicine

## 2015-02-24 ENCOUNTER — Encounter: Payer: Self-pay | Admitting: Family Medicine

## 2015-02-24 ENCOUNTER — Ambulatory Visit (INDEPENDENT_AMBULATORY_CARE_PROVIDER_SITE_OTHER): Payer: Medicare Other | Admitting: Family Medicine

## 2015-02-24 VITALS — BP 128/64 | HR 72 | Temp 98.6°F | Ht 69.0 in | Wt 228.5 lb

## 2015-02-24 DIAGNOSIS — R059 Cough, unspecified: Secondary | ICD-10-CM | POA: Insufficient documentation

## 2015-02-24 DIAGNOSIS — R05 Cough: Secondary | ICD-10-CM

## 2015-02-24 MED ORDER — AZITHROMYCIN 250 MG PO TABS
ORAL_TABLET | ORAL | Status: DC
Start: 2015-02-24 — End: 2015-04-07

## 2015-02-24 NOTE — Progress Notes (Signed)
Pre visit review using our clinic review tool, if applicable. No additional management support is needed unless otherwise documented below in the visit note. 

## 2015-02-24 NOTE — Patient Instructions (Addendum)
Start Mucinex DM twice daily.  Can consider Claritin daily for allergies.  Start tylenol for headache as needed.  Start nasal saline spray 2-3 times daily.  If not turing corner in 3-4 days.. Fill antibiotics. If severe shortness of breath go to ER.

## 2015-02-24 NOTE — Progress Notes (Signed)
   Subjective:    Patient ID: Scott Gomez, male    DOB: 1934/04/26, 81 y.o.   MRN: [id number]  Cough This is a new problem. The current episode started in the past 7 days. The problem has been gradually worsening. The cough is productive of sputum. Associated symptoms include headaches, nasal congestion and shortness of breath. Pertinent negatives include no chills, ear congestion, ear pain, fever, myalgias, sore throat or wheezing. Associated symptoms comments: Eyes watering, runny nose  eyes heavy. Risk factors for lung disease include smoking/tobacco exposure (former smoker remotely, > 20 years). He has tried nothing for the symptoms. There is no history of asthma, COPD, environmental allergies or pneumonia.   Wife sick as well. Social History /Family History/Past Medical History reviewed and updated if needed.   Review of Systems  Constitutional: Negative for fever and chills.  HENT: Negative for ear pain and sore throat.   Respiratory: Positive for cough and shortness of breath. Negative for wheezing.   Musculoskeletal: Negative for myalgias.  Allergic/Immunologic: Negative for environmental allergies.  Neurological: Positive for headaches.       Objective:   Physical Exam  Constitutional: Vital signs are normal. He appears well-developed and well-nourished.  Non-toxic appearance. He does not appear ill. No distress.  HENT:  Head: Normocephalic and atraumatic.  Right Ear: Hearing, tympanic membrane, external ear and ear canal normal. No tenderness. No foreign bodies. Tympanic membrane is not retracted and not bulging.  Left Ear: Hearing, tympanic membrane, external ear and ear canal normal. No tenderness. No foreign bodies. Tympanic membrane is not retracted and not bulging.  Nose: Mucosal edema and rhinorrhea present. Right sinus exhibits no maxillary sinus tenderness and no frontal sinus tenderness. Left sinus exhibits no maxillary sinus tenderness and no frontal sinus  tenderness.  Mouth/Throat: Uvula is midline, oropharynx is clear and moist and mucous membranes are normal. Normal dentition. No dental caries. No oropharyngeal exudate or tonsillar abscesses.  Eyes: Conjunctivae, EOM and lids are normal. Pupils are equal, round, and reactive to light. Lids are everted and swept, no foreign bodies found.  Neck: Trachea normal, normal range of motion and phonation normal. Neck supple. Carotid bruit is not present. No thyroid mass and no thyromegaly present.  Cardiovascular: Normal rate, regular rhythm, S1 normal, S2 normal, normal heart sounds, intact distal pulses and normal pulses.  Exam reveals no gallop.   No murmur heard. Pulmonary/Chest: Effort normal and breath sounds normal. No respiratory distress. He has no wheezes. He has no rhonchi. He has no rales.  Abdominal: Soft. Normal appearance and bowel sounds are normal. There is no hepatosplenomegaly. There is no tenderness. There is no rebound, no guarding and no CVA tenderness. No hernia.  Neurological: He is alert. He has normal reflexes.  Skin: Skin is warm, dry and intact. No rash noted.  Psychiatric: He has a normal mood and affect. His speech is normal and behavior is normal. Judgment normal.          Assessment & Plan:

## 2015-02-24 NOTE — Assessment & Plan Note (Signed)
Likely viral URI. Symptomatic care but if not improving as expected fill rx for antibitoics given smoking history.

## 2015-03-11 ENCOUNTER — Other Ambulatory Visit: Payer: Self-pay | Admitting: Family Medicine

## 2015-03-13 NOTE — Telephone Encounter (Signed)
Electronic refill request, pt has had a recent acute appt with Dr. Diona Browner but no recent/future appt with Dr. Glori Bickers, last refilled on 11/09/14 #60 with 3 additional refills, please advise

## 2015-03-13 NOTE — Telephone Encounter (Signed)
Px written for call in   Please set up for [facility] visit if he wants in the summer with Leisa and then with me for a physical exam in late summer/early fall

## 2015-03-14 NOTE — Telephone Encounter (Signed)
Rx called in as prescribed, message sent to [facility] to schedule appt

## 2015-03-17 ENCOUNTER — Ambulatory Visit (INDEPENDENT_AMBULATORY_CARE_PROVIDER_SITE_OTHER): Payer: Medicare Other | Admitting: *Deleted

## 2015-03-17 DIAGNOSIS — Z23 Encounter for immunization: Secondary | ICD-10-CM | POA: Diagnosis not present

## 2015-03-24 ENCOUNTER — Ambulatory Visit (INDEPENDENT_AMBULATORY_CARE_PROVIDER_SITE_OTHER): Payer: Medicare Other

## 2015-03-24 VITALS — BP 120/72 | HR 76 | Temp 98.4°F | Ht 69.0 in | Wt 212.5 lb

## 2015-03-24 DIAGNOSIS — Z0000 Encounter for general adult medical examination without abnormal findings: Secondary | ICD-10-CM | POA: Diagnosis not present

## 2015-03-24 DIAGNOSIS — E7800 Pure hypercholesterolemia, unspecified: Secondary | ICD-10-CM | POA: Diagnosis not present

## 2015-03-24 DIAGNOSIS — R739 Hyperglycemia, unspecified: Secondary | ICD-10-CM

## 2015-03-24 DIAGNOSIS — E538 Deficiency of other specified B group vitamins: Secondary | ICD-10-CM

## 2015-03-24 LAB — CBC WITH DIFFERENTIAL/PLATELET
BASOS ABS: 0 10*3/uL (ref 0.0–0.1)
Basophils Relative: 0.6 % (ref 0.0–3.0)
Eosinophils Absolute: 0.1 10*3/uL (ref 0.0–0.7)
Eosinophils Relative: 2.2 % (ref 0.0–5.0)
HEMATOCRIT: 40.1 % (ref 39.0–52.0)
Hemoglobin: 13.7 g/dL (ref 13.0–17.0)
LYMPHS ABS: 1.7 10*3/uL (ref 0.7–4.0)
LYMPHS PCT: 27.1 % (ref 12.0–46.0)
MCHC: 34.2 g/dL (ref 30.0–36.0)
MCV: 82 fl (ref 78.0–100.0)
MONOS PCT: 8.4 % (ref 3.0–12.0)
Monocytes Absolute: 0.5 10*3/uL (ref 0.1–1.0)
Neutro Abs: 4 10*3/uL (ref 1.4–7.7)
Neutrophils Relative %: 61.7 % (ref 43.0–77.0)
Platelets: 203 10*3/uL (ref 150.0–400.0)
RBC: 4.88 Mil/uL (ref 4.22–5.81)
RDW: 15.1 % (ref 11.5–15.5)
WBC: 6.4 10*3/uL (ref 4.0–10.5)

## 2015-03-24 LAB — HEMOGLOBIN A1C: HEMOGLOBIN A1C: 6.4 % (ref 4.6–6.5)

## 2015-03-24 LAB — COMPREHENSIVE METABOLIC PANEL
ALBUMIN: 4.6 g/dL (ref 3.5–5.2)
ALK PHOS: 58 U/L (ref 39–117)
ALT: 15 U/L (ref 0–53)
AST: 21 U/L (ref 0–37)
BUN: 14 mg/dL (ref 6–23)
CO2: 28 mEq/L (ref 19–32)
Calcium: 9.6 mg/dL (ref 8.4–10.5)
Chloride: 102 mEq/L (ref 96–112)
Creatinine, Ser: 0.88 mg/dL (ref 0.40–1.50)
GFR: 88.8 mL/min (ref >60.00)
Glucose, Bld: 116 mg/dL — ABNORMAL HIGH (ref 70–99)
POTASSIUM: 4.4 meq/L (ref 3.5–5.1)
Sodium: 138 mEq/L (ref 135–145)
TOTAL PROTEIN: 7.5 g/dL (ref 6.0–8.3)
Total Bilirubin: 0.7 mg/dL (ref 0.2–1.2)

## 2015-03-24 LAB — VITAMIN B12: VITAMIN B 12: 354 pg/mL (ref 211–911)

## 2015-03-24 LAB — LIPID PANEL
CHOLESTEROL: 188 mg/dL (ref 0–200)
HDL: 41.9 mg/dL (ref >39.00)
LDL CALC: 118 mg/dL — AB (ref 0–99)
NonHDL: 145.68
TRIGLYCERIDES: 137 mg/dL (ref 0.0–149.0)
Total CHOL/HDL Ratio: 4
VLDL: 27.4 mg/dL (ref 0.0–40.0)

## 2015-03-24 LAB — LDL CHOLESTEROL, DIRECT: LDL DIRECT: 122 mg/dL

## 2015-03-24 LAB — TSH: TSH: 0.56 u[IU]/mL (ref 0.35–4.50)

## 2015-03-24 NOTE — Patient Instructions (Signed)
Scott Gomez , Thank you for taking time to come for your Medicare Wellness Visit. I appreciate your ongoing commitment to your health goals. Please review the following plan we discussed and let me know if I can assist you in the future.   These are the goals we discussed: Goals    . Increase physical activity     Starting 03/24/2015, I will continue to yard work for at least 2 days a week.        This is a list of the screening recommended for you and due dates:  Health Maintenance  Topic Date Due  . Shingles Vaccine  03/23/2016*  . Flu Shot  08/15/2015  . Tetanus Vaccine  08/22/2019  . Pneumonia vaccines  Completed  *Topic was postponed. The date shown is not the original due date.   Preventive Care for Adults  A healthy lifestyle and preventive care can promote health and wellness. Preventive health guidelines for adults include the following key practices.  . A routine yearly physical is a good way to check with your health care provider about your health and preventive screening. It is a chance to share any concerns and updates on your health and to receive a thorough exam.  . Visit your dentist for a routine exam and preventive care every 6 months. Brush your teeth twice a day and floss once a day. Good oral hygiene prevents tooth decay and gum disease.  . The frequency of eye exams is based on your age, health, family medical history, use  of contact lenses, and other factors. Follow your health care provider's ecommendations for frequency of eye exams.  . Eat a healthy diet. Foods like vegetables, fruits, whole grains, low-fat dairy products, and lean protein foods contain the nutrients you need without too many calories. Decrease your intake of foods high in solid fats, added sugars, and salt. Eat the right amount of calories for you. Get information about a proper diet from your health care provider, if necessary.  . Regular physical exercise is one of the most important  things you can do for your health. Most adults should get at least 150 minutes of moderate-intensity exercise (any activity that increases your heart rate and causes you to sweat) each week. In addition, most adults need muscle-strengthening exercises on 2 or more days a week.  . Maintain a healthy weight. The body mass index (BMI) is a screening tool to identify possible weight problems. It provides an estimate of body fat based on height and weight. Your health care provider can find your BMI and can help you achieve or maintain a healthy weight.   For adults 20 years and older: ? A BMI below 18.5 is considered underweight. ? A BMI of 18.5 to 24.9 is normal. ? A BMI of 25 to 29.9 is considered overweight. ? A BMI of 30 and above is considered obese.   . Maintain normal blood lipids and cholesterol levels by exercising and minimizing your intake of saturated fat. Eat a balanced diet with plenty of fruit and vegetables. Blood tests for lipids and cholesterol should begin at age 23 and be repeated every 5 years. If your lipid or cholesterol levels are high, you are over 50, or you are at high risk for heart disease, you may need your cholesterol levels checked more frequently. Ongoing high lipid and cholesterol levels should be treated with medicines if diet and exercise are not working.  . If you smoke, find out from your  health care provider how to quit. If you do not use tobacco, please do not start.  . If you choose to drink alcohol, please do not consume more than 2 drinks per day. One drink is considered to be 12 ounces (355 mL) of beer, 5 ounces (148 mL) of wine, or 1.5 ounces (44 mL) of liquor.  . If you are 36-67 years old, ask your health care provider if you should take aspirin to prevent strokes.  . Use sunscreen. Apply sunscreen liberally and repeatedly throughout the day. You should seek shade when your shadow is shorter than you. Protect yourself by wearing long sleeves, pants, a  wide-brimmed hat, and sunglasses year round, whenever you are outdoors.  . Once a month, do a whole body skin exam, using a mirror to look at the skin on your back. Tell your health care provider of new moles, moles that have irregular borders, moles that are larger than a pencil eraser, or moles that have changed in shape or color.

## 2015-03-28 ENCOUNTER — Encounter: Payer: Self-pay | Admitting: *Deleted

## 2015-04-07 ENCOUNTER — Encounter: Payer: Self-pay | Admitting: Family Medicine

## 2015-04-07 ENCOUNTER — Ambulatory Visit (INDEPENDENT_AMBULATORY_CARE_PROVIDER_SITE_OTHER): Payer: Medicare Other | Admitting: Family Medicine

## 2015-04-07 ENCOUNTER — Encounter: Payer: Self-pay | Admitting: Radiology

## 2015-04-07 VITALS — BP 116/78 | HR 74 | Temp 98.2°F | Ht 69.0 in | Wt 212.0 lb

## 2015-04-07 DIAGNOSIS — M810 Age-related osteoporosis without current pathological fracture: Secondary | ICD-10-CM

## 2015-04-07 DIAGNOSIS — E7800 Pure hypercholesterolemia, unspecified: Secondary | ICD-10-CM | POA: Diagnosis not present

## 2015-04-07 DIAGNOSIS — Z0000 Encounter for general adult medical examination without abnormal findings: Secondary | ICD-10-CM

## 2015-04-07 DIAGNOSIS — E538 Deficiency of other specified B group vitamins: Secondary | ICD-10-CM

## 2015-04-07 DIAGNOSIS — R739 Hyperglycemia, unspecified: Secondary | ICD-10-CM

## 2015-04-07 DIAGNOSIS — E669 Obesity, unspecified: Secondary | ICD-10-CM

## 2015-04-07 DIAGNOSIS — I10 Essential (primary) hypertension: Secondary | ICD-10-CM

## 2015-04-07 MED ORDER — POTASSIUM CHLORIDE CRYS ER 20 MEQ PO TBCR: EXTENDED_RELEASE_TABLET | ORAL | Status: DC

## 2015-04-07 MED ORDER — LISINOPRIL 5 MG PO TABS: ORAL_TABLET | ORAL | Status: DC

## 2015-04-07 NOTE — Progress Notes (Signed)
Pre visit review using our clinic review tool, if applicable. No additional management support is needed unless otherwise documented below in the visit note. 

## 2015-04-07 NOTE — Progress Notes (Signed)
Subjective:    Patient ID: Scott Gomez, male    DOB: 06-28-1934, 81 y.o.   MRN: [id number]  HPI Here for health maintenance exam and to review chronic medical problems    Had AMW visit with Lesia-this was reviewed  Poor hearing - on his screen- he has not noticed - he does not want eval or treatment  Vision- has glaucoma- vision is ok per pt -not affecting her   Wt is stable with bmi of 31- limited exercise / spinal stenosis and leg/foot problems  Can stand for short periods of time  Still takes gabapentin for neuropathy  Does more outside in good weather  Obese  No falls (though legs are weak)  No symptoms of depression  Has anxiety-takes xanax  IFOB pos 2015- pt declines further f/u Declines colon cancer screening  Had polyps  On colonosc 2000 Lab Results  Component Value Date   WBC 6.4 03/24/2015   HGB 13.7 03/24/2015   HCT 40.1 03/24/2015   MCV 82.0 03/24/2015   PLT 203.0 03/24/2015    Hyperglycemia Lab Results  Component Value Date   HGBA1C 6.4 03/24/2015   This is down from 6.5 Glucose at home has been 90s-low 100s -doing well  miralax made it go up   Has hx of neuropathy and foot drop Spinal stenosis  B12 def Lab Results  Component Value Date   VITAMINB12 354 03/24/2015   Stable  bp is stable today  No cp or palpitations or headaches or edema  No side effects to medicines  BP Readings from Last 3 Encounters:  04/07/15 116/78  03/24/15 120/72  02/24/15 128/64     Copd-no changes, does not keep him from doing anything  Uses cpap every night - even though he hates it   Cholesterol Lab Results  Component Value Date   CHOL 188 03/24/2015   CHOL 219* 03/23/2014   CHOL 224* 11/25/2012   Lab Results  Component Value Date   HDL 41.90 03/24/2015   HDL 47.20 03/23/2014   HDL 45.00 11/25/2012   Lab Results  Component Value Date   LDLCALC 118* 03/24/2015   LDLCALC 113* 05/06/2011   LDLCALC 95 01/02/2011   Lab Results  Component  Value Date   TRIG 137.0 03/24/2015   TRIG 345.0* 03/23/2014   TRIG 230.0* 11/25/2012   Lab Results  Component Value Date   CHOLHDL 4 03/24/2015   CHOLHDL 5 03/23/2014   CHOLHDL 5 11/25/2012   Lab Results  Component Value Date   LDLDIRECT 122.0 03/24/2015   LDLDIRECT 134.0 03/23/2014   LDLDIRECT 149.9 11/25/2012    Triglycerides are improved  Eating healthy overall- better than he was/ less fat     Chemistry      Component Value Date/Time   NA 138 03/24/2015 1513   K 4.4 03/24/2015 1513   CL 102 03/24/2015 1513   CO2 28 03/24/2015 1513   BUN 14 03/24/2015 1513   CREATININE 0.88 03/24/2015 1513      Component Value Date/Time   CALCIUM 9.6 03/24/2015 1513   ALKPHOS 58 03/24/2015 1513   AST 21 03/24/2015 1513   ALT 15 03/24/2015 1513   BILITOT 0.7 03/24/2015 1513      Lab Results  Component Value Date   TSH 0.56 03/24/2015     Has OP  Followed by pain management  Has had comp fx On [facility]osamax [facility]-they follow this   Patient Active Problem List   Diagnosis Date  Noted  . Osteoporosis 04/07/2015  . Cough 02/24/2015  . Constipation 05/16/2014  . Rectal bleeding 05/16/2014  . Polyneuropathy ([place]) 03/23/2014  . Encounter for Medicare annual wellness exam 11/25/2012  . Colon cancer screening 11/25/2012  . Spinal stenosis 08/27/2011  . Vitamin B 12 deficiency 08/06/2011  . Neuropathy ([place]) 07/23/2011  . Obesity 05/13/2011  . Falls frequently 05/01/2011  . Prostate cancer screening 11/07/2010  . DEPRESSION 02/26/2010  . Essential hypertension, benign 03/29/2008  . COPD (chronic obstructive pulmonary disease) ([place]) 07/08/2007  . Sleep apnea 07/08/2007  . TESTOSTERONE DEFICIENCY 06/22/2007  . Hyperglycemia 01/07/2007  . HYPERCHOLESTEROLEMIA 01/07/2007  . ANXIETY 01/07/2007  . CARPAL TUNNEL SYNDROME 01/07/2007  . PEPTIC ULCER DISEASE 01/07/2007  . OSTEOARTHRITIS, HANDS, BILATERAL 01/07/2007  . SHOULDER PAIN, LEFT, CHRONIC 01/07/2007  .  PALPITATIONS, HX OF 01/07/2007  . NEPHROLITHIASIS, HX OF 01/07/2007   Past Medical History  Diagnosis Date  . Testosterone deficiency   . Snoring   . Fatigue   . Hypopotassemia   . Leg cramps   . Carpal tunnel syndrome   . Arthritis     osteoarthritis both hands  . Palpitations     Hx of   . Shoulder pain, left   . Hypercholesteremia   . Ulcer     peptic ulcer disease  . Nephrolithiasis     Hx of  . Diabetes mellitus     type II  . Anxiety    Past Surgical History  Procedure Laterality Date  . Back surgery  1978    x2 disc   Social History  Substance Use Topics  . Smoking status: Former Smoker    Quit date: 01/14/1993  . Smokeless tobacco: Never Used  . Alcohol Use: No   Family History  Problem Relation Age of Onset  . Hypertension Mother   . Hypertension Father   . Cancer Sister   . Cancer Brother     kidney  . Heart disease Brother     MI  . Emphysema Sister   . Allergies Mother   . Allergies Father   . Heart attack Sister    Allergies  Allergen Reactions  . Aspirin     REACTION: GI upset  . Buspirone Hcl     REACTION: itching  . Crestor [Rosuvastatin Calcium]     Increased his blood sugar   . Prednisone     REACTION: sick, per wife he can take this.   Current Outpatient Prescriptions on File Prior to Visit  Medication Sig Dispense Refill  . alendronate (FOSAMAX) 70 MG tablet Take 70 mg by mouth once a week. Take with a full glass of water on an empty stomach.    . ALPRAZolam (XANAX) 0.5 MG tablet TAKE ONE (1) TABLET BY MOUTH TWO (2) TIMES DAILY AS NEEDED FOR ANXIETY 60 tablet 3  . B Complex Vitamins (VITAMIN B COMPLEX PO) Take 1 tablet by mouth daily.    . bisoprolol (ZEBETA) 5 MG tablet TAKE ONE (1) TABLET BY MOUTH EVERY DAY 30 tablet 5  . Blood Glucose Monitoring Suppl (ONETOUCH VERIO IQ SYSTEM) W/DEVICE KIT Use to test blood sugar once daily and as needed dx 790.29 1 kit 0  . cyclobenzaprine (FLEXERIL) 10 MG tablet Take 10 mg by mouth 3  (three) times daily as needed. For muscle spasms     . gabapentin (NEURONTIN) 100 MG capsule Take 100 mg by mouth. Take 2 capsules three times a day by mouth.    Marland Kitchen lisinopril (PRINIVIL,ZESTRIL)  5 MG tablet TAKE ONE (1) TABLET BY MOUTH EVERY DAY 90 tablet 3  . Misc Natural Products (GREEN TEA SLIM) TABS Take 1 tablet by mouth at bedtime.      . ONE TOUCH LANCETS MISC Use to test blood sugar once daily and as needed. Dx 790.29 200 each 1  . ONETOUCH VERIO test strip CHECK GLUCOSE EVERY DAY AND AS NEEDED 100 each 1  . Oxycodone HCl 10 MG TABS Take 1 tablet by mouth every 6 (six) hours as needed.    . potassium chloride SA (K-DUR,KLOR-CON) 20 MEQ tablet TAKE ONE (1) TABLET BY MOUTH EVERY DAY 90 tablet 3  . travoprost, benzalkonium, (TRAVATAN) 0.004 % ophthalmic solution Place 1 drop into both eyes at bedtime.       No current facility-administered medications on file prior to visit.      Review of Systems Review of Systems  Constitutional: Negative for fever, appetite change, fatigue and unexpected weight change.  Eyes: Negative for pain and visual disturbance.  ENT pos for hearing loss Respiratory: Negative for cough and shortness of breath.   Cardiovascular: Negative for cp or palpitations    Gastrointestinal: Negative for nausea, diarrhea and constipation.  Genitourinary: Negative for urgency and frequency.  Skin: Negative for pallor or rash   Neurological: Negative for weakness, light-headedness, numbness and headaches. pos for baseline foot drop and neuropathy symptoms MSK pos for chronic back pain /spinal stenosis  Hematological: Negative for adenopathy. Does not bruise/bleed easily.  Psychiatric/Behavioral: Negative for dysphoric mood. The patient is not nervous/anxious.         Objective:   Physical Exam  Constitutional: He appears well-developed and well-nourished. No distress.  obese and well appearing   HENT:  Head: Normocephalic and atraumatic.  Right Ear: External ear  normal.  Left Ear: External ear normal.  Nose: Nose normal.  Mouth/Throat: Oropharynx is clear and moist.  Eyes: Conjunctivae and EOM are normal. Pupils are equal, round, and reactive to light. Right eye exhibits no discharge. Left eye exhibits no discharge. No scleral icterus.  Neck: Normal range of motion. Neck supple. No JVD present. Carotid bruit is not present. No thyromegaly present.  Cardiovascular: Normal rate, regular rhythm, normal heart sounds and intact distal pulses.  Exam reveals no gallop.   Pulmonary/Chest: Effort normal and breath sounds normal. No respiratory distress. He has no wheezes. He exhibits no tenderness.  Diffusely distant bs   Abdominal: Soft. Bowel sounds are normal. He exhibits no distension, no abdominal bruit and no mass. There is no tenderness.  Musculoskeletal: He exhibits no edema or tenderness.  Poor rom of LS  Foot drop persists -not wearing AFO today  Lymphadenopathy:    He has no cervical adenopathy.  Neurological: He is alert. He has normal reflexes. No cranial nerve deficit. He exhibits normal muscle tone. Coordination normal.  Skin: Skin is warm and dry. No rash noted. No erythema. No pallor.  SKs and solar damage noted   Psychiatric: He has a normal mood and affect.          Assessment & Plan:   Problem List Items Addressed This Visit      Cardiovascular and Mediastinum   Essential hypertension, benign - Primary    bp in fair control at this time  BP Readings from Last 1 Encounters:  04/07/15 116/78   No changes needed Disc lifstyle change with low sodium diet and exercise  Labs reviewed       Relevant Medications   lisinopril (  PRINIVIL,ZESTRIL) 5 MG tablet     Digestive   Vitamin B 12 deficiency    Lab Results  Component Value Date   [id number] 510 03/24/2015   Doing ok on current supplementation         Musculoskeletal and Integument   Osteoporosis    On fosamax Followed by pain clinic  Disc need for calcium/  vitamin D/ wt bearing exercise and bone density test every 2 y to monitor Disc safety/ fracture risk in detail          Other   HYPERCHOLESTEROLEMIA    Diet controlled Disc goals for lipids and reasons to control them Rev labs with pt Rev low sat fat diet in detail       Relevant Medications   lisinopril (PRINIVIL,ZESTRIL) 5 MG tablet   Hyperglycemia    Lab Results  Component Value Date   HGBA1C 6.4 03/24/2015   This is down  Disc imp of low glycemic diet and wt control to prevent DM      Obesity    Discussed how this problem influences overall health and the risks it imposes  Reviewed plan for weight loss with lower calorie diet (via better food choices and also portion control or program like weight watchers) and exercise building up to or more than 30 minutes 5 days per week including some aerobic activity

## 2015-04-07 NOTE — Patient Instructions (Addendum)
Take care of yourself  Keep watching your diet for sugar and fat Labs are fairly stable  No change in medicine  Don't eat too much fruit- keep servings controlled  For cholesterol  (Avoid red meat/ fried foods/ egg yolks/ fatty breakfast meats/ butter, cheese and high fat dairy/ and shellfish)   F/u in 6 mo with labs prior

## 2015-04-09 DIAGNOSIS — Z0000 Encounter for general adult medical examination without abnormal findings: Secondary | ICD-10-CM | POA: Insufficient documentation

## 2015-04-09 NOTE — Assessment & Plan Note (Signed)
Diet controlled  Disc goals for lipids and reasons to control them Rev labs with pt Rev low sat fat diet in detail  

## 2015-04-09 NOTE — Assessment & Plan Note (Signed)
bp in fair control at this time  BP Readings from Last 1 Encounters:  04/07/15 116/78   No changes needed Disc lifstyle change with low sodium diet and exercise  Labs reviewed

## 2015-04-09 NOTE — Assessment & Plan Note (Signed)
Lab Results  Component Value Date   [id number] 034 03/24/2015   Doing ok on current supplementation

## 2015-04-09 NOTE — Assessment & Plan Note (Signed)
Discussed how this problem influences overall health and the risks it imposes  Reviewed plan for weight loss with lower calorie diet (via better food choices and also portion control or program like weight watchers) and exercise building up to or more than 30 minutes 5 days per week including some aerobic activity    

## 2015-04-09 NOTE — Assessment & Plan Note (Signed)
Reviewed health habits including diet and exercise and skin cancer prevention Reviewed appropriate screening tests for age  Also reviewed health mt list, fam hx and immunization status , as well as social and family history   Rev AMW visit-pt declines further eval or tx of hearing loss  See HPI Labs rev Declines colon cancer screening  Take care of yourself  Keep watching your diet for sugar and fat Labs are fairly stable  No change in medicine  Don't eat too much fruit- keep servings controlled  For cholesterol  (Avoid red meat/ fried foods/ egg yolks/ fatty breakfast meats/ butter, cheese and high fat dairy/ and shellfish)   F/u in 6 mo with labs prior

## 2015-04-09 NOTE — Assessment & Plan Note (Signed)
On fosamax Followed by pain clinic  Disc need for calcium/ vitamin D/ wt bearing exercise and bone density test every 2 y to monitor Disc safety/ fracture risk in detail

## 2015-04-09 NOTE — Assessment & Plan Note (Signed)
Lab Results  Component Value Date   HGBA1C 6.4 03/24/2015   This is down  Disc imp of low glycemic diet and wt control to prevent DM

## 2015-04-26 ENCOUNTER — Encounter: Payer: Self-pay | Admitting: Family Medicine

## 2015-06-28 ENCOUNTER — Other Ambulatory Visit: Payer: Self-pay | Admitting: Family Medicine

## 2015-06-28 NOTE — Telephone Encounter (Signed)
If he was given 30 with 3 addn refills I don't think he would be due until 6/27? You may need to check with him or the pharmacy

## 2015-06-28 NOTE — Telephone Encounter (Signed)
Electronic refill request, pt has f/u on 10/13/15, last filled on 03/13/15 #30 with 3 additional refills (? If to soon)

## 2015-06-29 NOTE — Telephone Encounter (Signed)
Talked to pharmacy and pt did get Rx early in April, pt's fill dates are 03/14/15, 04/11/15, 05/04/15, and 06/01/15, so pt is out of med

## 2015-06-29 NOTE — Telephone Encounter (Signed)
Px written for call in   

## 2015-06-29 NOTE — Telephone Encounter (Signed)
Rx called in as prescribed 

## 2015-07-27 ENCOUNTER — Other Ambulatory Visit: Payer: Self-pay | Admitting: Physical Medicine and Rehabilitation

## 2015-07-27 ENCOUNTER — Ambulatory Visit
Admission: RE | Admit: 2015-07-27 | Discharge: 2015-07-27 | Disposition: A | Discharge status: Home / Self Care | Payer: Medicare Other | Source: Ambulatory Visit | Attending: Physical Medicine and Rehabilitation | Admitting: Physical Medicine and Rehabilitation

## 2015-07-27 DIAGNOSIS — W19XXXA Unspecified fall, initial encounter: Secondary | ICD-10-CM

## 2015-07-27 DIAGNOSIS — M25552 Pain in left hip: Secondary | ICD-10-CM

## 2015-07-30 ENCOUNTER — Emergency Department (HOSPITAL_COMMUNITY): Payer: Medicare Other

## 2015-07-30 ENCOUNTER — Encounter (HOSPITAL_COMMUNITY): Payer: Self-pay | Admitting: Emergency Medicine

## 2015-07-30 ENCOUNTER — Emergency Department (HOSPITAL_COMMUNITY)
Admission: EM | Admit: 2015-07-30 | Discharge: 2015-07-30 | Disposition: A | Discharge status: Home / Self Care | Payer: Medicare Other | Attending: Emergency Medicine | Admitting: Emergency Medicine

## 2015-07-30 DIAGNOSIS — M26609 Unspecified temporomandibular joint disorder, unspecified side: Secondary | ICD-10-CM | POA: Diagnosis not present

## 2015-07-30 DIAGNOSIS — H6092 Unspecified otitis externa, left ear: Secondary | ICD-10-CM | POA: Diagnosis not present

## 2015-07-30 DIAGNOSIS — Z79899 Other long term (current) drug therapy: Secondary | ICD-10-CM | POA: Insufficient documentation

## 2015-07-30 DIAGNOSIS — Z87891 Personal history of nicotine dependence: Secondary | ICD-10-CM | POA: Diagnosis not present

## 2015-07-30 DIAGNOSIS — H9202 Otalgia, left ear: Secondary | ICD-10-CM | POA: Diagnosis present

## 2015-07-30 DIAGNOSIS — E119 Type 2 diabetes mellitus without complications: Secondary | ICD-10-CM | POA: Diagnosis not present

## 2015-07-30 DIAGNOSIS — Z7982 Long term (current) use of aspirin: Secondary | ICD-10-CM | POA: Diagnosis not present

## 2015-07-30 DIAGNOSIS — M2669 Other specified disorders of temporomandibular joint: Secondary | ICD-10-CM

## 2015-07-30 LAB — I-STAT CHEM 8, ED
BUN: 16 mg/dL (ref 6–20)
Calcium, Ion: 1.18 mmol/L (ref 1.12–1.23)
Chloride: 101 mmol/L (ref 101–111)
Creatinine, Ser: 0.7 mg/dL (ref 0.61–1.24)
Glucose, Bld: 151 mg/dL — ABNORMAL HIGH (ref 65–99)
HEMATOCRIT: 41 % (ref 39.0–52.0)
HEMOGLOBIN: 13.9 g/dL (ref 13.0–17.0)
Potassium: 3.8 mmol/L (ref 3.5–5.1)
SODIUM: 137 mmol/L (ref 135–145)
TCO2: 23 mmol/L (ref 0–100)

## 2015-07-30 LAB — CBC WITH DIFFERENTIAL/PLATELET
Basophils Absolute: 0 10*3/uL (ref 0.0–0.1)
Basophils Relative: 0 %
Eosinophils Absolute: 0 10*3/uL (ref 0.0–0.7)
Eosinophils Relative: 0 %
HCT: 40.5 % (ref 39.0–52.0)
Hemoglobin: 13.4 g/dL (ref 13.0–17.0)
Lymphocytes Relative: 7 %
Lymphs Abs: 0.7 10*3/uL (ref 0.7–4.0)
MCH: 28.1 pg (ref 26.0–34.0)
MCHC: 33.1 g/dL (ref 30.0–36.0)
MCV: 84.9 fL (ref 78.0–100.0)
Monocytes Absolute: 0.8 10*3/uL (ref 0.1–1.0)
Monocytes Relative: 8 %
Neutro Abs: 8.9 10*3/uL — ABNORMAL HIGH (ref 1.7–7.7)
Neutrophils Relative %: 85 %
Platelets: 200 10*3/uL (ref 150–400)
RBC: 4.77 MIL/uL (ref 4.22–5.81)
RDW: 13.8 % (ref 11.5–15.5)
WBC: 10.5 10*3/uL (ref 4.0–10.5)

## 2015-07-30 LAB — BASIC METABOLIC PANEL
Anion gap: 6 (ref 5–15)
BUN: 14 mg/dL (ref 6–20)
CO2: 24 mmol/L (ref 22–32)
Calcium: 9.2 mg/dL (ref 8.9–10.3)
Chloride: 105 mmol/L (ref 101–111)
Creatinine, Ser: 0.75 mg/dL (ref 0.61–1.24)
GFR calc Af Amer: >60 mL/min (ref >60)
GFR calc non Af Amer: >60 mL/min (ref >60)
Glucose, Bld: 156 mg/dL — ABNORMAL HIGH (ref 65–99)
Potassium: 3.8 mmol/L (ref 3.5–5.1)
Sodium: 135 mmol/L (ref 135–145)

## 2015-07-30 LAB — C-REACTIVE PROTEIN: CRP: 5.7 mg/dL — ABNORMAL HIGH (ref <1.0)

## 2015-07-30 LAB — SEDIMENTATION RATE: Sed Rate: 31 mm/hr — ABNORMAL HIGH (ref 0–16)

## 2015-07-30 MED ORDER — CIPROFLOXACIN HCL 500 MG PO TABS
750.0000 mg | ORAL_TABLET | Freq: Once | ORAL | Status: AC
  Administered 2015-07-30: 750 mg via ORAL
  Filled 2015-07-30: qty 2

## 2015-07-30 MED ORDER — IOPAMIDOL (ISOVUE-300) INJECTION 61%
INTRAVENOUS | Status: AC
  Administered 2015-07-30: 75 mL
  Filled 2015-07-30: qty 75

## 2015-07-30 MED ORDER — FENTANYL CITRATE (PF) 100 MCG/2ML IJ SOLN
50.0000 ug | Freq: Once | INTRAMUSCULAR | Status: AC
  Administered 2015-07-30: 50 ug via INTRAVENOUS
  Filled 2015-07-30: qty 2

## 2015-07-30 MED ORDER — HYDROCODONE-ACETAMINOPHEN 5-325 MG PO TABS: 1.0000 | ORAL_TABLET | ORAL | Status: DC | PRN

## 2015-07-30 MED ORDER — CIPROFLOXACIN HCL 750 MG PO TABS: 750.0000 mg | ORAL_TABLET | Freq: Two times a day (BID) | ORAL | Status: DC

## 2015-07-30 MED ORDER — CIPROFLOXACIN-DEXAMETHASONE 0.3-0.1 % OT SUSP
4.0000 [drp] | Freq: Two times a day (BID) | OTIC | Status: DC
  Administered 2015-07-30: 4 [drp] via OTIC
  Filled 2015-07-30: qty 7.5

## 2015-07-30 MED ORDER — MORPHINE SULFATE (PF) 4 MG/ML IV SOLN
3.0000 mg | Freq: Once | INTRAVENOUS | Status: AC
  Administered 2015-07-30: 3 mg via INTRAVENOUS
  Filled 2015-07-30: qty 1

## 2015-07-30 NOTE — Discharge Instructions (Signed)
Please take medication as directed. Please contact your nose and throat doctor tomorrow to schedule follow-up evaluation. If your symptoms worsen, or any new or concerning signs or symptoms present please return to the emergency room for reevaluation and further management.  Otitis Externa Otitis externa is a bacterial or fungal infection of the outer ear canal. This is the area from the eardrum to the outside of the ear. Otitis externa is sometimes called "swimmer's ear." CAUSES  Possible causes of infection include:  Swimming in dirty water.  Moisture remaining in the ear after swimming or bathing.  Mild injury (trauma) to the ear.  Objects stuck in the ear (foreign body).  Cuts or scrapes (abrasions) on the outside of the ear. SIGNS AND SYMPTOMS  The first symptom of infection is often itching in the ear canal. Later signs and symptoms may include swelling and redness of the ear canal, ear pain, and yellowish-white fluid (pus) coming from the ear. The ear pain may be worse when pulling on the earlobe. DIAGNOSIS  Your health care provider will perform a physical exam. A sample of fluid may be taken from the ear and examined for bacteria or fungi. TREATMENT  Antibiotic ear drops are often given for 10 to 14 days. Treatment may also include pain medicine or corticosteroids to reduce itching and swelling. HOME CARE INSTRUCTIONS   Apply antibiotic ear drops to the ear canal as prescribed by your health care provider.  Take medicines only as directed by your health care provider.  If you have diabetes, follow any additional treatment instructions from your health care provider.  Keep all follow-up visits as directed by your health care provider. PREVENTION   Keep your ear dry. Use the corner of a towel to absorb water out of the ear canal after swimming or bathing.  Avoid scratching or putting objects inside your ear. This can damage the ear canal or remove the protective wax that  lines the canal. This makes it easier for bacteria and fungi to grow.  Avoid swimming in lakes, polluted water, or poorly chlorinated pools.  You may use ear drops made of rubbing alcohol and vinegar after swimming. Combine equal parts of white vinegar and alcohol in a bottle. Put 3 or 4 drops into each ear after swimming. SEEK MEDICAL CARE IF:   You have a fever.  Your ear is still red, swollen, painful, or draining pus after 3 days.  Your redness, swelling, or pain gets worse.  You have a severe headache.  You have redness, swelling, pain, or tenderness in the area behind your ear. MAKE SURE YOU:   Understand these instructions.  Will watch your condition.  Will get help right away if you are not doing well or get worse.   This information is not intended to replace advice given to you by your health care provider. Make sure you discuss any questions you have with your health care provider.   Document Released: 12/31/2004 Document Revised: 01/21/2014 Document Reviewed: 01/17/2011 Elsevier Interactive Patient Education [employer].

## 2015-07-30 NOTE — ED Notes (Signed)
Patient complains of increasingly severe left ear pain that started on Friday.  Patient states this morning he noticed bleeding from the ear.  Patient describes the pain as "similar to a really bad toothache".  Alert and oriented at this time.

## 2015-07-30 NOTE — ED Provider Notes (Signed)
CSN: [id number]     Arrival date & time 07/30/15  3570 History   First MD Initiated Contact with Patient 07/30/15 [phone]     Chief Complaint  Patient presents with  . Otalgia   HPI   81 year old male presents today with left jaw and ear pain. Patient reports symptoms started 2 days ago with pain in his jaw or on the TMJ. He reports he was able to eat and drink last night, but had acute onset of severe pain around 4 AM this morning. He reports the pain is located over the year and TMJ, associated swelling, and reported blood coming from his ear. Patient denies any fever or chills, nausea or vomiting. At the time of evaluation patient is unable to fully open his mouth due to pain. Denies any difficulty swallowing or breathing, denies any neck pain. Patient denies any loss of hearing in the ear, denies any purulent discharge. Patient does have a history of diabetes that is currently being managed with weight loss and diet.    Past Medical History  Diagnosis Date  . Testosterone deficiency   . Snoring   . Fatigue   . Hypopotassemia   . Leg cramps   . Carpal tunnel syndrome   . Arthritis     osteoarthritis both hands  . Palpitations     Hx of   . Shoulder pain, left   . Hypercholesteremia   . Ulcer     peptic ulcer disease  . Nephrolithiasis     Hx of  . Diabetes mellitus     type II  . Anxiety    Past Surgical History  Procedure Laterality Date  . Back surgery  1978    x2 disc   Family History  Problem Relation Age of Onset  . Hypertension Mother   . Hypertension Father   . Cancer Sister   . Cancer Brother     kidney  . Heart disease Brother     MI  . Emphysema Sister   . Allergies Mother   . Allergies Father   . Heart attack Sister    Social History  Substance Use Topics  . Smoking status: Former Smoker    Quit date: 01/14/1993  . Smokeless tobacco: Never Used  . Alcohol Use: No    Review of Systems  All other systems reviewed and are negative.   Allergies   Aspirin; Buspirone hcl; Crestor; and Prednisone  Home Medications   Prior to Admission medications   Medication Sig Start Date End Date Taking? Authorizing Provider  alendronate (FOSAMAX) 70 MG tablet Take 70 mg by mouth once a week. Take with a full glass of water on an empty stomach.   Yes Historical Provider, MD  ALPRAZolam (XANAX) 0.5 MG tablet TAKE ONE (1) TABLET BY MOUTH TWO (2) TIMES DAILY AS NEEDED FOR ANXIETY 06/29/15  Yes Abner Greenspan, MD  Blood Glucose Monitoring Suppl (ONETOUCH VERIO IQ SYSTEM) W/DEVICE KIT Use to test blood sugar once daily and as needed dx 790.29 05/06/13  Yes Abner Greenspan, MD  cyclobenzaprine (FLEXERIL) 10 MG tablet Take 10 mg by mouth 3 (three) times daily as needed. For muscle spasms  11/19/10  Yes Abner Greenspan, MD  docusate sodium (COLACE) 100 MG capsule Take 100 mg by mouth daily.   Yes Historical Provider, MD  gabapentin (NEURONTIN) 100 MG capsule Take 200 mg by mouth 3 (three) times daily.    Yes Historical Provider, MD  lisinopril (PRINIVIL,ZESTRIL) 5 MG tablet  TAKE ONE (1) TABLET BY MOUTH EVERY DAY 04/07/15  Yes Abner Greenspan, MD  Misc Natural Products (GREEN TEA SLIM) TABS Take 1 tablet by mouth at bedtime.     Yes Historical Provider, MD  ONE TOUCH LANCETS MISC Use to test blood sugar once daily and as needed. Dx 790.29 05/06/13  Yes Abner Greenspan, MD  [facility] VERIO test strip CHECK GLUCOSE EVERY DAY AND AS NEEDED 05/12/14  Yes Abner Greenspan, MD  Oxycodone HCl 10 MG TABS Take 1 tablet by mouth 2 (two) times daily as needed (pain).  10/28/12  Yes Historical Provider, MD  potassium chloride SA (K-DUR,KLOR-CON) 20 MEQ tablet TAKE ONE (1) TABLET BY MOUTH EVERY DAY 04/07/15  Yes Marne A [name], MD  travoprost, benzalkonium, (TRAVATAN) 0.004 % ophthalmic solution Place 1 drop into both eyes at bedtime.     Yes Historical Provider, MD  bisoprolol (ZEBETA) 5 MG tablet TAKE ONE (1) TABLET BY MOUTH EVERY DAY Patient not taking: Reported on 07/30/2015 01/21/13   Abner Greenspan, MD  ciprofloxacin (CIPRO) 750 MG tablet Take 1 tablet (750 mg total) by mouth 2 (two) times daily. 07/30/15   Okey Regal, PA-C  HYDROcodone-acetaminophen (NORCO/VICODIN) 5-325 MG tablet Take 1 tablet by mouth every 4 (four) hours as needed. 07/30/15   Masaji Billups, PA-C   BP 150/81 mmHg  Pulse 74  Temp(Src) 98.4 F (36.9 C) (Oral)  Resp 14  SpO2 100%    Physical Exam  Constitutional: He is oriented to person, place, and time. He appears well-developed and well-nourished.  HENT:  Head: Normocephalic and atraumatic.  Swelling noted over the left TMJ, severe tenderness to palpation, decrease in ability to fully open jaw, unable to assess dentition or posterior oropharynx at this time. Left external auditory canal with noted erythema and swelling, tympanic membrane appears to be intact but I am unable to fully visualize the membrane due to external auditory canal swelling. No mastoid TTP  Eyes: Conjunctivae are normal. Pupils are equal, round, and reactive to light. Right eye exhibits no discharge. Left eye exhibits no discharge. No scleral icterus.  Neck: Normal range of motion. No JVD present. No tracheal deviation present.  Pulmonary/Chest: Effort normal. No stridor.  Neurological: He is alert and oriented to person, place, and time. Coordination normal.  Skin: Skin is warm and dry. No rash noted. No erythema. No pallor.  Psychiatric: He has a normal mood and affect. His behavior is normal. Judgment and thought content normal.  Nursing note and vitals reviewed.   ED Course  Procedures (including critical care time) Labs Review Labs Reviewed  CBC WITH DIFFERENTIAL/PLATELET - Abnormal; Notable for the following:    Neutro Abs 8.9 (*)    All other components within normal limits  BASIC METABOLIC PANEL - Abnormal; Notable for the following:    Glucose, Bld 156 (*)    All other components within normal limits  SEDIMENTATION RATE - Abnormal; Notable for the following:    Sed  Rate 31 (*)    All other components within normal limits  C-REACTIVE PROTEIN - Abnormal; Notable for the following:    CRP 5.7 (*)    All other components within normal limits  I-STAT CHEM 8, ED - Abnormal; Notable for the following:    Glucose, Bld 151 (*)    All other components within normal limits    Imaging Review Ct Maxillofacial W/cm  07/30/2015  CLINICAL DATA:  Left TMJ pain EXAM: CT MAXILLOFACIAL WITH CONTRAST TECHNIQUE:  Multidetector CT imaging of the maxillofacial structures was performed with intravenous contrast. Multiplanar CT image reconstructions were also generated. A small metallic BB was placed on the right temple in order to reliably differentiate right from left. CONTRAST:  71m ISOVUE-300 IOPAMIDOL (ISOVUE-300) INJECTION 61% COMPARISON:  None. FINDINGS: The scratch set there is mild mucosal thickening involving the floor of the right maxillary sinus. No sinus fluid levels are identified. Left TMJ effusion is identified. No underlying bone erosion identified. Normal appearance of the right TMJ. The patient is edentulous. IMPRESSION: 1. Left TMJ effusion.  No bone erosions identified. 2. Mild right maxillary sinus mucosal thickening. Electronically Signed   By: TKerby MoorsM.D.   On: 07/30/2015 12:22   I have personally reviewed and evaluated these images and lab results as part of my medical decision-making.   EKG Interpretation None      MDM   Final diagnoses:  TMJ inflammation  Otitis externa, left    Labs: CBC, BMP  Imaging: CT maxillofacial with contrast media  Consults: ENT- Dr. BRedmond Baseman [employer]rapeutics: Fentanyl  Discharge Meds: Cipro, Ciprodex   Assessment/Plan:  81year old male presents today with otalgia. Patient on exam appears to have otitis externa, and also swelling over the TMJ. CT scan shows joint effusion, no signs of bony destruction; no overriding cellulitis.   Patient is afebrile, with reassuring laboratory analysis. Dr. BRedmond Basemanof ENT was  consult at instructed to have the patient started on topical Ciprodex, oral Cipro, follow-up in his office this week. Patients presentation does not show signs of malignant otitis external but is concerning with otitis externa and joint effusion; will cover with Cipro oral with close follow-up. Pt was given strict return precautions and follow-up information. Both him and his wife verbalized understanding and agreement to today's plan and had no further questions or concerns.         JOkey Regal PA-C 07/30/15 [address] MD 07/30/15 1620

## 2015-07-31 ENCOUNTER — Other Ambulatory Visit: Payer: Self-pay | Admitting: *Deleted

## 2015-07-31 MED ORDER — ALPRAZOLAM 0.5 MG PO TABS: ORAL_TABLET | ORAL | Status: DC

## 2015-07-31 NOTE — Telephone Encounter (Signed)
Px written for call in   

## 2015-07-31 NOTE — Telephone Encounter (Signed)
Received refill request electronically Last refill 06/29/15 #60 Last office visit 04/07/15

## 2015-08-01 ENCOUNTER — Telehealth: Payer: Self-pay | Admitting: Family Medicine

## 2015-08-01 NOTE — Telephone Encounter (Signed)
Patient went to [facility] ER on Sunday for TMJ and Inflamation and Ottitis External Left Ear and he needs a follow up appt and there is nothing available, please advise.

## 2015-08-01 NOTE — Telephone Encounter (Signed)
Please put him in for 4:15 on friday

## 2015-08-01 NOTE — Telephone Encounter (Signed)
Appt was made

## 2015-08-02 NOTE — Telephone Encounter (Signed)
Rx called in as prescribed 

## 2015-08-04 ENCOUNTER — Encounter: Payer: Self-pay | Admitting: Family Medicine

## 2015-08-04 ENCOUNTER — Ambulatory Visit (INDEPENDENT_AMBULATORY_CARE_PROVIDER_SITE_OTHER): Payer: Medicare Other | Admitting: Family Medicine

## 2015-08-04 VITALS — BP 114/66 | HR 73 | Temp 98.5°F | Ht 69.0 in | Wt 202.0 lb

## 2015-08-04 DIAGNOSIS — M26609 Unspecified temporomandibular joint disorder, unspecified side: Secondary | ICD-10-CM | POA: Diagnosis not present

## 2015-08-04 DIAGNOSIS — H60392 Other infective otitis externa, left ear: Secondary | ICD-10-CM

## 2015-08-04 DIAGNOSIS — H60399 Other infective otitis externa, unspecified ear: Secondary | ICD-10-CM | POA: Insufficient documentation

## 2015-08-04 DIAGNOSIS — M2669 Other specified disorders of temporomandibular joint: Secondary | ICD-10-CM | POA: Insufficient documentation

## 2015-08-04 NOTE — Patient Instructions (Signed)
I'm glad you are feeling better Ear looks good  Continue medicines until done (cipro and ciprodex)  See Dr Redmond Baseman as planned in August - you can discuss the TMJ  Continue cool compresses if they help the swelling   Alert [place] if symptoms start to return   Minimize chewing- no gum/ and avoid chewy foods for now

## 2015-08-04 NOTE — Progress Notes (Signed)
Subjective:    Patient ID: Scott Gomez, male    DOB: [birth year], 81 y.o.   MRN: [id number]  HPI Here for f/u fo ED visit 07/30/15 He was seen for L jaw and ear pain  Had some swelling over L TMJ and reported blood from ear upon arrival to ED  He had severe tenderness of TMJ area on exam, was not able to fully open mouth Also ear canal was erythematous and swollen   Sed rate was 31 CRP was 5.7  Wt Readings from Last 3 Encounters:  08/04/15 202 lb (91.627 kg)  04/07/15 212 lb (96.163 kg)  03/24/15 212 lb 8 oz (96.389 kg)   Eliminated junk food and excessive carbs- doing great   Imaging:  Ct Maxillofacial W/cm  07/30/2015  CLINICAL DATA:  Left TMJ pain EXAM: CT MAXILLOFACIAL WITH CONTRAST TECHNIQUE: Multidetector CT imaging of the maxillofacial structures was performed with intravenous contrast. Multiplanar CT image reconstructions were also generated. A small metallic BB was placed on the right temple in order to reliably differentiate right from left. CONTRAST:  80m ISOVUE-300 IOPAMIDOL (ISOVUE-300) INJECTION 61% COMPARISON:  None. FINDINGS: The scratch set there is mild mucosal thickening involving the floor of the right maxillary sinus. No sinus fluid levels are identified. Left TMJ effusion is identified. No underlying bone erosion identified. Normal appearance of the right TMJ. The patient is edentulous. IMPRESSION: 1. Left TMJ effusion.  No bone erosions identified. 2. Mild right maxillary sinus mucosal thickening. Electronically Signed   By: TKerby MoorsM.D.   On: 07/30/2015 12:22   Dg Hip Unilat With Pelvis 2-3 Views Left  07/27/2015  CLINICAL DATA:  Fall 2 weeks ago with left posterior hip pain. EXAM: DG HIP (WITH OR WITHOUT PELVIS) 2-3V LEFT COMPARISON:  None. FINDINGS: No evidence of femoral neck fracture. There is loss of joint space in the left hip with spurring in the acetabulum and femoral head. Left SI joint in the symphysis pubis are unremarkable. No evidence for  left superior or inferior pubic ramus fracture. IMPRESSION: 1. No acute bony findings. 2. Degenerative changes in the left hip. Electronically Signed   By: EMisty StanleyM.D.   On: 07/27/2015 09:57     TMJ effusion was seen  Dr BRedmond Basemanfrom ENT was consulted  Was started on topical Ciprodex and oral cipro for otitis externa with close f/u  Had fentanyl in ED   Unsure if he got water in his ear - poss when spraying outside of his house   BP Readings from Last 3 Encounters:  08/04/15 114/66  07/30/15 150/81  04/07/15 116/78    Feeling a whole lot better  Swelling is coming down   Has appt in Aug with Dr BRedmond Baseman- will keep that   Patient Active Problem List   Diagnosis Date Noted  . Otitis, externa, infective 08/04/2015  . TMJ (temporomandibular joint disorder) 08/04/2015  . Routine general medical examination at a health care facility 04/09/2015  . Osteoporosis 04/07/2015  . Cough 02/24/2015  . Constipation 05/16/2014  . Rectal bleeding 05/16/2014  . [place]neuropathy ([place] 03/23/2014  . Encounter for Medicare annual wellness exam 11/25/2012  . Colon cancer screening 11/25/2012  . Spinal stenosis 08/27/2011  . Vitamin B 12 deficiency 08/06/2011  . Neuropathy ([facility] [place] 07/23/2011  . Obesity 05/13/2011  . Falls frequently 05/01/2011  . Prostate cancer screening 11/07/2010  . DEPRESSION 02/26/2010  . Essential hypertension, benign 03/29/2008  . COPD (chronic obstructive pulmonary disease) ([place] 07/08/2007  .  Sleep apnea 07/08/2007  . TESTOSTERONE DEFICIENCY 06/22/2007  . Hyperglycemia 01/07/2007  . HYPERCHOLESTEROLEMIA 01/07/2007  . ANXIETY 01/07/2007  . CARPAL TUNNEL SYNDROME 01/07/2007  . PEPTIC ULCER DISEASE 01/07/2007  . OSTEOARTHRITIS, HANDS, BILATERAL 01/07/2007  . SHOULDER PAIN, LEFT, CHRONIC 01/07/2007  . PALPITATIONS, HX OF 01/07/2007  . NEPHROLITHIASIS, HX OF 01/07/2007   Past Medical History:  Diagnosis Date  . Anxiety   . Arthritis    osteoarthritis both hands    . Carpal tunnel syndrome   . Diabetes mellitus    type II  . Fatigue   . Hypercholesteremia   . Hypopotassemia   . Leg cramps   . Nephrolithiasis    Hx of  . Palpitations    Hx of   . Shoulder pain, left   . Snoring   . Testosterone deficiency   . Ulcer    peptic ulcer disease   Past Surgical History:  Procedure Laterality Date  . BACK SURGERY  1978   x2 disc   Social History  Substance Use Topics  . Smoking status: Former Smoker    Quit date: 01/14/1993  . Smokeless tobacco: Never Used  . Alcohol use No   Family History  Problem Relation Age of Onset  . Hypertension Mother   . Hypertension Father   . Cancer Sister   . Cancer Brother     kidney  . Heart disease Brother     MI  . Emphysema Sister   . Allergies Mother   . Allergies Father   . Heart attack Sister    Allergies  Allergen Reactions  . Aspirin     REACTION: GI upset  . Buspirone Hcl     REACTION: itching  . Crestor [Rosuvastatin Calcium]     Increased his blood sugar   . Prednisone     REACTION: sick, per wife he can take this.   Current Outpatient Prescriptions on File Prior to Visit  Medication Sig Dispense Refill  . alendronate (FOSAMAX) 70 MG tablet Take 70 mg by mouth once a week. Take with a full glass of water on an empty stomach.    . ALPRAZolam (XANAX) 0.5 MG tablet TAKE ONE (1) TABLET BY MOUTH TWO (2) TIMES DAILY AS NEEDED FOR ANXIETY 60 tablet 0  . Blood Glucose Monitoring Suppl (ONETOUCH VERIO IQ SYSTEM) W/DEVICE KIT Use to test blood sugar once daily and as needed dx 790.29 1 kit 0  . ciprofloxacin (CIPRO) 750 MG tablet Take 1 tablet (750 mg total) by mouth 2 (two) times daily. 14 tablet 0  . cyclobenzaprine (FLEXERIL) 10 MG tablet Take 10 mg by mouth 3 (three) times daily as needed. For muscle spasms     . docusate sodium (COLACE) 100 MG capsule Take 100 mg by mouth daily.    Marland Kitchen gabapentin (NEURONTIN) 100 MG capsule Take 200 mg by mouth 3 (three) times daily.     Marland Kitchen lisinopril  (PRINIVIL,ZESTRIL) 5 MG tablet TAKE ONE (1) TABLET BY MOUTH EVERY DAY 90 tablet 3  . Misc Natural Products (GREEN TEA SLIM) TABS Take 1 tablet by mouth at bedtime.      . ONE TOUCH LANCETS MISC Use to test blood sugar once daily and as needed. Dx 790.29 200 each 1  . ONETOUCH VERIO test strip CHECK GLUCOSE EVERY DAY AND AS NEEDED 100 each 1  . Oxycodone HCl 10 MG TABS Take 1 tablet by mouth 2 (two) times daily as needed (pain).     . potassium chloride  SA (K-DUR,KLOR-CON) 20 MEQ tablet TAKE ONE (1) TABLET BY MOUTH EVERY DAY 90 tablet 3  . travoprost, benzalkonium, (TRAVATAN) 0.004 % ophthalmic solution Place 1 drop into both eyes at bedtime.       No current facility-administered medications on file prior to visit.     Review of Systems Review of Systems  Constitutional: Negative for fever, appetite change, fatigue and unexpected weight change.  ENT pos for L jaw and ear pain and swelling which is much improved Eyes: Negative for pain and visual disturbance.  Respiratory: Negative for cough and shortness of breath.   Cardiovascular: Negative for cp or palpitations    Gastrointestinal: Negative for nausea, diarrhea and constipation.  Genitourinary: Negative for urgency and frequency.  Skin: Negative for pallor or rash   Neurological: Negative for weakness, light-headedness, numbness and headaches.  Hematological: Negative for adenopathy. Does not bruise/bleed easily.  Psychiatric/Behavioral: Negative for dysphoric mood. The patient is not nervous/anxious.         Objective:   Physical Exam  Constitutional: He appears well-developed and well-nourished. No distress.  Well appearing   HENT:  Head: Normocephalic and atraumatic.  Right Ear: External ear normal.  Mouth/Throat: Oropharynx is clear and moist. No oropharyngeal exudate.  L TM is nl appearing, very slight if any swelling of canal, no bleeding or d/c , very mildly tender No erythema or swelling of pinna or external ear   TMJ  area of L jaw is mildly swollen and tender, no crepitus  No erythema   Eyes: Conjunctivae and EOM are normal. Pupils are equal, round, and reactive to light.  Neck: Normal range of motion. Neck supple. No JVD present. Carotid bruit is not present. No thyromegaly present.  Cardiovascular: Normal rate, regular rhythm, normal heart sounds and intact distal pulses.  Exam reveals no gallop.   Pulmonary/Chest: Effort normal and breath sounds normal. No respiratory distress. He has no wheezes. He has no rales.  No crackles  Abdominal: Soft. Bowel sounds are normal. He exhibits no distension, no abdominal bruit and no mass. There is no tenderness.  Musculoskeletal: He exhibits no edema.  Lymphadenopathy:    He has no cervical adenopathy.  Neurological: He is alert. He has normal reflexes. He displays normal reflexes. No cranial nerve deficit. He exhibits normal muscle tone.  Skin: Skin is warm and dry. No rash noted. No erythema.  Psychiatric: He has a normal mood and affect.          Assessment & Plan:   Problem List Items Addressed This Visit      Nervous and Auditory   Otitis, externa, infective [facility] visit and results reviewed in detail  Much improvement clinically  Will finish both ciprodex and high dose cipro  Will f/u with Dr Redmond Baseman as planned Update if symptoms return or worsen         Musculoskeletal and Integument   TMJ (temporomandibular joint disorder)    Still some pain but overall much improved since tx of otitis externa ? If some pain was referred However TM joint effusion was seen on CT Still some swelling/though improved  Pt will finish abx  Then f/u with ENT as planned  Update if symptoms worsen or do not continue to improve        Other Visit Diagnoses   None.

## 2015-08-04 NOTE — Progress Notes (Signed)
Pre visit review using our clinic review tool, if applicable. No additional management support is needed unless otherwise documented below in the visit note. 

## 2015-08-06 NOTE — Assessment & Plan Note (Signed)
Hospital visit and results reviewed in detail  Much improvement clinically  Will finish both ciprodex and high dose cipro  Will f/u with Dr Redmond Baseman as planned Update if symptoms return or worsen

## 2015-08-06 NOTE — Assessment & Plan Note (Signed)
Still some pain but overall much improved since tx of otitis externa ? If some pain was referred However TM joint effusion was seen on CT Still some swelling/though improved  Pt will finish abx  Then f/u with ENT as planned  Update if symptoms worsen or do not continue to improve

## 2015-08-10 ENCOUNTER — Ambulatory Visit (INDEPENDENT_AMBULATORY_CARE_PROVIDER_SITE_OTHER): Payer: Medicare Other | Admitting: Adult Health

## 2015-08-10 ENCOUNTER — Encounter: Payer: Self-pay | Admitting: Adult Health

## 2015-08-10 VITALS — BP 130/76 | HR 72 | Ht 68.0 in | Wt 208.8 lb

## 2015-08-10 DIAGNOSIS — G4730 Sleep apnea, unspecified: Secondary | ICD-10-CM

## 2015-08-10 NOTE — Patient Instructions (Signed)
Continue on CPAP At bedtime .  Mask fitting with small face mask .  Wear for at least 4 hr each night .  Work on weight loss.  Do not drive if sleepy.  follow up [name]  In 1 year and As needed

## 2015-08-10 NOTE — Progress Notes (Signed)
Subjective:    Patient ID: Scott Gomez, male    DOB: [birth year], 81 y.o.   MRN: [id number]  HPI 81 year old man for follow-up of OSA and chronic bronchitis First seen 06/2007 for excessive daytime somnolence & loud snoring.  Lost to FU x 6yr, presented again 09/2012 - provided with new CPAP  Smoked 2 PPD until 1995, worked in a [employer] 25 yrs.  Every winter catches a cold that is hard ot shake off.    08/10/2015 Follow up : OSA  Pt returns for a 1 year follow up for sleep apnea.  Pt says he is wearing  It everynight but mask leaks all the time .  Has tried nasal mask but hates chin strap.  We discussed new mask with memory foam in smaller size .  He has some sleepiness b/c wakes up frequently with mask issues.  Download of the last 30 days shows excellent compliance with average usage at 4 hours. AHI 0.7. He is on a set pressure of 10 cm of H2O. Positive leaks   Past Medical History:  Diagnosis Date  . Anxiety   . Arthritis    osteoarthritis both hands  . Carpal tunnel syndrome   . Diabetes mellitus    type II  . Fatigue   . Hypercholesteremia   . Hypopotassemia   . Leg cramps   . Nephrolithiasis    Hx of  . Palpitations    Hx of   . Shoulder pain, left   . Snoring   . Testosterone deficiency   . Ulcer    peptic ulcer disease   Current Outpatient Prescriptions on File Prior to Visit  Medication Sig Dispense Refill  . alendronate (FOSAMAX) 70 MG tablet Take 70 mg by mouth once a week. Take with a full glass of water on an empty stomach.    . ALPRAZolam (XANAX) 0.5 MG tablet TAKE ONE (1) TABLET BY MOUTH TWO (2) TIMES DAILY AS NEEDED FOR ANXIETY 60 tablet 0  . Blood Glucose Monitoring Suppl (ONETOUCH VERIO IQ SYSTEM) W/DEVICE KIT Use to test blood sugar once daily and as needed dx 790.29 1 kit 0  . ciprofloxacin-dexamethasone (CIPRODEX) otic suspension Place 4 drops into the left ear 2 (two) times daily.    . cyclobenzaprine (FLEXERIL) 10 MG tablet Take 10  mg by mouth 3 (three) times daily as needed. For muscle spasms     . docusate sodium (COLACE) 100 MG capsule Take 100 mg by mouth daily.    .Marland Kitchengabapentin (NEURONTIN) 100 MG capsule Take 200 mg by mouth 3 (three) times daily.     .Marland Kitchenlisinopril (PRINIVIL,ZESTRIL) 5 MG tablet TAKE ONE (1) TABLET BY MOUTH EVERY DAY 90 tablet 3  . ONE TOUCH LANCETS MISC Use to test blood sugar once daily and as needed. Dx 790.29 200 each 1  . ONETOUCH VERIO test strip CHECK GLUCOSE EVERY DAY AND AS NEEDED 100 each 1  . Oxycodone HCl 10 MG TABS Take 1 tablet by mouth 2 (two) times daily as needed (pain).     . potassium chloride SA (K-DUR,KLOR-CON) 20 MEQ tablet TAKE ONE (1) TABLET BY MOUTH EVERY DAY 90 tablet 3  . travoprost, benzalkonium, (TRAVATAN) 0.004 % ophthalmic solution Place 1 drop into both eyes at bedtime.       No current facility-administered medications on file prior to visit.      Significant tests/ events  PSG 07/2007 >> mod OSA , AHI 15.2/h with PLMs obliterated by  CPAP 8, med FF mask  Spirometry 6/09>>FEV1 78% (surprisingly good), smaller airways affected 48%   Review of Systems  neg for any significant sore throat, dysphagia, itching, sneezing, nasal congestion or excess/ purulent secretions, fever, chills, sweats, unintended wt loss, pleuritic or exertional cp, hempoptysis, orthopnea pnd or change in chronic leg swelling. Also denies presyncope, palpitations, heartburn, abdominal pain, nausea, vomiting, diarrhea or change in bowel or urinary habits, dysuria,hematuria, rash, arthralgias, visual complaints, headache, numbness weakness or ataxia.      Objective:   Physical Exam  Vitals:   08/10/15 1446  BP: 130/76  Pulse: 72  SpO2: 96%  Weight: 208 lb 12.8 oz (94.7 kg)  Height: '5\' 8"'  (1.727 m)     Gen. Pleasant, obese, in no distress ENT - no lesions, no post nasal drip Neck: No JVD, no thyromegaly, no carotid bruits Lungs: no use of accessory muscles, no dullness to percussion,  decreased without rales or rhonchi  Cardiovascular: Rhythm regular, heart sounds  normal, no murmurs or gallops, no peripheral edema Musculoskeletal: No deformities, no cyanosis or clubbing , no tremors  Milica Gully NP-C  [facility]  08/10/2015

## 2015-08-10 NOTE — Assessment & Plan Note (Signed)
Moderate sleep apnea, controlled on C Pap. Patient is having mask issues. We'll try a smaller mask with memory foam   Plan  Continue on CPAP At bedtime .  Mask fitting with small face mask .  Wear for at least 4 hr each night .  Work on weight loss.  Do not drive if sleepy.  follow up [name]  In 1 year and As needed

## 2015-08-22 ENCOUNTER — Telehealth: Payer: Self-pay | Admitting: Adult Health

## 2015-08-22 NOTE — Telephone Encounter (Signed)
Spoke with pt's wife (dpr on file), states that they were told by [facility] that they were unable to provide pt with a memory foam cpap mask and a mask fitting, so patient was referred to [facility] sleep center for mask fitting.   Called [facility] to verify, stated that they were unable to provide the mask and had referred pt to [facility] at the [facility] sleep center for mask fitting. Called [facility] sleep center to make sure that pt can receive both the mask and mask fitting through this office- lmtcb X1 for [place].  Will await call back.

## 2015-08-22 NOTE — Telephone Encounter (Signed)
[phone], pt wife cb

## 2015-08-22 NOTE — Telephone Encounter (Signed)
Scott Gomez x 1 or Scott Gomez

## 2015-08-22 NOTE — Telephone Encounter (Signed)
lmtcb X1 for pt's wife.  

## 2015-08-22 NOTE — Telephone Encounter (Signed)
[facility] with Sleep [facility]tr returning Ashley's call, [phone].

## 2015-08-24 NOTE — Telephone Encounter (Signed)
Called [facility] sleep center and Lynnae Sandhoff will be back on Monday--will need to call and speak with him about this to make sure that they have the mask that the pt will need.     Called and spoke with pts wife and she is aware that we will call her back on Monday after speaking with Lynnae Sandhoff.

## 2015-08-25 ENCOUNTER — Other Ambulatory Visit: Payer: Self-pay | Admitting: Family Medicine

## 2015-08-28 ENCOUNTER — Other Ambulatory Visit: Payer: Self-pay | Admitting: *Deleted

## 2015-08-28 ENCOUNTER — Encounter: Payer: Self-pay | Admitting: Adult Health

## 2015-08-28 MED ORDER — ALPRAZOLAM 0.5 MG PO TABS: ORAL_TABLET | ORAL | 0 refills | Status: DC

## 2015-08-28 NOTE — Telephone Encounter (Signed)
Fax refill request, pt has f/u on 10/13/15, last filled on 07/31/15 #60 tabs with 0 refills, please advise

## 2015-08-28 NOTE — Telephone Encounter (Signed)
Rx called in a prescribed

## 2015-08-28 NOTE — Telephone Encounter (Signed)
Attempted to call the sleep center. There was no answer and I could not leave a message. Will try back.

## 2015-08-28 NOTE — Telephone Encounter (Signed)
Px written for call in   

## 2015-08-29 NOTE — Telephone Encounter (Signed)
Called and spoke with Lynnae Sandhoff and he stated that they have ordered this mask for the pt and that this should be in tomorrow.  Lynnae Sandhoff has let the pt and his wife know this and will be contacting them once the mask comes in tomorrow.

## 2015-09-26 ENCOUNTER — Other Ambulatory Visit: Payer: Self-pay | Admitting: *Deleted

## 2015-09-26 MED ORDER — ALPRAZOLAM 0.5 MG PO TABS: ORAL_TABLET | ORAL | 0 refills | Status: DC

## 2015-09-26 NOTE — Telephone Encounter (Signed)
Pt has f/u scheduled on 10/13/15, last filled on 08/28/15 #60 tabs with 0 refill, please advise

## 2015-09-26 NOTE — Telephone Encounter (Signed)
Px written for call in   

## 2015-09-27 NOTE — Telephone Encounter (Signed)
CALLED IN TO [place], [place] [address]Phone: [phone]

## 2015-10-06 ENCOUNTER — Other Ambulatory Visit (INDEPENDENT_AMBULATORY_CARE_PROVIDER_SITE_OTHER): Payer: Medicare Other

## 2015-10-06 DIAGNOSIS — I10 Essential (primary) hypertension: Secondary | ICD-10-CM

## 2015-10-06 DIAGNOSIS — R739 Hyperglycemia, unspecified: Secondary | ICD-10-CM

## 2015-10-06 DIAGNOSIS — E7800 Pure hypercholesterolemia, unspecified: Secondary | ICD-10-CM | POA: Diagnosis not present

## 2015-10-06 LAB — LIPID PANEL
CHOL/HDL RATIO: 4
Cholesterol: 172 mg/dL (ref 0–200)
HDL: 43.6 mg/dL (ref >39.00)
LDL Cholesterol: 110 mg/dL — ABNORMAL HIGH (ref 0–99)
NONHDL: 127.93
Triglycerides: 90 mg/dL (ref 0.0–149.0)
VLDL: 18 mg/dL (ref 0.0–40.0)

## 2015-10-06 LAB — COMPREHENSIVE METABOLIC PANEL
ALT: 15 U/L (ref 0–53)
AST: 18 U/L (ref 0–37)
Albumin: 4.1 g/dL (ref 3.5–5.2)
Alkaline Phosphatase: 45 U/L (ref 39–117)
BUN: 22 mg/dL (ref 6–23)
CHLORIDE: 106 meq/L (ref 96–112)
CO2: 27 mEq/L (ref 19–32)
Calcium: 9.1 mg/dL (ref 8.4–10.5)
Creatinine, Ser: 0.79 mg/dL (ref 0.40–1.50)
GFR: 100.44 mL/min (ref >60.00)
GLUCOSE: 89 mg/dL (ref 70–99)
POTASSIUM: 4.3 meq/L (ref 3.5–5.1)
SODIUM: 139 meq/L (ref 135–145)
Total Bilirubin: 0.5 mg/dL (ref 0.2–1.2)
Total Protein: 6.9 g/dL (ref 6.0–8.3)

## 2015-10-06 LAB — HEMOGLOBIN A1C: HEMOGLOBIN A1C: 5.7 % (ref 4.6–6.5)

## 2015-10-10 ENCOUNTER — Ambulatory Visit: Payer: Medicare Other

## 2015-10-13 ENCOUNTER — Encounter: Payer: Self-pay | Admitting: Family Medicine

## 2015-10-13 ENCOUNTER — Ambulatory Visit (INDEPENDENT_AMBULATORY_CARE_PROVIDER_SITE_OTHER): Payer: Medicare Other | Admitting: Family Medicine

## 2015-10-13 VITALS — BP 128/72 | HR 66 | Temp 98.1°F | Ht 68.0 in | Wt 207.5 lb

## 2015-10-13 DIAGNOSIS — Z23 Encounter for immunization: Secondary | ICD-10-CM

## 2015-10-13 DIAGNOSIS — G629 Polyneuropathy, unspecified: Secondary | ICD-10-CM

## 2015-10-13 DIAGNOSIS — I10 Essential (primary) hypertension: Secondary | ICD-10-CM

## 2015-10-13 DIAGNOSIS — E669 Obesity, unspecified: Secondary | ICD-10-CM

## 2015-10-13 DIAGNOSIS — R739 Hyperglycemia, unspecified: Secondary | ICD-10-CM | POA: Diagnosis not present

## 2015-10-13 DIAGNOSIS — E7800 Pure hypercholesterolemia, unspecified: Secondary | ICD-10-CM

## 2015-10-13 DIAGNOSIS — J410 Simple chronic bronchitis: Secondary | ICD-10-CM

## 2015-10-13 NOTE — Assessment & Plan Note (Signed)
bp in fair control at this time  BP Readings from Last 1 Encounters:  10/13/15 128/72   No changes needed Disc lifstyle change with low sodium diet and exercise   Labs rev

## 2015-10-13 NOTE — Assessment & Plan Note (Signed)
Pt still c/o of pins and needles feeling in L foot  Tolerates this on gabapentin

## 2015-10-13 NOTE — Progress Notes (Signed)
Subjective:    Patient ID: Scott Gomez, male    DOB: 04/15/1934, 82 y.o.   MRN: [id number]  HPI Here for f/u of chronic health problems  Feeling pretty good overall Struggling with knee problems  Getting shots in his knees -helps short term    Wt Readings from Last 3 Encounters:  10/13/15 207 lb 8 oz (94.1 kg)  08/10/15 208 lb 12.8 oz (94.7 kg)  08/04/15 202 lb (91.6 kg)  down 1lb from July  Hard time getting under 200  Eating well- watching carbs and also fats  bmi is 31.5  bp is stable today  No cp or palpitations or headaches or edema  No side effects to medicines  BP Readings from Last 3 Encounters:  10/13/15 128/72  08/10/15 130/76  08/04/15 114/66      Hx of hyperlipidemia Lab Results  Component Value Date   CHOL 172 10/06/2015   CHOL 188 03/24/2015   CHOL 219 (H) 03/23/2014   Lab Results  Component Value Date   HDL 43.60 10/06/2015   HDL 41.90 03/24/2015   HDL 47.20 03/23/2014   Lab Results  Component Value Date   LDLCALC 110 (H) 10/06/2015   LDLCALC 118 (H) 03/24/2015   LDLCALC 113 (H) 05/06/2011   Lab Results  Component Value Date   TRIG 90.0 10/06/2015   TRIG 137.0 03/24/2015   TRIG 345.0 (H) 03/23/2014   Lab Results  Component Value Date   CHOLHDL 4 10/06/2015   CHOLHDL 4 03/24/2015   CHOLHDL 5 03/23/2014   Lab Results  Component Value Date   LDLDIRECT 122.0 03/24/2015   LDLDIRECT 134.0 03/23/2014   LDLDIRECT 149.9 11/25/2012   diet controlled   Hyperglycemia  Lab Results  Component Value Date   HGBA1C 5.7 10/06/2015  this is down from 6.4 This is from cutting carbs  He is watching them closely   Cannot do much exercise due to his chronic pain problems and foot/back problems  Cannot walk for long periods  Also cannot stand up in the water - balance is too bad/ used to go to the [employer] his drug screens at the pain clinic   Patient Active Problem List   Diagnosis Date Noted  . Otitis, externa, infective 08/04/2015   . TMJ (temporomandibular joint disorder) 08/04/2015  . Routine general medical examination at a health care facility 04/09/2015  . Osteoporosis 04/07/2015  . Cough 02/24/2015  . Constipation 05/16/2014  . Rectal bleeding 05/16/2014  . Polyneuropathy ([place]) 03/23/2014  . Encounter for Medicare annual wellness exam 11/25/2012  . Colon cancer screening 11/25/2012  . Spinal stenosis 08/27/2011  . Vitamin B 12 deficiency 08/06/2011  . Neuropathy ([place]) 07/23/2011  . Obesity 05/13/2011  . Falls frequently 05/01/2011  . Prostate cancer screening 11/07/2010  . DEPRESSION 02/26/2010  . Essential hypertension, benign 03/29/2008  . COPD (chronic obstructive pulmonary disease) ([place]) 07/08/2007  . Sleep apnea 07/08/2007  . TESTOSTERONE DEFICIENCY 06/22/2007  . Hyperglycemia 01/07/2007  . HYPERCHOLESTEROLEMIA 01/07/2007  . ANXIETY 01/07/2007  . CARPAL TUNNEL SYNDROME 01/07/2007  . PEPTIC ULCER DISEASE 01/07/2007  . OSTEOARTHRITIS, HANDS, BILATERAL 01/07/2007  . SHOULDER PAIN, LEFT, CHRONIC 01/07/2007  . PALPITATIONS, HX OF 01/07/2007  . NEPHROLITHIASIS, HX OF 01/07/2007   Past Medical History:  Diagnosis Date  . Anxiety   . Arthritis    osteoarthritis both hands  . Carpal tunnel syndrome   . Diabetes mellitus    type II  . Fatigue   .  Hypercholesteremia   . Hypopotassemia   . Leg cramps   . Nephrolithiasis    Hx of  . Palpitations    Hx of   . Shoulder pain, left   . Snoring   . Testosterone deficiency   . Ulcer    peptic ulcer disease   Past Surgical History:  Procedure Laterality Date  . BACK SURGERY  1978   x2 disc   Social History  Substance Use Topics  . Smoking status: Former Smoker    Quit date: 01/14/1993  . Smokeless tobacco: Never Used  . Alcohol use No   Family History  Problem Relation Age of Onset  . Hypertension Mother   . Hypertension Father   . Cancer Sister   . Cancer Brother     kidney  . Heart disease Brother     MI  . Emphysema Sister   .  Allergies Mother   . Allergies Father   . Heart attack Sister    Allergies  Allergen Reactions  . Aspirin     REACTION: GI upset  . Buspirone Hcl     REACTION: itching  . Crestor [Rosuvastatin Calcium]     Increased his blood sugar   . Prednisone     REACTION: sick, per wife he can take this.   Current Outpatient Prescriptions on File Prior to Visit  Medication Sig Dispense Refill  . alendronate (FOSAMAX) 70 MG tablet Take 70 mg by mouth once a week. Take with a full glass of water on an empty stomach.    . ALPRAZolam (XANAX) 0.5 MG tablet TAKE ONE (1) TABLET BY MOUTH TWO (2) TIMES DAILY AS NEEDED FOR ANXIETY 60 tablet 0  . Blood Glucose Monitoring Suppl (ONETOUCH VERIO IQ SYSTEM) W/DEVICE KIT Use to test blood sugar once daily and as needed dx 790.29 1 kit 0  . cyclobenzaprine (FLEXERIL) 10 MG tablet Take 10 mg by mouth 3 (three) times daily as needed. For muscle spasms     . docusate sodium (COLACE) 100 MG capsule Take 100 mg by mouth daily.    Marland Kitchen gabapentin (NEURONTIN) 100 MG capsule Take 200 mg by mouth 3 (three) times daily.     Marland Kitchen lisinopril (PRINIVIL,ZESTRIL) 5 MG tablet TAKE ONE (1) TABLET BY MOUTH EVERY DAY 90 tablet 3  . ONE TOUCH LANCETS MISC Use to test blood sugar once daily and as needed. Dx 790.29 200 each 1  . ONETOUCH VERIO test strip USE TO CHECK BLOOD GLUCOSE EVERY DAY ANDAS NEEDED 100 each 0  . Oxycodone HCl 10 MG TABS Take 1 tablet by mouth 2 (two) times daily as needed (pain).     . potassium chloride SA (K-DUR,KLOR-CON) 20 MEQ tablet TAKE ONE (1) TABLET BY MOUTH EVERY DAY 90 tablet 3  . travoprost, benzalkonium, (TRAVATAN) 0.004 % ophthalmic solution Place 1 drop into both eyes at bedtime.       No current facility-administered medications on file prior to visit.     Review of Systems Review of Systems  Constitutional: Negative for fever, appetite change, fatigue and unexpected weight change.  Eyes: Negative for pain and visual disturbance.  Respiratory:  Negative for cough and shortness of breath.   Cardiovascular: Negative for cp or palpitations    Gastrointestinal: Negative for nausea, diarrhea and constipation.  Genitourinary: Negative for urgency and frequency.  Skin: Negative for pallor or rash   Neurological: Negative for weakness, light-headedness, numbness and headaches.  Hematological: Negative for adenopathy. Does not bruise/bleed easily.  Psychiatric/Behavioral:  Negative for dysphoric mood. The patient is not nervous/anxious. (anxiety is controlled)        Objective:   Physical Exam  Constitutional: He appears well-developed and well-nourished. No distress.  obese and well appearing   HENT:  Head: Normocephalic and atraumatic.  Mouth/Throat: Oropharynx is clear and moist.  Eyes: Conjunctivae and EOM are normal. Pupils are equal, round, and reactive to light.  Neck: Normal range of motion. Neck supple. No JVD present. Carotid bruit is not present. No thyromegaly present.  Cardiovascular: Normal rate, regular rhythm, normal heart sounds and intact distal pulses.  Exam reveals no gallop.   Pulmonary/Chest: Effort normal and breath sounds normal. No respiratory distress. He has no wheezes. He has no rales.  No crackles  Mildly distant bs  Abdominal: Soft. Bowel sounds are normal. He exhibits no distension, no abdominal bruit and no mass. There is no tenderness.  Musculoskeletal: He exhibits no edema.  Lymphadenopathy:    He has no cervical adenopathy.  Neurological: He is alert. He has normal reflexes. No cranial nerve deficit. He exhibits normal muscle tone.  Baseline L foot drop   Skin: Skin is warm and dry. No rash noted.  Bruising on L foot at base of lateral toes-no tenderness  Psychiatric: He has a normal mood and affect.          Assessment & Plan:   Problem List Items Addressed This Visit      Cardiovascular and Mediastinum   Essential hypertension, benign    bp in fair control at this time  BP Readings  from Last 1 Encounters:  10/13/15 128/72   No changes needed Disc lifstyle change with low sodium diet and exercise   Labs rev         Respiratory   COPD (chronic obstructive pulmonary disease) (HCC)    Stable with no c/o        Nervous and Auditory   Polyneuropathy (HCC)    Pt still c/o of pins and needles feeling in L foot  Tolerates this on gabapentin        Other   HYPERCHOLESTEROLEMIA    Mild improvement  Continue to follow  Disc goals for lipids and reasons to control them Rev labs with pt Rev low sat fat diet in detail       Hyperglycemia    Improved with recent better diet Lab Results  Component Value Date   HGBA1C 5.7 10/06/2015   Enc him to keep up the good work      Obesity    Discussed how this problem influences overall health and the risks it imposes  Reviewed plan for weight loss with lower calorie diet (via better food choices and also portion control or program like weight watchers) and exercise building up to or more than 30 minutes 5 days per week including some aerobic activity   Exercise is as tolerated and with safety measures        Other Visit Diagnoses    Need for influenza vaccination    -  Primary   Relevant Orders   Flu Vaccine QUAD 36+ mos IM (Completed)

## 2015-10-13 NOTE — Assessment & Plan Note (Signed)
Mild improvement  Continue to follow  Disc goals for lipids and reasons to control them Rev labs with pt Rev low sat fat diet in detail

## 2015-10-13 NOTE — Progress Notes (Signed)
Pre visit review using our clinic review tool, if applicable. No additional management support is needed unless otherwise documented below in the visit note. 

## 2015-10-13 NOTE — Patient Instructions (Signed)
Keep working on [employer]- you are doing better and numbers are improved Exercise as much as you can safely and keep getting outdoors  Follow up in 6 months with labs prior for annual exam

## 2015-10-13 NOTE — Assessment & Plan Note (Signed)
Stable with no c/o

## 2015-10-13 NOTE — Assessment & Plan Note (Signed)
Discussed how this problem influences overall health and the risks it imposes  Reviewed plan for weight loss with lower calorie diet (via better food choices and also portion control or program like weight watchers) and exercise building up to or more than 30 minutes 5 days per week including some aerobic activity   Exercise is as tolerated and with safety measures

## 2015-10-13 NOTE — Assessment & Plan Note (Signed)
Improved with recent better diet Lab Results  Component Value Date   HGBA1C 5.7 10/06/2015   Enc him to keep up the good work

## 2015-10-24 ENCOUNTER — Other Ambulatory Visit: Payer: Self-pay

## 2015-10-24 MED ORDER — ALPRAZOLAM 0.5 MG PO TABS: ORAL_TABLET | ORAL | 3 refills | Status: DC

## 2015-10-24 NOTE — Telephone Encounter (Signed)
Px written for call in   

## 2015-10-24 NOTE — Telephone Encounter (Signed)
Alprazolam last filled 09-27-15 #60 Last OV 10-13-15 Next OV 04-10-16

## 2015-10-24 NOTE — Telephone Encounter (Signed)
Medication phoned to pharmacy.  

## 2015-12-19 ENCOUNTER — Other Ambulatory Visit: Payer: Self-pay | Admitting: Family Medicine

## 2016-01-03 ENCOUNTER — Ambulatory Visit: Payer: Medicare Other | Admitting: Internal Medicine

## 2016-01-05 ENCOUNTER — Telehealth: Payer: Self-pay | Admitting: Family Medicine

## 2016-01-05 NOTE — Telephone Encounter (Signed)
I spoke with pt and he thinks he is coming down with the flu because his wife recently dx with flu. The wheezing and SOB upon sitting is new. Pt did not sound in distress when I spoke with him and he would not go to ED but did agree to go to UC. FYI to Dr Glori Bickers.

## 2016-01-05 NOTE — Telephone Encounter (Signed)
Patient Name: Scott Gomez DOB: 30-May-1934 Initial Comment caller states he has a cough, sore throat, short of breath and wheezing Nurse Assessment Nurse: Vallery Sa, RN, [place] Date/Time (Eastern Time): 01/05/2016 8:47:37 AM Confirm and document reason for call. If symptomatic, describe symptoms. ---Caller states he developed a sore throat and cough two days ago. He developed shortness of breath and wheezing last night. No severe breathing difficulty. No chest pain. No fever. Alert and responsive. Does the patient have any new or worsening symptoms? ---Yes Will a triage be completed? ---Yes Related visit to physician within the last 2 weeks? ---No Does the PT have any chronic conditions? (i.e. diabetes, asthma, etc.) ---Yes List chronic conditions. ---Diabetes, High Blood Pressure, Sleep Apnea Is this a behavioral health or substance abuse call? ---No Guidelines Guideline Title Affirmed Question Affirmed Notes Breathing Difficulty [1] MODERATE difficulty breathing (e.g., speaks in phrases, SOB even at rest, pulse 100-120) AND [2] NEW-onset or WORSE than normal Final Disposition User Go to ED Now Vallery Sa, RN, [place] Comments Caller declined the Go to ER disposition. Reinforced the Go to ER disposition. He states the last time he went it cost $5,000 and he can't go again. Called the office backline and Reena will assist him further. Connected Reena and Massimo together. Referrals GO TO FACILITY REFUSED Disagree/Comply: Disagree

## 2016-01-05 NOTE — Telephone Encounter (Signed)
He does need to be seen due to wheezing - UC is fine/thanks

## 2016-01-09 ENCOUNTER — Ambulatory Visit (INDEPENDENT_AMBULATORY_CARE_PROVIDER_SITE_OTHER): Payer: Medicare Other | Admitting: Family Medicine

## 2016-01-09 ENCOUNTER — Encounter: Payer: Self-pay | Admitting: Family Medicine

## 2016-01-09 VITALS — BP 150/94 | HR 62 | Temp 98.6°F | Ht 68.0 in | Wt 178.8 lb

## 2016-01-09 DIAGNOSIS — J189 Pneumonia, unspecified organism: Secondary | ICD-10-CM

## 2016-01-09 DIAGNOSIS — J441 Chronic obstructive pulmonary disease with (acute) exacerbation: Secondary | ICD-10-CM | POA: Diagnosis not present

## 2016-01-09 MED ORDER — PREDNISONE 20 MG PO TABS: ORAL_TABLET | ORAL | 0 refills | Status: DC

## 2016-01-09 MED ORDER — AZITHROMYCIN 250 MG PO TABS: ORAL_TABLET | ORAL | 0 refills | Status: DC

## 2016-01-09 MED ORDER — HYDROCODONE-HOMATROPINE 5-1.5 MG/5ML PO SYRP: ORAL_SOLUTION | ORAL | 0 refills | Status: DC

## 2016-01-09 NOTE — Progress Notes (Signed)
Dr. Frederico Hamman T. Gail Creekmore, MD, [facility] [place], [address] Phone: [phone] Fax: [phone]  01/09/2016  Patient: Scott Gomez, MRN: [id number], DOB: 12-11-1934, 81 y.o.  Primary Physician:  Loura Pardon, MD   Chief Complaint  Patient presents with  . Cough    started since Thursday--with chest congestion  . Wheezing  . Shortness of Breath   Subjective:   Scott Gomez is a 81 y.o. very pleasant male patient who presents with the following:  81 yo with COPD, h/o smoking presents with 6 days of worsening cough, wheezing, SOB, production of sputum, coughing and generally feeling terrible.   "Feeling terrible, cannot breathe."   Past Medical History, Surgical History, Social History, Family History, Problem List, Medications, and Allergies have been reviewed and updated if relevant.  Patient Active Problem List   Diagnosis Date Noted  . Otitis, externa, infective 08/04/2015  . TMJ (temporomandibular joint disorder) 08/04/2015  . Routine general medical examination at a health care facility 04/09/2015  . Osteoporosis 04/07/2015  . Cough 02/24/2015  . Constipation 05/16/2014  . Rectal bleeding 05/16/2014  . Polyneuropathy ([place]) 03/23/2014  . Encounter for Medicare annual wellness exam 11/25/2012  . Colon cancer screening 11/25/2012  . Spinal stenosis 08/27/2011  . Vitamin B 12 deficiency 08/06/2011  . Neuropathy ([place]) 07/23/2011  . Obesity 05/13/2011  . Falls frequently 05/01/2011  . Prostate cancer screening 11/07/2010  . DEPRESSION 02/26/2010  . Essential hypertension, benign 03/29/2008  . COPD (chronic obstructive pulmonary disease) ([place]) 07/08/2007  . Sleep apnea 07/08/2007  . TESTOSTERONE DEFICIENCY 06/22/2007  . Hyperglycemia 01/07/2007  . HYPERCHOLESTEROLEMIA 01/07/2007  . ANXIETY 01/07/2007  . CARPAL TUNNEL SYNDROME 01/07/2007  . PEPTIC ULCER DISEASE 01/07/2007  . OSTEOARTHRITIS,  HANDS, BILATERAL 01/07/2007  . SHOULDER PAIN, LEFT, CHRONIC 01/07/2007  . PALPITATIONS, HX OF 01/07/2007  . NEPHROLITHIASIS, HX OF 01/07/2007    Past Medical History:  Diagnosis Date  . Anxiety   . Arthritis    osteoarthritis both hands  . Carpal tunnel syndrome   . Diabetes mellitus    type II  . Fatigue   . Hypercholesteremia   . Hypopotassemia   . Leg cramps   . Nephrolithiasis    Hx of  . Palpitations    Hx of   . Shoulder pain, left   . Snoring   . Testosterone deficiency   . Ulcer ([place])    peptic ulcer disease    Past Surgical History:  Procedure Laterality Date  . BACK SURGERY  1978   x2 disc    Social History   Social History  . Marital status: Married    Spouse name: N/A  . Number of children: N/A  . Years of education: N/A   Occupational History  . Not on file.   Social History Main Topics  . Smoking status: Former Smoker    Quit date: 01/14/1993  . Smokeless tobacco: Never Used  . Alcohol use No  . Drug use: No  . Sexual activity: No   Other Topics Concern  . Not on file   Social History Narrative   Lives with wife [name]   Retired    Family History  Problem Relation Age of Onset  . Hypertension Mother   . Hypertension Father   . Cancer Sister   . Cancer Brother     kidney  . Heart disease Brother     MI  . Emphysema Sister   .  Allergies Mother   . Allergies Father   . Heart attack Sister     Allergies  Allergen Reactions  . Aspirin     REACTION: GI upset  . Buspirone Hcl     REACTION: itching  . Crestor [Rosuvastatin Calcium]     Increased his blood sugar   . Prednisone     REACTION: sick, per wife he can take this.    Medication list reviewed and updated in full in [place].  ROS: GEN: Acute illness details above GI: Tolerating PO intake GU: maintaining adequate hydration and urination Pulm: No SOB Interactive and getting along well at home.  Otherwise, ROS is as per the HPI.  Objective:     BP (!) 150/94   Pulse 62   Temp 98.6 F (37 C) (Oral)   Ht '5\' 8"'  (1.727 m)   Wt 178 lb 12.8 oz (81.1 kg)   SpO2 97%   BMI 27.19 kg/m    GEN: A and O x 3. WDWN. NAD.    ENT: Nose clear, ext NML.  No LAD.  No JVD.  TM's clear. Oropharynx clear.  PULM: Normal WOB, no distress. No crackles, diffuse rhonchi and wheezes CV: RRR, no M/G/R, No rubs, No JVD.   EXT: warm and well-perfused, No c/c/e. PSYCH: Pleasant and conversant.    Laboratory and Imaging Data:  Assessment and Plan:   Community acquired pneumonia, unspecified laterality  COPD exacerbation ([place])  Low dose prednisone given ? H/o prednisone intolerance in chart, but discussed with patient and he agrees. He did take steroids in 2012.   Zpak + supportive care.  Follow-up: No Follow-up on file.  Meds ordered this encounter  Medications  . azithromycin (ZITHROMAX) 250 MG tablet    Sig: 2 tabs po on day 1, then 1 tab po for 4 days    Dispense:  6 tablet    Refill:  0  . predniSONE (DELTASONE) 20 MG tablet    Sig: 1 tab po for 1 week    Dispense:  7 tablet    Refill:  0  . HYDROcodone-homatropine (HYCODAN) 5-1.5 MG/5ML syrup    Sig: 1 tsp po at night before bed prn cough    Dispense:  180 mL    Refill:  0   There are no discontinued medications. No orders of the defined types were placed in this encounter.   Signed,  Maud Deed. Elle Vezina, MD   Allergies as of 01/09/2016      Reactions   Aspirin    REACTION: GI upset   Buspirone Hcl    REACTION: itching   Crestor [rosuvastatin Calcium]    Increased his blood sugar    Prednisone    REACTION: sick, per wife he can take this.      Medication List       Accurate as of 01/09/16  3:36 PM. Always use your most recent med list.          alendronate 70 MG tablet Commonly known as:  FOSAMAX Take 70 mg by mouth once a week. Take with a full glass of water on an empty stomach.   ALPRAZolam 0.5 MG tablet Commonly known as:  XANAX TAKE ONE (1)  TABLET BY MOUTH TWO (2) TIMES DAILY AS NEEDED FOR ANXIETY   azithromycin 250 MG tablet Commonly known as:  ZITHROMAX 2 tabs po on day 1, then 1 tab po for 4 days   cyclobenzaprine 10 MG tablet Commonly known as:  FLEXERIL  Take 10 mg by mouth 3 (three) times daily as needed. For muscle spasms   docusate sodium 100 MG capsule Commonly known as:  COLACE Take 100 mg by mouth daily.   gabapentin 100 MG capsule Commonly known as:  NEURONTIN Take 200 mg by mouth 3 (three) times daily.   HYDROcodone-homatropine 5-1.5 MG/5ML syrup Commonly known as:  HYCODAN 1 tsp po at night before bed prn cough   lisinopril 5 MG tablet Commonly known as:  PRINIVIL,ZESTRIL TAKE ONE (1) TABLET BY MOUTH EVERY DAY   ONE TOUCH LANCETS Misc Use to test blood sugar once daily and as needed. Dx 790.29   ONETOUCH VERIO IQ SYSTEM w/Device Kit Use to test blood sugar once daily and as needed dx 790.29   ONETOUCH VERIO test strip Generic drug:  glucose blood USE TO CHECK BLOOD GLUCOSE EVERY DAY ANDAS NEEDED   Oxycodone HCl 10 MG Tabs Take 1 tablet by mouth 2 (two) times daily as needed (pain).   potassium chloride SA 20 MEQ tablet Commonly known as:  K-DUR,KLOR-CON TAKE ONE (1) TABLET BY MOUTH EVERY DAY   predniSONE 20 MG tablet Commonly known as:  DELTASONE 1 tab po for 1 week   travoprost (benzalkonium) 0.004 % ophthalmic solution Commonly known as:  TRAVATAN Place 1 drop into both eyes at bedtime.

## 2016-01-09 NOTE — Progress Notes (Signed)
Pre visit review using our clinic review tool, if applicable. No additional management support is needed unless otherwise documented below in the visit note. 

## 2016-01-26 DIAGNOSIS — H9202 Otalgia, left ear: Secondary | ICD-10-CM | POA: Insufficient documentation

## 2016-02-12 ENCOUNTER — Other Ambulatory Visit: Payer: Self-pay | Admitting: Family Medicine

## 2016-02-16 ENCOUNTER — Other Ambulatory Visit: Payer: Self-pay | Admitting: *Deleted

## 2016-02-16 MED ORDER — ALPRAZOLAM 0.5 MG PO TABS: ORAL_TABLET | ORAL | 0 refills | Status: DC

## 2016-02-16 NOTE — Telephone Encounter (Signed)
Rx called in as prescribed  Called pt and gave him the filled dates and asked about the early fills but pt said he didn't know what's going on and he is only taking med BID and that's it he hasn't had any days where he took more then prescribed he is only taking it twice daily

## 2016-02-16 NOTE — Telephone Encounter (Signed)
I will start refilling it for a month at a time so I can watch it closer,thanks

## 2016-02-16 NOTE — Telephone Encounter (Signed)
Fax refill request, last filled on 10/24/15 #60 tabs with 3 additional refills, I questioned pharmacy of why early and they said this is pt's pick up/filled dates: 10/24/15, 11/22/15, 12/19/15, and 01/18/16, pt is almost of of med, please advise

## 2016-02-16 NOTE — Telephone Encounter (Signed)
It looks like he is picking is up a bit earlier each time  With the weekend coming up I did write to call it in but please ask him if he is using it more often or giving anyone any med etc   Thanks

## 2016-02-28 ENCOUNTER — Other Ambulatory Visit: Payer: Self-pay | Admitting: Family Medicine

## 2016-02-28 NOTE — Telephone Encounter (Signed)
Check with pharmacy and pt did pick up Rx on 02/16/16 for xanax, this Rx was declined because it's to early to fill

## 2016-03-19 ENCOUNTER — Other Ambulatory Visit: Payer: Self-pay | Admitting: Family Medicine

## 2016-03-19 MED ORDER — ALPRAZOLAM 0.5 MG PO TABS: ORAL_TABLET | ORAL | 0 refills | Status: DC

## 2016-03-19 NOTE — Telephone Encounter (Signed)
Pt has CPE on 04/10/16, xanax last filled on 02/16/16 #60 tabs with 0 refills, please advise

## 2016-03-19 NOTE — Telephone Encounter (Signed)
Px written for call in   

## 2016-03-19 NOTE — Telephone Encounter (Signed)
Rx called in as prescribed 

## 2016-04-03 ENCOUNTER — Telehealth: Payer: Self-pay | Admitting: Family Medicine

## 2016-04-03 DIAGNOSIS — E538 Deficiency of other specified B group vitamins: Secondary | ICD-10-CM

## 2016-04-03 DIAGNOSIS — R739 Hyperglycemia, unspecified: Secondary | ICD-10-CM

## 2016-04-03 DIAGNOSIS — Z0000 Encounter for general adult medical examination without abnormal findings: Secondary | ICD-10-CM

## 2016-04-03 NOTE — Telephone Encounter (Signed)
lab

## 2016-04-04 ENCOUNTER — Ambulatory Visit (INDEPENDENT_AMBULATORY_CARE_PROVIDER_SITE_OTHER): Payer: Medicare Other

## 2016-04-04 VITALS — BP 130/90 | HR 75 | Temp 98.1°F | Ht 68.0 in | Wt 220.2 lb

## 2016-04-04 DIAGNOSIS — R739 Hyperglycemia, unspecified: Secondary | ICD-10-CM | POA: Diagnosis not present

## 2016-04-04 DIAGNOSIS — E538 Deficiency of other specified B group vitamins: Secondary | ICD-10-CM

## 2016-04-04 DIAGNOSIS — Z0000 Encounter for general adult medical examination without abnormal findings: Secondary | ICD-10-CM

## 2016-04-04 LAB — COMPREHENSIVE METABOLIC PANEL
ALT: 12 U/L (ref 0–53)
AST: 16 U/L (ref 0–37)
Albumin: 4.5 g/dL (ref 3.5–5.2)
Alkaline Phosphatase: 51 U/L (ref 39–117)
BUN: 24 mg/dL — AB (ref 6–23)
CHLORIDE: 103 meq/L (ref 96–112)
CO2: 26 mEq/L (ref 19–32)
Calcium: 10 mg/dL (ref 8.4–10.5)
Creatinine, Ser: 0.99 mg/dL (ref 0.40–1.50)
GFR: 77.31 mL/min (ref >60.00)
GLUCOSE: 129 mg/dL — AB (ref 70–99)
POTASSIUM: 4.4 meq/L (ref 3.5–5.1)
SODIUM: 136 meq/L (ref 135–145)
Total Bilirubin: 0.6 mg/dL (ref 0.2–1.2)
Total Protein: 7.5 g/dL (ref 6.0–8.3)

## 2016-04-04 LAB — CBC WITH DIFFERENTIAL/PLATELET
BASOS PCT: 1.1 % (ref 0.0–3.0)
Basophils Absolute: 0.1 10*3/uL (ref 0.0–0.1)
EOS PCT: 1.6 % (ref 0.0–5.0)
Eosinophils Absolute: 0.1 10*3/uL (ref 0.0–0.7)
HCT: 44 % (ref 39.0–52.0)
Hemoglobin: 14.9 g/dL (ref 13.0–17.0)
LYMPHS ABS: 1.5 10*3/uL (ref 0.7–4.0)
Lymphocytes Relative: 27 % (ref 12.0–46.0)
MCHC: 33.8 g/dL (ref 30.0–36.0)
MCV: 85.8 fl (ref 78.0–100.0)
MONO ABS: 0.6 10*3/uL (ref 0.1–1.0)
Monocytes Relative: 11.7 % (ref 3.0–12.0)
Neutro Abs: 3.2 10*3/uL (ref 1.4–7.7)
Neutrophils Relative %: 58.6 % (ref 43.0–77.0)
Platelets: 219 10*3/uL (ref 150.0–400.0)
RBC: 5.13 Mil/uL (ref 4.22–5.81)
RDW: 13.8 % (ref 11.5–15.5)
WBC: 5.5 10*3/uL (ref 4.0–10.5)

## 2016-04-04 LAB — TSH: TSH: 2.38 u[IU]/mL (ref 0.35–4.50)

## 2016-04-04 LAB — LIPID PANEL
CHOL/HDL RATIO: 4
Cholesterol: 200 mg/dL (ref 0–200)
HDL: 45.7 mg/dL (ref >39.00)
LDL Cholesterol: 129 mg/dL — ABNORMAL HIGH (ref 0–99)
NONHDL: 154.09
Triglycerides: 124 mg/dL (ref 0.0–149.0)
VLDL: 24.8 mg/dL (ref 0.0–40.0)

## 2016-04-04 LAB — HEMOGLOBIN A1C: HEMOGLOBIN A1C: 6.1 % (ref 4.6–6.5)

## 2016-04-04 LAB — VITAMIN B12: VITAMIN B 12: 1159 pg/mL — AB (ref 211–911)

## 2016-04-04 NOTE — Patient Instructions (Signed)
Scott Gomez , Thank you for taking time to come for your Medicare Wellness Visit. I appreciate your ongoing commitment to your health goals. Please review the following plan we discussed and let me know if I can assist you in the future.   These are the goals we discussed: Goals    . Increase physical activity          When weather permits, I will continue to walk or do yard work at least 30 min 3-5 days a week.        This is a list of the screening recommended for you and due dates:  Health Maintenance  Topic Date Due  . Tetanus Vaccine  08/22/2019  . Flu Shot  Completed  . Pneumonia vaccines  Completed   Preventive Care for Adults  A healthy lifestyle and preventive care can promote health and wellness. Preventive health guidelines for adults include the following key practices.  . A routine yearly physical is a good way to check with your health care provider about your health and preventive screening. It is a chance to share any concerns and updates on your health and to receive a thorough exam.  . Visit your dentist for a routine exam and preventive care every 6 months. Brush your teeth twice a day and floss once a day. Good oral hygiene prevents tooth decay and gum disease.  . The frequency of eye exams is based on your age, health, family medical history, use  of contact lenses, and other factors. Follow your health care provider's ecommendations for frequency of eye exams.  . Eat a healthy diet. Foods like vegetables, fruits, whole grains, low-fat dairy products, and lean protein foods contain the nutrients you need without too many calories. Decrease your intake of foods high in solid fats, added sugars, and salt. Eat the right amount of calories for you. Get information about a proper diet from your health care provider, if necessary.  . Regular physical exercise is one of the most important things you can do for your health. Most adults should get at least 150 minutes of  moderate-intensity exercise (any activity that increases your heart rate and causes you to sweat) each week. In addition, most adults need muscle-strengthening exercises on 2 or more days a week.  Silver Sneakers may be a benefit available to you. To determine eligibility, you may visit the website: www.silversneakers.com or contact program at 814-605-5102 Mon-Fri between 8AM-8PM.   . Maintain a healthy weight. The body mass index (BMI) is a screening tool to identify possible weight problems. It provides an estimate of body fat based on height and weight. Your health care provider can find your BMI and can help you achieve or maintain a healthy weight.   For adults 20 years and older: ? A BMI below 18.5 is considered underweight. ? A BMI of 18.5 to 24.9 is normal. ? A BMI of 25 to 29.9 is considered overweight. ? A BMI of 30 and above is considered obese.   . Maintain normal blood lipids and cholesterol levels by exercising and minimizing your intake of saturated fat. Eat a balanced diet with plenty of fruit and vegetables. Blood tests for lipids and cholesterol should begin at age 33 and be repeated every 5 years. If your lipid or cholesterol levels are high, you are over 50, or you are at high risk for heart disease, you may need your cholesterol levels checked more frequently. Ongoing high lipid and cholesterol levels should be  treated with medicines if diet and exercise are not working.  . If you smoke, find out from your health care provider how to quit. If you do not use tobacco, please do not start.  . If you choose to drink alcohol, please do not consume more than 2 drinks per day. One drink is considered to be 12 ounces (355 mL) of beer, 5 ounces (148 mL) of wine, or 1.5 ounces (44 mL) of liquor.  . If you are 79-76 years old, ask your health care provider if you should take aspirin to prevent strokes.  . Use sunscreen. Apply sunscreen liberally and repeatedly throughout the day. You  should seek shade when your shadow is shorter than you. Protect yourself by wearing long sleeves, pants, a wide-brimmed hat, and sunglasses year round, whenever you are outdoors.  . Once a month, do a whole body skin exam, using a mirror to look at the skin on your back. Tell your health care provider of new moles, moles that have irregular borders, moles that are larger than a pencil eraser, or moles that have changed in shape or color.

## 2016-04-04 NOTE — Progress Notes (Signed)
PCP notes:   Health maintenance:  No gaps identified.   Abnormal screenings:   Hearing - failed Mini-Cog score: 18/20  Patient concerns:   Patient has complaint of pain in both feet.   Nurse concerns:  None  Next PCP appt:   04/10/16 @ 1430

## 2016-04-04 NOTE — Progress Notes (Signed)
I reviewed health advisor's note, was available for consultation, and agree with documentation and plan.  

## 2016-04-04 NOTE — Progress Notes (Signed)
Pre visit review using our clinic review tool, if applicable. No additional management support is needed unless otherwise documented below in the visit note. 

## 2016-04-04 NOTE — Progress Notes (Signed)
Subjective:   Scott Gomez is a 82 y.o. male who presents for Medicare Annual/Subsequent preventive examination.  Review of Systems:  N/A Cardiac Risk Factors include: advanced age (>60mn, >>60women);obesity (BMI >30kg/m2);dyslipidemia;hypertension;male gender     Objective:    Vitals: BP 130/90 (BP Location: Right Arm, Patient Position: Sitting, Cuff Size: Normal)   Pulse 75   Temp 98.1 F (36.7 C) (Oral)   Ht '5\' 8"'$  (1.727 m) Comment: shoes  Wt 220 lb 4 oz (99.9 kg)   SpO2 95%   BMI 33.49 kg/m   Body mass index is 33.49 kg/m.  Tobacco History  Smoking Status  . Former Smoker  . Quit date: 01/14/1993  Smokeless Tobacco  . Never Used     Counseling given: No   Past Medical History:  Diagnosis Date  . Anxiety   . Arthritis    osteoarthritis both hands  . Carpal tunnel syndrome   . Diabetes mellitus    type II  . Fatigue   . Hypercholesteremia   . Hypopotassemia   . Leg cramps   . Nephrolithiasis    Hx of  . Palpitations    Hx of   . Shoulder pain, left   . Snoring   . Testosterone deficiency   . Ulcer ([place]    peptic ulcer disease   Past Surgical History:  Procedure Laterality Date  . BACK SURGERY  1978   x2 disc   Family History  Problem Relation Age of Onset  . Cancer Brother     kidney  . Heart disease Brother     MI  . Hypertension Mother   . Allergies Mother   . Hypertension Father   . Allergies Father   . Cancer Sister   . Emphysema Sister   . Heart attack Sister    History  Sexual Activity  . Sexual activity: No    Outpatient Encounter Prescriptions as of 04/04/2016  Medication Sig  . alendronate (FOSAMAX) 70 MG tablet Take 70 mg by mouth once a week. Take with a full glass of water on an empty stomach.  . ALPRAZolam (XANAX) 0.5 MG tablet TAKE ONE (1) TABLET BY MOUTH TWO (2) TIMES DAILY AS NEEDED FOR ANXIETY  . Blood Glucose Monitoring Suppl (ONETOUCH VERIO IQ SYSTEM) W/DEVICE KIT Use to test blood sugar once daily and as  needed dx 790.29  . cyclobenzaprine (FLEXERIL) 10 MG tablet Take 10 mg by mouth 3 (three) times daily as needed. For muscle spasms   . docusate sodium (COLACE) 100 MG capsule Take 100 mg by mouth daily.  .Marland Kitchengabapentin (NEURONTIN) 100 MG capsule Take 200 mg by mouth 3 (three) times daily.   .Marland Kitchenlisinopril (PRINIVIL,ZESTRIL) 5 MG tablet TAKE ONE (1) TABLET BY MOUTH EVERY DAY  . ONE TOUCH LANCETS MISC Use to test blood sugar once daily and as needed. Dx 790.29  . ONETOUCH VERIO test strip USE TO CHECK BLOOD GLUCOSE EVERY DAY ANDAS NEEDED  . Oxycodone HCl 10 MG TABS Take 1 tablet by mouth 2 (two) times daily as needed (pain).   . potassium chloride SA (K-DUR,KLOR-CON) 20 MEQ tablet TAKE ONE (1) TABLET BY MOUTH EVERY DAY  . predniSONE (DELTASONE) 20 MG tablet 1 tab po for 1 week  . travoprost, benzalkonium, (TRAVATAN) 0.004 % ophthalmic solution Place 1 drop into both eyes at bedtime.    . [DISCONTINUED] azithromycin (ZITHROMAX) 250 MG tablet 2 tabs po on day 1, then 1 tab po for 4 days  . [  DISCONTINUED] HYDROcodone-homatropine (HYCODAN) 5-1.5 MG/5ML syrup 1 tsp po at night before bed prn cough   No facility-administered encounter medications on file as of 04/04/2016.     Activities of Daily Living In your present state of health, do you have any difficulty performing the following activities: 04/04/2016  Hearing? N  Vision? Y  Difficulty concentrating or making decisions? N  Walking or climbing stairs? Y  Dressing or bathing? N  Doing errands, shopping? Y  Preparing Food and eating ? N  Using the Toilet? N  In the past six months, have you accidently leaked urine? N  Do you have problems with loss of bowel control? N  Managing your Medications? N  Managing your Finances? Y  Housekeeping or managing your Housekeeping? N  Some recent data might be hidden    Patient Care Team: Abner Greenspan, MD as PCP - General Calvert Cantor, MD as Consulting Physician (Ophthalmology) Berle Mull, MD as  Consulting Physician (Sports Medicine) Margaretha Sheffield, MD as Referring Physician (Physical Medicine and Rehabilitation)   Assessment:     Hearing Screening   '125Hz'$  '250Hz'$  '500Hz'$  '1000Hz'$  '2000Hz'$  '3000Hz'$  '4000Hz'$  '6000Hz'$  '8000Hz'$   Right ear:   0 0 40  0    Left ear:   0 0 40  0    Vision Screening Comments: Last vision exam in December 2017 with Dr. Bing Plume   Exercise Activities and Dietary recommendations Current Exercise Habits: Home exercise routine, Type of exercise: walking, Time (Minutes): 30, Frequency (Times/Week): 4, Weekly Exercise (Minutes/Week): 120, Intensity: Mild, Exercise limited by: None identified  Goals    . Increase physical activity          When weather permits, I will continue to walk or do yard work at least 30 min 3-5 days a week.       Fall Risk Fall Risk  04/04/2016 03/24/2015 03/23/2014 11/25/2012 11/12/2011  Falls in the past year? No No Yes Yes -  Number falls in past yr: - - 2 or more 2 or more -  Injury with Fall? - - Yes - -  Risk Factor Category  - - - High Fall Risk -  Risk for fall due to : - - - Impaired mobility;Impaired balance/gait History of fall(s);Impaired mobility  Risk for fall due to (comments): - - - - improved now with AFOs   Depression Screen PHQ 2/9 Scores 04/04/2016 03/24/2015 03/23/2014 11/25/2012  PHQ - 2 Score 0 0 0 0    Cognitive Function MMSE - Mini Mental State Exam 04/04/2016 03/24/2015  Orientation to time 5 5  Orientation to Place 5 5  Registration 3 3  Attention/ Calculation 0 0  Recall 3 3  Language- name 2 objects 0 0  Language- repeat 1 1  Language- follow 3 step command 1 3  Language- follow 3 step command-comments pt was unable to follow 2 steps of 3 step command -  Language- read & follow direction 0 1  Write a sentence 0 0  Copy design 0 0  Total score 18 21       PLEASE NOTE: A Mini-Cog screen was completed. Maximum score is 20. A value of 0 denotes this part of Folstein MMSE was not completed or the patient failed this  part of the Mini-Cog screening.   Mini-Cog Screening Orientation to Time - Max 5 pts Orientation to Place - Max 5 pts Registration - Max 3 pts Recall - Max 3 pts Language Repeat - Max 1 pts Language Follow 3  Step Command - Max 3 pts   Immunization History  Administered Date(s) Administered  . Influenza Split 11/12/2010, 11/12/2011  . Influenza,inj,Quad PF,36+ Mos 11/11/2013, 03/17/2015, 10/13/2015  . Influenza-Unspecified 10/06/2012  . Pneumococcal Conjugate-13 03/23/2014  . Pneumococcal Polysaccharide-23 02/21/2009  . Td 08/21/2009   Screening Tests Health Maintenance  Topic Date Due  . TETANUS/TDAP  08/22/2019  . INFLUENZA VACCINE  Completed  . PNA vac Low Risk Adult  Completed      Plan:     I have personally reviewed and addressed the Medicare Annual Wellness questionnaire and have noted the following in the patient's chart:  A. Medical and social history B. Use of alcohol, tobacco or illicit drugs  C. Current medications and supplements D. Functional ability and status E.  Nutritional status F.  Physical activity G. Advance directives H. List of other physicians I.  Hospitalizations, surgeries, and ER visits in previous 12 months J.  [place] to include hearing, vision, cognitive, depression L. Referrals and appointments - none  In addition, I have reviewed and discussed with patient certain preventive protocols, quality metrics, and best practice recommendations. A written personalized care plan for preventive services as well as general preventive health recommendations were provided to patient.  See attached scanned questionnaire for additional information.   Signed,   Lindell Noe, MHA, BS, LPN Health Coach

## 2016-04-10 ENCOUNTER — Ambulatory Visit (INDEPENDENT_AMBULATORY_CARE_PROVIDER_SITE_OTHER): Payer: Medicare Other | Admitting: Family Medicine

## 2016-04-10 ENCOUNTER — Encounter: Payer: Self-pay | Admitting: Family Medicine

## 2016-04-10 VITALS — BP 132/78 | HR 70 | Temp 98.6°F | Ht 68.0 in | Wt 222.5 lb

## 2016-04-10 DIAGNOSIS — G629 Polyneuropathy, unspecified: Secondary | ICD-10-CM | POA: Diagnosis not present

## 2016-04-10 DIAGNOSIS — R739 Hyperglycemia, unspecified: Secondary | ICD-10-CM | POA: Diagnosis not present

## 2016-04-10 DIAGNOSIS — E538 Deficiency of other specified B group vitamins: Secondary | ICD-10-CM

## 2016-04-10 DIAGNOSIS — E7800 Pure hypercholesterolemia, unspecified: Secondary | ICD-10-CM | POA: Diagnosis not present

## 2016-04-10 DIAGNOSIS — M810 Age-related osteoporosis without current pathological fracture: Secondary | ICD-10-CM | POA: Diagnosis not present

## 2016-04-10 DIAGNOSIS — J410 Simple chronic bronchitis: Secondary | ICD-10-CM

## 2016-04-10 DIAGNOSIS — Z6833 Body mass index (BMI) 33.0-33.9, adult: Secondary | ICD-10-CM

## 2016-04-10 DIAGNOSIS — I10 Essential (primary) hypertension: Secondary | ICD-10-CM

## 2016-04-10 DIAGNOSIS — E6609 Other obesity due to excess calories: Secondary | ICD-10-CM

## 2016-04-10 DIAGNOSIS — Z0000 Encounter for general adult medical examination without abnormal findings: Secondary | ICD-10-CM

## 2016-04-10 MED ORDER — POTASSIUM CHLORIDE CRYS ER 20 MEQ PO TBCR: EXTENDED_RELEASE_TABLET | ORAL | 3 refills | Status: DC

## 2016-04-10 MED ORDER — LISINOPRIL 5 MG PO TABS: ORAL_TABLET | ORAL | 3 refills | Status: DC

## 2016-04-10 NOTE — Patient Instructions (Addendum)
If insurance covers a shingles vaccine I highly recommend getting one  If you are interested in a shingles/zoster vaccine - call your insurance to check on coverage,( you should not get it within 1 month of other vaccines) , then call us for a prescription  for it to take to a pharmacy that gives the shot , or make a nurse visit to get it here depending on your coverage Your B12 level is a little high so you only need to take your B12 pill every other day   Try to get most of your carbohydrates from fruit and vegetables and avoid bread /pasta/rice/crackers and processed food  Get protein with every meal  Lay off the ice cream - for cholesterol and also blood sugar   See if you can get sausage that is all [place] or chicken -since your cholesterol is high  Avoid red meat/ fried foods/ egg yolks/ fatty breakfast meats/ butter, cheese and high fat dairy/ and shellfish

## 2016-04-10 NOTE — Progress Notes (Signed)
Pre visit review using our clinic review tool, if applicable. No additional management support is needed unless otherwise documented below in the visit note. 

## 2016-04-10 NOTE — Progress Notes (Signed)
Subjective:    Patient ID: Scott Gomez, male    DOB: [birth year], 82 y.o.   MRN: [id number]  HPI Here for health maintenance exam and to review chronic medical problems    Had AMW on 3/22 Failed hearing screen both sides  He states it does not bother him or affect his life-declines hearing aide  Mini cog 18/20-unable to follow 2 of a 3 step command (suspect due to not hearing well)  Memory- no problems at all per pt (3 word recall is ok)  Able to keep up with everything at home    Knee problems - getting shots that help  Wt Readings from Last 3 Encounters:  04/10/16 222 lb 8 oz (100.9 kg)  04/04/16 220 lb 4 oz (99.9 kg)  01/09/16 178 lb 12.8 oz (81.1 kg)  diet is ok - he got off track and now doing better since he can get outdoors No exercise when it is cold = now he can get out and do yard work/ wants to plan a garden  He walks short distances (with his knee)  bmi 33.8  utd vaccines  Zoster vaccine -may be interested if affordable  Colonoscopy- 2/00 polyps  Pos ifob 1/15 (he that was due to hemorrhoids)  Declines cologuard or or any other screening   Prostate Lab Results  Component Value Date   PSA 0.95 11/05/2011   PSA 0.69 11/07/2010   PSA 0.76 01/14/2007   no longer screening due to age  No symptoms during the day  He has 1-3 time nocturia if he drinks fluids before bed so he tries not to    Review of Systems    Review of Systems  Constitutional: Negative for fever, appetite change,  and unexpected weight change.  Eyes: Negative for pain and visual disturbance.  Respiratory: Negative for cough and shortness of breath.   Cardiovascular: Negative for cp or palpitations    Gastrointestinal: Negative for nausea, diarrhea and constipation.  Genitourinary: Negative for urgency and frequency.  Skin: Negative for pallor or rash   Neurological: Negative for weakness, light-headedness,  and headaches. pos for leg weakness with foot drop and numbness in foot    Hematological: Negative for adenopathy. Does not bruise/bleed easily.  Psychiatric/Behavioral: Negative for dysphoric mood. The patient is  nervous/anxious.          bp is stable today  No cp or palpitations or headaches or edema  No side effects to medicines  BP Readings from Last 3 Encounters:  04/10/16 132/78  04/04/16 130/90  01/09/16 (!) 150/94     Breathing/ hx of copd Overall about the same- mildly sob when walking but no worse than the past several years   Has neuropathy- L foot worse due to back history On gabapentin  Still suffers from weakness of legs from the same -limits exercise as well   Hx of OP  On fosamax  Followed by the pain clinic  He does not know when he had his last dexa - it is done with the pain clinic   Hx of B12 def Lab Results  Component Value Date   VITAMINB12 1,159 (H) 04/04/2016  also tries to eat balanced diet  Takes otc B12 daily - unsure of dose   Hx of hyperglycemia Lab Results  Component Value Date   HGBA1C 6.1 04/04/2016  diet not great over the winter Eating honeybun cakes and other carbs    Hx of hyperlipidemia  Lab Results  Component Value  Date   CHOL 200 04/04/2016   CHOL 172 10/06/2015   CHOL 188 03/24/2015   Lab Results  Component Value Date   HDL 45.70 04/04/2016   HDL 43.60 10/06/2015   HDL 41.90 03/24/2015   Lab Results  Component Value Date   LDLCALC 129 (H) 04/04/2016   LDLCALC 110 (H) 10/06/2015   LDLCALC 118 (H) 03/24/2015   Lab Results  Component Value Date   TRIG 124.0 04/04/2016   TRIG 90.0 10/06/2015   TRIG 137.0 03/24/2015   Lab Results  Component Value Date   CHOLHDL 4 04/04/2016   CHOLHDL 4 10/06/2015   CHOLHDL 4 03/24/2015   Lab Results  Component Value Date   LDLDIRECT 122.0 03/24/2015   LDLDIRECT 134.0 03/23/2014   LDLDIRECT 149.9 11/25/2012    Eating some sausage  occ eggs - not every day No fried foods  Seldom beef Some ice cream     Chemistry      Component Value  Date/Time   NA 136 04/04/2016 0939   K 4.4 04/04/2016 0939   CL 103 04/04/2016 0939   CO2 26 04/04/2016 0939   BUN 24 (H) 04/04/2016 0939   CREATININE 0.99 04/04/2016 0939      Component Value Date/Time   CALCIUM 10.0 04/04/2016 0939   ALKPHOS 51 04/04/2016 0939   AST 16 04/04/2016 0939   ALT 12 04/04/2016 0939   BILITOT 0.6 04/04/2016 0939      Lab Results  Component Value Date   WBC 5.5 04/04/2016   HGB 14.9 04/04/2016   HCT 44.0 04/04/2016   MCV 85.8 04/04/2016   PLT 219.0 04/04/2016   Lab Results  Component Value Date   TSH 2.38 04/04/2016     Patient Active Problem List   Diagnosis Date Noted  . TMJ (temporomandibular joint disorder) 08/04/2015  . Routine general medical examination at a health care facility 04/09/2015  . Osteoporosis 04/07/2015  . Constipation 05/16/2014  . Rectal bleeding 05/16/2014  . Polyneuropathy ([place]) 03/23/2014  . Encounter for Medicare annual wellness exam 11/25/2012  . Spinal stenosis 08/27/2011  . Vitamin B 12 deficiency 08/06/2011  . Obesity 05/13/2011  . Falls frequently 05/01/2011  . Prostate cancer screening 11/07/2010  . DEPRESSION 02/26/2010  . Essential hypertension, benign 03/29/2008  . COPD (chronic obstructive pulmonary disease) ([place]) 07/08/2007  . Sleep apnea 07/08/2007  . TESTOSTERONE DEFICIENCY 06/22/2007  . Hyperglycemia 01/07/2007  . HYPERCHOLESTEROLEMIA 01/07/2007  . ANXIETY 01/07/2007  . CARPAL TUNNEL SYNDROME 01/07/2007  . PEPTIC ULCER DISEASE 01/07/2007  . OSTEOARTHRITIS, HANDS, BILATERAL 01/07/2007  . SHOULDER PAIN, LEFT, CHRONIC 01/07/2007  . PALPITATIONS, HX OF 01/07/2007  . NEPHROLITHIASIS, HX OF 01/07/2007   Past Medical History:  Diagnosis Date  . Anxiety   . Arthritis    osteoarthritis both hands  . Carpal tunnel syndrome   . Diabetes mellitus    type II  . Fatigue   . Hypercholesteremia   . Hypopotassemia   . Leg cramps   . Nephrolithiasis    Hx of  . Palpitations    Hx of   .  Shoulder pain, left   . Snoring   . Testosterone deficiency   . Ulcer ([place])    peptic ulcer disease   Past Surgical History:  Procedure Laterality Date  . BACK SURGERY  1978   x2 disc   Social History  Substance Use Topics  . Smoking status: Former Smoker    Quit date: 01/14/1993  . Smokeless tobacco: Never Used  .  Alcohol use No   Family History  Problem Relation Age of Onset  . Cancer Brother     kidney  . Heart disease Brother     MI  . Hypertension Mother   . Allergies Mother   . Hypertension Father   . Allergies Father   . Cancer Sister   . Emphysema Sister   . Heart attack Sister    Allergies  Allergen Reactions  . Aspirin     REACTION: GI upset  . Buspirone Hcl     REACTION: itching  . Crestor [Rosuvastatin Calcium]     Increased his blood sugar   . Prednisone     REACTION: sick, per wife he can take this.   Current Outpatient Prescriptions on File Prior to Visit  Medication Sig Dispense Refill  . alendronate (FOSAMAX) 70 MG tablet Take 70 mg by mouth once a week. Take with a full glass of water on an empty stomach.    . ALPRAZolam (XANAX) 0.5 MG tablet TAKE ONE (1) TABLET BY MOUTH TWO (2) TIMES DAILY AS NEEDED FOR ANXIETY 60 tablet 0  . Blood Glucose Monitoring Suppl (ONETOUCH VERIO IQ SYSTEM) W/DEVICE KIT Use to test blood sugar once daily and as needed dx 790.29 1 kit 0  . cyclobenzaprine (FLEXERIL) 10 MG tablet Take 10 mg by mouth 3 (three) times daily as needed. For muscle spasms     . docusate sodium (COLACE) 100 MG capsule Take 100 mg by mouth daily.    Marland Kitchen gabapentin (NEURONTIN) 100 MG capsule Take 200 mg by mouth 3 (three) times daily.     . ONE TOUCH LANCETS MISC Use to test blood sugar once daily and as needed. Dx 790.29 200 each 1  . ONETOUCH VERIO test strip USE TO CHECK BLOOD GLUCOSE EVERY DAY ANDAS NEEDED 100 each 1  . Oxycodone HCl 10 MG TABS Take 1 tablet by mouth 2 (two) times daily as needed (pain).     Marland Kitchen travoprost, benzalkonium, (TRAVATAN)  0.004 % ophthalmic solution Place 1 drop into both eyes at bedtime.       No current facility-administered medications on file prior to visit.      Objective:   Physical Exam  Constitutional: He appears well-developed and well-nourished. No distress.  obese and well appearing   HENT:  Head: Normocephalic and atraumatic.  Right Ear: External ear normal.  Left Ear: External ear normal.  Nose: Nose normal.  Mouth/Throat: Oropharynx is clear and moist.  Hard of hearing  Eyes: Conjunctivae and EOM are normal. Pupils are equal, round, and reactive to light. Right eye exhibits no discharge. Left eye exhibits no discharge. No scleral icterus.  Neck: Normal range of motion. Neck supple. No JVD present. Carotid bruit is not present. No thyromegaly present.  Cardiovascular: Normal rate, regular rhythm, normal heart sounds and intact distal pulses.  Exam reveals no gallop.   Pulmonary/Chest: Effort normal and breath sounds normal. No respiratory distress. He has no wheezes. He exhibits no tenderness.  Diffusely distant bs No crackles   Abdominal: Soft. Bowel sounds are normal. He exhibits no distension, no abdominal bruit and no mass. There is no tenderness.  Musculoskeletal: He exhibits no edema or tenderness.  Foot drop noted-not wearing his AFO for exam  Lymphadenopathy:    He has no cervical adenopathy.  Neurological: He is alert. He has normal reflexes. No cranial nerve deficit. He exhibits normal muscle tone. Coordination normal.  Skin: Skin is warm and dry. No rash noted. No  erythema. No pallor.  Lentigines and solar aging with sks and some aks  Psychiatric: He has a normal mood and affect.          Assessment & Plan:   Problem List Items Addressed This Visit      Cardiovascular and Mediastinum   Essential hypertension, benign - Primary    bp in fair control at this time  BP Readings from Last 1 Encounters:  04/10/16 132/78   No changes needed Disc lifstyle change with low  sodium diet and exercise  Labs reviewed       Relevant Medications   lisinopril (PRINIVIL,ZESTRIL) 5 MG tablet     Respiratory   COPD (chronic obstructive pulmonary disease) (HCC)    Overall stable  slt sob on exertion which is his baseline        Nervous and Auditory   Polyneuropathy (HCC)    No changes in this or foot drop Continue gabapentin  Also AFO and fall precautions        Musculoskeletal and Integument   Osteoporosis    On fosamax and ca and D This is managed by the pain clinic No falls or fractures He is unsure when last dexa was         Other   HYPERCHOLESTEROLEMIA    LDL is up  Disc goals for lipids and reasons to control them Rev labs with pt Rev low sat fat diet in detail Made rec for some diet changes and will monitor      Relevant Medications   lisinopril (PRINIVIL,ZESTRIL) 5 MG tablet   Hyperglycemia    Lab Results  Component Value Date   HGBA1C 6.1 04/04/2016   Up a little  Bad diet over the winter  disc imp of low glycemic diet and wt loss to prevent DM2       Obesity    Discussed how this problem influences overall health and the risks it imposes  Reviewed plan for weight loss with lower calorie diet (via better food choices and also portion control or program like weight watchers) and exercise building up to or more than 30 minutes 5 days per week including some aerobic activity         Routine general medical examination at a health care facility    Reviewed health habits including diet and exercise and skin cancer prevention Reviewed appropriate screening tests for age  Also reviewed health mt list, fam hx and immunization status , as well as social and family history   See HPI AMW reviewed -failed hearing screen but he declines hearing aides  Mini cog -missed some instructions-suspect due to hearing - no memory issues per pt  Disc zoster vaccine -will check into coverage Declines colon cancer screening of any type  Labs  rev Wt loss recommended        Vitamin B 12 deficiency    Lab Results  Component Value Date   VITAMINB12 1,159 (H) 04/04/2016   Gave him rec to dec otc dosing from 1 daily to every other day

## 2016-04-12 NOTE — Assessment & Plan Note (Signed)
Discussed how this problem influences overall health and the risks it imposes  Reviewed plan for weight loss with lower calorie diet (via better food choices and also portion control or program like weight watchers) and exercise building up to or more than 30 minutes 5 days per week including some aerobic activity    

## 2016-04-12 NOTE — Assessment & Plan Note (Signed)
Reviewed health habits including diet and exercise and skin cancer prevention Reviewed appropriate screening tests for age  Also reviewed health mt list, fam hx and immunization status , as well as social and family history   See HPI AMW reviewed -failed hearing screen but he declines hearing aides  Mini cog -missed some instructions-suspect due to hearing - no memory issues per pt  Disc zoster vaccine -will check into coverage Declines colon cancer screening of any type  Labs rev Wt loss recommended

## 2016-04-12 NOTE — Assessment & Plan Note (Signed)
Overall stable  slt sob on exertion which is his baseline

## 2016-04-12 NOTE — Assessment & Plan Note (Signed)
No changes in this or foot drop Continue gabapentin  Also AFO and fall precautions

## 2016-04-12 NOTE — Assessment & Plan Note (Signed)
Lab Results  Component Value Date   VITAMINB12 1,159 (H) 04/04/2016   Gave him rec to dec otc dosing from 1 daily to every other day

## 2016-04-12 NOTE — Assessment & Plan Note (Signed)
Lab Results  Component Value Date   HGBA1C 6.1 04/04/2016   Up a little  Bad diet over the winter  disc imp of low glycemic diet and wt loss to prevent DM2

## 2016-04-12 NOTE — Assessment & Plan Note (Signed)
bp in fair control at this time  BP Readings from Last 1 Encounters:  04/10/16 132/78   No changes needed Disc lifstyle change with low sodium diet and exercise  Labs reviewed

## 2016-04-12 NOTE — Assessment & Plan Note (Signed)
LDL is up  Disc goals for lipids and reasons to control them Rev labs with pt Rev low sat fat diet in detail Made rec for some diet changes and will monitor

## 2016-04-12 NOTE — Assessment & Plan Note (Signed)
On fosamax and ca and D This is managed by the pain clinic No falls or fractures He is unsure when last dexa was

## 2016-04-18 ENCOUNTER — Other Ambulatory Visit: Payer: Self-pay | Admitting: Family Medicine

## 2016-04-18 NOTE — Telephone Encounter (Signed)
Px written for call in   

## 2016-04-18 NOTE — Telephone Encounter (Signed)
CPE was on 04/10/16, and has next years scheduled too. Last filled on 03/19/16 #60 tabs with 0 refills, please advise

## 2016-04-19 ENCOUNTER — Other Ambulatory Visit: Payer: Self-pay | Admitting: Family Medicine

## 2016-04-19 NOTE — Telephone Encounter (Signed)
Rx called in as prescribed 

## 2016-04-19 NOTE — Telephone Encounter (Signed)
Mrs Vosler left v/m requesting status of xanax refill to [facility]. I cb and spoke with Mr Ciccarelli who said he talked to pharmacy and there was no refill there. I called [facility] and rx is ready for pick up. Pt voiced understanding.

## 2016-08-20 ENCOUNTER — Other Ambulatory Visit: Payer: Self-pay | Admitting: Family Medicine

## 2016-08-20 NOTE — Telephone Encounter (Signed)
Rx called in as prescribed 

## 2016-08-20 NOTE — Telephone Encounter (Signed)
Px written for call in   

## 2016-08-20 NOTE — Telephone Encounter (Signed)
Received refill electronically for Alprazolam Last refill 04/18/16 #60/3 Last office visit 3/28/1/8

## 2016-10-14 ENCOUNTER — Other Ambulatory Visit: Payer: Self-pay

## 2016-10-14 MED ORDER — GLUCOSE BLOOD VI STRP: ORAL_STRIP | 1 refills | Status: DC

## 2016-10-14 MED ORDER — ONETOUCH VERIO IQ SYSTEM W/DEVICE KIT: PACK | 0 refills | Status: AC

## 2016-10-14 NOTE — Telephone Encounter (Signed)
Scott Gomez left v/m requesting refill onetouch verio to [facility]. When I spoke with pt he said his meter is not working and request new onetouch verio meter. Advised will send to [place] now. Pt was seen 04/10/16 for annual exam.

## 2016-11-19 ENCOUNTER — Ambulatory Visit (INDEPENDENT_AMBULATORY_CARE_PROVIDER_SITE_OTHER): Payer: Medicare Other

## 2016-11-19 DIAGNOSIS — Z23 Encounter for immunization: Secondary | ICD-10-CM | POA: Diagnosis not present

## 2016-12-13 ENCOUNTER — Other Ambulatory Visit: Payer: Self-pay | Admitting: Family Medicine

## 2016-12-13 NOTE — Telephone Encounter (Signed)
He was given 60 with 3 refills so that should cover 4 months, right?   If so-please check in with him to see if he is taking more than he was

## 2016-12-13 NOTE — Telephone Encounter (Signed)
Called pharmacy and last Rx was filled on 11/21/16 so it is very early, called pt and he said he didn't initiate the refill request he still has med left and will re request refill closer to refill date, Rx declined

## 2016-12-13 NOTE — Telephone Encounter (Signed)
?   If to early, last filled on 08/20/16 #60 tabs with 3 additional refills, please advise

## 2016-12-21 ENCOUNTER — Other Ambulatory Visit: Payer: Self-pay | Admitting: Family Medicine

## 2016-12-25 NOTE — Telephone Encounter (Signed)
Rx called in as prescribed 

## 2016-12-25 NOTE — Telephone Encounter (Signed)
CPE scheduled for 04/14/17, last filled on 08/20/16 #60 tabs with 3 additional refills, please advise

## 2016-12-25 NOTE — Telephone Encounter (Signed)
Px written for call in   

## 2017-01-28 ENCOUNTER — Encounter: Payer: Self-pay | Admitting: Internal Medicine

## 2017-01-28 ENCOUNTER — Ambulatory Visit (INDEPENDENT_AMBULATORY_CARE_PROVIDER_SITE_OTHER)
Admission: RE | Admit: 2017-01-28 | Discharge: 2017-01-28 | Disposition: A | Discharge status: Home / Self Care | Payer: Medicare Other | Source: Ambulatory Visit | Attending: Internal Medicine | Admitting: Internal Medicine

## 2017-01-28 ENCOUNTER — Ambulatory Visit (INDEPENDENT_AMBULATORY_CARE_PROVIDER_SITE_OTHER): Payer: Medicare Other | Admitting: Internal Medicine

## 2017-01-28 VITALS — BP 136/82 | HR 88 | Temp 98.5°F | Wt 222.0 lb

## 2017-01-28 DIAGNOSIS — R0602 Shortness of breath: Secondary | ICD-10-CM | POA: Diagnosis not present

## 2017-01-28 DIAGNOSIS — J449 Chronic obstructive pulmonary disease, unspecified: Secondary | ICD-10-CM

## 2017-01-28 DIAGNOSIS — R059 Cough, unspecified: Secondary | ICD-10-CM

## 2017-01-28 DIAGNOSIS — R05 Cough: Secondary | ICD-10-CM

## 2017-01-28 MED ORDER — HYDROCOD POLST-CPM POLST ER 10-8 MG/5ML PO SUER: 5.0000 mL | Freq: Every evening | ORAL | 0 refills | Status: DC | PRN

## 2017-01-28 MED ORDER — ALBUTEROL SULFATE (2.5 MG/3ML) 0.083% IN NEBU
2.5000 mg | INHALATION_SOLUTION | Freq: Once | RESPIRATORY_TRACT | Status: AC
Start: 2017-01-28 — End: 2017-01-28
  Administered 2017-01-28: 2.5 mg via RESPIRATORY_TRACT

## 2017-01-28 MED ORDER — ALBUTEROL SULFATE HFA 108 (90 BASE) MCG/ACT IN AERS: 2.0000 | INHALATION_SPRAY | Freq: Four times a day (QID) | RESPIRATORY_TRACT | 0 refills | Status: DC | PRN

## 2017-01-28 NOTE — Progress Notes (Signed)
HPI  Pt presents to the clinic today with c/o headache, runny nose, sore throat and cough. This started 3-4 days ago. He is blowing yellow mucous out of his nose. He denies difficulty swallowing. The cough is productive of yellow mucous. He is mildly short of breath but denies chest pain. He denies fever, chills or body aches. He has taken Tylenol Cold with minimal relief. He has a history of COPD, DM 2. He has not had sick contacts.  Review of Systems      Past Medical History:  Diagnosis Date  . Anxiety   . Arthritis    osteoarthritis both hands  . Carpal tunnel syndrome   . Diabetes mellitus    type II  . Fatigue   . Hypercholesteremia   . Hypopotassemia   . Leg cramps   . Nephrolithiasis    Hx of  . Palpitations    Hx of   . Shoulder pain, left   . Snoring   . Testosterone deficiency   . Ulcer    peptic ulcer disease    Family History  Problem Relation Age of Onset  . Cancer Brother        kidney  . Heart disease Brother        MI  . Hypertension Mother   . Allergies Mother   . Hypertension Father   . Allergies Father   . Cancer Sister   . Emphysema Sister   . Heart attack Sister     Social History   Socioeconomic History  . Marital status: Married    Spouse name: Not on file  . Number of children: Not on file  . Years of education: Not on file  . Highest education level: Not on file  Social Needs  . Financial resource strain: Not on file  . Food insecurity - worry: Not on file  . Food insecurity - inability: Not on file  . Transportation needs - medical: Not on file  . Transportation needs - non-medical: Not on file  Occupational History  . Not on file  Tobacco Use  . Smoking status: Former Smoker    Last attempt to quit: 01/14/1993    Years since quitting: 24.0  . Smokeless tobacco: Never Used  Substance and Sexual Activity  . Alcohol use: No    Alcohol/week: 0.0 oz  . Drug use: No  . Sexual activity: No  Other Topics Concern  . Not on file   Social History Narrative   Lives with wife Lavalle Skoda   Retired    Allergies  Allergen Reactions  . Aspirin     REACTION: GI upset  . Buspirone Hcl     REACTION: itching  . Crestor [Rosuvastatin Calcium]     Increased his blood sugar   . Prednisone     REACTION: sick, per wife he can take this.     Constitutional: Positive headache. Denies fatigue, fever or abrupt weight changes.  HEENT:  Positive runny nose, sore throat. Denies eye redness, eye pain, pressure behind the eyes, facial pain, nasal congestion, ear pain, ringing in the ears, wax buildup, or bloody nose. Respiratory: Positive cough and shortness of breath. Denies difficulty breathing.  Cardiovascular: Denies chest pain, chest tightness, palpitations or swelling in the hands or feet.   No other specific complaints in a complete review of systems (except as listed in HPI above).  Objective:   BP 136/82   Pulse 88   Temp 98.5 F (36.9 C) (Oral)  Wt 222 lb (100.7 kg)   SpO2 97%   BMI 33.75 kg/m  Wt Readings from Last 3 Encounters:  01/28/17 222 lb (100.7 kg)  04/10/16 222 lb 8 oz (100.9 kg)  04/04/16 220 lb 4 oz (99.9 kg)     General: Appears his stated age, in NAD. HEENT: Head: normal shape and size, no sinus tenderness noted; Nose: mucosa pink and moist, septum midline; Throat/Mouth: + PND. Teeth present, mucosa pink and moist, no exudate noted, no lesions or ulcerations noted.  Neck: No cervical lymphadenopathy.   Pulmonary/Chest: Normal effort and positive vesicular breath sounds. No respiratory distress. No wheezes, rales or ronchi noted.       Assessment & Plan:   Cough, SOB, COPD:  Likely viral URI with Cough Exam benign Albuterol neb in office per his reqeust, no wheezing noted on exam eRx for Albuterol inhaler. Start Claritin OTC Chest xray today, r/o [facility]NA eRx for Tussionex cough syrup  Will follow up after chest xray, return/ER precautions given   Webb Silversmith, NP

## 2017-01-28 NOTE — Patient Instructions (Signed)

## 2017-04-10 ENCOUNTER — Telehealth: Payer: Self-pay | Admitting: Family Medicine

## 2017-04-10 DIAGNOSIS — E538 Deficiency of other specified B group vitamins: Secondary | ICD-10-CM

## 2017-04-10 DIAGNOSIS — E7800 Pure hypercholesterolemia, unspecified: Secondary | ICD-10-CM

## 2017-04-10 DIAGNOSIS — I10 Essential (primary) hypertension: Secondary | ICD-10-CM

## 2017-04-10 DIAGNOSIS — M810 Age-related osteoporosis without current pathological fracture: Secondary | ICD-10-CM

## 2017-04-10 DIAGNOSIS — R739 Hyperglycemia, unspecified: Secondary | ICD-10-CM

## 2017-04-10 NOTE — Telephone Encounter (Signed)
-----   Message from Eustace Pen, LPN sent at 04/09/7122  4:31 PM EDT ----- Regarding: Labs 3/29 Lab orders needed. Thank you.  Insurance:  [facility]

## 2017-04-11 ENCOUNTER — Ambulatory Visit: Payer: Medicare Other

## 2017-04-11 ENCOUNTER — Ambulatory Visit (INDEPENDENT_AMBULATORY_CARE_PROVIDER_SITE_OTHER): Payer: Medicare Other

## 2017-04-11 VITALS — BP 152/90 | HR 71 | Temp 98.3°F | Ht 68.75 in | Wt 221.0 lb

## 2017-04-11 DIAGNOSIS — E538 Deficiency of other specified B group vitamins: Secondary | ICD-10-CM

## 2017-04-11 DIAGNOSIS — Z0000 Encounter for general adult medical examination without abnormal findings: Secondary | ICD-10-CM

## 2017-04-11 DIAGNOSIS — M810 Age-related osteoporosis without current pathological fracture: Secondary | ICD-10-CM | POA: Diagnosis not present

## 2017-04-11 DIAGNOSIS — R739 Hyperglycemia, unspecified: Secondary | ICD-10-CM

## 2017-04-11 DIAGNOSIS — I10 Essential (primary) hypertension: Secondary | ICD-10-CM

## 2017-04-11 DIAGNOSIS — E7800 Pure hypercholesterolemia, unspecified: Secondary | ICD-10-CM

## 2017-04-11 LAB — VITAMIN B12: Vitamin B-12: 887 pg/mL (ref 211–911)

## 2017-04-11 LAB — COMPREHENSIVE METABOLIC PANEL
ALBUMIN: 4 g/dL (ref 3.5–5.2)
ALT: 10 U/L (ref 0–53)
AST: 14 U/L (ref 0–37)
Alkaline Phosphatase: 52 U/L (ref 39–117)
BUN: 16 mg/dL (ref 6–23)
CHLORIDE: 104 meq/L (ref 96–112)
CO2: 27 mEq/L (ref 19–32)
Calcium: 9.2 mg/dL (ref 8.4–10.5)
Creatinine, Ser: 0.81 mg/dL (ref 0.40–1.50)
GFR: 97.21 mL/min (ref >60.00)
Glucose, Bld: 138 mg/dL — ABNORMAL HIGH (ref 70–99)
POTASSIUM: 4.3 meq/L (ref 3.5–5.1)
SODIUM: 137 meq/L (ref 135–145)
Total Bilirubin: 0.6 mg/dL (ref 0.2–1.2)
Total Protein: 7.2 g/dL (ref 6.0–8.3)

## 2017-04-11 LAB — LIPID PANEL
CHOL/HDL RATIO: 4
Cholesterol: 171 mg/dL (ref 0–200)
HDL: 41.8 mg/dL (ref >39.00)
LDL CALC: 107 mg/dL — AB (ref 0–99)
NONHDL: 129.11
TRIGLYCERIDES: 110 mg/dL (ref 0.0–149.0)
VLDL: 22 mg/dL (ref 0.0–40.0)

## 2017-04-11 LAB — CBC WITH DIFFERENTIAL/PLATELET
BASOS PCT: 0.9 % (ref 0.0–3.0)
Basophils Absolute: 0.1 10*3/uL (ref 0.0–0.1)
EOS PCT: 1.9 % (ref 0.0–5.0)
Eosinophils Absolute: 0.1 10*3/uL (ref 0.0–0.7)
HCT: 39.4 % (ref 39.0–52.0)
HEMOGLOBIN: 13.5 g/dL (ref 13.0–17.0)
Lymphocytes Relative: 24.8 % (ref 12.0–46.0)
Lymphs Abs: 1.4 10*3/uL (ref 0.7–4.0)
MCHC: 34.2 g/dL (ref 30.0–36.0)
MCV: 83.3 fl (ref 78.0–100.0)
MONO ABS: 0.5 10*3/uL (ref 0.1–1.0)
Monocytes Relative: 9.7 % (ref 3.0–12.0)
Neutro Abs: 3.5 10*3/uL (ref 1.4–7.7)
Neutrophils Relative %: 62.7 % (ref 43.0–77.0)
Platelets: 194 10*3/uL (ref 150.0–400.0)
RBC: 4.73 Mil/uL (ref 4.22–5.81)
RDW: 14.4 % (ref 11.5–15.5)
WBC: 5.6 10*3/uL (ref 4.0–10.5)

## 2017-04-11 LAB — HEMOGLOBIN A1C: HEMOGLOBIN A1C: 6.6 % — AB (ref 4.6–6.5)

## 2017-04-11 LAB — VITAMIN D 25 HYDROXY (VIT D DEFICIENCY, FRACTURES): VITD: 41.75 ng/mL (ref 30.00–100.00)

## 2017-04-11 LAB — TSH: TSH: 1.03 u[IU]/mL (ref 0.35–4.50)

## 2017-04-11 NOTE — Progress Notes (Signed)
PCP notes:   Health maintenance:  No gaps identified.  Abnormal screenings:   Hearing - failed  Hearing Screening   125Hz  250Hz  500Hz  1000Hz  2000Hz  3000Hz  4000Hz  6000Hz  8000Hz   Right ear:   40 40 40  0    Left ear:   40 0 40  0     Mini-Cog score: 17/20 MMSE - Mini Mental State Exam 04/11/2017 04/04/2016 03/24/2015  Orientation to time 5 5 5   Orientation to Place 5 5 5   Registration 3 3 3   Attention/ Calculation 0 0 0  Recall 3 3 3   Language- name 2 objects 0 0 0  Language- repeat 1 1 1   Language- follow 3 step command 0 1 3  Language- follow 3 step command-comments pt refused to do clock drawing and was unable to verbalize where the clock hands would point at a certain time pt was unable to follow 2 steps of 3 step command -  Language- read & follow direction 0 0 1  Write a sentence 0 0 0  Copy design 0 0 0  Total score 17 18 21     Patient concerns:   None  Nurse concerns:  None  Next PCP appt:   04/14/17 @ 1130  I reviewed health advisor's note, was available for consultation, and agree with documentation and plan. Loura Pardon MD

## 2017-04-11 NOTE — Progress Notes (Signed)
Subjective:   HENRY UTSEY is a 83 y.o. male who presents for Medicare Annual/Subsequent preventive examination.  Review of Systems:  N/A Cardiac Risk Factors include: advanced age (>39mn, >>78women);male gender;obesity (BMI >30kg/m2)     Objective:    Vitals: BP (!) 152/90 (BP Location: Right Arm, Patient Position: Sitting, Cuff Size: Normal)   Pulse 71   Temp 98.3 F (36.8 C) (Oral)   Ht 5' 8.75" (1.746 m) Comment: shoes  Wt 221 lb (100.2 kg)   SpO2 96%   BMI 32.87 kg/m   Body mass index is 32.87 kg/m.  Advanced Directives 04/11/2017 04/04/2016 03/24/2015 01/01/2011  Does Patient Have a Medical Advance Directive? No No No Patient does not have advance directive  Would patient like information on creating a medical advance directive? No - Patient declined No - Patient declined Yes - Educational materials given -  Pre-existing out of facility DNR order (yellow form or pink MOST form) - - - No    Tobacco Social History   Tobacco Use  Smoking Status Former Smoker  . Last attempt to quit: 01/14/1993  . Years since quitting: 24.2  Smokeless Tobacco Never Used     Counseling given: No   Clinical Intake:  Pre-visit preparation completed: Yes  Pain : No/denies pain Pain Score: 0-No pain     Nutritional Status: BMI > 30  Obese Nutritional Risks: None Diabetes: No  How often do you need to have someone help you when you read instructions, pamphlets, or other written materials from your doctor or pharmacy?: 1 - Never What is the last grade level you completed in school?: 7th grade  Interpreter Needed?: No  Comments: pt lives with spouse Information entered by :: LPinson, LPN  Past Medical History:  Diagnosis Date  . Anxiety   . Arthritis    osteoarthritis both hands  . Carpal tunnel syndrome   . Diabetes mellitus    type II  . Fatigue   . Hypercholesteremia   . Hypopotassemia   . Leg cramps   . Nephrolithiasis    Hx of  . Palpitations    Hx of   .  Shoulder pain, left   . Snoring   . Testosterone deficiency   . Ulcer    peptic ulcer disease   Past Surgical History:  Procedure Laterality Date  . BACK SURGERY  1978   x2 disc   Family History  Problem Relation Age of Onset  . Cancer Brother        kidney  . Heart disease Brother        MI  . Hypertension Mother   . Allergies Mother   . Hypertension Father   . Allergies Father   . Cancer Sister   . Emphysema Sister   . Heart attack Sister    Social History   Socioeconomic History  . Marital status: Married    Spouse name: Not on file  . Number of children: Not on file  . Years of education: Not on file  . Highest education level: Not on file  Occupational History  . Not on file  Social Needs  . Financial resource strain: Not on file  . Food insecurity:    Worry: Not on file    Inability: Not on file  . Transportation needs:    Medical: Not on file    Non-medical: Not on file  Tobacco Use  . Smoking status: Former Smoker    Last attempt to quit: 01/14/1993  Years since quitting: 24.2  . Smokeless tobacco: Never Used  Substance and Sexual Activity  . Alcohol use: No    Alcohol/week: 0.0 oz  . Drug use: No  . Sexual activity: Never  Lifestyle  . Physical activity:    Days per week: Not on file    Minutes per session: Not on file  . Stress: Not on file  Relationships  . Social connections:    Talks on phone: Not on file    Gets together: Not on file    Attends religious service: Not on file    Active member of club or organization: Not on file    Attends meetings of clubs or organizations: Not on file    Relationship status: Not on file  Other Topics Concern  . Not on file  Social History Narrative   Lives with wife Esaiah Wanless   Retired    Outpatient Encounter Medications as of 04/11/2017  Medication Sig  . alendronate (FOSAMAX) 70 MG tablet Take 70 mg by mouth once a week. Take with a full glass of water on an empty stomach.  .  ALPRAZolam (XANAX) 0.5 MG tablet TAKE ONE TABLET BY MOUTH TWICE DAILY AS NEEDED FOR ANXIETY  . Blood Glucose Monitoring Suppl (ONETOUCH VERIO IQ SYSTEM) w/Device KIT Use to test blood sugar once daily and as needed dx R73.9  . chlorpheniramine-HYDROcodone (TUSSIONEX PENNKINETIC ER) 10-8 MG/5ML SUER Take 5 mLs by mouth at bedtime as needed.  . cyclobenzaprine (FLEXERIL) 10 MG tablet Take 10 mg by mouth 3 (three) times daily as needed. For muscle spasms   . docusate sodium (COLACE) 100 MG capsule Take 100 mg by mouth daily.  Marland Kitchen gabapentin (NEURONTIN) 100 MG capsule Take 200 mg by mouth 3 (three) times daily.   Marland Kitchen glucose blood (ONETOUCH VERIO) test strip USE TO CHECK BLOOD GLUCOSE EVERY DAY ANDAS NEEDED Dx R73.9  . lisinopril (PRINIVIL,ZESTRIL) 5 MG tablet TAKE ONE (1) TABLET BY MOUTH EVERY DAY  . ONE TOUCH LANCETS MISC Use to test blood sugar once daily and as needed. Dx 790.29  . Oxycodone HCl 10 MG TABS Take 1 tablet by mouth 2 (two) times daily as needed (pain).   . potassium chloride SA (K-DUR,KLOR-CON) 20 MEQ tablet TAKE ONE (1) TABLET BY MOUTH EVERY DAY  . travoprost, benzalkonium, (TRAVATAN) 0.004 % ophthalmic solution Place 1 drop into both eyes at bedtime.    . [DISCONTINUED] albuterol (PROVENTIL HFA;VENTOLIN HFA) 108 (90 Base) MCG/ACT inhaler Inhale 2 puffs into the lungs every 6 (six) hours as needed for wheezing or shortness of breath.   No facility-administered encounter medications on file as of 04/11/2017.     Activities of Daily Living In your present state of health, do you have any difficulty performing the following activities: 04/11/2017  Hearing? N  Vision? N  Difficulty concentrating or making decisions? N  Walking or climbing stairs? Y  Dressing or bathing? N  Doing errands, shopping? N  Preparing Food and eating ? N  Using the Toilet? N  In the past six months, have you accidently leaked urine? N  Do you have problems with loss of bowel control? N  Managing your  Medications? N  Managing your Finances? N  Housekeeping or managing your Housekeeping? N  Some recent data might be hidden    Patient Care Team: [name], [name], MD as PCP - General Calvert Cantor, MD as Consulting Physician (Ophthalmology) Berle Mull, MD as Consulting Physician (Sports Medicine) Margaretha Sheffield, MD as  Referring Physician (Physical Medicine and Rehabilitation)   Assessment:   This is a routine wellness examination for [place].  Exercise Activities and Dietary recommendations Current Exercise Habits: The patient does not participate in regular exercise at present, Exercise limited by: orthopedic condition(s)  Goals    . Increase physical activity     When weather permits, I will continue to walk or do yard work at least 30 min 3-5 days a week.        Fall Risk Fall Risk  04/11/2017 04/04/2016 03/24/2015 03/23/2014 11/25/2012  Falls in the past year? No No No Yes Yes  Number falls in past yr: - - - 2 or more 2 or more  Injury with Fall? - - - Yes -  Risk Factor Category  - - - - High Fall Risk  Risk for fall due to : - - - - Impaired mobility;Impaired balance/gait  Risk for fall due to: Comment - - - - -    Depression Screen PHQ 2/9 Scores 04/11/2017 04/04/2016 03/24/2015 03/23/2014  PHQ - 2 Score 0 0 0 0  PHQ- 9 Score 0 - - -    Cognitive Function MMSE - Mini Mental State Exam 04/11/2017 04/04/2016 03/24/2015  Orientation to time _0 Orientation to Place _1 Registration _2 Attention/ Calculation 0 0 0  Recall _3 Language- name 2 objects 0 0 0  Language- repeat _4 Language- follow 3 step command 0 1 3  Language- follow 3 step command-comments pt refused to do clock drawing and was unable to verbalize where the clock hands would point at a certain time pt was unable to follow 2 steps of 3 step command -  Language- read & follow direction 0 0 1  Write a sentence 0 0 0  Copy design 0 0 0  Total score _5 PLEASE NOTE: A Mini-Cog screen  was completed. Maximum score is 20. A value of 0 denotes this part of Folstein MMSE was not completed or the patient failed this part of the Mini-Cog screening.   Mini-Cog Screening Orientation to Time - Max 5 pts Orientation to Place - Max 5 pts Registration - Max 3 pts Recall - Max 3 pts Language Repeat - Max 1 pts Language Follow 3 Step Command - Max 3 pts     Immunization History  Administered Date(s) Administered  . Influenza Split 11/12/2010, 11/12/2011  . Influenza,inj,Quad PF,6+ Mos 11/11/2013, 03/17/2015, 10/13/2015, 11/19/2016  . Influenza-Unspecified 10/06/2012  . Pneumococcal Conjugate-13 03/23/2014  . Pneumococcal Polysaccharide-23 02/21/2009  . Td 08/21/2009    Screening Tests Health Maintenance  Topic Date Due  . TETANUS/TDAP  08/22/2019  . INFLUENZA VACCINE  Completed  . PNA vac Low Risk Adult  Completed      Plan:    I have personally reviewed, addressed, and noted the following in the patient's chart:  A. Medical and social history B. Use of alcohol, tobacco or illicit drugs  C. Current medications and supplements D. Functional ability and status E.  Nutritional status F.  Physical activity G. Advance directives H. List of other physicians I.  Hospitalizations, surgeries, and ER visits in previous 12 months J.  [place] to include hearing, vision, cognitive, depression L. Referrals and appointments - none  In addition, I have reviewed and discussed with patient certain preventive protocols, quality metrics, and best practice recommendations. A written personalized care plan  for preventive services as well as general preventive health recommendations were provided to patient.  See attached scanned questionnaire for additional information.   Signed,   Lindell Noe, MHA, BS, LPN Health Coach

## 2017-04-11 NOTE — Patient Instructions (Signed)
Scott Gomez , Thank you for taking time to come for your Medicare Wellness Visit. I appreciate your ongoing commitment to your health goals. Please review the following plan we discussed and let me know if I can assist you in the future.   These are the goals we discussed: Goals    . Increase physical activity     When weather permits, I will continue to walk or do yard work at least 30 min 3-5 days a week.        This is a list of the screening recommended for you and due dates:  Health Maintenance  Topic Date Due  . Tetanus Vaccine  08/22/2019  . Flu Shot  Completed  . Pneumonia vaccines  Completed   Preventive Care for Adults  A healthy lifestyle and preventive care can promote health and wellness. Preventive health guidelines for adults include the following key practices.  . A routine yearly physical is a good way to check with your health care provider about your health and preventive screening. It is a chance to share any concerns and updates on your health and to receive a thorough exam.  . Visit your dentist for a routine exam and preventive care every 6 months. Brush your teeth twice a day and floss once a day. Good oral hygiene prevents tooth decay and gum disease.  . The frequency of eye exams is based on your age, health, family medical history, use  of contact lenses, and other factors. Follow your health care provider's recommendations for frequency of eye exams.  . Eat a healthy diet. Foods like vegetables, fruits, whole grains, low-fat dairy products, and lean protein foods contain the nutrients you need without too many calories. Decrease your intake of foods high in solid fats, added sugars, and salt. Eat the right amount of calories for you. Get information about a proper diet from your health care provider, if necessary.  . Regular physical exercise is one of the most important things you can do for your health. Most adults should get at least 150 minutes of  moderate-intensity exercise (any activity that increases your heart rate and causes you to sweat) each week. In addition, most adults need muscle-strengthening exercises on 2 or more days a week.  Silver Sneakers may be a benefit available to you. To determine eligibility, you may visit the website: www.silversneakers.com or contact program at 249-725-4278 Mon-Fri between 8AM-8PM.   . Maintain a healthy weight. The body mass index (BMI) is a screening tool to identify possible weight problems. It provides an estimate of body fat based on height and weight. Your health care provider can find your BMI and can help you achieve or maintain a healthy weight.   For adults 20 years and older: ? A BMI below 18.5 is considered underweight. ? A BMI of 18.5 to 24.9 is normal. ? A BMI of 25 to 29.9 is considered overweight. ? A BMI of 30 and above is considered obese.   . Maintain normal blood lipids and cholesterol levels by exercising and minimizing your intake of saturated fat. Eat a balanced diet with plenty of fruit and vegetables. Blood tests for lipids and cholesterol should begin at age 65 and be repeated every 5 years. If your lipid or cholesterol levels are high, you are over 50, or you are at high risk for heart disease, you may need your cholesterol levels checked more frequently. Ongoing high lipid and cholesterol levels should be treated with medicines if diet  and exercise are not working.  . If you smoke, find out from your health care provider how to quit. If you do not use tobacco, please do not start.  . If you choose to drink alcohol, please do not consume more than 2 drinks per day. One drink is considered to be 12 ounces (355 mL) of beer, 5 ounces (148 mL) of wine, or 1.5 ounces (44 mL) of liquor.  . If you are 59-72 years old, ask your health care provider if you should take aspirin to prevent strokes.  . Use sunscreen. Apply sunscreen liberally and repeatedly throughout the day. You  should seek shade when your shadow is shorter than you. Protect yourself by wearing long sleeves, pants, a wide-brimmed hat, and sunglasses year round, whenever you are outdoors.  . Once a month, do a whole body skin exam, using a mirror to look at the skin on your back. Tell your health care provider of new moles, moles that have irregular borders, moles that are larger than a pencil eraser, or moles that have changed in shape or color.

## 2017-04-14 ENCOUNTER — Ambulatory Visit (INDEPENDENT_AMBULATORY_CARE_PROVIDER_SITE_OTHER): Payer: Medicare Other | Admitting: Family Medicine

## 2017-04-14 ENCOUNTER — Encounter: Payer: Self-pay | Admitting: Family Medicine

## 2017-04-14 VITALS — BP 146/76 | HR 72 | Temp 98.0°F | Ht 68.75 in | Wt 221.5 lb

## 2017-04-14 DIAGNOSIS — E538 Deficiency of other specified B group vitamins: Secondary | ICD-10-CM

## 2017-04-14 DIAGNOSIS — F411 Generalized anxiety disorder: Secondary | ICD-10-CM | POA: Diagnosis not present

## 2017-04-14 DIAGNOSIS — Z6833 Body mass index (BMI) 33.0-33.9, adult: Secondary | ICD-10-CM | POA: Diagnosis not present

## 2017-04-14 DIAGNOSIS — J410 Simple chronic bronchitis: Secondary | ICD-10-CM

## 2017-04-14 DIAGNOSIS — E6609 Other obesity due to excess calories: Secondary | ICD-10-CM | POA: Diagnosis not present

## 2017-04-14 DIAGNOSIS — M48062 Spinal stenosis, lumbar region with neurogenic claudication: Secondary | ICD-10-CM | POA: Diagnosis not present

## 2017-04-14 DIAGNOSIS — G629 Polyneuropathy, unspecified: Secondary | ICD-10-CM | POA: Diagnosis not present

## 2017-04-14 DIAGNOSIS — R7303 Prediabetes: Secondary | ICD-10-CM

## 2017-04-14 DIAGNOSIS — I10 Essential (primary) hypertension: Secondary | ICD-10-CM | POA: Diagnosis not present

## 2017-04-14 DIAGNOSIS — E7800 Pure hypercholesterolemia, unspecified: Secondary | ICD-10-CM | POA: Diagnosis not present

## 2017-04-14 DIAGNOSIS — Z0000 Encounter for general adult medical examination without abnormal findings: Secondary | ICD-10-CM | POA: Diagnosis not present

## 2017-04-14 DIAGNOSIS — M810 Age-related osteoporosis without current pathological fracture: Secondary | ICD-10-CM | POA: Diagnosis not present

## 2017-04-14 MED ORDER — POTASSIUM CHLORIDE CRYS ER 20 MEQ PO TBCR: EXTENDED_RELEASE_TABLET | ORAL | 3 refills | Status: DC

## 2017-04-14 MED ORDER — LISINOPRIL 10 MG PO TABS: 10.0000 mg | ORAL_TABLET | Freq: Every day | ORAL | 3 refills | Status: AC

## 2017-04-14 NOTE — Assessment & Plan Note (Signed)
With leg/foot sequelae  Continues pain clinic f/u  Enc use of his AFOs-which he does not like to do

## 2017-04-14 NOTE — Assessment & Plan Note (Signed)
Lab Results  Component Value Date   HGBA1C 6.6 (H) 04/11/2017   This is up  disc imp of low glycemic diet and wt loss to prevent DM2  Handouts given  F/u 3 mo with lab prior for A1C

## 2017-04-14 NOTE — Assessment & Plan Note (Signed)
From his prior back issues Continues to f/u with pain clinic On gabapentin

## 2017-04-14 NOTE — Patient Instructions (Addendum)
For early diabetes  Try to get most of your carbohydrates from produce (with the exception of white potatoes)  Eat less bread/pasta/rice/snack foods/cereals/sweets and other items from the middle of the grocery store (processed carbs)  Less crackers and snack foods  Try to get 30 minutes of exercise per day A chair exercise video would be good for you since feet and legs limit you  If you cannot get outdoors to exercise then start going to the [employer]  5-10 pound weight loss would help    Increase your blood pressure medicine lisinopril from 5 to 10 mg daily  Your current pills- take 2 pills once daily until gone Then pick up the new 10 mg pill - that will be just one per day   Follow up in 3 months with labs prior

## 2017-04-14 NOTE — Assessment & Plan Note (Signed)
Pt notes slt more sob on exertion-this may also be from deconditioning No cough

## 2017-04-14 NOTE — Assessment & Plan Note (Addendum)
Reviewed health habits including diet and exercise and skin cancer prevention Reviewed appropriate screening tests for age  Also reviewed health mt list, fam hx and immunization status , as well as social and family history   See HPI Labs rev  amw rev  Declines further cognitive w/u- suspect some slow down (also more irritable- ? Anxious)  Refuses to wear sun protection for skin cancer prev at his age Declines dexa or colon cancer screening  Info given re: diet/exercise for wt loss and early DM F/u 3 mo

## 2017-04-14 NOTE — Assessment & Plan Note (Signed)
bp in fair control at this time  BP Readings from Last 1 Encounters:  04/14/17 (!) 146/76   Will increase lisinopril to 10 mg daily from 5 Alert if side effects or problems  Disc lifstyle change with low sodium diet and exercise  Labs reviewed

## 2017-04-14 NOTE — Assessment & Plan Note (Signed)
Discussed how this problem influences overall health and the risks it imposes  Reviewed plan for weight loss with lower calorie diet (via better food choices and also portion control or program like weight watchers) and exercise building up to or more than 30 minutes 5 days per week including some aerobic activity   Aware wt loss would help DM

## 2017-04-14 NOTE — Progress Notes (Signed)
Subjective:    Patient ID: Scott Gomez, male    DOB: 05-Feb-1934, 83 y.o.   MRN: [id number]  HPI  Here for health maintenance exam and to review chronic medical problems    Feeling good overall  Feet/neuropathy continue to bother him Using a crutch as well/ getting injections in his R knee  Does not want a knee replacement   He does not always wear his AFOs -hard to work with    [facility] Readings from Last 3 Encounters:  04/14/17 221 lb 8 oz (100.5 kg)  04/11/17 221 lb (100.2 kg)  01/28/17 222 lb (100.7 kg)  about the same  His diet is fair-watches sugar and fat  No exercise with cold weather and with his leg/foot problems  Out of shape- gets short of breath quicker also  32.95 kg/m    Had amw on 3/29 Hearing -missed several tones L ear/one on R He is not bothered by it and chooses not to get hearing aides  Mini cog 17 - he "refused" to to the clock draw --says the directions were confusing and just did not want to do it  Short term recall was ok - "I can remember pretty good" / concentration  Has not become confused or lost  Wife takes care of the bills  He reads/no problems  Otherwise no concerns  Goes to eye doctor every 6 mo for glaucome   utd imms  Not interested in vaccine for shingles   Colon screen Had polyps on colonoscopy 2000 Had pos ifob 1/15-declined further eval/hx of hemorrhoids  No anemia  Declined colonoscopy or other screening -still declines   Prostate health Lab Results  Component Value Date   PSA 0.95 11/05/2011   PSA 0.69 11/07/2010   PSA 0.76 01/14/2007    No longer checking due to age  No changes in nocturia or urinary habits    bp is stable today  No cp or palpitations or headaches or edema  No side effects to medicines  BP Readings from Last 3 Encounters:  04/14/17 (!) 146/76  04/11/17 (!) 152/90  01/28/17 136/82      Breathing/hx of copd "gives out" walking occasionally -but also out of shape  No wheezing however  No  problems at rest at all   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 -on gabapentin Followed by pain clinic   Hx of OP On fosamax and also followed by pain clinic Still taking that for OP  Has not had a dexa  He declines at this time since he is treated already   B12 def Lab Results  Component Value Date   VITAMINB12 887 04/11/2017   Hyperglycemia Lab Results  Component Value Date   HGBA1C 6.6 (H) 04/11/2017  this is up from 6.1 He avoids added sugar in food/drinks Avoids sweets  Eats popcorn and cheese crackers  When feet burn-he wonders if this is from diabetes  Exercise is limited     Hyperlipidemia Lab Results  Component Value Date   CHOL 171 04/11/2017   CHOL 200 04/04/2016   CHOL 172 10/06/2015   Lab Results  Component Value Date   HDL 41.80 04/11/2017   HDL 45.70 04/04/2016   HDL 43.60 10/06/2015   Lab Results  Component Value Date   LDLCALC 107 (H) 04/11/2017   LDLCALC 129 (H) 04/04/2016   LDLCALC 110 (H) 10/06/2015   Lab Results  Component Value Date   TRIG 110.0 04/11/2017   TRIG 124.0 04/04/2016  TRIG 90.0 10/06/2015   Lab Results  Component Value Date   CHOLHDL 4 04/11/2017   CHOLHDL 4 04/04/2016   CHOLHDL 4 10/06/2015   Lab Results  Component Value Date   LDLDIRECT 122.0 03/24/2015   LDLDIRECT 134.0 03/23/2014   LDLDIRECT 149.9 11/25/2012   Unable to tolerate statins/most recently crestor  Improved w/o cholesterol medicine   Other labs Lab Results  Component Value Date   CREATININE 0.81 04/11/2017   BUN 16 04/11/2017   NA 137 04/11/2017   K 4.3 04/11/2017   CL 104 04/11/2017   CO2 27 04/11/2017   Lab Results  Component Value Date   WBC 5.6 04/11/2017   HGB 13.5 04/11/2017   HCT 39.4 04/11/2017   MCV 83.3 04/11/2017   PLT 194.0 04/11/2017   Lab Results  Component Value Date   ALT 10 04/11/2017   AST 14 04/11/2017   ALKPHOS 52 04/11/2017   BILITOT 0.6 04/11/2017    Lab Results  Component Value Date   TSH 1.03  04/11/2017      Patient Active Problem List   Diagnosis Date Noted  . Routine general medical examination at a health care facility 04/09/2015  . Osteoporosis 04/07/2015  . Constipation 05/16/2014  . Polyneuropathy 03/23/2014  . Encounter for Medicare annual wellness exam 11/25/2012  . Spinal stenosis 08/27/2011  . Vitamin B 12 deficiency 08/06/2011  . Obesity 05/13/2011  . Falls frequently 05/01/2011  . Prostate cancer screening 11/07/2010  . DEPRESSION 02/26/2010  . Essential hypertension, benign 03/29/2008  . COPD (chronic obstructive pulmonary disease) ([place]) 07/08/2007  . Sleep apnea 07/08/2007  . TESTOSTERONE DEFICIENCY 06/22/2007  . Prediabetes 01/07/2007  . HYPERCHOLESTEROLEMIA 01/07/2007  . Generalized anxiety disorder 01/07/2007  . CARPAL TUNNEL SYNDROME 01/07/2007  . PEPTIC ULCER DISEASE 01/07/2007  . OSTEOARTHRITIS, HANDS, BILATERAL 01/07/2007  . SHOULDER PAIN, LEFT, CHRONIC 01/07/2007  . PALPITATIONS, HX OF 01/07/2007  . NEPHROLITHIASIS, HX OF 01/07/2007   Past Medical History:  Diagnosis Date  . Anxiety   . Arthritis    osteoarthritis both hands  . Carpal tunnel syndrome   . Diabetes mellitus    type II  . Fatigue   . Hypercholesteremia   . Hypopotassemia   . Leg cramps   . Nephrolithiasis    Hx of  . Palpitations    Hx of   . Shoulder pain, left   . Snoring   . Testosterone deficiency   . Ulcer    peptic ulcer disease   Past Surgical History:  Procedure Laterality Date  . BACK SURGERY  1978   x2 disc   Social History   Tobacco Use  . Smoking status: Former Smoker    Last attempt to quit: 01/14/1993    Years since quitting: 24.2  . Smokeless tobacco: Never Used  Substance Use Topics  . Alcohol use: No    Alcohol/week: 0.0 oz  . Drug use: No   Family History  Problem Relation Age of Onset  . Cancer Brother        kidney  . Heart disease Brother        MI  . Hypertension Mother   . Allergies Mother   . Hypertension Father   .  Allergies Father   . Cancer Sister   . Emphysema Sister   . Heart attack Sister    Allergies  Allergen Reactions  . Aspirin     REACTION: GI upset  . Buspirone Hcl     REACTION: itching  .  Crestor [Rosuvastatin Calcium]     Increased his blood sugar   . Prednisone     REACTION: sick, per wife he can take this.   Current Outpatient Medications on File Prior to Visit  Medication Sig Dispense Refill  . alendronate (FOSAMAX) 70 MG tablet Take 70 mg by mouth once a week. Take with a full glass of water on an empty stomach.    . ALPRAZolam (XANAX) 0.5 MG tablet TAKE ONE TABLET BY MOUTH TWICE DAILY AS NEEDED FOR ANXIETY 60 tablet 3  . Blood Glucose Monitoring Suppl (ONETOUCH VERIO IQ SYSTEM) w/Device KIT Use to test blood sugar once daily and as needed dx R73.9 1 kit 0  . cyclobenzaprine (FLEXERIL) 10 MG tablet Take 10 mg by mouth 3 (three) times daily as needed. For muscle spasms     . docusate sodium (COLACE) 100 MG capsule Take 100 mg by mouth daily.    Marland Kitchen gabapentin (NEURONTIN) 100 MG capsule Take 200 mg by mouth 3 (three) times daily.     Marland Kitchen glucose blood (ONETOUCH VERIO) test strip USE TO CHECK BLOOD GLUCOSE EVERY DAY ANDAS NEEDED Dx R73.9 100 each 1  . ONE TOUCH LANCETS MISC Use to test blood sugar once daily and as needed. Dx 790.29 200 each 1  . Oxycodone HCl 10 MG TABS Take 1 tablet by mouth 2 (two) times daily as needed (pain).     Marland Kitchen travoprost, benzalkonium, (TRAVATAN) 0.004 % ophthalmic solution Place 1 drop into both eyes at bedtime.       No current facility-administered medications on file prior to visit.     Review of Systems  Constitutional: Negative for activity change, appetite change, fatigue, fever and unexpected weight change.  HENT: Negative for congestion, rhinorrhea, sore throat and trouble swallowing.   Eyes: Negative for pain, redness, itching and visual disturbance.  Respiratory: Negative for cough, chest tightness, shortness of breath and wheezing.     Cardiovascular: Negative for chest pain and palpitations.  Gastrointestinal: Negative for abdominal pain, blood in stool, constipation, diarrhea and nausea.  Endocrine: Negative for cold intolerance, heat intolerance, polydipsia and polyuria.  Genitourinary: Negative for difficulty urinating, dysuria, frequency and urgency.  Musculoskeletal: Positive for arthralgias and back pain. Negative for joint swelling and myalgias.       R knee  Skin: Negative for pallor and rash.       Refuses to use sunscreen   Neurological: Positive for weakness and numbness. Negative for dizziness, tremors and headaches.       Foot and leg tingling and numbness-baseline from back injury  Hematological: Negative for adenopathy. Does not bruise/bleed easily.  Psychiatric/Behavioral: Negative for agitation, behavioral problems, decreased concentration, dysphoric mood and sleep disturbance. The patient is nervous/anxious.        Objective:   Physical Exam  Constitutional: He appears well-developed and well-nourished. No distress.  obese and well appearing   HENT:  Head: Normocephalic and atraumatic.  Right Ear: External ear normal.  Left Ear: External ear normal.  Nose: Nose normal.  Mouth/Throat: Oropharynx is clear and moist.  Eyes: Pupils are equal, round, and reactive to light. Conjunctivae and EOM are normal. Right eye exhibits no discharge. Left eye exhibits no discharge. No scleral icterus.  Neck: Normal range of motion. Neck supple. No JVD present. Carotid bruit is not present. No thyromegaly present.  Cardiovascular: Normal rate, regular rhythm, normal heart sounds and intact distal pulses. Exam reveals no gallop.  Pulmonary/Chest: Effort normal and breath sounds normal. No respiratory  distress. He has no wheezes. He exhibits no tenderness.  Diffusely distant bs  Scant wheeze on forced exp only  Fairly good air exch No crackles No rales or rhonchi  Abdominal: Soft. Bowel sounds are normal. He  exhibits no distension, no abdominal bruit and no mass. There is no tenderness. There is no rebound and no guarding.  Musculoskeletal: He exhibits no edema or tenderness.  Lymphadenopathy:    He has no cervical adenopathy.  Neurological: He is alert. He has normal reflexes. No cranial nerve deficit. He exhibits normal muscle tone. Coordination normal.  Skin: Skin is warm and dry. No rash noted. No erythema. No pallor.  Solar lentigines diffusely Solar aging SKs on trunk and back   Psychiatric: He has a normal mood and affect.  Somewhat irritable today  Attentive  No obvious cognitive defects           Assessment & Plan:   Problem List Items Addressed This Visit      Cardiovascular and Mediastinum   Essential hypertension, benign    bp in fair control at this time  BP Readings from Last 1 Encounters:  04/14/17 (!) 146/76   Will increase lisinopril to 10 mg daily from 5 Alert if side effects or problems  Disc lifstyle change with low sodium diet and exercise  Labs reviewed       Relevant Medications   lisinopril (PRINIVIL,ZESTRIL) 10 MG tablet     Respiratory   COPD (chronic obstructive pulmonary disease) ([facility])    Pt notes slt more sob on exertion-this may also be from deconditioning No cough        Nervous and Auditory   Polyneuropathy    From his prior back issues Continues to f/u with pain clinic On gabapentin         Musculoskeletal and Integument   Osteoporosis    Treated with fosamax (from pain clinic) Pt declines dexa  Disc need for calcium/ vitamin D/ wt bearing exercise and bone density test every 2 y to monitor Disc safety/ fracture risk in detail          Other   Generalized anxiety disorder    Continues tid xanax (for years)  Reviewed stressors/ coping techniques/symptoms/ support sources/ tx options and side effects in detail today  Seems irritable today  Disc cognition - ? If he confuses more easily       HYPERCHOLESTEROLEMIA    Some  improvement in LDL this time Disc goals for lipids and reasons to control them Rev labs with pt Rev low sat fat diet in detail       Relevant Medications   lisinopril (PRINIVIL,ZESTRIL) 10 MG tablet   Obesity    Discussed how this problem influences overall health and the risks it imposes  Reviewed plan for weight loss with lower calorie diet (via better food choices and also portion control or program like weight watchers) and exercise building up to or more than 30 minutes 5 days per week including some aerobic activity   Aware wt loss would help DM      Prediabetes    Lab Results  Component Value Date   HGBA1C 6.6 (H) 04/11/2017   This is up  disc imp of low glycemic diet and wt loss to prevent DM2  Handouts given  F/u 3 mo with lab prior for A1C      Routine general medical examination at a health care facility - Primary    Reviewed health habits including diet  and exercise and skin cancer prevention Reviewed appropriate screening tests for age  Also reviewed health mt list, fam hx and immunization status , as well as social and family history   See HPI Labs rev  amw rev  Declines further cognitive w/u- suspect some slow down (also more irritable- ? Anxious)  Refuses to wear sun protection for skin cancer prev at his age Declines dexa or colon cancer screening  Info given re: diet/exercise for wt loss and early DM F/u 3 mo       Spinal stenosis    With leg/foot sequelae  Continues pain clinic f/u  Enc use of his AFOs-which he does not like to do      Vitamin B 12 deficiency    Well controlled Lab Results  Component Value Date   VITAMINB12 887 04/11/2017

## 2017-04-14 NOTE — Assessment & Plan Note (Signed)
Continues tid xanax (for years)  Reviewed stressors/ coping techniques/symptoms/ support sources/ tx options and side effects in detail today  Seems irritable today  Disc cognition - ? If he confuses more easily

## 2017-04-14 NOTE — Assessment & Plan Note (Signed)
Well controlled Lab Results  Component Value Date   [id number] 04/11/2017

## 2017-04-14 NOTE — Assessment & Plan Note (Signed)
Treated with fosamax (from pain clinic) Pt declines dexa  Disc need for calcium/ vitamin D/ wt bearing exercise and bone density test every 2 y to monitor Disc safety/ fracture risk in detail

## 2017-04-14 NOTE — Assessment & Plan Note (Signed)
Some improvement in LDL this time Disc goals for lipids and reasons to control them Rev labs with pt Rev low sat fat diet in detail

## 2017-04-23 ENCOUNTER — Other Ambulatory Visit: Payer: Self-pay | Admitting: Family Medicine

## 2017-04-23 NOTE — Telephone Encounter (Signed)
F/u scheduled for 07/15/17, xanax last filled on 12/25/16 #60 tabs with 3 refills

## 2017-07-10 ENCOUNTER — Telehealth: Payer: Self-pay

## 2017-07-10 NOTE — Telephone Encounter (Signed)
Has appt 6/28 at noon If there is concern for blood or infection---just checking urine is not good enough Glad he got appt

## 2017-07-10 NOTE — Telephone Encounter (Signed)
I spoke with pt; started on 07/09/17 with pain upon urination and pt questions if blood in urine. Pt already has lab appt tomorrow for labs prior to appt with Dr Glori Bickers on 07/15/17. Pt scheduled appt to see Dr Silvio Pate on 07/10/17 at 12 noon. Pt voiced understanding.

## 2017-07-10 NOTE — Telephone Encounter (Signed)
Copied from [place] [id number]. Topic: General - Other >> Jul 10, 2017  3:26 PM Yvette Rack wrote: Reason for CRM: Pt wife Mardene Celeste requests a lab for a urine analysis during his lab appt scheduled for 07/11/17 11:30 am. Mardene Celeste requests a call back. Cb# [phone]

## 2017-07-11 ENCOUNTER — Other Ambulatory Visit (INDEPENDENT_AMBULATORY_CARE_PROVIDER_SITE_OTHER): Payer: Medicare Other

## 2017-07-11 ENCOUNTER — Ambulatory Visit (INDEPENDENT_AMBULATORY_CARE_PROVIDER_SITE_OTHER): Payer: Medicare Other | Admitting: Internal Medicine

## 2017-07-11 ENCOUNTER — Encounter: Payer: Self-pay | Admitting: Internal Medicine

## 2017-07-11 VITALS — BP 124/84 | HR 61 | Temp 98.1°F | Ht 68.75 in | Wt 208.0 lb

## 2017-07-11 DIAGNOSIS — R7303 Prediabetes: Secondary | ICD-10-CM

## 2017-07-11 DIAGNOSIS — N200 Calculus of kidney: Secondary | ICD-10-CM

## 2017-07-11 LAB — HEMOGLOBIN A1C: Hgb A1c MFr Bld: 6.4 % (ref 4.6–6.5)

## 2017-07-11 NOTE — Patient Instructions (Addendum)
We will let you know about what kind of stone this is. You should drink a lot of fluids (especially water) and read the information I printed for you.

## 2017-07-11 NOTE — Assessment & Plan Note (Addendum)
Most likely the cause of the hematuria Doesn't seem to have UTI Past smoker---if hematuria recurs, should get cystoscopy (but would hold off if no recurrence) Will send stone Discussed increased citrate diet (info from [employer] printed for him)

## 2017-07-11 NOTE — Progress Notes (Signed)
Subjective:    Patient ID: Scott Gomez, male    DOB: [birth year], 83 y.o.   MRN: [id number]  HPI Here due to blood in the urine  Has seen blood in urine for 2 days  Terrible pain with this Just passed stone when giving specimen here  No fever No meds for this other than his chronic back pain meds  Current Outpatient Medications on File Prior to Visit  Medication Sig Dispense Refill  . alendronate (FOSAMAX) 70 MG tablet Take 70 mg by mouth once a week. Take with a full glass of water on an empty stomach.    . ALPRAZolam (XANAX) 0.5 MG tablet TAKE ONE TABLET BY MOUTH TWICE DAILY AS NEEDED FOR ANXIETY 60 tablet 3  . Blood Glucose Monitoring Suppl (ONETOUCH VERIO IQ SYSTEM) w/Device KIT Use to test blood sugar once daily and as needed dx R73.9 1 kit 0  . cyclobenzaprine (FLEXERIL) 10 MG tablet Take 10 mg by mouth 3 (three) times daily as needed. For muscle spasms     . docusate sodium (COLACE) 100 MG capsule Take 100 mg by mouth daily.    Marland Kitchen gabapentin (NEURONTIN) 100 MG capsule Take 200 mg by mouth 3 (three) times daily.     Marland Kitchen glucose blood (ONETOUCH VERIO) test strip USE TO CHECK BLOOD SUGAR ONCE DAILY 100 each 3  . lisinopril (PRINIVIL,ZESTRIL) 10 MG tablet Take 1 tablet (10 mg total) by mouth daily. 90 tablet 3  . ONE TOUCH LANCETS MISC Use to test blood sugar once daily and as needed. Dx 790.29 200 each 1  . Oxycodone HCl 10 MG TABS Take 1 tablet by mouth 2 (two) times daily as needed (pain).     . potassium chloride SA (K-DUR,KLOR-CON) 20 MEQ tablet TAKE ONE (1) TABLET BY MOUTH EVERY DAY 90 tablet 3  . travoprost, benzalkonium, (TRAVATAN) 0.004 % ophthalmic solution Place 1 drop into both eyes at bedtime.       No current facility-administered medications on file prior to visit.     Allergies  Allergen Reactions  . Aspirin     REACTION: GI upset  . Buspirone Hcl     REACTION: itching  . Crestor [Rosuvastatin Calcium]     Increased his blood sugar   . Prednisone    REACTION: sick, per wife he can take this.    Past Medical History:  Diagnosis Date  . Anxiety   . Arthritis    osteoarthritis both hands  . Carpal tunnel syndrome   . Diabetes mellitus    type II  . Fatigue   . Hypercholesteremia   . Hypopotassemia   . Leg cramps   . Nephrolithiasis    Hx of  . Palpitations    Hx of   . Shoulder pain, left   . Snoring   . Testosterone deficiency   . Ulcer    peptic ulcer disease    Past Surgical History:  Procedure Laterality Date  . BACK SURGERY  1978   x2 disc    Family History  Problem Relation Age of Onset  . Cancer Brother        kidney  . Heart disease Brother        MI  . Hypertension Mother   . Allergies Mother   . Hypertension Father   . Allergies Father   . Cancer Sister   . Emphysema Sister   . Heart attack Sister     Social History   Socioeconomic History  .  Marital status: Married    Spouse name: Not on file  . Number of children: Not on file  . Years of education: Not on file  . Highest education level: Not on file  Occupational History  . Not on file  Social Needs  . Financial resource strain: Not on file  . Food insecurity:    Worry: Not on file    Inability: Not on file  . Transportation needs:    Medical: Not on file    Non-medical: Not on file  Tobacco Use  . Smoking status: Former Smoker    Last attempt to quit: 01/14/1993    Years since quitting: 24.5  . Smokeless tobacco: Never Used  Substance and Sexual Activity  . Alcohol use: No    Alcohol/week: 0.0 oz  . Drug use: No  . Sexual activity: Never  Lifestyle  . Physical activity:    Days per week: Not on file    Minutes per session: Not on file  . Stress: Not on file  Relationships  . Social connections:    Talks on phone: Not on file    Gets together: Not on file    Attends religious service: Not on file    Active member of club or organization: Not on file    Attends meetings of clubs or organizations: Not on file     Relationship status: Not on file  . Intimate partner violence:    Fear of current or ex partner: Not on file    Emotionally abused: Not on file    Physically abused: Not on file    Forced sexual activity: Not on file  Other Topics Concern  . Not on file  Social History Narrative   Lives with wife Scott Gomez   Retired   Review of Systems No nausea or vomiting Eating okay Has lost some weight--working on healthy lifestyle (so excited about this) Got some cortisone shots in back--didn't help    Objective:   Physical Exam  Constitutional: He appears well-developed. No distress.  GI: Soft. He exhibits no distension. There is no tenderness. There is no rebound and no guarding.  Musculoskeletal:  No CVA tenderness           Assessment & Plan:

## 2017-07-15 ENCOUNTER — Ambulatory Visit (INDEPENDENT_AMBULATORY_CARE_PROVIDER_SITE_OTHER): Payer: Medicare Other | Admitting: Family Medicine

## 2017-07-15 ENCOUNTER — Encounter: Payer: Self-pay | Admitting: Family Medicine

## 2017-07-15 VITALS — BP 125/70 | HR 60 | Temp 98.5°F | Ht 68.75 in | Wt 207.5 lb

## 2017-07-15 DIAGNOSIS — Z6830 Body mass index (BMI) 30.0-30.9, adult: Secondary | ICD-10-CM

## 2017-07-15 DIAGNOSIS — F411 Generalized anxiety disorder: Secondary | ICD-10-CM | POA: Diagnosis not present

## 2017-07-15 DIAGNOSIS — M48062 Spinal stenosis, lumbar region with neurogenic claudication: Secondary | ICD-10-CM

## 2017-07-15 DIAGNOSIS — E6609 Other obesity due to excess calories: Secondary | ICD-10-CM | POA: Diagnosis not present

## 2017-07-15 DIAGNOSIS — I10 Essential (primary) hypertension: Secondary | ICD-10-CM | POA: Diagnosis not present

## 2017-07-15 LAB — STONE ANALYSIS: STONE WEIGHT: 0.04 g

## 2017-07-15 NOTE — Patient Instructions (Addendum)
Blood pressure and blood sugar are improved Keep drinking fluids to prevent kidney stones   See the back specialist as planned   Keep watching diet Try to get most of your carbohydrates from produce (with the exception of white potatoes)  Eat less bread/pasta/rice/snack foods/cereals/sweets and other items from the middle of the grocery store (processed carbs) The weight loss has helped also   No change in medicines   Follow up in April for yearly exam

## 2017-07-15 NOTE — Progress Notes (Signed)
Subjective:    Patient ID: Scott Gomez, male    DOB: [birth year], 83 y.o.   MRN: [id number]  HPI  Here for f/u of chronic health problems  Still having a lot of pain in his back/ had an injection  Goes to pain clinic  Pain in hip and back   Also passed a kidney stone on Friday   Otherwise doing ok    Wt Readings from Last 3 Encounters:  07/15/17 207 lb 8 oz (94.1 kg)  07/11/17 208 lb (94.3 kg)  04/14/17 221 lb 8 oz (100.5 kg)  has worked on weight loss - down from 221 in April  30.87 kg/m   Last visit inc lisinopril from 5 to 10 mg  No problems  bp is stable today (improved from April)  No cp or palpitations or headaches or edema  No side effects to medicines  BP Readings from Last 3 Encounters:  07/15/17 138/78  07/11/17 124/84  04/14/17 (!) 146/76    Better on next check 125/70  Lab Results  Component Value Date   CREATININE 0.81 04/11/2017   BUN 16 04/11/2017   NA 137 04/11/2017   K 4.3 04/11/2017   CL 104 04/11/2017   CO2 27 04/11/2017    Prediabetes A1C 6.6 last visit Lab Results  Component Value Date   HGBA1C 6.4 07/11/2017   This is improved  Eating less sugar and starchy stuff Cut back on cheese crackers  Cut back on popcorn  Also changed to brown bread  Avoids sweets   Can't get much exercise with his back    Patient Active Problem List   Diagnosis Date Noted  . Routine general medical examination at a health care facility 04/09/2015  . Osteoporosis 04/07/2015  . Constipation 05/16/2014  . Polyneuropathy 03/23/2014  . Encounter for Medicare annual wellness exam 11/25/2012  . Spinal stenosis 08/27/2011  . Vitamin B 12 deficiency 08/06/2011  . Obesity 05/13/2011  . Falls frequently 05/01/2011  . Prostate cancer screening 11/07/2010  . DEPRESSION 02/26/2010  . Essential hypertension, benign 03/29/2008  . COPD (chronic obstructive pulmonary disease) ([place]) 07/08/2007  . Sleep apnea 07/08/2007  . TESTOSTERONE DEFICIENCY 06/22/2007    . Prediabetes 01/07/2007  . HYPERCHOLESTEROLEMIA 01/07/2007  . Generalized anxiety disorder 01/07/2007  . CARPAL TUNNEL SYNDROME 01/07/2007  . PEPTIC ULCER DISEASE 01/07/2007  . OSTEOARTHRITIS, HANDS, BILATERAL 01/07/2007  . SHOULDER PAIN, LEFT, CHRONIC 01/07/2007  . PALPITATIONS, HX OF 01/07/2007  . Nephrolithiasis 01/07/2007   Past Medical History:  Diagnosis Date  . Anxiety   . Arthritis    osteoarthritis both hands  . Carpal tunnel syndrome   . Diabetes mellitus    type II  . Fatigue   . Hypercholesteremia   . Hypopotassemia   . Leg cramps   . Nephrolithiasis    Hx of  . Palpitations    Hx of   . Shoulder pain, left   . Snoring   . Testosterone deficiency   . Ulcer    peptic ulcer disease   Past Surgical History:  Procedure Laterality Date  . BACK SURGERY  1978   x2 disc   Social History   Tobacco Use  . Smoking status: Former Smoker    Last attempt to quit: 01/14/1993    Years since quitting: 24.5  . Smokeless tobacco: Never Used  Substance Use Topics  . Alcohol use: No    Alcohol/week: 0.0 oz  . Drug use: No   Family History  Problem  Relation Age of Onset  . Cancer Brother        kidney  . Heart disease Brother        MI  . Hypertension Mother   . Allergies Mother   . Hypertension Father   . Allergies Father   . Cancer Sister   . Emphysema Sister   . Heart attack Sister    Allergies  Allergen Reactions  . Aspirin     REACTION: GI upset  . Buspirone Hcl     REACTION: itching  . Crestor [Rosuvastatin Calcium]     Increased his blood sugar   . Prednisone     REACTION: sick, per wife he can take this.   Current Outpatient Medications on File Prior to Visit  Medication Sig Dispense Refill  . alendronate (FOSAMAX) 70 MG tablet Take 70 mg by mouth once a week. Take with a full glass of water on an empty stomach.    . ALPRAZolam (XANAX) 0.5 MG tablet TAKE ONE TABLET BY MOUTH TWICE DAILY AS NEEDED FOR ANXIETY 60 tablet 3  . Blood Glucose  Monitoring Suppl (ONETOUCH VERIO IQ SYSTEM) w/Device KIT Use to test blood sugar once daily and as needed dx R73.9 1 kit 0  . cyclobenzaprine (FLEXERIL) 10 MG tablet Take 10 mg by mouth 3 (three) times daily as needed. For muscle spasms     . docusate sodium (COLACE) 100 MG capsule Take 100 mg by mouth daily.    Marland Kitchen gabapentin (NEURONTIN) 100 MG capsule Take 200 mg by mouth 3 (three) times daily.     Marland Kitchen glucose blood (ONETOUCH VERIO) test strip USE TO CHECK BLOOD SUGAR ONCE DAILY 100 each 3  . lisinopril (PRINIVIL,ZESTRIL) 10 MG tablet Take 1 tablet (10 mg total) by mouth daily. 90 tablet 3  . ONE TOUCH LANCETS MISC Use to test blood sugar once daily and as needed. Dx 790.29 200 each 1  . Oxycodone HCl 10 MG TABS Take 1 tablet by mouth 2 (two) times daily as needed (pain).     . potassium chloride SA (K-DUR,KLOR-CON) 20 MEQ tablet TAKE ONE (1) TABLET BY MOUTH EVERY DAY 90 tablet 3  . travoprost, benzalkonium, (TRAVATAN) 0.004 % ophthalmic solution Place 1 drop into both eyes at bedtime.       No current facility-administered medications on file prior to visit.     Review of Systems  Constitutional: Negative for activity change, appetite change, fatigue, fever and unexpected weight change.  HENT: Negative for congestion, rhinorrhea, sore throat and trouble swallowing.   Eyes: Negative for pain, redness, itching and visual disturbance.  Respiratory: Negative for cough, chest tightness, shortness of breath and wheezing.   Cardiovascular: Negative for chest pain and palpitations.  Gastrointestinal: Negative for abdominal pain, blood in stool, constipation, diarrhea and nausea.  Endocrine: Negative for cold intolerance, heat intolerance, polydipsia and polyuria.  Genitourinary: Negative for difficulty urinating, dysuria, frequency and urgency.  Musculoskeletal: Positive for back pain and gait problem. Negative for arthralgias, joint swelling and myalgias.  Skin: Negative for pallor and rash.    Neurological: Negative for dizziness, tremors, weakness, numbness and headaches.  Hematological: Negative for adenopathy. Does not bruise/bleed easily.  Psychiatric/Behavioral: Negative for decreased concentration and dysphoric mood. The patient is not nervous/anxious.        Objective:   Physical Exam  Constitutional: He appears well-developed and well-nourished. No distress.  HENT:  Head: Normocephalic and atraumatic.  Mouth/Throat: Oropharynx is clear and moist.  Eyes: Pupils are equal, round,  and reactive to light. Conjunctivae and EOM are normal.  Neck: Normal range of motion. Neck supple. No JVD present. Carotid bruit is not present. No thyromegaly present.  Cardiovascular: Normal rate, regular rhythm, normal heart sounds and intact distal pulses. Exam reveals no gallop.  Pulmonary/Chest: Effort normal and breath sounds normal. No respiratory distress. He has no wheezes. He has no rales.  No crackles  Diffusely distant bs   Abdominal: Soft. Bowel sounds are normal. He exhibits no distension, no abdominal bruit and no mass. There is no tenderness.  Musculoskeletal: He exhibits no edema.  Lymphadenopathy:    He has no cervical adenopathy.  Neurological: He is alert. He has normal reflexes. No cranial nerve deficit. Coordination normal.  Baseline foot drop Not wearing AFO today Using cane  Skin: Skin is warm and dry. No rash noted.  Psychiatric: He has a normal mood and affect.  Pleasant Mentally sharp today          Assessment & Plan:   Problem List Items Addressed This Visit      Cardiovascular and Mediastinum   Essential hypertension, benign - Primary    bp in fair control at this time  BP Readings from Last 1 Encounters:  07/15/17 125/70   No changes needed Most recent labs reviewed  Disc lifstyle change with low sodium diet and exercise  Improved with lisinopril 10 mg and tolerating well         Other   Generalized anxiety disorder    Well controlled  with xanax currently       Obesity    Discussed how this problem influences overall health and the risks it imposes  Reviewed plan for weight loss with lower calorie diet (via better food choices and also portion control or program like weight watchers) and exercise building up to or more than 30 minutes 5 days per week including some aerobic activity   Commended on wt lost so far with better diet      Spinal stenosis    For f/u with back /pain clinic today Pain is much worse lately which frustrates him

## 2017-07-15 NOTE — Assessment & Plan Note (Signed)
For f/u with back /pain clinic today Pain is much worse lately which frustrates him

## 2017-07-15 NOTE — Assessment & Plan Note (Signed)
bp in fair control at this time  BP Readings from Last 1 Encounters:  07/15/17 125/70   No changes needed Most recent labs reviewed  Disc lifstyle change with low sodium diet and exercise  Improved with lisinopril 10 mg and tolerating well

## 2017-07-15 NOTE — Assessment & Plan Note (Signed)
Discussed how this problem influences overall health and the risks it imposes  Reviewed plan for weight loss with lower calorie diet (via better food choices and also portion control or program like weight watchers) and exercise building up to or more than 30 minutes 5 days per week including some aerobic activity   Commended on wt lost so far with better diet

## 2017-07-15 NOTE — Assessment & Plan Note (Signed)
Well controlled with xanax currently

## 2017-07-23 ENCOUNTER — Other Ambulatory Visit: Payer: Self-pay | Admitting: Physical Medicine and Rehabilitation

## 2017-07-23 DIAGNOSIS — M5416 Radiculopathy, lumbar region: Secondary | ICD-10-CM

## 2017-08-06 ENCOUNTER — Ambulatory Visit
Admission: RE | Admit: 2017-08-06 | Discharge: 2017-08-06 | Disposition: A | Discharge status: Home / Self Care | Payer: Medicare Other | Source: Ambulatory Visit | Attending: Physical Medicine and Rehabilitation | Admitting: Physical Medicine and Rehabilitation

## 2017-08-06 DIAGNOSIS — M5416 Radiculopathy, lumbar region: Secondary | ICD-10-CM

## 2017-08-06 MED ORDER — GADOBENATE DIMEGLUMINE 529 MG/ML IV SOLN
19.0000 mL | Freq: Once | INTRAVENOUS | Status: AC | PRN
  Administered 2017-08-06: 19 mL via INTRAVENOUS

## 2017-08-18 ENCOUNTER — Other Ambulatory Visit: Payer: Self-pay | Admitting: Family Medicine

## 2017-10-30 ENCOUNTER — Ambulatory Visit: Payer: Medicare Other

## 2017-11-03 ENCOUNTER — Encounter: Payer: Self-pay | Admitting: Primary Care

## 2017-11-03 ENCOUNTER — Ambulatory Visit: Payer: Medicare Other | Admitting: Primary Care

## 2017-11-03 VITALS — BP 140/90 | HR 89 | Temp 98.2°F | Ht 68.75 in | Wt 209.2 lb

## 2017-11-03 DIAGNOSIS — G4733 Obstructive sleep apnea (adult) (pediatric): Secondary | ICD-10-CM

## 2017-11-03 DIAGNOSIS — R0602 Shortness of breath: Secondary | ICD-10-CM | POA: Diagnosis not present

## 2017-11-03 MED ORDER — ALBUTEROL SULFATE HFA 108 (90 BASE) MCG/ACT IN AERS: 2.0000 | INHALATION_SPRAY | RESPIRATORY_TRACT | 2 refills | Status: AC | PRN

## 2017-11-03 MED ORDER — PREDNISONE 10 MG PO TABS: ORAL_TABLET | ORAL | 0 refills | Status: DC

## 2017-11-03 MED ORDER — AZITHROMYCIN 250 MG PO TABS: ORAL_TABLET | ORAL | 0 refills | Status: DC

## 2017-11-03 MED ORDER — LEVALBUTEROL HCL 0.63 MG/3ML IN NEBU
0.6300 mg | INHALATION_SOLUTION | RESPIRATORY_TRACT | Status: AC
  Administered 2017-11-03: 0.63 mg via RESPIRATORY_TRACT

## 2017-11-03 NOTE — Assessment & Plan Note (Addendum)
-   Stable; Good compliance, needs to work on wearing mask for >4 hours each night  - CPAP setting 10cm H20, AHI 1.0  - Follow up in 6 months

## 2017-11-03 NOTE — Assessment & Plan Note (Addendum)
-   Mild exacerbation; complains of cough with clear mucus and sob x 8 days - Received xopenex neb treatment with improvement in wheeze  - RX prednisone 20mg  x 5 days and zpack.  - Albuterol rescue inhaler 2 puffs every 4-6 hours as needed - Recommend mucinex twice daily for congestion  - Return if not better in 7-10 days or sooner if symptoms worsen

## 2017-11-03 NOTE — Addendum Note (Signed)
Addended by: Karmen Stabs on: 11/03/2017 03:59 PM   Modules accepted: Orders

## 2017-11-03 NOTE — Patient Instructions (Addendum)
Make sure to wear your CPAP every night, goal 4-6 hours or more  Do not drive if experiencing excessive daytime fatigue or somnolence   Office treatments: -Xopenex neb treatement  RX: - Prednisone 20mg  daily x 5 days - Zpack as prescribed  - Proair rescue inhaler, take 2 puffs every 4-6 hours as needed for shortness of breath/wheezing    Recommendations: - Mucinex twice daily for chest congestion   Follow-up: - Return if no improvement in 7-10 days or if symptoms worsen - 6 months with [name]

## 2017-11-03 NOTE — Progress Notes (Signed)
'@Patient'  ID: Scott Gomez, male    DOB: Nov 28, 1934, 83 y.o.   MRN: [id number]  Chief Complaint  Patient presents with  . Follow-up    1 year CPAP-full face mask    Referring provider: [name], [name], MD  HPI: 83 year old male, former smoker quit 1995. PMH hypertension, moderate OSA (on CPAP) and COPD. Patient of [name], last seen by pulmonary NP on 08/10/15.   11/03/2017 Patient presents today for routine follow-up visit for sleep apnea. Complains of sob and cough for 8 days. Cough is productive, described as thick white. Has tried rescue inhaler in the past with reported improvement in his breathing. He has been wearing his cpap most nights, experiences some mild air leaking. Denies wheezing.   Airview download: Usage 83/90 days, 66% >4 hours CPAP mode, pressure 10cm H20 Leaks 27.1 L/min (95th percentile) AHI 1.0    Allergies  Allergen Reactions  . Buspirone Hcl     REACTION: itching  . Crestor [Rosuvastatin Calcium]     Increased his blood sugar   . Prednisone     REACTION: sick, per wife he can take this.    Immunization History  Administered Date(s) Administered  . Influenza Split 11/12/2010, 11/12/2011  . Influenza,inj,Quad PF,6+ Mos 11/11/2013, 03/17/2015, 10/13/2015, 11/19/2016  . Influenza-Unspecified 10/06/2012  . Pneumococcal Conjugate-13 03/23/2014  . Pneumococcal Polysaccharide-23 02/21/2009  . Td 08/21/2009    Past Medical History:  Diagnosis Date  . Anxiety   . Arthritis    osteoarthritis both hands  . Carpal tunnel syndrome   . Diabetes mellitus    type II  . Fatigue   . Hypercholesteremia   . Hypopotassemia   . Leg cramps   . Nephrolithiasis    Hx of  . Palpitations    Hx of   . Shoulder pain, left   . Snoring   . Testosterone deficiency   . Ulcer    peptic ulcer disease    Tobacco History: Social History   Tobacco Use  Smoking Status Former Smoker  . Last attempt to quit: 01/14/1993  . Years since quitting: 24.8    Smokeless Tobacco Never Used   Counseling given: Not Answered   Outpatient Medications Prior to Visit  Medication Sig Dispense Refill  . alendronate (FOSAMAX) 70 MG tablet Take 70 mg by mouth once a week. Take with a full glass of water on an empty stomach.    . ALPRAZolam (XANAX) 0.5 MG tablet TAKE ONE (1) TABLET BY MOUTH TWO (2) TIMES DAILY AS NEEDED FOR ANXIETY 60 tablet 3  . Blood Glucose Monitoring Suppl (ONETOUCH VERIO IQ SYSTEM) w/Device KIT Use to test blood sugar once daily and as needed dx R73.9 1 kit 0  . cyclobenzaprine (FLEXERIL) 10 MG tablet Take 10 mg by mouth 3 (three) times daily as needed. For muscle spasms     . docusate sodium (COLACE) 100 MG capsule Take 100 mg by mouth daily.    Marland Kitchen gabapentin (NEURONTIN) 100 MG capsule Take 200 mg by mouth 3 (three) times daily.     Marland Kitchen glucose blood (ONETOUCH VERIO) test strip USE TO CHECK BLOOD SUGAR ONCE DAILY 100 each 3  . lisinopril (PRINIVIL,ZESTRIL) 10 MG tablet Take 1 tablet (10 mg total) by mouth daily. 90 tablet 3  . ONE TOUCH LANCETS MISC Use to test blood sugar once daily and as needed. Dx 790.29 200 each 1  . Oxycodone HCl 10 MG TABS Take 1 tablet by mouth 2 (two) times  daily as needed (pain).     . potassium chloride SA (K-DUR,KLOR-CON) 20 MEQ tablet TAKE ONE (1) TABLET BY MOUTH EVERY DAY 90 tablet 3  . travoprost, benzalkonium, (TRAVATAN) 0.004 % ophthalmic solution Place 1 drop into both eyes at bedtime.       No facility-administered medications prior to visit.     Review of Systems  Review of Systems  Constitutional: Negative.   HENT: Negative.   Respiratory: Positive for cough and shortness of breath. Negative for wheezing.   Cardiovascular: Negative.     Physical Exam  BP 140/90 (BP Location: Left Arm, Cuff Size: Normal)   Pulse 89   Temp 98.2 F (36.8 C)   Ht 5' 8.75" (1.746 m)   Wt 209 lb 3.2 oz (94.9 kg)   SpO2 96%   BMI 31.12 kg/m  Physical Exam  Constitutional: He is oriented to person, place,  and time. He appears well-developed and well-nourished.  HENT:  Head: Normocephalic and atraumatic.  Mouth/Throat: Uvula is midline and oropharynx is clear and moist.  Eyes: Pupils are equal, round, and reactive to light. EOM and lids are normal. Right conjunctiva is injected. Left conjunctiva is injected.  Neck: Normal range of motion. Neck supple.  Cardiovascular: Normal rate and regular rhythm.  Pulmonary/Chest: Effort normal. No stridor. No respiratory distress. He has no rales. He exhibits no tenderness.  CTA, mild audible upper airway wheeze. No resp distress   Musculoskeletal: Normal range of motion.  Neurological: He is alert and oriented to person, place, and time.  Skin: Skin is warm and dry.  Psychiatric: He has a normal mood and affect. His behavior is normal. Judgment and thought content normal.     Lab Results:  CBC    Component Value Date/Time   WBC 5.6 04/11/2017 1209   RBC 4.73 04/11/2017 1209   HGB 13.5 04/11/2017 1209   HCT 39.4 04/11/2017 1209   PLT 194.0 04/11/2017 1209   MCV 83.3 04/11/2017 1209   MCH 28.1 07/30/2015 1039   MCHC 34.2 04/11/2017 1209   RDW 14.4 04/11/2017 1209   LYMPHSABS 1.4 04/11/2017 1209   MONOABS 0.5 04/11/2017 1209   EOSABS 0.1 04/11/2017 1209   BASOSABS 0.1 04/11/2017 1209    BMET    Component Value Date/Time   NA 137 04/11/2017 1209   K 4.3 04/11/2017 1209   CL 104 04/11/2017 1209   CO2 27 04/11/2017 1209   GLUCOSE 138 (H) 04/11/2017 1209   BUN 16 04/11/2017 1209   CREATININE 0.81 04/11/2017 1209   CALCIUM 9.2 04/11/2017 1209   GFRNONAA >60 07/30/2015 1039   GFRAA >60 07/30/2015 1039    BNP No results found for: BNP  ProBNP No results found for: PROBNP  Imaging: No results found.   Assessment & Plan:   Sleep apnea - Stable; Good compliance, needs to work on wearing mask for >4 hours each night  - CPAP setting 10cm H20, AHI 1.0  - Follow up in 6 months  COPD (chronic obstructive pulmonary disease) - Mild  exacerbation; complains of cough with clear mucus and sob x 8 days - Received xopenex neb treatment with improvement in wheeze  - RX prednisone 8m x 5 days and zpack.  - Albuterol rescue inhaler 2 puffs every 4-6 hours as needed - Recommend mucinex twice daily for congestion  - Return if not better in 7-10 days or sooner if symptoms worsen   EMartyn Ehrich NP 11/03/2017

## 2017-11-06 ENCOUNTER — Ambulatory Visit: Payer: Medicare Other

## 2017-11-07 ENCOUNTER — Telehealth: Payer: Self-pay | Admitting: Primary Care

## 2017-11-07 ENCOUNTER — Ambulatory Visit: Payer: Medicare Other | Admitting: Primary Care

## 2017-11-07 ENCOUNTER — Encounter: Payer: Self-pay | Admitting: Primary Care

## 2017-11-07 ENCOUNTER — Other Ambulatory Visit (INDEPENDENT_AMBULATORY_CARE_PROVIDER_SITE_OTHER): Payer: Medicare Other

## 2017-11-07 ENCOUNTER — Telehealth: Payer: Self-pay

## 2017-11-07 ENCOUNTER — Ambulatory Visit (INDEPENDENT_AMBULATORY_CARE_PROVIDER_SITE_OTHER)
Admission: RE | Admit: 2017-11-07 | Discharge: 2017-11-07 | Disposition: A | Discharge status: Home / Self Care | Payer: Medicare Other | Source: Ambulatory Visit | Attending: Primary Care | Admitting: Primary Care

## 2017-11-07 VITALS — BP 144/84 | HR 86 | Temp 99.0°F | Ht 68.75 in | Wt 208.0 lb

## 2017-11-07 DIAGNOSIS — J441 Chronic obstructive pulmonary disease with (acute) exacerbation: Secondary | ICD-10-CM | POA: Diagnosis not present

## 2017-11-07 DIAGNOSIS — R0602 Shortness of breath: Secondary | ICD-10-CM

## 2017-11-07 DIAGNOSIS — R9389 Abnormal findings on diagnostic imaging of other specified body structures: Secondary | ICD-10-CM

## 2017-11-07 DIAGNOSIS — K219 Gastro-esophageal reflux disease without esophagitis: Secondary | ICD-10-CM | POA: Insufficient documentation

## 2017-11-07 LAB — BASIC METABOLIC PANEL
BUN: 16 mg/dL (ref 6–23)
CALCIUM: 9.7 mg/dL (ref 8.4–10.5)
CO2: 28 meq/L (ref 19–32)
Chloride: 96 mEq/L (ref 96–112)
Creatinine, Ser: 0.89 mg/dL (ref 0.40–1.50)
GFR: 87.07 mL/min (ref >60.00)
GLUCOSE: 156 mg/dL — AB (ref 70–99)
Potassium: 4.3 mEq/L (ref 3.5–5.1)
SODIUM: 133 meq/L — AB (ref 135–145)

## 2017-11-07 LAB — CBC WITH DIFFERENTIAL/PLATELET
Basophils Absolute: 0.1 10*3/uL (ref 0.0–0.1)
Basophils Relative: 0.4 % (ref 0.0–3.0)
EOS PCT: 0 % (ref 0.0–5.0)
Eosinophils Absolute: 0 10*3/uL (ref 0.0–0.7)
HCT: 41.4 % (ref 39.0–52.0)
Hemoglobin: 13.9 g/dL (ref 13.0–17.0)
LYMPHS ABS: 1.7 10*3/uL (ref 0.7–4.0)
Lymphocytes Relative: 10.9 % — ABNORMAL LOW (ref 12.0–46.0)
MCHC: 33.5 g/dL (ref 30.0–36.0)
MCV: 86.5 fl (ref 78.0–100.0)
MONO ABS: 1.9 10*3/uL — AB (ref 0.1–1.0)
MONOS PCT: 11.7 % (ref 3.0–12.0)
NEUTROS ABS: 12.4 10*3/uL — AB (ref 1.4–7.7)
NEUTROS PCT: 77 % (ref 43.0–77.0)
PLATELETS: 249 10*3/uL (ref 150.0–400.0)
RBC: 4.78 Mil/uL (ref 4.22–5.81)
RDW: 13.5 % (ref 11.5–15.5)
WBC: 16 10*3/uL — ABNORMAL HIGH (ref 4.0–10.5)

## 2017-11-07 MED ORDER — LEVALBUTEROL HCL 0.63 MG/3ML IN NEBU: 0.6300 mg | INHALATION_SOLUTION | RESPIRATORY_TRACT | 12 refills | Status: AC | PRN

## 2017-11-07 MED ORDER — LEVALBUTEROL HCL 0.63 MG/3ML IN NEBU
0.6300 mg | INHALATION_SOLUTION | RESPIRATORY_TRACT | Status: AC
  Administered 2017-11-07: 0.63 mg via RESPIRATORY_TRACT

## 2017-11-07 MED ORDER — METHYLPREDNISOLONE ACETATE 80 MG/ML IJ SUSP
80.0000 mg | Freq: Once | INTRAMUSCULAR | Status: AC
  Administered 2017-11-07: 80 mg via INTRAMUSCULAR

## 2017-11-07 NOTE — Progress Notes (Addendum)
'@Patient'  ID: Scott Gomez, male    DOB: [birth year], 83 y.o.   MRN: [id number]  Chief Complaint  Patient presents with  . Acute Visit    breathing getting worse, chest hurts and shoulder blades, wheezing, SOB, cough with white mucus    Referring provider: [name], [name], MD  HPI: 83 year old male, former smoker quit 1995. PMH hypertension, moderate OSA (on CPAP) and COPD. Patient of [name], last seen by pulmonary NP on 08/10/15.   11/03/2017 Patient presents today for routine follow-up visit for sleep apnea. Complains of sob and cough for 8 days. Cough is productive, described as thick white. Has tried rescue inhaler in the past with reported improvement in his breathing. He has been wearing his cpap most nights, experiences some mild air leaking. Denies wheezing.   11/07/2017 Patient presents today for acute visit with worsening sob, wheezing. Associated chest tightness, pleuritic pain. Cough mildly productive with white mucus. Temp 99.0. No improvement with zpack or prednisone course. Symptoms started 12 days ago. Oral steroid upset his stomach. Experiencing some reflux. Denies arm or jaw pain.    Allergies  Allergen Reactions  . Buspirone Hcl     REACTION: itching  . Crestor [Rosuvastatin Calcium]     Increased his blood sugar   . Prednisone     REACTION: sick, per wife he can take this.    Immunization History  Administered Date(s) Administered  . Influenza Split 11/12/2010, 11/12/2011  . Influenza,inj,Quad PF,6+ Mos 11/11/2013, 03/17/2015, 10/13/2015, 11/19/2016  . Influenza-Unspecified 10/06/2012  . Pneumococcal Conjugate-13 03/23/2014  . Pneumococcal Polysaccharide-23 02/21/2009  . Td 08/21/2009    Past Medical History:  Diagnosis Date  . Anxiety   . Arthritis    osteoarthritis both hands  . Carpal tunnel syndrome   . Diabetes mellitus    type II  . Fatigue   . Hypercholesteremia   . Hypopotassemia   . Leg cramps   . Nephrolithiasis    Hx of  .  Palpitations    Hx of   . Shoulder pain, left   . Snoring   . Testosterone deficiency   . Ulcer    peptic ulcer disease    Tobacco History: Social History   Tobacco Use  Smoking Status Former Smoker  . Last attempt to quit: 01/14/1993  . Years since quitting: 24.8  Smokeless Tobacco Never Used   Counseling given: Not Answered   Outpatient Medications Prior to Visit  Medication Sig Dispense Refill  . albuterol (PROAIR HFA) 108 (90 Base) MCG/ACT inhaler Inhale 2 puffs into the lungs every 4 (four) hours as needed for wheezing or shortness of breath. 1 Inhaler 2  . alendronate (FOSAMAX) 70 MG tablet Take 70 mg by mouth once a week. Take with a full glass of water on an empty stomach.    . ALPRAZolam (XANAX) 0.5 MG tablet TAKE ONE (1) TABLET BY MOUTH TWO (2) TIMES DAILY AS NEEDED FOR ANXIETY 60 tablet 3  . azithromycin (ZITHROMAX) 250 MG tablet Zpack taper as directed 6 tablet 0  . Blood Glucose Monitoring Suppl (ONETOUCH VERIO IQ SYSTEM) w/Device KIT Use to test blood sugar once daily and as needed dx R73.9 1 kit 0  . cyclobenzaprine (FLEXERIL) 10 MG tablet Take 10 mg by mouth 3 (three) times daily as needed. For muscle spasms     . docusate sodium (COLACE) 100 MG capsule Take 100 mg by mouth daily.    Marland Kitchen gabapentin (NEURONTIN) 100 MG capsule Take 200  mg by mouth 3 (three) times daily.     Marland Kitchen glucose blood (ONETOUCH VERIO) test strip USE TO CHECK BLOOD SUGAR ONCE DAILY 100 each 3  . lisinopril (PRINIVIL,ZESTRIL) 10 MG tablet Take 1 tablet (10 mg total) by mouth daily. 90 tablet 3  . ONE TOUCH LANCETS MISC Use to test blood sugar once daily and as needed. Dx 790.29 200 each 1  . Oxycodone HCl 10 MG TABS Take 1 tablet by mouth 2 (two) times daily as needed (pain).     . potassium chloride SA (K-DUR,KLOR-CON) 20 MEQ tablet TAKE ONE (1) TABLET BY MOUTH EVERY DAY 90 tablet 3  . predniSONE (DELTASONE) 10 MG tablet Take 2 tabs x 5 days 10 tablet 0  . travoprost, benzalkonium, (TRAVATAN) 0.004  % ophthalmic solution Place 1 drop into both eyes at bedtime.       No facility-administered medications prior to visit.     Review of Systems  Review of Systems  Respiratory: Positive for cough, shortness of breath and wheezing. Negative for apnea, choking and stridor.   Cardiovascular: Negative.   Gastrointestinal:       GERD symptoms    Physical Exam  BP (!) 144/84 (BP Location: Left Arm, Cuff Size: Normal)   Pulse 86   Temp 99 F (37.2 C)   Ht 5' 8.75" (1.746 m)   Wt 208 lb (94.3 kg)   SpO2 95%   BMI 30.94 kg/m  Physical Exam  Constitutional: He is oriented to person, place, and time. He appears well-developed and well-nourished.  HENT:  Head: Normocephalic and atraumatic.  Eyes: Pupils are equal, round, and reactive to light. EOM are normal.  Neck: Normal range of motion. Neck supple.  Cardiovascular: Normal rate and regular rhythm.  Pulmonary/Chest: Effort normal. No stridor. No respiratory distress. He has wheezes.  Musculoskeletal: Normal range of motion.  Lymphadenopathy:    He has no cervical adenopathy.  Neurological: He is alert and oriented to person, place, and time.  Skin: Skin is warm and dry.     Lab Results:  CBC    Component Value Date/Time   WBC 16.0 (H) 11/07/2017 1510   RBC 4.78 11/07/2017 1510   HGB 13.9 11/07/2017 1510   HCT 41.4 11/07/2017 1510   PLT 249.0 11/07/2017 1510   MCV 86.5 11/07/2017 1510   MCH 28.1 07/30/2015 1039   MCHC 33.5 11/07/2017 1510   RDW 13.5 11/07/2017 1510   LYMPHSABS 1.7 11/07/2017 1510   MONOABS 1.9 (H) 11/07/2017 1510   EOSABS 0.0 11/07/2017 1510   BASOSABS 0.1 11/07/2017 1510    BMET    Component Value Date/Time   NA 133 (L) 11/07/2017 1510   K 4.3 11/07/2017 1510   CL 96 11/07/2017 1510   CO2 28 11/07/2017 1510   GLUCOSE 156 (H) 11/07/2017 1510   BUN 16 11/07/2017 1510   CREATININE 0.89 11/07/2017 1510   CALCIUM 9.7 11/07/2017 1510   GFRNONAA >60 07/30/2015 1039   GFRAA >60 07/30/2015 1039     BNP No results found for: BNP  ProBNP No results found for: PROBNP  Imaging: Dg Chest 2 View  Result Date: 11/07/2017 CLINICAL DATA:  83 year old with mid sternal chest pain and cough. Shortness of breath. EXAM: CHEST - 2 VIEW COMPARISON:  Chest radiograph 01/28/2017 FINDINGS: Increased soft tissue densities in the right paratracheal and right side of the mediastinum. In addition, there appears to be soft tissue fullness in the right hilar region on the lateral view. Tracheal deviation  towards the left which is new. Left lung is clear. Right lung base is clear. Heart size is normal. Atherosclerotic calcifications at the aortic arch. IMPRESSION: Soft tissue fullness and concern for lymphadenopathy along the right side of the mediastinum. This represents a change from the previous examination. Recommend a chest CT with IV contrast to evaluate for a neoplastic process. These results will be called to the ordering clinician or representative by the Radiologist Assistant, and communication documented in the PACS or zVision Dashboard. Electronically Signed   By: Markus Daft M.D.   On: 11/07/2017 15:31     Assessment & Plan:  83 year old male, former smoker quit in 1995. Followed by [name] for hx OSA. Had not been seen since 2017. Saw patient on 11/03/17 for OSA review, during that visit states he had been sick with cough and sob for 8 days. Tx for COPD exac with zpack and prednsione. Returns today not feeling any better. Complains of sob, wheezing, chest tightness and cough. He has improvement with nebulizer treatment. Oral prednisone upset his stomach, having some reflux symptoms. Will not continue prednisone, given depo-medrol shot for wheezing symptoms. Start PPI. EKG showed normal sinus rhythm, no ST elevation/depression or Q wave. Possible anterior defect, may consider ECHO. CXR and labs obtained today. CXR showed new soft tissue fullness, concern for lymphadenopathy. Tracheal deviation towards  left. Lung bases clear. Called patient to review results, left message to call back. Will try again Monday 10/28. Needs CT CHEST with contrast to evaluate further. DME for new nebulizer set up. FU in 1 week. Patient is stable for home. Advised ED if symptoms worsen.   COPD (chronic obstructive pulmonary disease) COPD exacerbation, no improvement Xopenex neb treatment x1 in office with some improvement in breathing symptoms DME referral for new neb start  Stop oral prednisone d/t reflux symptoms  CXR today showed clear lung bases, new soft tissue fullness and tracheal deviation to the left which could likely be causing wheezing/sob (reviewed image with Dr. Halford Chessman)  WBC slightly elevated, attributed to recent steroid    Abnormal CXR (chest x-ray) CXR 11/07/2017- New soft tissue fullness and concern for lymphadenopathy along the right side of the mediastinum. Tracheal deviation to left. Lung bases clear.  Plan: CT CHEST with contrast   GERD (gastroesophageal reflux disease) Recommend starting omeprazole    Martyn Ehrich, NP 11/07/2017

## 2017-11-07 NOTE — Telephone Encounter (Signed)
Notes recorded by Scott Ehrich, NP on 11/07/2017 at 3:42 PM EDT Please order CT Chest with contrast re: abnormal CXR (needs to be in the next week please)  Called and spoke with pt and his wife Scott Gomez letting them know that based on the results of the cxr, Beth wants pt to have a ct scan done. They both expressed understanding. Order has been placed for the ct scan. Nothing further needed.

## 2017-11-07 NOTE — Assessment & Plan Note (Addendum)
Recommend starting omeprazole

## 2017-11-07 NOTE — Telephone Encounter (Signed)
Aware, LM for patient to call back for CXR results. Ordered CT Chest with contrast

## 2017-11-07 NOTE — Patient Instructions (Addendum)
  Office treatment: Depo-medrol IM Xopenex nebulizer x1   Rx: Xopenex nebulizer every 4-6 hours as needed for shortness of breath/wheezing   Orders: CXR and labs today   Referral : DME for new nebulizer start   FU: 1 week follow-up with Beth NP (Friday, Nov 1st)  ED if symptoms worsen or do not improve

## 2017-11-07 NOTE — Assessment & Plan Note (Addendum)
CXR 11/07/2017- New soft tissue fullness and concern for lymphadenopathy along the right side of the mediastinum. Tracheal deviation to left. Lung bases clear.  Plan: CT CHEST with contrast

## 2017-11-07 NOTE — Telephone Encounter (Signed)
Provider left message on machine for pt to return phone call about CXR.

## 2017-11-07 NOTE — Assessment & Plan Note (Addendum)
COPD exacerbation, no improvement Xopenex neb treatment x1 in office with some improvement in breathing symptoms DME referral for new neb start  Stop oral prednisone d/t reflux symptoms  CXR today showed clear lung bases, new soft tissue fullness and tracheal deviation to the left which could likely be causing wheezing/sob (reviewed image with Dr. Halford Chessman)  WBC slightly elevated, attributed to recent steroid

## 2017-11-07 NOTE — Telephone Encounter (Signed)
  Call report received from [facility]. Chest Xray done today 11/07/17 showed-  Soft tissue fullness and concern for lymphadenopathy along the right side of the mediastinum. This represents a change from the previous examination. Recommend a chest CT with IV contrast to evaluate for a neoplastic process.   Will route to Geraldo Pitter, NP

## 2017-11-10 ENCOUNTER — Telehealth: Payer: Self-pay | Admitting: Primary Care

## 2017-11-10 NOTE — Telephone Encounter (Signed)
Attempted to call pt, phone rang and no one picked up.  Wanted to inform pt that CXR showed soft tissue swelling and deviation of trachea.  Wanted to inform him that Beth wanted to get a chest CT with contrast.

## 2017-11-13 ENCOUNTER — Ambulatory Visit: Payer: Medicare Other

## 2017-11-14 ENCOUNTER — Telehealth: Payer: Self-pay | Admitting: Primary Care

## 2017-11-14 ENCOUNTER — Ambulatory Visit: Payer: Medicare Other | Admitting: Primary Care

## 2017-11-14 ENCOUNTER — Encounter: Payer: Self-pay | Admitting: Primary Care

## 2017-11-14 ENCOUNTER — Ambulatory Visit (INDEPENDENT_AMBULATORY_CARE_PROVIDER_SITE_OTHER)
Admission: RE | Admit: 2017-11-14 | Discharge: 2017-11-14 | Disposition: A | Discharge status: Home / Self Care | Payer: Medicare Other | Source: Ambulatory Visit | Attending: Primary Care | Admitting: Primary Care

## 2017-11-14 VITALS — BP 152/90 | HR 75 | Temp 98.1°F | Ht 68.75 in | Wt 207.6 lb

## 2017-11-14 DIAGNOSIS — R9389 Abnormal findings on diagnostic imaging of other specified body structures: Secondary | ICD-10-CM

## 2017-11-14 DIAGNOSIS — R0602 Shortness of breath: Secondary | ICD-10-CM | POA: Diagnosis not present

## 2017-11-14 DIAGNOSIS — R918 Other nonspecific abnormal finding of lung field: Secondary | ICD-10-CM | POA: Diagnosis not present

## 2017-11-14 MED ORDER — IOPAMIDOL (ISOVUE-300) INJECTION 61%
100.0000 mL | Freq: Once | INTRAVENOUS | Status: AC | PRN
  Administered 2017-11-14: 80 mL via INTRAVENOUS

## 2017-11-14 MED ORDER — MAGIC MOUTHWASH W/LIDOCAINE: 5.0000 mL | Freq: Three times a day (TID) | ORAL | 0 refills | Status: DC | PRN

## 2017-11-14 MED ORDER — TIOTROPIUM BROMIDE MONOHYDRATE 2.5 MCG/ACT IN AERS: 2.0000 | INHALATION_SPRAY | Freq: Every day | RESPIRATORY_TRACT | 2 refills | Status: DC

## 2017-11-14 NOTE — Telephone Encounter (Signed)
Received call report from Truman Hayward with [facility] radiology for CT that was performed today.  Truman Hayward wanted to be sure that report was within epic. Report is within epic.   Routing to [place] for review.

## 2017-11-14 NOTE — Telephone Encounter (Signed)
Yes, I've seen. He has an apt today with me. Thanks

## 2017-11-14 NOTE — Progress Notes (Signed)
_0  ID: Scott Gomez, male    DOB: 14-Sep-1934, 83 y.o.   MRN: [id number]  Chief Complaint  Patient presents with  . Follow-up    feeling better, still SOB and cough    Referring provider: [name], [name], MD  HPI: 83 year old male, former smoker quit 1995. Follows with [name] for OSA. HX COPD, FEV1 78% in 2009 (not on inhaled maintance medication).   Seen for routine OSA visit on 10/21. Returned on 10/27 with complaints of sob and wheeze for 3 weeks. CXR showed soft tissue fullness concern for lymphadenopathy along the right side of the mediastinum. CT Chest with contrast showed infiltrative right perihilar 8.5 cm lung mass with occluded right upper lobe bronchus and narrowed right mainstem bronchus. Irregular solid 3.2 cm medial right upper lobe lung mass. CXR in Janurary 2019 showed clear lungs and normal mediastinum. Ordered for CT Chest with contrast.   11/16/2017 Patient presents today for 1 week follow-up sob/wheezing and to review CT Chest results. Accompanied by his wife. He is feeling better. Has been using Xopenex nebulizer every 4 hours and feels it's been helping. Has noticed some right neck swelling. No real difficulty swallowing, eating soft foods. He also has a sore tongue with fissure/cracks. States that his breathing prior to acute symptoms was ok. He did report experiencing shortness of breath with exertion. Denies fever, weight loss. Plan check spirometry today and ambulatory oxygen levels.    Spirometry 11/16/2017 FVC 2.1 (54%), FEV1 1.3 (50%), ratio 65%, FEF 25-75% 0.7 (39%) Moderate airway obstruction   Oxygen levels: O2 96% RA, amb 2 small laps 94% RA   Allergies  Allergen Reactions  . Buspirone Hcl     REACTION: itching  . Crestor [Rosuvastatin Calcium]     Increased his blood sugar   . Prednisone     REACTION: sick, per wife he can take this.    Immunization History  Administered Date(s) Administered  . Influenza Split 11/12/2010, 11/12/2011    . Influenza,inj,Quad PF,6+ Mos 11/11/2013, 03/17/2015, 10/13/2015, 11/19/2016  . Influenza-Unspecified 10/06/2012  . Pneumococcal Conjugate-13 03/23/2014  . Pneumococcal Polysaccharide-23 02/21/2009  . Td 08/21/2009    Past Medical History:  Diagnosis Date  . Anxiety   . Arthritis    osteoarthritis both hands  . Carpal tunnel syndrome   . Diabetes mellitus    type II  . Fatigue   . Hypercholesteremia   . Hypopotassemia   . Leg cramps   . Nephrolithiasis    Hx of  . Palpitations    Hx of   . Shoulder pain, left   . Snoring   . Testosterone deficiency   . Ulcer    peptic ulcer disease    Tobacco History: Social History   Tobacco Use  Smoking Status Former Smoker  . Packs/day: 2.00  . Years: 38.00  . Pack years: 76.00  . Types: Cigarettes  . Last attempt to quit: 01/14/1993  . Years since quitting: 24.8  Smokeless Tobacco Never Used   Counseling given: Not Answered   Outpatient Medications Prior to Visit  Medication Sig Dispense Refill  . albuterol (PROAIR HFA) 108 (90 Base) MCG/ACT inhaler Inhale 2 puffs into the lungs every 4 (four) hours as needed for wheezing or shortness of breath. 1 Inhaler 2  . alendronate (FOSAMAX) 70 MG tablet Take 70 mg by mouth once a week. Take with a full glass of water on an empty stomach.    . ALPRAZolam (XANAX) 0.5 MG tablet  TAKE ONE (1) TABLET BY MOUTH TWO (2) TIMES DAILY AS NEEDED FOR ANXIETY 60 tablet 3  . azithromycin (ZITHROMAX) 250 MG tablet Zpack taper as directed 6 tablet 0  . Blood Glucose Monitoring Suppl (ONETOUCH VERIO IQ SYSTEM) w/Device KIT Use to test blood sugar once daily and as needed dx R73.9 1 kit 0  . cyclobenzaprine (FLEXERIL) 10 MG tablet Take 10 mg by mouth 3 (three) times daily as needed. For muscle spasms     . docusate sodium (COLACE) 100 MG capsule Take 100 mg by mouth daily.    Marland Kitchen gabapentin (NEURONTIN) 100 MG capsule Take 200 mg by mouth 3 (three) times daily.     Marland Kitchen glucose blood (ONETOUCH VERIO) test  strip USE TO CHECK BLOOD SUGAR ONCE DAILY 100 each 3  . levalbuterol (XOPENEX) 0.63 MG/3ML nebulizer solution Take 3 mLs (0.63 mg total) by nebulization every 4 (four) hours as needed for wheezing or shortness of breath. 3 mL 12  . lisinopril (PRINIVIL,ZESTRIL) 10 MG tablet Take 1 tablet (10 mg total) by mouth daily. 90 tablet 3  . ONE TOUCH LANCETS MISC Use to test blood sugar once daily and as needed. Dx 790.29 200 each 1  . Oxycodone HCl 10 MG TABS Take 1 tablet by mouth 2 (two) times daily as needed (pain).     . potassium chloride SA (K-DUR,KLOR-CON) 20 MEQ tablet TAKE ONE (1) TABLET BY MOUTH EVERY DAY 90 tablet 3  . predniSONE (DELTASONE) 10 MG tablet Take 2 tabs x 5 days 10 tablet 0  . travoprost, benzalkonium, (TRAVATAN) 0.004 % ophthalmic solution Place 1 drop into both eyes at bedtime.       No facility-administered medications prior to visit.     Review of Systems  Review of Systems  Constitutional: Negative for chills, diaphoresis, fever and unexpected weight change.  HENT: Negative for trouble swallowing.        Right neck swelling Tongue sore with cracks   Respiratory: Positive for cough and shortness of breath. Negative for wheezing.   Cardiovascular: Negative.     Physical Exam  BP (!) 152/90 (BP Location: Left Arm, Cuff Size: Normal)   Pulse 75   Temp 98.1 F (36.7 C)   Ht 5' 8.75" (1.746 m)   Wt 207 lb 9.6 oz (94.2 kg)   SpO2 96%   BMI 30.88 kg/m  Physical Exam  Constitutional: He is oriented to person, place, and time. He appears well-developed and well-nourished. No distress.  HENT:  Head: Normocephalic and atraumatic.  Mouth/Throat: Uvula is midline and oropharynx is clear and moist. Mucous membranes are dry.  Tongue with fissures/cracks  Eyes: Pupils are equal, round, and reactive to light. EOM are normal.  Neck: Normal range of motion. Neck supple.  Cardiovascular: Normal rate and regular rhythm.  Pulmonary/Chest: Effort normal. No stridor. No  respiratory distress. He has no wheezes.  CTA O2 sat 96% RA  Musculoskeletal: Normal range of motion.  Lymphadenopathy:    He has cervical adenopathy.       Right cervical: Deep cervical adenopathy present.  Neurological: He is alert and oriented to person, place, and time.  Skin: Skin is warm and dry. No rash noted. No erythema.  Psychiatric: He has a normal mood and affect. His behavior is normal. Judgment and thought content normal.     Lab Results:  CBC    Component Value Date/Time   WBC 16.0 (H) 11/07/2017 1510   RBC 4.78 11/07/2017 1510   HGB 13.9  11/07/2017 1510   HCT 41.4 11/07/2017 1510   PLT 249.0 11/07/2017 1510   MCV 86.5 11/07/2017 1510   MCH 28.1 07/30/2015 1039   MCHC 33.5 11/07/2017 1510   RDW 13.5 11/07/2017 1510   LYMPHSABS 1.7 11/07/2017 1510   MONOABS 1.9 (H) 11/07/2017 1510   EOSABS 0.0 11/07/2017 1510   BASOSABS 0.1 11/07/2017 1510    BMET    Component Value Date/Time   NA 133 (L) 11/07/2017 1510   K 4.3 11/07/2017 1510   CL 96 11/07/2017 1510   CO2 28 11/07/2017 1510   GLUCOSE 156 (H) 11/07/2017 1510   BUN 16 11/07/2017 1510   CREATININE 0.89 11/07/2017 1510   CALCIUM 9.7 11/07/2017 1510   GFRNONAA >60 07/30/2015 1039   GFRAA >60 07/30/2015 1039    BNP No results found for: BNP  ProBNP No results found for: PROBNP  Imaging: Dg Chest 2 View  Result Date: 11/07/2017 CLINICAL DATA:  83 year old with mid sternal chest pain and cough. Shortness of breath. EXAM: CHEST - 2 VIEW COMPARISON:  Chest radiograph 01/28/2017 FINDINGS: Increased soft tissue densities in the right paratracheal and right side of the mediastinum. In addition, there appears to be soft tissue fullness in the right hilar region on the lateral view. Tracheal deviation towards the left which is new. Left lung is clear. Right lung base is clear. Heart size is normal. Atherosclerotic calcifications at the aortic arch. IMPRESSION: Soft tissue fullness and concern for  lymphadenopathy along the right side of the mediastinum. This represents a change from the previous examination. Recommend a chest CT with IV contrast to evaluate for a neoplastic process. These results will be called to the ordering clinician or representative by the Radiologist Assistant, and communication documented in the PACS or zVision Dashboard. Electronically Signed   By: Markus Daft M.D.   On: 11/07/2017 15:31   Ct Chest W Contrast  Result Date: 11/14/2017 CLINICAL DATA:  Dyspnea.  Abnormal chest radiograph. EXAM: CT CHEST WITH CONTRAST TECHNIQUE: Multidetector CT imaging of the chest was performed during intravenous contrast administration. CONTRAST:  62m ISOVUE-300 IOPAMIDOL (ISOVUE-300) INJECTION 61% COMPARISON:  11/07/2017 chest radiograph. FINDINGS: Cardiovascular: Normal heart size. No significant pericardial effusion/thickening. Three-vessel coronary atherosclerosis. Atherosclerotic thoracic aorta with 4.5 cm ascending thoracic aortic aneurysm. Dilated main pulmonary artery (3.5 cm diameter). Narrowing of the right pulmonary artery and occlusion of the right upper lobe pulmonary artery branch. Mild-to-moderate narrowing of the SVC, which remains patent. No central pulmonary emboli. Mediastinum/Nodes: Hypodense 1.0 cm posterior right thyroid lobe nodule. Unremarkable esophagus. No axillary adenopathy. Multiple enlarged right supraclavicular nodes, largest 1.8 cm (series 2/image 3). Infiltrative right paratracheal nodal conglomerate measuring 8.4 x 6.6 cm (series 2/image 37), in casing the right brachiocephalic artery, right subclavian artery proximally and right common carotid artery proximally. Enlarged 1.5 cm high left mediastinal prevascular node (series 2/image 28). Enlarged 1.7 cm subcarinal node (series 2/image 79). Enlarged 1.0 cm left hilar node (series 2/image 76). Lungs/Pleura: No pneumothorax. Trace dependent right pleural effusion. No left pleural effusion. Infiltrative right perihilar  8.5 x 6.5 cm lung mass (series 2/image 58), which is contiguous with infiltrative bulky right paratracheal nodal conglomerate. Narrowing of right mainstem bronchus and occlusion of the right upper lobe bronchus by the mass. Irregular solid 3.2 x 2.8 cm medial right upper lobe lung mass (series 3/image 33). Calcified 4 mm posterior left upper lobe granuloma. No additional significant pulmonary nodules. Upper abdomen: No acute abnormality. Musculoskeletal: No aggressive appearing focal osseous lesions. Marked  thoracic spondylosis. Moderate L1 vertebral compression fracture, chronic and stable since 08/06/2017 lumbar spine MRI. IMPRESSION: 1. Infiltrative right perihilar 8.5 cm lung mass with occluded right upper lobe bronchus and narrowed right mainstem bronchus. 2. Irregular solid 3.2 cm medial right upper lobe lung mass. 3. Bulky infiltrative right paratracheal nodal conglomerate. Additional pathologic lymph nodes in the high left prevascular mediastinum, left hilum and right supraclavicular region. 4. Findings are compatible with primary bronchogenic right lung carcinoma with bilateral hilar, bilateral mediastinal and right supraclavicular nodal metastases. PET-CT, tissue sampling and multidisciplinary thoracic oncology consultation are warranted. 5. Mild-to-moderate narrowing of the SVC, which remains patent. 6. Trace dependent right pleural effusion. 7. Three-vessel coronary atherosclerosis. 8. Ascending thoracic aortic 4.5 cm aneurysm. Ascending thoracic aortic aneurysm. Recommend semi-annual imaging followup by CTA or MRA and referral to cardiothoracic surgery if not already obtained. This recommendation follows 2010 ACCF/AHA/AATS/ACR/ASA/SCA/SCAI/SIR/STS/SVM Guidelines for the Diagnosis and Management of Patients With Thoracic Aortic Disease. Circulation. 2010; 121: M010-U725. 9. Dilated main pulmonary artery, suggesting pulmonary arterial hypertension. Aortic Atherosclerosis (ICD10-I70.0). These results will  be called to the ordering clinician or representative by the Radiologist Assistant, and communication documented in the PACS or zVision Dashboard. Electronically Signed   By: Ilona Sorrel M.D.   On: 11/14/2017 13:06     Assessment & Plan:   Mass of upper lobe of right lung - New right perihilar lung mass measuring 8.5cm occluding right upper lobe bronchus and narrowing right mainstem bronchus. Irregular solid 3.2 medial right upper lobe lung mass.  - Reviewed image with Dr. Lamonte Sakai in office, consideration for EBUS vs bronchoscopy for lung bx. Will discuss further with [name].  - Encouraged aspiration precaution and soft/solids with patient - Instructed patient to presents to ED if develops worsening sob, wheezing or difficulty swallowing    COPD (chronic obstructive pulmonary disease) - FEV1 in 2009, 78% - FEV1 2019, 50%  - Spirometry 11/14/2017 showed moderate airway obstruction, likely from mass. However, could benefit from starting Spiriva respimat daily (samples given). Continue xopenex nebs q4-6 hrs prn sob/wheezing     Martyn Ehrich, NP 11/16/2017

## 2017-11-14 NOTE — Telephone Encounter (Signed)
Noted.  Will close encounter.  

## 2017-11-14 NOTE — Patient Instructions (Addendum)
Continue Xopenex nebulizer every 4-6 hours as needed for sob/wheeze  Start Spiriva, take 2 puffs daily in the morning   Will call you end of the day today or Monday when I speak with [name] further about your CT scan results  Follow-up with [name] next available and NP in 2-4 weeks

## 2017-11-16 ENCOUNTER — Encounter: Payer: Self-pay | Admitting: Primary Care

## 2017-11-16 DIAGNOSIS — C3490 Malignant neoplasm of unspecified part of unspecified bronchus or lung: Secondary | ICD-10-CM | POA: Insufficient documentation

## 2017-11-16 NOTE — Assessment & Plan Note (Addendum)
-   New right perihilar lung mass measuring 8.5cm occluding right upper lobe bronchus and narrowing right mainstem bronchus. Irregular solid 3.2 medial right upper lobe lung mass.  - Reviewed image with Dr. Lamonte Sakai in office, consideration for EBUS vs bronchoscopy for lung bx. Will discuss further with [name].  - Encouraged aspiration precaution and soft/solids with patient - Instructed patient to presents to ED if develops worsening sob, wheezing or difficulty swallowing  - FU in 2-4 weeks

## 2017-11-16 NOTE — Assessment & Plan Note (Addendum)
-   FEV1 in 2009, 78% - FEV1 2019, 50%  - Spirometry 11/14/2017 showed moderate airway obstruction, likely from mass. However, could benefit from starting Spiriva respimat daily (samples given). Continue xopenex nebs q4-6 hrs prn sob/wheezing

## 2017-11-17 ENCOUNTER — Other Ambulatory Visit: Payer: Self-pay

## 2017-11-17 DIAGNOSIS — R590 Localized enlarged lymph nodes: Secondary | ICD-10-CM

## 2017-11-17 DIAGNOSIS — R911 Solitary pulmonary nodule: Secondary | ICD-10-CM

## 2017-11-18 ENCOUNTER — Encounter: Payer: Self-pay | Admitting: Family Medicine

## 2017-11-18 ENCOUNTER — Ambulatory Visit (INDEPENDENT_AMBULATORY_CARE_PROVIDER_SITE_OTHER): Payer: Medicare Other | Admitting: Family Medicine

## 2017-11-18 VITALS — BP 128/80 | HR 67 | Temp 98.1°F | Ht 68.75 in | Wt 206.2 lb

## 2017-11-18 DIAGNOSIS — Z23 Encounter for immunization: Secondary | ICD-10-CM | POA: Diagnosis not present

## 2017-11-18 DIAGNOSIS — R918 Other nonspecific abnormal finding of lung field: Secondary | ICD-10-CM | POA: Diagnosis not present

## 2017-11-18 DIAGNOSIS — J441 Chronic obstructive pulmonary disease with (acute) exacerbation: Secondary | ICD-10-CM

## 2017-11-18 DIAGNOSIS — I719 Aortic aneurysm of unspecified site, without rupture: Secondary | ICD-10-CM | POA: Insufficient documentation

## 2017-11-18 DIAGNOSIS — I712 Thoracic aortic aneurysm, without rupture, unspecified: Secondary | ICD-10-CM

## 2017-11-18 DIAGNOSIS — B370 Candidal stomatitis: Secondary | ICD-10-CM | POA: Insufficient documentation

## 2017-11-18 DIAGNOSIS — I10 Essential (primary) hypertension: Secondary | ICD-10-CM

## 2017-11-18 MED ORDER — MAGIC MOUTHWASH W/LIDOCAINE: 5.0000 mL | Freq: Three times a day (TID) | ORAL | 0 refills | Status: DC | PRN

## 2017-11-18 NOTE — Assessment & Plan Note (Signed)
For biopsy next week and then PET scan  Most likely bronchogenic lung carcinoma  Answered questions I could Pt now has some mild hemoptysis  Sob/cough are improved

## 2017-11-18 NOTE — Assessment & Plan Note (Signed)
Magic mouthwash px given

## 2017-11-18 NOTE — Progress Notes (Signed)
Subjective:    Patient ID: Scott Gomez, male    DOB: 23-Jan-1934, 83 y.o.   MRN: [id number]  HPI Here for f/u of pulmonary issue   Had visit with pulmonary on 10/21 for osa and noted cough  Then re visit on 10/25 for worsening cough/sob and wheezing with h/o copd  Also temp 99.0 Had been tx with zpak and prednisone previously  CXR was done showing adenopathy along R side of mediastinum  CT showed mass of upper lobe of R lung (occl right upper lobe bronchus and narrowing R mainstemp bronchus, also medial R upper lung mass)   Dg Chest 2 View  Result Date: 11/07/2017 CLINICAL DATA:  83 year old with mid sternal chest pain and cough. Shortness of breath. EXAM: CHEST - 2 VIEW COMPARISON:  Chest radiograph 01/28/2017 FINDINGS: Increased soft tissue densities in the right paratracheal and right side of the mediastinum. In addition, there appears to be soft tissue fullness in the right hilar region on the lateral view. Tracheal deviation towards the left which is new. Left lung is clear. Right lung base is clear. Heart size is normal. Atherosclerotic calcifications at the aortic arch. IMPRESSION: Soft tissue fullness and concern for lymphadenopathy along the right side of the mediastinum. This represents a change from the previous examination. Recommend a chest CT with IV contrast to evaluate for a neoplastic process. These results will be called to the ordering clinician or representative by the Radiologist Assistant, and communication documented in the PACS or zVision Dashboard. Electronically Signed   By: Markus Daft M.D.   On: 11/07/2017 15:31   Ct Chest W Contrast  Result Date: 11/14/2017 CLINICAL DATA:  Dyspnea.  Abnormal chest radiograph. EXAM: CT CHEST WITH CONTRAST TECHNIQUE: Multidetector CT imaging of the chest was performed during intravenous contrast administration. CONTRAST:  39m ISOVUE-300 IOPAMIDOL (ISOVUE-300) INJECTION 61% COMPARISON:  11/07/2017 chest radiograph. FINDINGS:  Cardiovascular: Normal heart size. No significant pericardial effusion/thickening. Three-vessel coronary atherosclerosis. Atherosclerotic thoracic aorta with 4.5 cm ascending thoracic aortic aneurysm. Dilated main pulmonary artery (3.5 cm diameter). Narrowing of the right pulmonary artery and occlusion of the right upper lobe pulmonary artery branch. Mild-to-moderate narrowing of the SVC, which remains patent. No central pulmonary emboli. Mediastinum/Nodes: Hypodense 1.0 cm posterior right thyroid lobe nodule. Unremarkable esophagus. No axillary adenopathy. Multiple enlarged right supraclavicular nodes, largest 1.8 cm (series 2/image 3). Infiltrative right paratracheal nodal conglomerate measuring 8.4 x 6.6 cm (series 2/image 37), in casing the right brachiocephalic artery, right subclavian artery proximally and right common carotid artery proximally. Enlarged 1.5 cm high left mediastinal prevascular node (series 2/image 28). Enlarged 1.7 cm subcarinal node (series 2/image 79). Enlarged 1.0 cm left hilar node (series 2/image 76). Lungs/Pleura: No pneumothorax. Trace dependent right pleural effusion. No left pleural effusion. Infiltrative right perihilar 8.5 x 6.5 cm lung mass (series 2/image 58), which is contiguous with infiltrative bulky right paratracheal nodal conglomerate. Narrowing of right mainstem bronchus and occlusion of the right upper lobe bronchus by the mass. Irregular solid 3.2 x 2.8 cm medial right upper lobe lung mass (series 3/image 33). Calcified 4 mm posterior left upper lobe granuloma. No additional significant pulmonary nodules. Upper abdomen: No acute abnormality. Musculoskeletal: No aggressive appearing focal osseous lesions. Marked thoracic spondylosis. Moderate L1 vertebral compression fracture, chronic and stable since 08/06/2017 lumbar spine MRI. IMPRESSION: 1. Infiltrative right perihilar 8.5 cm lung mass with occluded right upper lobe bronchus and narrowed right mainstem bronchus. 2.  Irregular solid 3.2 cm medial right upper lobe lung  mass. 3. Bulky infiltrative right paratracheal nodal conglomerate. Additional pathologic lymph nodes in the high left prevascular mediastinum, left hilum and right supraclavicular region. 4. Findings are compatible with primary bronchogenic right lung carcinoma with bilateral hilar, bilateral mediastinal and right supraclavicular nodal metastases. PET-CT, tissue sampling and multidisciplinary thoracic oncology consultation are warranted. 5. Mild-to-moderate narrowing of the SVC, which remains patent. 6. Trace dependent right pleural effusion. 7. Three-vessel coronary atherosclerosis. 8. Ascending thoracic aortic 4.5 cm aneurysm. Ascending thoracic aortic aneurysm. Recommend semi-annual imaging followup by CTA or MRA and referral to cardiothoracic surgery if not already obtained. This recommendation follows 2010 ACCF/AHA/AATS/ACR/ASA/SCA/SCAI/SIR/STS/SVM Guidelines for the Diagnosis and Management of Patients With Thoracic Aortic Disease. Circulation. 2010; 121: T364-W803. 9. Dilated main pulmonary artery, suggesting pulmonary arterial hypertension. Aortic Atherosclerosis (ICD10-I70.0). These results will be called to the ordering clinician or representative by the Radiologist Assistant, and communication documented in the PACS or zVision Dashboard. Electronically Signed   By: Ilona Sorrel M.D.   On: 11/14/2017 13:06     incidental ascending thoracic aortic 4.5 cm aneurysm   Pulmonary plan - consideration for EBUS vs bronchoscopy for lung bx  PET scan ordered as well   Pt is former smoker 76 pack years -quitting in 1995 tx for copd with spiriva inlaler (that is helping)  Also has proair  Wt Readings from Last 3 Encounters:  11/18/17 206 lb 4 oz (93.6 kg)  11/14/17 207 lb 9.6 oz (94.2 kg)  11/07/17 208 lb (94.3 kg)   30.68 kg/m  Highest wt was 222 in jan 2019 Lost wt from April to June   BP Readings from Last 3 Encounters:  11/18/17 128/80    11/14/17 (!) 152/90  11/07/17 (!) 144/84   Biopsy is scheduled Monday  PET scan wed   Notices swelling in right side of neck- small  Breathing a whole lot better and feels better  No more fever  No chest or abdominal pain   Cough is less-though is coughing up blood   Brother had kidney cancer Sister also kidney cancer  Other sister with breast cancer and CAD   Appetite is fair /not great   Patient Active Problem List   Diagnosis Date Noted  . Thrush 11/18/2017  . Aortic aneurysm ([place]) 11/18/2017  . Mass of upper lobe of right lung 11/16/2017  . Abnormal CXR (chest x-ray) 11/07/2017  . GERD (gastroesophageal reflux disease) 11/07/2017  . Routine general medical examination at a health care facility 04/09/2015  . Osteoporosis 04/07/2015  . Constipation 05/16/2014  . Polyneuropathy 03/23/2014  . Encounter for Medicare annual wellness exam 11/25/2012  . Spinal stenosis 08/27/2011  . Vitamin B 12 deficiency 08/06/2011  . Obesity 05/13/2011  . Falls frequently 05/01/2011  . Prostate cancer screening 11/07/2010  . DEPRESSION 02/26/2010  . Essential hypertension, benign 03/29/2008  . COPD (chronic obstructive pulmonary disease) ([place]) 07/08/2007  . Sleep apnea 07/08/2007  . TESTOSTERONE DEFICIENCY 06/22/2007  . Prediabetes 01/07/2007  . Generalized anxiety disorder 01/07/2007  . CARPAL TUNNEL SYNDROME 01/07/2007  . PEPTIC ULCER DISEASE 01/07/2007  . OSTEOARTHRITIS, HANDS, BILATERAL 01/07/2007  . SHOULDER PAIN, LEFT, CHRONIC 01/07/2007  . PALPITATIONS, HX OF 01/07/2007  . Nephrolithiasis 01/07/2007   Past Medical History:  Diagnosis Date  . Anxiety   . Arthritis    osteoarthritis both hands  . Carpal tunnel syndrome   . Diabetes mellitus    type II  . Fatigue   . Hypercholesteremia   . Hypopotassemia   . Leg cramps   .  Nephrolithiasis    Hx of  . Palpitations    Hx of   . Shoulder pain, left   . Snoring   . Testosterone deficiency   . Ulcer    peptic  ulcer disease   Past Surgical History:  Procedure Laterality Date  . BACK SURGERY  1978   x2 disc   Social History   Tobacco Use  . Smoking status: Former Smoker    Packs/day: 2.00    Years: 38.00    Pack years: 76.00    Types: Cigarettes    Last attempt to quit: 01/14/1993    Years since quitting: 24.8  . Smokeless tobacco: Never Used  Substance Use Topics  . Alcohol use: No    Alcohol/week: 0.0 standard drinks  . Drug use: No   Family History  Problem Relation Age of Onset  . Cancer Brother        kidney  . Heart disease Brother        MI  . Hypertension Mother   . Allergies Mother   . Hypertension Father   . Allergies Father   . Cancer Sister   . Emphysema Sister   . Heart attack Sister    Allergies  Allergen Reactions  . Buspirone Hcl     REACTION: itching  . Crestor [Rosuvastatin Calcium]     Increased his blood sugar   . Prednisone     REACTION: sick, per wife he can take this.   Current Outpatient Medications on File Prior to Visit  Medication Sig Dispense Refill  . albuterol (PROAIR HFA) 108 (90 Base) MCG/ACT inhaler Inhale 2 puffs into the lungs every 4 (four) hours as needed for wheezing or shortness of breath. 1 Inhaler 2  . alendronate (FOSAMAX) 70 MG tablet Take 70 mg by mouth once a week. Take with a full glass of water on an empty stomach.    . ALPRAZolam (XANAX) 0.5 MG tablet TAKE ONE (1) TABLET BY MOUTH TWO (2) TIMES DAILY AS NEEDED FOR ANXIETY 60 tablet 3  . Blood Glucose Monitoring Suppl (ONETOUCH VERIO IQ SYSTEM) w/Device KIT Use to test blood sugar once daily and as needed dx R73.9 1 kit 0  . cyclobenzaprine (FLEXERIL) 10 MG tablet Take 10 mg by mouth 3 (three) times daily as needed. For muscle spasms     . docusate sodium (COLACE) 100 MG capsule Take 100 mg by mouth daily.    Marland Kitchen gabapentin (NEURONTIN) 100 MG capsule Take 200 mg by mouth 3 (three) times daily.     Marland Kitchen glucose blood (ONETOUCH VERIO) test strip USE TO CHECK BLOOD SUGAR ONCE DAILY  100 each 3  . levalbuterol (XOPENEX) 0.63 MG/3ML nebulizer solution Take 3 mLs (0.63 mg total) by nebulization every 4 (four) hours as needed for wheezing or shortness of breath. 3 mL 12  . lisinopril (PRINIVIL,ZESTRIL) 10 MG tablet Take 1 tablet (10 mg total) by mouth daily. 90 tablet 3  . ONE TOUCH LANCETS MISC Use to test blood sugar once daily and as needed. Dx 790.29 200 each 1  . Oxycodone HCl 10 MG TABS Take 1 tablet by mouth 2 (two) times daily as needed (pain).     . potassium chloride SA (K-DUR,KLOR-CON) 20 MEQ tablet TAKE ONE (1) TABLET BY MOUTH EVERY DAY 90 tablet 3  . Tiotropium Bromide Monohydrate (SPIRIVA RESPIMAT) 2.5 MCG/ACT AERS Inhale 2 puffs into the lungs daily. 4 g 2  . travoprost, benzalkonium, (TRAVATAN) 0.004 % ophthalmic solution Place 1 drop  into both eyes at bedtime.       No current facility-administered medications on file prior to visit.     Review of Systems  Constitutional: Positive for fatigue. Negative for activity change, appetite change, fever and unexpected weight change.  HENT: Negative for congestion, rhinorrhea, sore throat and trouble swallowing.        Thrush  Eyes: Negative for pain, redness, itching and visual disturbance.  Respiratory: Positive for cough. Negative for chest tightness, shortness of breath and wheezing.        Sob and wheezing is much improved  Cardiovascular: Negative for chest pain and palpitations.  Gastrointestinal: Negative for abdominal pain, blood in stool, constipation, diarrhea and nausea.  Endocrine: Negative for cold intolerance, heat intolerance, polydipsia and polyuria.  Genitourinary: Negative for difficulty urinating, dysuria, frequency and urgency.  Musculoskeletal: Negative for arthralgias, joint swelling and myalgias.  Skin: Negative for pallor and rash.  Neurological: Negative for dizziness, tremors, weakness, numbness and headaches.  Hematological: Negative for adenopathy. Does not bruise/bleed easily.   Psychiatric/Behavioral: Negative for decreased concentration and dysphoric mood. The patient is not nervous/anxious.        Objective:   Physical Exam  Constitutional: He appears well-developed and well-nourished. No distress.  overwt and well app  HENT:  Head: Normocephalic and atraumatic.  Mouth/Throat: Oropharynx is clear and moist.  Scant pale coating on tongue   Eyes: Pupils are equal, round, and reactive to light. Conjunctivae and EOM are normal.  Neck: Normal range of motion. Neck supple. No JVD present. Carotid bruit is not present. No thyromegaly present.  Cardiovascular: Normal rate, regular rhythm, normal heart sounds and intact distal pulses. Exam reveals no gallop.  Pulmonary/Chest: Effort normal and breath sounds normal. No respiratory distress. He has no wheezes. He has no rales.  No crackles No wheeze or rales   occ cough  Not sob   Abdominal: Soft. Bowel sounds are normal. He exhibits no distension, no abdominal bruit, no pulsatile midline mass and no mass. There is no tenderness.  Musculoskeletal: He exhibits no edema.  Lymphadenopathy:    He has no cervical adenopathy.       Right: Supraclavicular adenopathy present.  Neurological: He is alert. He has normal reflexes. He displays normal reflexes. No cranial nerve deficit. Coordination normal.  Skin: Skin is warm and dry. No rash noted.  Ruddy complexion  Psychiatric: His mood appears anxious.  Mildly anxious as expected given recent dx with lung mass  Pleasant  Supportive spouse present   Nursing note and vitals reviewed.         Assessment & Plan:   Problem List Items Addressed This Visit      Cardiovascular and Mediastinum   Aortic aneurysm ([place])    4.5 cm ascending thoracic aneurysm  Incidental finding on CT Recommended semi annual imaging by CTA or MRA and ref to cardiothoracic surgery  No symptoms In midst of w/u for possible lung cancer /large lung masses Will hold off on further eval for  now - may come into play if surgery is needed  Good bp and cholesterol control important      Essential hypertension, benign    bp in fair control at this time  BP Readings from Last 1 Encounters:  11/18/17 128/80   No changes needed Most recent labs reviewed  Disc lifstyle change with low sodium diet and exercise          Respiratory   COPD (chronic obstructive pulmonary disease) ([place])  Doing better with spiriva  In midst of w/u for lung masses       Relevant Medications   magic mouthwash w/lidocaine SOLN     Digestive   Thrush    Magic mouthwash px given      Relevant Medications   magic mouthwash w/lidocaine SOLN     Other   Mass of upper lobe of right lung - Primary    For biopsy next week and then PET scan  Most likely bronchogenic lung carcinoma  Answered questions I could Pt now has some mild hemoptysis  Sob/cough are improved        Other Visit Diagnoses    Need for influenza vaccination       Relevant Orders   Flu Vaccine QUAD 6+ mos PF IM (Fluarix Quad PF) (Completed)

## 2017-11-18 NOTE — Assessment & Plan Note (Signed)
4.5 cm ascending thoracic aneurysm  Incidental finding on CT Recommended semi annual imaging by CTA or MRA and ref to cardiothoracic surgery  No symptoms In midst of w/u for possible lung cancer /large lung masses Will hold off on further eval for now - may come into play if surgery is needed  Good bp and cholesterol control important

## 2017-11-18 NOTE — Patient Instructions (Addendum)
Get the magic mouthwash filled - it if does not help, please let me know   Continue follow up with pulmonary   We will follow the aortic aneurysm once we know what is going to happen with the lungs   Flu shot today   Eat regularly and be sure to get enough fluids

## 2017-11-18 NOTE — Assessment & Plan Note (Signed)
bp in fair control at this time  BP Readings from Last 1 Encounters:  11/18/17 128/80   No changes needed Most recent labs reviewed  Disc lifstyle change with low sodium diet and exercise

## 2017-11-18 NOTE — Assessment & Plan Note (Signed)
Doing better with spiriva  In midst of w/u for lung masses

## 2017-11-20 ENCOUNTER — Telehealth: Payer: Self-pay

## 2017-11-20 ENCOUNTER — Other Ambulatory Visit: Payer: Self-pay | Admitting: Radiology

## 2017-11-20 NOTE — Telephone Encounter (Signed)
The pressure from the lung tumor may be worsening some of this  If fever or sob alert me and pulmonary   Avoid carbonation or drinking through a straw (they worsen gas and burping)  Eat and drink slowly - small bites   For cough-if he is not already taking anything I recommend delsym over the counter as directed (careful of sedation)   Let me know if this is not helpful  Also he can reach out to the pulmonary office if needed

## 2017-11-20 NOTE — Telephone Encounter (Signed)
Scott Gomez ([place] signed) said pt was seen on 11/18/17 and pt is still coughing; is not seeing any blood in phlegm. Pt is also burping a lot even if just drinks water. No SOB or difficulty breathing when sitting but if burps the burp does not come out and feels like choking him. Last until pt can force a burp. Wants to know what to do.pt has appt on 11/24/17 for biopsy. Pt's wife request cb.

## 2017-11-20 NOTE — Telephone Encounter (Signed)
Wife notified of Dr. Marliss Coots comments and recommendations and verbalized understanding

## 2017-11-21 ENCOUNTER — Other Ambulatory Visit: Payer: Self-pay | Admitting: Radiology

## 2017-11-24 ENCOUNTER — Telehealth: Payer: Self-pay | Admitting: Pulmonary Disease

## 2017-11-24 ENCOUNTER — Ambulatory Visit (HOSPITAL_BASED_OUTPATIENT_CLINIC_OR_DEPARTMENT_OTHER)
Admission: RE | Admit: 2017-11-24 | Discharge: 2017-11-24 | Disposition: A | Discharge status: Home / Self Care | Payer: Medicare Other | Source: Ambulatory Visit | Attending: Diagnostic Radiology | Admitting: Diagnostic Radiology

## 2017-11-24 ENCOUNTER — Ambulatory Visit (HOSPITAL_COMMUNITY)
Admission: RE | Admit: 2017-11-24 | Discharge: 2017-11-24 | Disposition: A | Discharge status: Home / Self Care | Payer: Medicare Other | Source: Ambulatory Visit | Attending: Pulmonary Disease | Admitting: Pulmonary Disease

## 2017-11-24 ENCOUNTER — Encounter (HOSPITAL_COMMUNITY): Payer: Self-pay

## 2017-11-24 DIAGNOSIS — I82409 Acute embolism and thrombosis of unspecified deep veins of unspecified lower extremity: Secondary | ICD-10-CM

## 2017-11-24 DIAGNOSIS — I2510 Atherosclerotic heart disease of native coronary artery without angina pectoris: Secondary | ICD-10-CM | POA: Diagnosis not present

## 2017-11-24 DIAGNOSIS — J90 Pleural effusion, not elsewhere classified: Secondary | ICD-10-CM | POA: Diagnosis not present

## 2017-11-24 DIAGNOSIS — Z87891 Personal history of nicotine dependence: Secondary | ICD-10-CM | POA: Insufficient documentation

## 2017-11-24 DIAGNOSIS — J449 Chronic obstructive pulmonary disease, unspecified: Secondary | ICD-10-CM | POA: Insufficient documentation

## 2017-11-24 DIAGNOSIS — G5600 Carpal tunnel syndrome, unspecified upper limb: Secondary | ICD-10-CM | POA: Insufficient documentation

## 2017-11-24 DIAGNOSIS — Z888 Allergy status to other drugs, medicaments and biological substances status: Secondary | ICD-10-CM | POA: Diagnosis not present

## 2017-11-24 DIAGNOSIS — R918 Other nonspecific abnormal finding of lung field: Secondary | ICD-10-CM | POA: Diagnosis not present

## 2017-11-24 DIAGNOSIS — R590 Localized enlarged lymph nodes: Secondary | ICD-10-CM

## 2017-11-24 DIAGNOSIS — C770 Secondary and unspecified malignant neoplasm of lymph nodes of head, face and neck: Secondary | ICD-10-CM | POA: Insufficient documentation

## 2017-11-24 DIAGNOSIS — E7800 Pure hypercholesterolemia, unspecified: Secondary | ICD-10-CM | POA: Diagnosis not present

## 2017-11-24 DIAGNOSIS — Z79899 Other long term (current) drug therapy: Secondary | ICD-10-CM | POA: Diagnosis not present

## 2017-11-24 DIAGNOSIS — E119 Type 2 diabetes mellitus without complications: Secondary | ICD-10-CM | POA: Insufficient documentation

## 2017-11-24 DIAGNOSIS — I712 Thoracic aortic aneurysm, without rupture: Secondary | ICD-10-CM | POA: Insufficient documentation

## 2017-11-24 DIAGNOSIS — I82C11 Acute embolism and thrombosis of right internal jugular vein: Secondary | ICD-10-CM | POA: Insufficient documentation

## 2017-11-24 DIAGNOSIS — F419 Anxiety disorder, unspecified: Secondary | ICD-10-CM | POA: Insufficient documentation

## 2017-11-24 DIAGNOSIS — G4733 Obstructive sleep apnea (adult) (pediatric): Secondary | ICD-10-CM | POA: Diagnosis not present

## 2017-11-24 DIAGNOSIS — Z8711 Personal history of peptic ulcer disease: Secondary | ICD-10-CM | POA: Insufficient documentation

## 2017-11-24 DIAGNOSIS — I82401 Acute embolism and thrombosis of unspecified deep veins of right lower extremity: Secondary | ICD-10-CM | POA: Diagnosis present

## 2017-11-24 LAB — PROTIME-INR
INR: 1.14
Prothrombin Time: 14.5 seconds (ref 11.4–15.2)

## 2017-11-24 LAB — CBC WITH DIFFERENTIAL/PLATELET
Abs Immature Granulocytes: 0.03 10*3/uL (ref 0.00–0.07)
BASOS ABS: 0 10*3/uL (ref 0.0–0.1)
BASOS PCT: 1 %
EOS ABS: 0.1 10*3/uL (ref 0.0–0.5)
EOS PCT: 1 %
HEMATOCRIT: 38.5 % — AB (ref 39.0–52.0)
Hemoglobin: 12.4 g/dL — ABNORMAL LOW (ref 13.0–17.0)
Immature Granulocytes: 1 %
LYMPHS ABS: 1.7 10*3/uL (ref 0.7–4.0)
Lymphocytes Relative: 26 %
MCH: 28.2 pg (ref 26.0–34.0)
MCHC: 32.2 g/dL (ref 30.0–36.0)
MCV: 87.5 fL (ref 80.0–100.0)
Monocytes Absolute: 0.7 10*3/uL (ref 0.1–1.0)
Monocytes Relative: 11 %
Neutro Abs: 4.1 10*3/uL (ref 1.7–7.7)
Neutrophils Relative %: 60 %
PLATELETS: 233 10*3/uL (ref 150–400)
RBC: 4.4 MIL/uL (ref 4.22–5.81)
RDW: 13.1 % (ref 11.5–15.5)
WBC: 6.6 10*3/uL (ref 4.0–10.5)
nRBC: 0 % (ref 0.0–0.2)

## 2017-11-24 LAB — GLUCOSE, CAPILLARY: GLUCOSE-CAPILLARY: 93 mg/dL (ref 70–99)

## 2017-11-24 MED ORDER — MIDAZOLAM HCL 2 MG/2ML IJ SOLN
INTRAMUSCULAR | Status: AC
  Filled 2017-11-24: qty 2

## 2017-11-24 MED ORDER — LIDOCAINE HCL 1 % IJ SOLN
INTRAMUSCULAR | Status: AC
  Filled 2017-11-24: qty 20

## 2017-11-24 MED ORDER — FENTANYL CITRATE (PF) 100 MCG/2ML IJ SOLN
INTRAMUSCULAR | Status: AC
  Filled 2017-11-24: qty 2

## 2017-11-24 MED ORDER — SODIUM CHLORIDE 0.9 % IV SOLN
INTRAVENOUS | Status: DC
  Administered 2017-11-24: 11:00:00 via INTRAVENOUS

## 2017-11-24 MED ORDER — LIDOCAINE HCL (PF) 1 % IJ SOLN
INTRAMUSCULAR | Status: AC | PRN
  Administered 2017-11-24: 10 mL

## 2017-11-24 MED ORDER — FENTANYL CITRATE (PF) 100 MCG/2ML IJ SOLN
INTRAMUSCULAR | Status: AC | PRN
  Administered 2017-11-24 (×4): 25 ug via INTRAVENOUS

## 2017-11-24 MED ORDER — MIDAZOLAM HCL 2 MG/2ML IJ SOLN
INTRAMUSCULAR | Status: AC | PRN
  Administered 2017-11-24 (×4): 0.5 mg via INTRAVENOUS

## 2017-11-24 NOTE — Consult Note (Signed)
Chief Complaint: Patient was seen in consultation today for image guided right supraclavicular lymph node biopsy  Referring Physician(s): Rigoberto Noel  Supervising Physician: Markus Daft  Patient Status: [facility] - Out-pt  History of Present Illness: Scott Gomez is a 83 y.o. male, ex-smoker, with history of COPD, obstructive sleep apnea, reflux/PUD and recent imaging revealing: 1. Infiltrative right perihilar 8.5 cm lung mass with occluded right upper lobe bronchus and narrowed right mainstem bronchus. 2. Irregular solid 3.2 cm medial right upper lobe lung mass. 3. Bulky infiltrative right paratracheal nodal conglomerate. Additional pathologic lymph nodes in the high left prevascular mediastinum, left hilum and right supraclavicular region. 4. Findings are compatible with primary bronchogenic right lung carcinoma with bilateral hilar, bilateral mediastinal and right supraclavicular nodal metastases. PET-CT, tissue sampling and multidisciplinary thoracic oncology consultation are warranted. 5. Mild-to-moderate narrowing of the SVC, which remains patent. 6. Trace dependent right pleural effusion. 7. Three-vessel coronary atherosclerosis. 8. Ascending thoracic aortic 4.5 cm aneurysm. Ascending thoracic aortic aneurysm  He presents today for image guided right supraclavicular lymph node biopsy for further evaluation.  He has no prior history of cancer.  Past Medical History:  Diagnosis Date  . Anxiety   . Arthritis    osteoarthritis both hands  . Carpal tunnel syndrome   . Diabetes mellitus    type II  . Fatigue   . Hypercholesteremia   . Hypopotassemia   . Leg cramps   . Nephrolithiasis    Hx of  . Palpitations    Hx of   . Shoulder pain, left   . Snoring   . Testosterone deficiency   . Ulcer    peptic ulcer disease    Past Surgical History:  Procedure Laterality Date  . BACK SURGERY  1978   x2 disc    Allergies: Buspirone hcl; Crestor [rosuvastatin  calcium]; and Prednisone  Medications: Prior to Admission medications   Medication Sig Start Date End Date Taking? Authorizing Provider  albuterol (PROAIR HFA) 108 (90 Base) MCG/ACT inhaler Inhale 2 puffs into the lungs every 4 (four) hours as needed for wheezing or shortness of breath. 11/03/17   Martyn Ehrich, NP  alendronate (FOSAMAX) 70 MG tablet Take 70 mg by mouth once a week. Take with a full glass of water on an empty stomach.    [provider]  ALPRAZolam (XANAX) 0.5 MG tablet TAKE ONE (1) TABLET BY MOUTH TWO (2) TIMES DAILY AS NEEDED FOR ANXIETY 08/18/17   [name], [name], MD  Blood Glucose Monitoring Suppl (ONETOUCH VERIO IQ SYSTEM) w/Device KIT Use to test blood sugar once daily and as needed dx R73.9 10/14/16   [name], [name], MD  cyclobenzaprine (FLEXERIL) 10 MG tablet Take 10 mg by mouth 3 (three) times daily as needed. For muscle spasms  11/19/10   [name], Roque Lias A, MD  docusate sodium (COLACE) 100 MG capsule Take 100 mg by mouth daily.    [provider]  gabapentin (NEURONTIN) 100 MG capsule Take 200 mg by mouth 3 (three) times daily.     [provider]  glucose blood (ONETOUCH VERIO) test strip USE TO CHECK BLOOD SUGAR ONCE DAILY 04/23/17   [name], [name], MD  levalbuterol (XOPENEX) 0.63 MG/3ML nebulizer solution Take 3 mLs (0.63 mg total) by nebulization every 4 (four) hours as needed for wheezing or shortness of breath. 11/07/17   Martyn Ehrich, NP  lisinopril (PRINIVIL,ZESTRIL) 10 MG tablet Take 1 tablet (10 mg total) by mouth daily. 04/14/17  [name], [name], MD  magic mouthwash w/lidocaine SOLN Take 5 mLs by mouth 3 (three) times daily as needed for mouth pain (swish and spit). 11/18/17   [name], [name], MD  ONE TOUCH LANCETS MISC Use to test blood sugar once daily and as needed. Dx 790.29 05/06/13   [name], [name], MD  Oxycodone HCl 10 MG TABS Take 1 tablet by mouth 2 (two) times daily as needed (pain).  10/28/12   [provider]    potassium chloride SA (K-DUR,KLOR-CON) 20 MEQ tablet TAKE ONE (1) TABLET BY MOUTH EVERY DAY 04/14/17   [name], [name], MD  Tiotropium Bromide Monohydrate (SPIRIVA RESPIMAT) 2.5 MCG/ACT AERS Inhale 2 puffs into the lungs daily. 11/14/17   Martyn Ehrich, NP  travoprost, benzalkonium, (TRAVATAN) 0.004 % ophthalmic solution Place 1 drop into both eyes at bedtime.      [provider]     Family History  Problem Relation Age of Onset  . Cancer Brother        kidney  . Heart disease Brother        MI  . Hypertension Mother   . Allergies Mother   . Hypertension Father   . Allergies Father   . Cancer Sister   . Emphysema Sister   . Heart attack Sister     Social History   Socioeconomic History  . Marital status: Married    Spouse name: Not on file  . Number of children: Not on file  . Years of education: Not on file  . Highest education level: Not on file  Occupational History  . Not on file  Social Needs  . Financial resource strain: Not on file  . Food insecurity:    Worry: Not on file    Inability: Not on file  . Transportation needs:    Medical: Not on file    Non-medical: Not on file  Tobacco Use  . Smoking status: Former Smoker    Packs/day: 2.00    Years: 38.00    Pack years: 76.00    Types: Cigarettes    Last attempt to quit: 01/14/1993    Years since quitting: 24.8  . Smokeless tobacco: Never Used  Substance and Sexual Activity  . Alcohol use: No    Alcohol/week: 0.0 standard drinks  . Drug use: No  . Sexual activity: Never  Lifestyle  . Physical activity:    Days per week: Not on file    Minutes per session: Not on file  . Stress: Not on file  Relationships  . Social connections:    Talks on phone: Not on file    Gets together: Not on file    Attends religious service: Not on file    Active member of club or organization: Not on file    Attends meetings of clubs or organizations: Not on file    Relationship status: Not on file  Other  Topics Concern  . Not on file  Social History Narrative   Lives with wife Thao Vanover   Retired     Review of Systems denies fever, headache, chest pain, abdominal/back pain, nausea, vomiting or bleeding.  He does have dyspnea, occasional cough, and reflux  Vital Signs: Blood pressure 136/71, heart rate 76, respirations 18, temp 98.8, O2 sat 96% room air   Physical Exam awake, alert. Chest clear to auscultation bilaterally.  Heart with regular rate and rhythm.  Abdomen soft, positive bowel sounds, nontender. No lower extremity edema.  Palpable  right supraclavicular adenopathy, nontender  Imaging: Dg Chest 2 View  Result Date: 11/07/2017 CLINICAL DATA:  83 year old with mid sternal chest pain and cough. Shortness of breath. EXAM: CHEST - 2 VIEW COMPARISON:  Chest radiograph 01/28/2017 FINDINGS: Increased soft tissue densities in the right paratracheal and right side of the mediastinum. In addition, there appears to be soft tissue fullness in the right hilar region on the lateral view. Tracheal deviation towards the left which is new. Left lung is clear. Right lung base is clear. Heart size is normal. Atherosclerotic calcifications at the aortic arch. IMPRESSION: Soft tissue fullness and concern for lymphadenopathy along the right side of the mediastinum. This represents a change from the previous examination. Recommend a chest CT with IV contrast to evaluate for a neoplastic process. These results will be called to the ordering clinician or representative by the Radiologist Assistant, and communication documented in the PACS or zVision Dashboard. Electronically Signed   By: Markus Daft M.D.   On: 11/07/2017 15:31   Ct Chest W Contrast  Result Date: 11/14/2017 CLINICAL DATA:  Dyspnea.  Abnormal chest radiograph. EXAM: CT CHEST WITH CONTRAST TECHNIQUE: Multidetector CT imaging of the chest was performed during intravenous contrast administration. CONTRAST:  65m ISOVUE-300 IOPAMIDOL  (ISOVUE-300) INJECTION 61% COMPARISON:  11/07/2017 chest radiograph. FINDINGS: Cardiovascular: Normal heart size. No significant pericardial effusion/thickening. Three-vessel coronary atherosclerosis. Atherosclerotic thoracic aorta with 4.5 cm ascending thoracic aortic aneurysm. Dilated main pulmonary artery (3.5 cm diameter). Narrowing of the right pulmonary artery and occlusion of the right upper lobe pulmonary artery branch. Mild-to-moderate narrowing of the SVC, which remains patent. No central pulmonary emboli. Mediastinum/Nodes: Hypodense 1.0 cm posterior right thyroid lobe nodule. Unremarkable esophagus. No axillary adenopathy. Multiple enlarged right supraclavicular nodes, largest 1.8 cm (series 2/image 3). Infiltrative right paratracheal nodal conglomerate measuring 8.4 x 6.6 cm (series 2/image 37), in casing the right brachiocephalic artery, right subclavian artery proximally and right common carotid artery proximally. Enlarged 1.5 cm high left mediastinal prevascular node (series 2/image 28). Enlarged 1.7 cm subcarinal node (series 2/image 79). Enlarged 1.0 cm left hilar node (series 2/image 76). Lungs/Pleura: No pneumothorax. Trace dependent right pleural effusion. No left pleural effusion. Infiltrative right perihilar 8.5 x 6.5 cm lung mass (series 2/image 58), which is contiguous with infiltrative bulky right paratracheal nodal conglomerate. Narrowing of right mainstem bronchus and occlusion of the right upper lobe bronchus by the mass. Irregular solid 3.2 x 2.8 cm medial right upper lobe lung mass (series 3/image 33). Calcified 4 mm posterior left upper lobe granuloma. No additional significant pulmonary nodules. Upper abdomen: No acute abnormality. Musculoskeletal: No aggressive appearing focal osseous lesions. Marked thoracic spondylosis. Moderate L1 vertebral compression fracture, chronic and stable since 08/06/2017 lumbar spine MRI. IMPRESSION: 1. Infiltrative right perihilar 8.5 cm lung mass with  occluded right upper lobe bronchus and narrowed right mainstem bronchus. 2. Irregular solid 3.2 cm medial right upper lobe lung mass. 3. Bulky infiltrative right paratracheal nodal conglomerate. Additional pathologic lymph nodes in the high left prevascular mediastinum, left hilum and right supraclavicular region. 4. Findings are compatible with primary bronchogenic right lung carcinoma with bilateral hilar, bilateral mediastinal and right supraclavicular nodal metastases. PET-CT, tissue sampling and multidisciplinary thoracic oncology consultation are warranted. 5. Mild-to-moderate narrowing of the SVC, which remains patent. 6. Trace dependent right pleural effusion. 7. Three-vessel coronary atherosclerosis. 8. Ascending thoracic aortic 4.5 cm aneurysm. Ascending thoracic aortic aneurysm. Recommend semi-annual imaging followup by CTA or MRA and referral to cardiothoracic surgery if not already obtained. This  recommendation follows 2010 ACCF/AHA/AATS/ACR/ASA/SCA/SCAI/SIR/STS/SVM Guidelines for the Diagnosis and Management of Patients With Thoracic Aortic Disease. Circulation. 2010; 121: H545-G256. 9. Dilated main pulmonary artery, suggesting pulmonary arterial hypertension. Aortic Atherosclerosis (ICD10-I70.0). These results will be called to the ordering clinician or representative by the Radiologist Assistant, and communication documented in the PACS or zVision Dashboard. Electronically Signed   By: Ilona Sorrel M.D.   On: 11/14/2017 13:06    Labs:  CBC: Recent Labs    04/11/17 1209 11/07/17 1510 11/24/17 1126  WBC 5.6 16.0* 6.6  HGB 13.5 13.9 12.4*  HCT 39.4 41.4 38.5*  PLT 194.0 249.0 233    COAGS: Recent Labs    11/24/17 1126  INR 1.14    BMP: Recent Labs    04/11/17 1209 11/07/17 1510  NA 137 133*  K 4.3 4.3  CL 104 96  CO2 27 28  GLUCOSE 138* 156*  BUN 16 16  CALCIUM 9.2 9.7  CREATININE 0.81 0.89    LIVER FUNCTION TESTS: Recent Labs    04/11/17 1209  BILITOT 0.6   AST 14  ALT 10  ALKPHOS 52  PROT 7.2  ALBUMIN 4.0    TUMOR MARKERS: No results for input(s): AFPTM, CEA, CA199, CHROMGRNA in the last 8760 hours.  Assessment and Plan: 83 y.o. male, ex-smoker, with history of COPD, obstructive sleep apnea, reflux/PUD and recent imaging revealing: 1. Infiltrative right perihilar 8.5 cm lung mass with occluded right upper lobe bronchus and narrowed right mainstem bronchus. 2. Irregular solid 3.2 cm medial right upper lobe lung mass. 3. Bulky infiltrative right paratracheal nodal conglomerate. Additional pathologic lymph nodes in the high left prevascular mediastinum, left hilum and right supraclavicular region. 4. Findings are compatible with primary bronchogenic right lung carcinoma with bilateral hilar, bilateral mediastinal and right supraclavicular nodal metastases. PET-CT, tissue sampling and multidisciplinary thoracic oncology consultation are warranted. 5. Mild-to-moderate narrowing of the SVC, which remains patent. 6. Trace dependent right pleural effusion. 7. Three-vessel coronary atherosclerosis. 8. Ascending thoracic aortic 4.5 cm aneurysm. Ascending thoracic aortic aneurysm  He presents today for image guided right supraclavicular lymph node biopsy for further evaluation.No prior hx of cancer. Risks and benefits discussed with the patient/spouse including, but not limited to bleeding, infection, damage to adjacent structures or low yield requiring additional tests.  All of the patient's questions were answered, patient is agreeable to proceed. Consent signed and in chart.     Thank you for this interesting consult.  I greatly enjoyed meeting Scott Gomez and look forward to participating in their care.  A copy of this report was sent to the requesting provider on this date.  Electronically Signed: D. Rowe Robert, PA-C 11/24/2017, 12:58 PM   I spent a total of 25 minutes    in face to face in clinical consultation, greater  than 50% of which was counseling/coordinating care for image guided right supraclavicular lymph node biopsy

## 2017-11-24 NOTE — Telephone Encounter (Signed)
Call by IR Dr Anselm Pancoast  >> supraclavicular biopsy done US showed non occlusive thormbus of RIJ , dedicated vascular US of neck & RUE ordered Pl make appt to FU with [place] in 3 days to go over results & to decide whetehr anticoagulation required

## 2017-11-24 NOTE — Discharge Instructions (Signed)

## 2017-11-24 NOTE — Procedures (Signed)
  Pre-operative Diagnosis: Chest neoplasm with neck lymphadenopathy       Post-operative Diagnosis: Chest neoplasm with neck lymphadenopathy, Thrombus in right IJ   Indications: Needs tissue diagnosis  Procedure: US guided right neck lymph node biopsy   Findings: 6 cores.  Non-occlusive thrombus in right IJ.  Complications: None     EBL: Minimal  Plan: Bedrest 1 hour.  Will get Right neck and arm venous duplex.

## 2017-11-24 NOTE — Progress Notes (Addendum)
RUE venous duplex prelim: non occlusive DVT involving the right IJV is noted. No other DVT visualized in right arm. Left IJV is patent.   Landry Mellow, RDMS, RVT    RN aware of results.  Paged Dr. Anselm Pancoast at 3:16 PM   3:20 PM gave results to Dr. Anselm Pancoast

## 2017-11-24 NOTE — Discharge Instructions (Signed)
Moderate Conscious Sedation, Adult, Care After  These instructions provide you with information about caring for yourself after your procedure. Your health care provider may also give you more specific instructions. Your treatment has been planned according to current medical practices, but problems sometimes occur. Call your health care provider if you have any problems or questions after your procedure.  What can I expect after the procedure?  After your procedure, it is common:   To feel sleepy for several hours.   To feel clumsy and have poor balance for several hours.   To have poor judgment for several hours.   To vomit if you eat too soon.    Follow these instructions at home:  For at least 24 hours after the procedure:     Do not:  ? Participate in activities where you could fall or become injured.  ? Drive.  ? Use heavy machinery.  ? Drink alcohol.  ? Take sleeping pills or medicines that cause drowsiness.  ? Make important decisions or sign legal documents.  ? Take care of children on your own.   Rest.  Eating and drinking   Follow the diet recommended by your health care provider.   If you vomit:  ? Drink water, juice, or soup when you can drink without vomiting.  ? Make sure you have little or no nausea before eating solid foods.  General instructions   Have a responsible adult stay with you until you are awake and alert.   Take over-the-counter and prescription medicines only as told by your health care provider.   If you smoke, do not smoke without supervision.   Keep all follow-up visits as told by your health care provider. This is important.  Contact a health care provider if:   You keep feeling nauseous or you keep vomiting.   You feel light-headed.   You develop a rash.   You have a fever.  Get help right away if:   You have trouble breathing.  This information is not intended to replace advice given to you by your health care provider. Make sure you discuss any questions you have  with your health care provider.  Document Released: 10/21/2012 Document Revised: 06/05/2015 Document Reviewed: 04/22/2015  Elsevier Interactive Patient Education  2018 Elsevier Inc[employer]

## 2017-11-25 NOTE — Telephone Encounter (Signed)
Called patient twice and left a message for him to call back. So far he has not returned my calls. I see in his chart that he has an appt with Aaron Edelman on 12/08/17.

## 2017-11-26 ENCOUNTER — Telehealth: Payer: Self-pay | Admitting: *Deleted

## 2017-11-26 ENCOUNTER — Encounter (HOSPITAL_COMMUNITY): Payer: Self-pay

## 2017-11-26 ENCOUNTER — Encounter (HOSPITAL_COMMUNITY)
Admission: RE | Admit: 2017-11-26 | Discharge: 2017-11-26 | Disposition: A | Discharge status: Home / Self Care | Payer: Medicare Other | Source: Ambulatory Visit | Attending: Pulmonary Disease | Admitting: Pulmonary Disease

## 2017-11-26 DIAGNOSIS — R911 Solitary pulmonary nodule: Secondary | ICD-10-CM | POA: Insufficient documentation

## 2017-11-26 LAB — GLUCOSE, CAPILLARY: Glucose-Capillary: 136 mg/dL — ABNORMAL HIGH (ref 70–99)

## 2017-11-26 MED ORDER — FLUDEOXYGLUCOSE F - 18 (FDG) INJECTION
9.3000 | Freq: Once | INTRAVENOUS | Status: AC
  Administered 2017-11-26: 9.3 via INTRAVENOUS

## 2017-11-26 NOTE — Telephone Encounter (Signed)
I received call back updated on appt for [facility] 12/04/17

## 2017-11-26 NOTE — Telephone Encounter (Signed)
Spoke with patient's wife. He has been scheduled to see RA tomorrow at 1030am. Nothing further needed at time of call.

## 2017-11-26 NOTE — Telephone Encounter (Signed)
Oncology Nurse Navigator Documentation  Oncology Nurse Navigator Flowsheets 11/26/2017  Navigator Location [facility]  Referral date to RadOnc/MedOnc 11/26/2017  Navigator Encounter Type Telephone/I received referral on Scott Gomez today.  I updated Dr. Julien Nordmann. He states he can see him on 12/04/17.  I called patient to schedule. I was unable to reach but did leave a vm message fro him to call me with my name and phone number.   Telephone Outgoing Call  Treatment Phase Abnormal Scans  Barriers/Navigation Needs Coordination of Care  Interventions Coordination of Care  Coordination of Care Other  Acuity Level 2  Time Spent with Patient 30

## 2017-11-26 NOTE — Telephone Encounter (Signed)
Called patient. He advised for me to call back in a few minutes to speak with his wife to get him scheduled. I called patient again and left message for patient to call back.

## 2017-11-26 NOTE — Telephone Encounter (Signed)
Needs a sooner appointment with me/ BW to discuss biopsy results. Also needs appointment with Dr. Julien Nordmann ASAP

## 2017-11-27 ENCOUNTER — Ambulatory Visit: Payer: Medicare Other | Admitting: Pulmonary Disease

## 2017-11-27 ENCOUNTER — Encounter: Payer: Self-pay | Admitting: Pulmonary Disease

## 2017-11-27 DIAGNOSIS — J449 Chronic obstructive pulmonary disease, unspecified: Secondary | ICD-10-CM | POA: Diagnosis not present

## 2017-11-27 DIAGNOSIS — I82621 Acute embolism and thrombosis of deep veins of right upper extremity: Secondary | ICD-10-CM

## 2017-11-27 DIAGNOSIS — I82409 Acute embolism and thrombosis of unspecified deep veins of unspecified lower extremity: Secondary | ICD-10-CM | POA: Insufficient documentation

## 2017-11-27 DIAGNOSIS — C3490 Malignant neoplasm of unspecified part of unspecified bronchus or lung: Secondary | ICD-10-CM | POA: Diagnosis not present

## 2017-11-27 MED ORDER — APIXABAN 5 MG PO TABS: 5.0000 mg | ORAL_TABLET | Freq: Two times a day (BID) | ORAL | 0 refills | Status: DC

## 2017-11-27 NOTE — Progress Notes (Signed)
   Subjective:    Patient ID: Scott Gomez, male    DOB: 08-20-1934, 83 y.o.   MRN: [id number]  HPI 83 year old ex-smoker of my was following for OSA, presented with shortness of breath and "pneumonia" and was found to have a lung mass  Smoked 2 PPD until 1995, worked in a [employer] trader x 25 yrs.   CXR showed soft tissue fullness concern for lymphadenopathy along the right side of the mediastinum. CT Chest with contrast showed infiltrative right perihilar 8.5 cm lung mass with occluded right upper lobe bronchus and narrowed right mainstem bronchus. Irregular solid 3.2 cm medial right upper lobe lung mass.  CXR in Janurary 2019 showed clear lungs and normal mediastinum.  PET Large right central lung mass invading the mediastinum and right hilum demonstrates marked hypermetabolism consistent with neoplasm. 2. Separate 3.7 cm right apical lung mass is hypermetabolic and may be the primary tumor or a metastatic focus. 3. Right supraclavicular metastatic adenopathy    I have personally reviewed all imaging above. We proceeded with supraclavicular lymph node biopsy, which showed small cell cancer. This also picked up on right IJ thrombus probably extending into the SVC, no thrombus in subclavian  His main complaint is an occasional cough especially when he swallows  Significant tests/ events reviewed  PSG 07/2007 >> mod OSA , AHI 15.2/h with PLMs obliterated by CPAP 8, med FF mask  Spirometry 6/09>>FEV1 78% (surprisingly good), smaller airways affected 48%   Past Medical History:  Diagnosis Date  . Anxiety   . Arthritis    osteoarthritis both hands  . Carpal tunnel syndrome   . Diabetes mellitus    type II  . Fatigue   . Hypercholesteremia   . Hypopotassemia   . Leg cramps   . Nephrolithiasis    Hx of  . Palpitations    Hx of   . Shoulder pain, left   . Snoring   . Testosterone deficiency   . Ulcer    peptic ulcer disease    Past Surgical History:  Procedure  Laterality Date  . BACK SURGERY  1978   x2 disc     Review of Systems neg for any significant sore throat, dysphagia, itching, sneezing, nasal congestion or excess/ purulent secretions, fever, chills, sweats, unintended wt loss, pleuritic or exertional cp, hempoptysis, orthopnea pnd or change in chronic leg swelling. Also denies presyncope, palpitations, heartburn, abdominal pain, nausea, vomiting, diarrhea or change in bowel or urinary habits, dysuria,hematuria, rash, arthralgias, visual complaints, headache, numbness weakness or ataxia.     Objective:   Physical Exam  Gen. Pleasant, well-nourished, in no distress ENT - no thrush, no post nasal drip Neck: No JVD, no thyromegaly, no carotid bruits Lungs: no use of accessory muscles, no dullness to percussion, decreased rt without rales or rhonchi  Cardiovascular: Rhythm regular, heart sounds  normal, no murmurs or gallops, no peripheral edema Musculoskeletal: No deformities, no cyanosis or clubbing         Assessment & Plan:

## 2017-11-27 NOTE — Patient Instructions (Addendum)
We discussed diagnosis of advanced stage small cell lung cancer. Keep appointment at the cancer center.  Change appointment with [place] to 4 weeks.  Start taking blood thinner -Eliquis -10 mg twice daily for 7 days followed by 5 mg twice daily

## 2017-11-27 NOTE — Assessment & Plan Note (Addendum)
We discussed the diagnosis of advanced stage small cell cancer. We discussed options of chemotherapy and radiation.  He will likely continue with radiation due to involvement of right upper lobe bronchus.  PET scan was reviewed and discussed. We will refer him to cancer center with Dr. Julien Nordmann. He will likely need MRI of the brain for further staging.  Extensive discussion about prognosis and treatment options x 75m

## 2017-11-27 NOTE — Assessment & Plan Note (Signed)
Continue Spiriva but no symptoms at this time seem to be due to lung cancer

## 2017-11-27 NOTE — Assessment & Plan Note (Addendum)
He prefers tablets to injection.  I discussed that Lovenox would be the best treatment since he has active cancer but he still prefers tablets. We will start Eliquis given the suboptimal.If this is too expensive and he may need Coumadin Concern here is for impending SVC syndrome

## 2017-12-03 ENCOUNTER — Other Ambulatory Visit: Payer: Self-pay | Admitting: *Deleted

## 2017-12-03 DIAGNOSIS — C3490 Malignant neoplasm of unspecified part of unspecified bronchus or lung: Secondary | ICD-10-CM

## 2017-12-04 ENCOUNTER — Telehealth: Payer: Self-pay | Admitting: Internal Medicine

## 2017-12-04 ENCOUNTER — Ambulatory Visit
Admission: RE | Admit: 2017-12-04 | Discharge: 2017-12-04 | Disposition: A | Discharge status: Home / Self Care | Payer: Medicare Other | Source: Ambulatory Visit | Attending: Radiation Oncology | Admitting: Radiation Oncology

## 2017-12-04 ENCOUNTER — Encounter: Payer: Self-pay | Admitting: Medical Oncology

## 2017-12-04 ENCOUNTER — Inpatient Hospital Stay: Payer: Medicare Other | Attending: Internal Medicine | Admitting: Internal Medicine

## 2017-12-04 ENCOUNTER — Inpatient Hospital Stay: Payer: Medicare Other

## 2017-12-04 ENCOUNTER — Encounter: Payer: Self-pay | Admitting: *Deleted

## 2017-12-04 ENCOUNTER — Other Ambulatory Visit: Payer: Self-pay | Admitting: *Deleted

## 2017-12-04 ENCOUNTER — Encounter: Payer: Self-pay | Admitting: Internal Medicine

## 2017-12-04 VITALS — BP 124/74 | HR 110 | Temp 97.6°F | Resp 18 | Wt 199.9 lb

## 2017-12-04 VITALS — BP 124/74 | HR 110 | Temp 97.6°F | Resp 18 | Ht 68.75 in | Wt 199.9 lb

## 2017-12-04 DIAGNOSIS — R042 Hemoptysis: Secondary | ICD-10-CM

## 2017-12-04 DIAGNOSIS — C771 Secondary and unspecified malignant neoplasm of intrathoracic lymph nodes: Secondary | ICD-10-CM | POA: Diagnosis not present

## 2017-12-04 DIAGNOSIS — C770 Secondary and unspecified malignant neoplasm of lymph nodes of head, face and neck: Secondary | ICD-10-CM | POA: Diagnosis not present

## 2017-12-04 DIAGNOSIS — Z803 Family history of malignant neoplasm of breast: Secondary | ICD-10-CM | POA: Insufficient documentation

## 2017-12-04 DIAGNOSIS — C3411 Malignant neoplasm of upper lobe, right bronchus or lung: Secondary | ICD-10-CM

## 2017-12-04 DIAGNOSIS — Z8051 Family history of malignant neoplasm of kidney: Secondary | ICD-10-CM | POA: Insufficient documentation

## 2017-12-04 DIAGNOSIS — R05 Cough: Secondary | ICD-10-CM

## 2017-12-04 DIAGNOSIS — I10 Essential (primary) hypertension: Secondary | ICD-10-CM

## 2017-12-04 DIAGNOSIS — Z7189 Other specified counseling: Secondary | ICD-10-CM | POA: Insufficient documentation

## 2017-12-04 DIAGNOSIS — C3490 Malignant neoplasm of unspecified part of unspecified bronchus or lung: Secondary | ICD-10-CM

## 2017-12-04 DIAGNOSIS — Z5111 Encounter for antineoplastic chemotherapy: Secondary | ICD-10-CM | POA: Insufficient documentation

## 2017-12-04 DIAGNOSIS — Z87891 Personal history of nicotine dependence: Secondary | ICD-10-CM | POA: Insufficient documentation

## 2017-12-04 DIAGNOSIS — Z5189 Encounter for other specified aftercare: Secondary | ICD-10-CM | POA: Insufficient documentation

## 2017-12-04 DIAGNOSIS — J449 Chronic obstructive pulmonary disease, unspecified: Secondary | ICD-10-CM

## 2017-12-04 DIAGNOSIS — R0602 Shortness of breath: Secondary | ICD-10-CM

## 2017-12-04 DIAGNOSIS — R634 Abnormal weight loss: Secondary | ICD-10-CM

## 2017-12-04 LAB — CMP (CANCER CENTER ONLY)
ALBUMIN: 3.7 g/dL (ref 3.5–5.0)
ALK PHOS: 65 U/L (ref 38–126)
ALT: 11 U/L (ref 0–44)
AST: 16 U/L (ref 15–41)
Anion gap: 9 (ref 5–15)
BILIRUBIN TOTAL: 0.5 mg/dL (ref 0.3–1.2)
BUN: 8 mg/dL (ref 8–23)
CALCIUM: 9.3 mg/dL (ref 8.9–10.3)
CO2: 25 mmol/L (ref 22–32)
CREATININE: 0.95 mg/dL (ref 0.61–1.24)
Chloride: 104 mmol/L (ref 98–111)
GFR, Est AFR Am: >60 mL/min (ref >60)
GFR, Estimated: >60 mL/min (ref >60)
GLUCOSE: 108 mg/dL — AB (ref 70–99)
Potassium: 4.2 mmol/L (ref 3.5–5.1)
Sodium: 138 mmol/L (ref 135–145)
TOTAL PROTEIN: 6.8 g/dL (ref 6.5–8.1)

## 2017-12-04 LAB — CBC WITH DIFFERENTIAL (CANCER CENTER ONLY)
ABS IMMATURE GRANULOCYTES: 0.02 10*3/uL (ref 0.00–0.07)
BASOS PCT: 1 %
Basophils Absolute: 0 10*3/uL (ref 0.0–0.1)
EOS ABS: 0.1 10*3/uL (ref 0.0–0.5)
EOS PCT: 1 %
HCT: 39.4 % (ref 39.0–52.0)
Hemoglobin: 12.9 g/dL — ABNORMAL LOW (ref 13.0–17.0)
Immature Granulocytes: 0 %
Lymphocytes Relative: 20 %
Lymphs Abs: 1.3 10*3/uL (ref 0.7–4.0)
MCH: 28.4 pg (ref 26.0–34.0)
MCHC: 32.7 g/dL (ref 30.0–36.0)
MCV: 86.6 fL (ref 80.0–100.0)
MONO ABS: 0.6 10*3/uL (ref 0.1–1.0)
MONOS PCT: 9 %
Neutro Abs: 4.6 10*3/uL (ref 1.7–7.7)
Neutrophils Relative %: 69 %
PLATELETS: 228 10*3/uL (ref 150–400)
RBC: 4.55 MIL/uL (ref 4.22–5.81)
RDW: 13.5 % (ref 11.5–15.5)
WBC Count: 6.7 10*3/uL (ref 4.0–10.5)
nRBC: 0 % (ref 0.0–0.2)

## 2017-12-04 MED ORDER — PROCHLORPERAZINE MALEATE 10 MG PO TABS: 10.0000 mg | ORAL_TABLET | Freq: Four times a day (QID) | ORAL | 0 refills | Status: DC | PRN

## 2017-12-04 NOTE — Progress Notes (Signed)
Cancer conference discussion 12/04/17 documented for communication purpose. Patient was not seen or examine by physician.

## 2017-12-04 NOTE — Progress Notes (Signed)
[facility] Clinical Social Work  Clinical Social Work met with patient/family at [employer] appointment to offer support and assess for psychosocial needs.  Medical oncologist reviewed patient's diagnosis and recommended treatment plan with patient/family.  Patient was accompanied by his spouse (of 102 years) and daughter.  Mr. Delpilar shared he doesn't stay very active due to physical limitations and that has been difficult for him. Patient's spouse reported patient usually "takes it out on me" when he's feeling stressed.  CSW discussed strategies for communicating during a difficult time.  Clinical Social Work briefly discussed Clinical Social Work role and [employer] support programs/services.  Clinical Social Work encouraged patient to call with any additional questions or concerns.   Maryjean Morn, MSW, LCSW, OSW-C Clinical Social Worker [facility] [phone]

## 2017-12-04 NOTE — Telephone Encounter (Signed)
Printed calendar and avs. °

## 2017-12-04 NOTE — Progress Notes (Signed)
START ON PATHWAY REGIMEN - Small Cell Lung     A cycle is every 21 days:     Etoposide      Cisplatin   **Always confirm dose/schedule in your pharmacy ordering system**  Patient Characteristics: Newly Diagnosed, Preoperative or Nonsurgical Candidate (Clinical Staging), First Line, Limited Stage, Nonsurgical Candidate Therapeutic Status: Newly Diagnosed, Preoperative or Nonsurgical Candidate (Clinical Staging) AJCC T Category: cT4 AJCC N Category: cN3 AJCC M Category: cM0 AJCC 8 Stage Grouping: IIIC Stage Classification: Limited Surgical Candidacy: Nonsurgical Candidate Intent of Therapy: Curative Intent, Discussed with Patient

## 2017-12-04 NOTE — Progress Notes (Signed)
Radiation Oncology         [phone] ________________________________  Name: Scott Gomez        MRN: [id number]  Date of Service: 12/04/2017 DOB: 1934-06-09  CC:[name], [name], MD  Rigoberto Noel, MD     REFERRING PHYSICIAN: Rigoberto Noel, MD   DIAGNOSIS: The encounter diagnosis was Small cell lung cancer [facility]).   HISTORY OF PRESENT ILLNESS: Scott Gomez is a 83 y.o. male seen at the request of Dr. Julien Nordmann for a newly diagnosed small cell lung cancer. The patient had symptoms of chest pain and was evaluated by PCP earlier this year and diagnosed with bronchitis. He's had persistent cough and trouble breathing as well as edema of the neck and collateral vessels in the chest wall. He was evaluated and CXR on 11/07/17 revealed concerns for fullness in the right mediastinum and a CT chest on 11/14/17 revealed a right supraclavicular adenopathy, right paratracheal adenopathy and a conglomerate measuring 8.4 x 6.6 cm incasing the right brachiocephalic artery, right subclavian artery, right common carotid artery, in addition to subcarinal adenopathy, and a left hilar node. There was a right perihilar mass contiguous with the nodal conglomerate narrowing the right mainstem bronchus and occluding the right upper lobe bronchus. A 3.2 x 2.8 cm medial right upper lobe mass was also noted. He underwent ultrasound guided biopsy of the right supraclavicular nodes on 11/24/17 and also a doppler which revealed a right internal jugular thrombus. The biopsy revealed small cell cancer consistent with lung cancer. He was started on Eliquis. He also underwent PET scan on 11/26/17 that revealed hypermetabolic changes in the right hilum, regional nodes, left hilar node, and supraclavicular disease. No metastatic changes were noted. He comes today to discuss treatment recommendations for his cancer.    PREVIOUS RADIATION THERAPY: No   PAST MEDICAL HISTORY:  Past Medical History:  Diagnosis Date  .  Anxiety   . Arthritis    osteoarthritis both hands  . Carpal tunnel syndrome   . Diabetes mellitus    type II  . Fatigue   . Hypercholesteremia   . Hypopotassemia   . Leg cramps   . Nephrolithiasis    Hx of  . Palpitations    Hx of   . Shoulder pain, left   . Snoring   . Testosterone deficiency   . Ulcer    peptic ulcer disease       PAST SURGICAL HISTORY: Past Surgical History:  Procedure Laterality Date  . BACK SURGERY  1978   x2 disc     FAMILY HISTORY:  Family History  Problem Relation Age of Onset  . Cancer Brother        kidney  . Heart disease Brother        MI  . Hypertension Mother   . Allergies Mother   . Hypertension Father   . Allergies Father   . Cancer Sister   . Emphysema Sister   . Heart attack Sister      SOCIAL HISTORY:  reports that he quit smoking about 24 years ago. His smoking use included cigarettes. He has a 76.00 pack-year smoking history. He has never used smokeless tobacco. He reports that he does not drink alcohol or use drugs.   ALLERGIES: Buspirone hcl; Crestor [rosuvastatin calcium]; and Prednisone   MEDICATIONS:  Current Outpatient Medications  Medication Sig Dispense Refill  . albuterol (PROAIR HFA) 108 (90 Base) MCG/ACT inhaler Inhale 2 puffs into the lungs every 4 (four)  hours as needed for wheezing or shortness of breath. 1 Inhaler 2  . alendronate (FOSAMAX) 70 MG tablet Take 70 mg by mouth once a week. Take with a full glass of water on an empty stomach.    . ALPRAZolam (XANAX) 0.5 MG tablet TAKE ONE (1) TABLET BY MOUTH TWO (2) TIMES DAILY AS NEEDED FOR ANXIETY 60 tablet 3  . apixaban (ELIQUIS) 5 MG TABS tablet Take 1 tablet (5 mg total) by mouth 2 (two) times daily. 28 tablet 0  . Blood Glucose Monitoring Suppl (ONETOUCH VERIO IQ SYSTEM) w/Device KIT Use to test blood sugar once daily and as needed dx R73.9 1 kit 0  . cyclobenzaprine (FLEXERIL) 10 MG tablet Take 10 mg by mouth 3 (three) times daily as needed. For  muscle spasms     . docusate sodium (COLACE) 100 MG capsule Take 100 mg by mouth daily.    Marland Kitchen gabapentin (NEURONTIN) 100 MG capsule Take 200 mg by mouth 3 (three) times daily.     Marland Kitchen glucose blood (ONETOUCH VERIO) test strip USE TO CHECK BLOOD SUGAR ONCE DAILY 100 each 3  . levalbuterol (XOPENEX) 0.63 MG/3ML nebulizer solution Take 3 mLs (0.63 mg total) by nebulization every 4 (four) hours as needed for wheezing or shortness of breath. 3 mL 12  . lisinopril (PRINIVIL,ZESTRIL) 10 MG tablet Take 1 tablet (10 mg total) by mouth daily. 90 tablet 3  . magic mouthwash SOLN   0  . magic mouthwash w/lidocaine SOLN Take 5 mLs by mouth 3 (three) times daily as needed for mouth pain (swish and spit). 120 mL 0  . ONE TOUCH LANCETS MISC Use to test blood sugar once daily and as needed. Dx 790.29 200 each 1  . OVER THE COUNTER MEDICATION Take 1 tablet by mouth daily. Vitamin b 12    . Oxycodone HCl 10 MG TABS Take 1 tablet by mouth 2 (two) times daily as needed (pain).     . potassium chloride SA (K-DUR,KLOR-CON) 20 MEQ tablet TAKE ONE (1) TABLET BY MOUTH EVERY DAY 90 tablet 3  . prochlorperazine (COMPAZINE) 10 MG tablet Take 1 tablet (10 mg total) by mouth every 6 (six) hours as needed for nausea or vomiting. 30 tablet 0  . Tiotropium Bromide Monohydrate (SPIRIVA RESPIMAT) 2.5 MCG/ACT AERS Inhale 2 puffs into the lungs daily. 4 g 2  . travoprost, benzalkonium, (TRAVATAN) 0.004 % ophthalmic solution Place 1 drop into both eyes at bedtime.       No current facility-administered medications for this encounter.      REVIEW OF SYSTEMS: On review of systems, the patient reports that he is doing well overall. He has noticed a productive cough since January. In the last few weeks he has had episodes of hemoptysis that he describes as mild. He denies any chest pain,  fevers, chills, night sweats, unintended weight changes. He denies any bowel or bladder disturbances, and denies abdominal pain, nausea or vomiting. He  denies any new musculoskeletal or joint aches or pains. He does see a pain clinic for chronic back pain. A complete review of systems is obtained and is otherwise negative.     PHYSICAL EXAM:  Wt Readings from Last 3 Encounters:  12/04/17 199 lb 14.4 oz (90.7 kg)  12/04/17 199 lb 14.4 oz (90.7 kg)  11/27/17 199 lb 3.2 oz (90.4 kg)   Temp Readings from Last 3 Encounters:  12/04/17 97.6 F (36.4 C) (Oral)  12/04/17 97.6 F (36.4 C)  11/24/17 (!) 97.3  F (36.3 C)   BP Readings from Last 3 Encounters:  12/04/17 124/74  12/04/17 124/74  11/27/17 116/74   Pulse Readings from Last 3 Encounters:  12/04/17 (!) 110  12/04/17 (!) 110  11/27/17 97     In general this is a chronically ill caucasian male in no acute distress. He is alert and oriented x4 and appropriate throughout the examination. HEENT reveals that the patient is normocephalic, atraumatic. EOMs are intact. PERRLA. Skin is intact without any evidence of gross lesions. Cardiovascular exam reveals a regular rate and rhythm, no clicks rubs or murmurs are auscultated. Chest is clear to auscultation bilaterally. Lymphatic assessment is performed and does not reveal any adenopathy in the cervical chains but reveals palpable fullness in the right supraclavicular nodes. Abdomen has active bowel sounds in all quadrants and is intact. The abdomen is soft, non tender, non distended. Lower extremities are negative for pretibial pitting edema, deep calf tenderness, cyanosis or clubbing.   ECOG = 0  0 - Asymptomatic (Fully active, able to carry on all predisease activities without restriction)  1 - Symptomatic but completely ambulatory (Restricted in physically strenuous activity but ambulatory and able to carry out work of a light or sedentary nature. For example, light housework, office work)  2 - Symptomatic, <50% in bed during the day (Ambulatory and capable of all self care but unable to carry out any work activities. Up and about more  than 50% of waking hours)  3 - Symptomatic, >50% in bed, but not bedbound (Capable of only limited self-care, confined to bed or chair 50% or more of waking hours)  4 - Bedbound (Completely disabled. Cannot carry on any self-care. Totally confined to bed or chair)  5 - Death   Eustace Pen MM, Creech RH, Tormey DC, et al. [phone]). "Toxicity and response criteria of the  [facility]". [place] Oncol. 5 (6): 649-55    LABORATORY DATA:  Lab Results  Component Value Date   WBC 6.7 12/04/2017   HGB 12.9 (L) 12/04/2017   HCT 39.4 12/04/2017   MCV 86.6 12/04/2017   PLT 228 12/04/2017   Lab Results  Component Value Date   NA 138 12/04/2017   K 4.2 12/04/2017   CL 104 12/04/2017   CO2 25 12/04/2017   Lab Results  Component Value Date   ALT 11 12/04/2017   AST 16 12/04/2017   ALKPHOS 65 12/04/2017   BILITOT 0.5 12/04/2017      RADIOGRAPHY: Dg Chest 2 View  Result Date: 11/07/2017 CLINICAL DATA:  83 year old with mid sternal chest pain and cough. Shortness of breath. EXAM: CHEST - 2 VIEW COMPARISON:  Chest radiograph 01/28/2017 FINDINGS: Increased soft tissue densities in the right paratracheal and right side of the mediastinum. In addition, there appears to be soft tissue fullness in the right hilar region on the lateral view. Tracheal deviation towards the left which is new. Left lung is clear. Right lung base is clear. Heart size is normal. Atherosclerotic calcifications at the aortic arch. IMPRESSION: Soft tissue fullness and concern for lymphadenopathy along the right side of the mediastinum. This represents a change from the previous examination. Recommend a chest CT with IV contrast to evaluate for a neoplastic process. These results will be called to the ordering clinician or representative by the Radiologist Assistant, and communication documented in the PACS or zVision Dashboard. Electronically Signed   By: Markus Daft M.D.   On: 11/07/2017 15:31   Ct Chest W  Contrast  Result Date: 11/14/2017 CLINICAL DATA:  Dyspnea.  Abnormal chest radiograph. EXAM: CT CHEST WITH CONTRAST TECHNIQUE: Multidetector CT imaging of the chest was performed during intravenous contrast administration. CONTRAST:  63m ISOVUE-300 IOPAMIDOL (ISOVUE-300) INJECTION 61% COMPARISON:  11/07/2017 chest radiograph. FINDINGS: Cardiovascular: Normal heart size. No significant pericardial effusion/thickening. Three-vessel coronary atherosclerosis. Atherosclerotic thoracic aorta with 4.5 cm ascending thoracic aortic aneurysm. Dilated main pulmonary artery (3.5 cm diameter). Narrowing of the right pulmonary artery and occlusion of the right upper lobe pulmonary artery branch. Mild-to-moderate narrowing of the SVC, which remains patent. No central pulmonary emboli. Mediastinum/Nodes: Hypodense 1.0 cm posterior right thyroid lobe nodule. Unremarkable esophagus. No axillary adenopathy. Multiple enlarged right supraclavicular nodes, largest 1.8 cm (series 2/image 3). Infiltrative right paratracheal nodal conglomerate measuring 8.4 x 6.6 cm (series 2/image 37), in casing the right brachiocephalic artery, right subclavian artery proximally and right common carotid artery proximally. Enlarged 1.5 cm high left mediastinal prevascular node (series 2/image 28). Enlarged 1.7 cm subcarinal node (series 2/image 79). Enlarged 1.0 cm left hilar node (series 2/image 76). Lungs/Pleura: No pneumothorax. Trace dependent right pleural effusion. No left pleural effusion. Infiltrative right perihilar 8.5 x 6.5 cm lung mass (series 2/image 58), which is contiguous with infiltrative bulky right paratracheal nodal conglomerate. Narrowing of right mainstem bronchus and occlusion of the right upper lobe bronchus by the mass. Irregular solid 3.2 x 2.8 cm medial right upper lobe lung mass (series 3/image 33). Calcified 4 mm posterior left upper lobe granuloma. No additional significant pulmonary nodules. Upper abdomen: No acute  abnormality. Musculoskeletal: No aggressive appearing focal osseous lesions. Marked thoracic spondylosis. Moderate L1 vertebral compression fracture, chronic and stable since 08/06/2017 lumbar spine MRI. IMPRESSION: 1. Infiltrative right perihilar 8.5 cm lung mass with occluded right upper lobe bronchus and narrowed right mainstem bronchus. 2. Irregular solid 3.2 cm medial right upper lobe lung mass. 3. Bulky infiltrative right paratracheal nodal conglomerate. Additional pathologic lymph nodes in the high left prevascular mediastinum, left hilum and right supraclavicular region. 4. Findings are compatible with primary bronchogenic right lung carcinoma with bilateral hilar, bilateral mediastinal and right supraclavicular nodal metastases. PET-CT, tissue sampling and multidisciplinary thoracic oncology consultation are warranted. 5. Mild-to-moderate narrowing of the SVC, which remains patent. 6. Trace dependent right pleural effusion. 7. Three-vessel coronary atherosclerosis. 8. Ascending thoracic aortic 4.5 cm aneurysm. Ascending thoracic aortic aneurysm. Recommend semi-annual imaging followup by CTA or MRA and referral to cardiothoracic surgery if not already obtained. This recommendation follows 2010 ACCF/AHA/AATS/ACR/ASA/SCA/SCAI/SIR/STS/SVM Guidelines for the Diagnosis and Management of Patients With Thoracic Aortic Disease. Circulation. 2010; 121: [id number] 9. Dilated main pulmonary artery, suggesting pulmonary arterial hypertension. Aortic Atherosclerosis (ICD10-I70.0). These results will be called to the ordering clinician or representative by the Radiologist Assistant, and communication documented in the PACS or zVision Dashboard. Electronically Signed   By: JIlona SorrelM.D.   On: 11/14/2017 13:06   Nm Pet Image Initial (pi) Skull Base To Thigh  Result Date: 11/26/2017 CLINICAL DATA:  Initial treatment strategy for lung mass. EXAM: NUCLEAR MEDICINE PET SKULL BASE TO THIGH TECHNIQUE: 9.3 mCi F-18 FDG  was injected intravenously. Full-ring PET imaging was performed from the skull base to thigh after the radiotracer. CT data was obtained and used for attenuation correction and anatomic localization. Fasting blood glucose: 136 mg/dl COMPARISON:  Chest CT 11/14/2017 FINDINGS: Mediastinal blood pool activity: SUV max 2.62 NECK: Right supraclavicular nodal mass, recently biopsied measures a maximum of 3.7 cm on image number 45 and the SUV max is 9.99 No upper  cervical adenopathy or left-sided supraclavicular adenopathy. No axillary adenopathy. Incidental CT findings: none CHEST: Large central right lung mass invading the hilum and mediastinum is markedly hypermetabolic with SUV max of 16.10. Separate 3 cm right upper lobe lung lesion has an SUV max of 14.15. Contralateral prevascular lymph node measures 12 mm and the SUV max is 6.22. No other pulmonary nodules are identified. Incidental CT findings: Advanced atherosclerotic calcifications involving the aorta and coronary arteries. ABDOMEN/PELVIS: No abnormal hypermetabolic activity within the liver, pancreas, adrenal glands, or spleen. No hypermetabolic lymph nodes in the abdomen or pelvis. Incidental CT findings: Advanced atherosclerotic calcifications involving the aorta and iliac arteries. No aneurysm. SKELETON: No focal hypermetabolic activity to suggest skeletal metastasis. Incidental CT findings: none IMPRESSION: 1. Large right central lung mass invading the mediastinum and right hilum demonstrates marked hypermetabolism consistent with neoplasm. 2. Separate 3.7 cm right apical lung mass is hypermetabolic and may be the primary tumor or a metastatic focus. 3. Right supraclavicular metastatic adenopathy as recently biopsied. 4. No PET-CT findings to suggest abdominal/pelvic metastatic disease or osseous metastatic disease. Electronically Signed   By: Marijo Sanes M.D.   On: 11/26/2017 16:59   Vas [place] Upper Extremity Venous Duplex  Result Date:  11/24/2017 UPPER VENOUS STUDY  Indications: IJ clot noted during neck biopsy for lymphadenopathy Performing Technologist: Landry Mellow RDMS, RVT  Examination Guidelines: A complete evaluation includes B-mode imaging, spectral Doppler, color Doppler, and power Doppler as needed of all accessible portions of each vessel. Bilateral testing is considered an integral part of a complete examination. Limited examinations for reoccurring indications may be performed as noted.  Right Findings: +----------+------------+----------+---------+-----------+-------+ RIGHT     CompressiblePropertiesPhasicitySpontaneousSummary +----------+------------+----------+---------+-----------+-------+ IJV           None                 Yes       Yes     Acute  +----------+------------+----------+---------+-----------+-------+ Subclavian    Full                 Yes       Yes            +----------+------------+----------+---------+-----------+-------+ Axillary      Full                 Yes       Yes            +----------+------------+----------+---------+-----------+-------+ Brachial      Full                 Yes       Yes            +----------+------------+----------+---------+-----------+-------+ Radial        Full                                          +----------+------------+----------+---------+-----------+-------+ Ulnar         Full                                          +----------+------------+----------+---------+-----------+-------+ Cephalic      Full                                          +----------+------------+----------+---------+-----------+-------+  Basilic       Full                                          +----------+------------+----------+---------+-----------+-------+  Left Findings: +----------+------------+----------+---------+-----------+-------+ LEFT      CompressiblePropertiesPhasicitySpontaneousSummary  +----------+------------+----------+---------+-----------+-------+ IJV           Full                 Yes       Yes            +----------+------------+----------+---------+-----------+-------+ Subclavian                         Yes       Yes            +----------+------------+----------+---------+-----------+-------+  Summary:  Right: Findings consistent with acute deep vein thrombosis involving the right internal jugular vein.  Left: No evidence of thrombosis in the subclavian.  *See table(s) above for measurements and observations.  Diagnosing physician: Ruta Hinds MD Electronically signed by Ruta Hinds MD on 11/24/2017 at 4:22:37 PM.    Final        IMPRESSION/PLAN: 1. Limited Stage Small Cell Carcinoma of the right lung with regional adenopathy in the right hilum, left mediastinum, and right supraclavicular site. Dr. Lisbeth Renshaw discusses the pathology findings and reviews the nature of small cell lung cancer. Despite his adenopathy, he is still felt to be limited stage and would benefit from chemoradiation. Dr. Julien Nordmann would like to start chemotherapy next week. We will plan to begin the concurrent aspect of the regimen with the second cycle of chemotherapy. We discussed the risks, benefits, short, and long term effects of radiotherapy, and the patient is interested in proceeding. Dr. Lisbeth Renshaw discusses the delivery and logistics of radiotherapy and anticipates a course of 6 1/2 weeks of radiotherapy to the right lung/hilum and regional nodes. Written consent is obtained and placed in the chart, a copy was provided to the patient. We will coordinate simulation in the next few weeks.  2. PCI. We did discuss the rationale for an MRI as a part of his staging. We also discussed the rationale to consider prophylactic cranial irradiation. He is in agreement with this plan and we will review this further at the appropriate interval.   The above documentation reflects my direct findings during  this shared patient visit. Please see the separate note by Dr. Lisbeth Renshaw on this date for the remainder of the patient's plan of care.    Carola Rhine, PAC

## 2017-12-04 NOTE — Progress Notes (Unsigned)
Research Consent Encounter for LU005 Dr. Julien Nordmann referred patient to study today during [place]. I met with patient, spouse and daughter briefly at the end of their clinic visit today. They confirm that Dr. Julien Nordmann gave them a brief overview of study. I provided patient a brief overview of study as well and discussed with them the randomization process and the the two groups, one of which, the patient may be randomized to should patient be eligible for study and interested in participation. Patient was also informed of the study drug that is part of this study. Patient and spouse denied having any questions at this time. We made plans for me to contact them Monday, after they had some time to review the information I provided them today. I informed patient that I will check the study eligibility and call him should I have any new information for him. Patient was provided with study consent form, a Clinical trials information pamphlet and my contact information. Patient and spouse thanked for their time and consideration of study and were encouraged to call me with questions.  Maxwell Marion, RN, BSN, [facility] Clinical Research 12/04/2017 4:19 PM

## 2017-12-04 NOTE — Progress Notes (Signed)
[facility] [phone]   [phone]  CONSULT NOTE  REFERRING PHYSICIAN: Dr. Kara Mead  REASON FOR CONSULTATION:  83 years old white male recently diagnosed with lung cancer.  HPI Scott Gomez is a 83 y.o. male with past medical history significant for borderline diabetes mellitus, dyslipidemia, osteoarthritis, and anxiety, history of kidney stone, history of back surgery as well as long history for smoking but quit 30 years ago.  The patient mentioned that he has been complaining of cough productive of thick whitish sputum as well as shortness of breath and midsternal chest pain for few weeks.  He was seen by his primary care physician and treated with a course of antibiotics with no improvement in his condition.  Chest x-ray was performed on November 07, 2017 and that showed soft tissue fullness and concern for lymphadenopathy along the right side of the mediastinum.  This was followed by CT scan of the chest on November 14, 2017 and that showed infiltrative right perihilar 8.5 cm lung mass with occluded right upper lobe bronchus and narrowed right mainstem bronchus.  There was irregular solid 3.2 cm medial right upper lobe lung mass in addition to bulky infiltrative right paratracheal nodal conglomerate.  There was additional pathologic lymph nodes and the high left prevascular mediastinum, left hilum and right supraclavicular region.  These findings were compatible with primary bronchogenic right lung carcinoma with bilateral hilar, bilateral mediastinal and right supraclavicular nodal metastasis.  The patient was seen by [name] and he will order ultrasound-guided core biopsy of the right supraclavicular lymph node which was done by interventional radiology on November 24, 2017 and the final pathology (SZB [phone]) was consistent with metastatic small cell carcinoma.  The cells were positive for CD 56, chromogranin, synaptophysin, pancytokeratin and  TTF-1. The patient also had a PET scan on November 26, 2017 and that showed large right central lung mass invading the mediastinum and right hilum with market hyper metabolism consistent with neoplasm.  There was a separate 3.7 cm right paratracheal mass that is hypermetabolic and may be the primary tumor or a metastatic focus.  There was also hypermetabolic right supraclavicular metastatic adenopathy.  There was no PET-CT findings to suggest abdominal/pelvic metastasis or osseous metastatic disease. [name] kindly referred the patient to me today for evaluation and recommendation regarding treatment of his condition.  When seen today he continues to complain of cough as well as choking sensation with solid food.  He has intermittent hemoptysis as well as shortness of breath but no significant chest pain.  He denied having any nausea, vomiting, diarrhea or constipation.  He denied having any headache or visual changes.  He lost around 20 pounds in the last few weeks. Family history significant for brother with kidney cancer, mother had hypertension, sister had breast cancer.  Father had hypertension. The patient is married and had 3 children.  2 of his sons are deceased and he has 51 living daughter.  He was accompanied by his wife Mardene Celeste and his daughter Lelan Pons.  He is to work in [employer].  He has a history for smoking up to 2 pack/day for around 35 years and quit 30 years ago.  He has no history of alcohol or drug abuse.  HPI  Past Medical History:  Diagnosis Date  . Anxiety   . Arthritis    osteoarthritis both hands  . Carpal tunnel syndrome   . Diabetes mellitus    type II  .  Fatigue   . Hypercholesteremia   . Hypopotassemia   . Leg cramps   . Nephrolithiasis    Hx of  . Palpitations    Hx of   . Shoulder pain, left   . Snoring   . Testosterone deficiency   . Ulcer    peptic ulcer disease    Past Surgical History:  Procedure Laterality Date  . BACK SURGERY  1978   x2 disc     Family History  Problem Relation Age of Onset  . Cancer Brother        kidney  . Heart disease Brother        MI  . Hypertension Mother   . Allergies Mother   . Hypertension Father   . Allergies Father   . Cancer Sister   . Emphysema Sister   . Heart attack Sister     Social History Social History   Tobacco Use  . Smoking status: Former Smoker    Packs/day: 2.00    Years: 38.00    Pack years: 76.00    Types: Cigarettes    Last attempt to quit: 01/14/1993    Years since quitting: 24.9  . Smokeless tobacco: Never Used  Substance Use Topics  . Alcohol use: No    Alcohol/week: 0.0 standard drinks  . Drug use: No    Allergies  Allergen Reactions  . Buspirone Hcl     REACTION: itching  . Crestor [Rosuvastatin Calcium]     Increased his blood sugar   . Prednisone     REACTION: sick, per wife he can take this.    Current Outpatient Medications  Medication Sig Dispense Refill  . albuterol (PROAIR HFA) 108 (90 Base) MCG/ACT inhaler Inhale 2 puffs into the lungs every 4 (four) hours as needed for wheezing or shortness of breath. 1 Inhaler 2  . alendronate (FOSAMAX) 70 MG tablet Take 70 mg by mouth once a week. Take with a full glass of water on an empty stomach.    . ALPRAZolam (XANAX) 0.5 MG tablet TAKE ONE (1) TABLET BY MOUTH TWO (2) TIMES DAILY AS NEEDED FOR ANXIETY 60 tablet 3  . apixaban (ELIQUIS) 5 MG TABS tablet Take 1 tablet (5 mg total) by mouth 2 (two) times daily. 28 tablet 0  . Blood Glucose Monitoring Suppl (ONETOUCH VERIO IQ SYSTEM) w/Device KIT Use to test blood sugar once daily and as needed dx R73.9 1 kit 0  . cyclobenzaprine (FLEXERIL) 10 MG tablet Take 10 mg by mouth 3 (three) times daily as needed. For muscle spasms     . docusate sodium (COLACE) 100 MG capsule Take 100 mg by mouth daily.    Marland Kitchen gabapentin (NEURONTIN) 100 MG capsule Take 200 mg by mouth 3 (three) times daily.     Marland Kitchen glucose blood (ONETOUCH VERIO) test strip USE TO CHECK BLOOD SUGAR ONCE  DAILY 100 each 3  . levalbuterol (XOPENEX) 0.63 MG/3ML nebulizer solution Take 3 mLs (0.63 mg total) by nebulization every 4 (four) hours as needed for wheezing or shortness of breath. 3 mL 12  . lisinopril (PRINIVIL,ZESTRIL) 10 MG tablet Take 1 tablet (10 mg total) by mouth daily. 90 tablet 3  . magic mouthwash w/lidocaine SOLN Take 5 mLs by mouth 3 (three) times daily as needed for mouth pain (swish and spit). 120 mL 0  . ONE TOUCH LANCETS MISC Use to test blood sugar once daily and as needed. Dx 790.29 200 each 1  . Oxycodone HCl 10 MG  TABS Take 1 tablet by mouth 2 (two) times daily as needed (pain).     . potassium chloride SA (K-DUR,KLOR-CON) 20 MEQ tablet TAKE ONE (1) TABLET BY MOUTH EVERY DAY 90 tablet 3  . Tiotropium Bromide Monohydrate (SPIRIVA RESPIMAT) 2.5 MCG/ACT AERS Inhale 2 puffs into the lungs daily. 4 g 2  . travoprost, benzalkonium, (TRAVATAN) 0.004 % ophthalmic solution Place 1 drop into both eyes at bedtime.       No current facility-administered medications for this visit.     Review of Systems  Constitutional: positive for fatigue and weight loss Eyes: negative Ears, nose, mouth, throat, and face: negative Respiratory: positive for cough, dyspnea on exertion and hemoptysis Cardiovascular: negative Gastrointestinal: negative Genitourinary:negative Integument/breast: negative Hematologic/lymphatic: negative Musculoskeletal:negative Neurological: negative Behavioral/Psych: negative Endocrine: negative Allergic/Immunologic: negative  Physical Exam  GEX:BMWUX, healthy, no distress, well nourished, well developed and anxious SKIN: skin color, texture, turgor are normal, no rashes or significant lesions HEAD: Normocephalic, No masses, lesions, tenderness or abnormalities EYES: normal, PERRLA, Conjunctiva are pink and non-injected EARS: External ears normal, Canals clear OROPHARYNX:no exudate, no erythema and lips, buccal mucosa, and tongue normal  NECK: Palpable  right supraclavicular lymph node measuring around 2 cm in size. LYMPH:  enlarged lymph nodes palpated in the right supraclavicular area. LUNGS: prolonged expiratory phase, expiratory wheezes bilaterally HEART: regular rate & rhythm, no murmurs and no gallops ABDOMEN:abdomen soft, non-tender, normal bowel sounds and no masses or organomegaly BACK: No CVA tenderness, Range of motion is normal EXTREMITIES:no joint deformities, effusion, or inflammation, no edema  NEURO: alert & oriented x 3 with fluent speech, no focal motor/sensory deficits  PERFORMANCE STATUS: ECOG 1  LABORATORY DATA: Lab Results  Component Value Date   WBC 6.7 12/04/2017   HGB 12.9 (L) 12/04/2017   HCT 39.4 12/04/2017   MCV 86.6 12/04/2017   PLT 228 12/04/2017      Chemistry      Component Value Date/Time   NA 133 (L) 11/07/2017 1510   K 4.3 11/07/2017 1510   CL 96 11/07/2017 1510   CO2 28 11/07/2017 1510   BUN 16 11/07/2017 1510   CREATININE 0.89 11/07/2017 1510      Component Value Date/Time   CALCIUM 9.7 11/07/2017 1510   ALKPHOS 52 04/11/2017 1209   AST 14 04/11/2017 1209   ALT 10 04/11/2017 1209   BILITOT 0.6 04/11/2017 1209       RADIOGRAPHIC STUDIES: Dg Chest 2 View  Result Date: 11/07/2017 CLINICAL DATA:  83 year old with mid sternal chest pain and cough. Shortness of breath. EXAM: CHEST - 2 VIEW COMPARISON:  Chest radiograph 01/28/2017 FINDINGS: Increased soft tissue densities in the right paratracheal and right side of the mediastinum. In addition, there appears to be soft tissue fullness in the right hilar region on the lateral view. Tracheal deviation towards the left which is new. Left lung is clear. Right lung base is clear. Heart size is normal. Atherosclerotic calcifications at the aortic arch. IMPRESSION: Soft tissue fullness and concern for lymphadenopathy along the right side of the mediastinum. This represents a change from the previous examination. Recommend a chest CT with IV  contrast to evaluate for a neoplastic process. These results will be called to the ordering clinician or representative by the Radiologist Assistant, and communication documented in the PACS or zVision Dashboard. Electronically Signed   By: Markus Daft M.D.   On: 11/07/2017 15:31   Ct Chest W Contrast  Result Date: 11/14/2017 CLINICAL DATA:  Dyspnea.  Abnormal  chest radiograph. EXAM: CT CHEST WITH CONTRAST TECHNIQUE: Multidetector CT imaging of the chest was performed during intravenous contrast administration. CONTRAST:  3m ISOVUE-300 IOPAMIDOL (ISOVUE-300) INJECTION 61% COMPARISON:  11/07/2017 chest radiograph. FINDINGS: Cardiovascular: Normal heart size. No significant pericardial effusion/thickening. Three-vessel coronary atherosclerosis. Atherosclerotic thoracic aorta with 4.5 cm ascending thoracic aortic aneurysm. Dilated main pulmonary artery (3.5 cm diameter). Narrowing of the right pulmonary artery and occlusion of the right upper lobe pulmonary artery branch. Mild-to-moderate narrowing of the SVC, which remains patent. No central pulmonary emboli. Mediastinum/Nodes: Hypodense 1.0 cm posterior right thyroid lobe nodule. Unremarkable esophagus. No axillary adenopathy. Multiple enlarged right supraclavicular nodes, largest 1.8 cm (series 2/image 3). Infiltrative right paratracheal nodal conglomerate measuring 8.4 x 6.6 cm (series 2/image 37), in casing the right brachiocephalic artery, right subclavian artery proximally and right common carotid artery proximally. Enlarged 1.5 cm high left mediastinal prevascular node (series 2/image 28). Enlarged 1.7 cm subcarinal node (series 2/image 79). Enlarged 1.0 cm left hilar node (series 2/image 76). Lungs/Pleura: No pneumothorax. Trace dependent right pleural effusion. No left pleural effusion. Infiltrative right perihilar 8.5 x 6.5 cm lung mass (series 2/image 58), which is contiguous with infiltrative bulky right paratracheal nodal conglomerate. Narrowing of  right mainstem bronchus and occlusion of the right upper lobe bronchus by the mass. Irregular solid 3.2 x 2.8 cm medial right upper lobe lung mass (series 3/image 33). Calcified 4 mm posterior left upper lobe granuloma. No additional significant pulmonary nodules. Upper abdomen: No acute abnormality. Musculoskeletal: No aggressive appearing focal osseous lesions. Marked thoracic spondylosis. Moderate L1 vertebral compression fracture, chronic and stable since 08/06/2017 lumbar spine MRI. IMPRESSION: 1. Infiltrative right perihilar 8.5 cm lung mass with occluded right upper lobe bronchus and narrowed right mainstem bronchus. 2. Irregular solid 3.2 cm medial right upper lobe lung mass. 3. Bulky infiltrative right paratracheal nodal conglomerate. Additional pathologic lymph nodes in the high left prevascular mediastinum, left hilum and right supraclavicular region. 4. Findings are compatible with primary bronchogenic right lung carcinoma with bilateral hilar, bilateral mediastinal and right supraclavicular nodal metastases. PET-CT, tissue sampling and multidisciplinary thoracic oncology consultation are warranted. 5. Mild-to-moderate narrowing of the SVC, which remains patent. 6. Trace dependent right pleural effusion. 7. Three-vessel coronary atherosclerosis. 8. Ascending thoracic aortic 4.5 cm aneurysm. Ascending thoracic aortic aneurysm. Recommend semi-annual imaging followup by CTA or MRA and referral to cardiothoracic surgery if not already obtained. This recommendation follows 2010 ACCF/AHA/AATS/ACR/ASA/SCA/SCAI/SIR/STS/SVM Guidelines for the Diagnosis and Management of Patients With Thoracic Aortic Disease. Circulation. 2010; 121: eY706-C376 9. Dilated main pulmonary artery, suggesting pulmonary arterial hypertension. Aortic Atherosclerosis (ICD10-I70.0). These results will be called to the ordering clinician or representative by the Radiologist Assistant, and communication documented in the PACS or zVision  Dashboard. Electronically Signed   By: JIlona SorrelM.D.   On: 11/14/2017 13:06   Nm Pet Image Initial (pi) Skull Base To Thigh  Result Date: 11/26/2017 CLINICAL DATA:  Initial treatment strategy for lung mass. EXAM: NUCLEAR MEDICINE PET SKULL BASE TO THIGH TECHNIQUE: 9.3 mCi F-18 FDG was injected intravenously. Full-ring PET imaging was performed from the skull base to thigh after the radiotracer. CT data was obtained and used for attenuation correction and anatomic localization. Fasting blood glucose: 136 mg/dl COMPARISON:  Chest CT 11/14/2017 FINDINGS: Mediastinal blood pool activity: SUV max 2.62 NECK: Right supraclavicular nodal mass, recently biopsied measures a maximum of 3.7 cm on image number 45 and the SUV max is 9.99 No upper cervical adenopathy or left-sided supraclavicular adenopathy. No axillary adenopathy.  Incidental CT findings: none CHEST: Large central right lung mass invading the hilum and mediastinum is markedly hypermetabolic with SUV max of 25.95. Separate 3 cm right upper lobe lung lesion has an SUV max of 14.15. Contralateral prevascular lymph node measures 12 mm and the SUV max is 6.22. No other pulmonary nodules are identified. Incidental CT findings: Advanced atherosclerotic calcifications involving the aorta and coronary arteries. ABDOMEN/PELVIS: No abnormal hypermetabolic activity within the liver, pancreas, adrenal glands, or spleen. No hypermetabolic lymph nodes in the abdomen or pelvis. Incidental CT findings: Advanced atherosclerotic calcifications involving the aorta and iliac arteries. No aneurysm. SKELETON: No focal hypermetabolic activity to suggest skeletal metastasis. Incidental CT findings: none IMPRESSION: 1. Large right central lung mass invading the mediastinum and right hilum demonstrates marked hypermetabolism consistent with neoplasm. 2. Separate 3.7 cm right apical lung mass is hypermetabolic and may be the primary tumor or a metastatic focus. 3. Right  supraclavicular metastatic adenopathy as recently biopsied. 4. No PET-CT findings to suggest abdominal/pelvic metastatic disease or osseous metastatic disease. Electronically Signed   By: Marijo Sanes M.D.   On: 11/26/2017 16:59   Vas [place] Upper Extremity Venous Duplex  Result Date: 11/24/2017 UPPER VENOUS STUDY  Indications: IJ clot noted during neck biopsy for lymphadenopathy Performing Technologist: Landry Mellow RDMS, RVT  Examination Guidelines: A complete evaluation includes B-mode imaging, spectral Doppler, color Doppler, and power Doppler as needed of all accessible portions of each vessel. Bilateral testing is considered an integral part of a complete examination. Limited examinations for reoccurring indications may be performed as noted.  Right Findings: +----------+------------+----------+---------+-----------+-------+ RIGHT     CompressiblePropertiesPhasicitySpontaneousSummary +----------+------------+----------+---------+-----------+-------+ IJV           None                 Yes       Yes     Acute  +----------+------------+----------+---------+-----------+-------+ Subclavian    Full                 Yes       Yes            +----------+------------+----------+---------+-----------+-------+ Axillary      Full                 Yes       Yes            +----------+------------+----------+---------+-----------+-------+ Brachial      Full                 Yes       Yes            +----------+------------+----------+---------+-----------+-------+ Radial        Full                                          +----------+------------+----------+---------+-----------+-------+ Ulnar         Full                                          +----------+------------+----------+---------+-----------+-------+ Cephalic      Full                                          +----------+------------+----------+---------+-----------+-------+  Basilic       Full                                           +----------+------------+----------+---------+-----------+-------+  Left Findings: +----------+------------+----------+---------+-----------+-------+ LEFT      CompressiblePropertiesPhasicitySpontaneousSummary +----------+------------+----------+---------+-----------+-------+ IJV           Full                 Yes       Yes            +----------+------------+----------+---------+-----------+-------+ Subclavian                         Yes       Yes            +----------+------------+----------+---------+-----------+-------+  Summary:  Right: Findings consistent with acute deep vein thrombosis involving the right internal jugular vein.  Left: No evidence of thrombosis in the subclavian.  *See table(s) above for measurements and observations.  Diagnosing physician: Ruta Hinds MD Electronically signed by Ruta Hinds MD on 11/24/2017 at 4:22:37 PM.    Final     ASSESSMENT: This is a very pleasant 83 years old white male recently diagnosed with limited stage (T4, N3, M0) small cell lung cancer presented with large perihilar mass in addition to bilateral hilar and mediastinal lymphadenopathy as well as right supraclavicular lymphadenopathy diagnosed in November 2019.   PLAN: I had a lengthy discussion with the patient and his family today about his current disease stage, prognosis and treatment options. I recommended for the patient to complete the staging work-up and will order MRI of the brain to rule out brain metastasis. I discussed with the patient his treatment options and recommended for him a course of systemic chemotherapy with cisplatin 80 mg/M2 on day 1 in addition to etoposide 100 mg/M2 on days 1, 2 and 3 every 3 weeks.  This will be concurrent with radiotherapy. I discussed with the patient the adverse effect of the chemotherapy including but not limited to alopecia, myelosuppression, nausea and vomiting, peripheral neuropathy, liver or  renal dysfunction. The patient may be also eligible for clinical trial [id number] after the first cycle of his treatment with the patient would have additional Tecentriq versus placebo with his systemic chemotherapy.  I will arrange for him to see the research nurse today for evaluation and discussion of this treatment option. The patient was seen by radiation oncology today and expected to start concurrent radiotherapy soon. He is expected to start the first cycle of his systemic chemotherapy next week. I will arrange for the patient to have a chemotherapy education class before the first dose of his treatment. I will call his pharmacy with prescription for Compazine 10 mg p.o. every 6 hours as needed for nausea. The patient will come back for follow-up visit in 2 weeks for evaluation and management of any adverse effect of his treatment. He was advised to call immediately if he has any concerning symptoms in the interval.  The patient voices understanding of current disease status and treatment options and is in agreement with the current care plan.  All questions were answered. The patient knows to call the clinic with any problems, questions or concerns. We can certainly see the patient much sooner if necessary.  Thank you so much for allowing me to participate in the care of Eusebio Blazejewski  [place]. I will continue to follow up the patient with you and assist in his care.  I spent 55 minutes counseling the patient face to face. The total time spent in the appointment was 80 minutes.  Disclaimer: This note was dictated with voice recognition software. Similar sounding words can inadvertently be transcribed and may not be corrected upon review.   Eilleen Kempf December 04, 2017, 2:26 PM

## 2017-12-04 NOTE — Progress Notes (Signed)
Oncology Nurse Navigator Documentation  Oncology Nurse Navigator Flowsheets 12/04/2017  Navigator Location [facility]  Navigator Encounter Type Clinic/MDC/I spoke with patient and family today at thoracic clinic.  I gave and explained information on lung cancer and treatment plan.    Abnormal Finding Date 11/14/2017  Confirmed Diagnosis Date 11/24/2017  Multidisiplinary Clinic Date 12/04/2017  Treatment Initiated Date 12/04/2017  Patient Visit Type MedOnc  Treatment Phase Pre-Tx/Tx Discussion  Barriers/Navigation Needs Education;Coordination of Care  Education Understanding Cancer/ Treatment Options;Newly Diagnosed Cancer Education;Other  Interventions Coordination of Care;Education  Coordination of Care Other  Education Method Verbal;Written  Acuity Level 2  Time Spent with Patient 30

## 2017-12-05 ENCOUNTER — Other Ambulatory Visit: Payer: Self-pay | Admitting: Radiation Oncology

## 2017-12-05 DIAGNOSIS — C3490 Malignant neoplasm of unspecified part of unspecified bronchus or lung: Secondary | ICD-10-CM

## 2017-12-06 NOTE — Addendum Note (Signed)
Encounter addended by: Kyung Rudd, MD on: 12/06/2017 9:17 PM  Actions taken: Visit diagnoses modified

## 2017-12-08 ENCOUNTER — Telehealth: Payer: Self-pay | Admitting: Medical Oncology

## 2017-12-08 ENCOUNTER — Ambulatory Visit: Payer: Medicare Other | Admitting: Pulmonary Disease

## 2017-12-08 ENCOUNTER — Telehealth: Payer: Self-pay | Admitting: Oncology

## 2017-12-08 ENCOUNTER — Telehealth: Payer: Self-pay | Admitting: *Deleted

## 2017-12-08 NOTE — Telephone Encounter (Signed)
Spoke with patients wife and verified the patients upcoming appointments.

## 2017-12-08 NOTE — Telephone Encounter (Signed)
Follow up call to patient regarding LU-005 referral and study information provided to him on the 21st when in clinic for [place]. I spoke with patient and spouse on phone and inquired as to whether they had a chance to review the information I provided and if they had any questions. Both spouse and patient state they have not reviewed the information and per spouse, patient is not interested in study. Spouse states that patient has stated that he's unsure whether he wants any treatment at all at this point and spouse is concerned that patient will have additional side effects and "he's already got lots of medical issues." Patient and spouse expressed concern that being on study would be "even more time consuming" than receiving the treatment alone, if patient decides to receive treatment. I informed them both that the addition of the study drug should not add more visits then he would already be scheduled for, I also reminded them that this is a randomized study and that he maybe randomized to the standard treatment group. Patient will be in clinic tomorrow for chemo education class and I asked them if it was alright for [place] to meet for a few minutes and I could go over the treatment for this study as well as answer other questions they may have. Both spouse and patient stated they would like that. We made arrangements to meet after patient's chemo education class tomorrow. Patient and spouse thanked for their time and encouraged to call Dr. Julien Nordmann or myself with questions in the mean time.  Maxwell Marion, RN, BSN, [facility] Clinical Research 12/08/2017 11:51 AM

## 2017-12-08 NOTE — Telephone Encounter (Signed)
CALLED PATIENT TO INFORM OF MRI FOR 12-13-17 - ARRIVAL TIME - 8:30 AM @ [facility], NO RESTRICTIONS TO TEST, SPOKE WITH PATIENT'S WIFE AND SHE IS AWARE OF THIS TEST

## 2017-12-09 ENCOUNTER — Other Ambulatory Visit: Payer: Medicare Other

## 2017-12-09 ENCOUNTER — Inpatient Hospital Stay: Payer: Medicare Other

## 2017-12-09 ENCOUNTER — Encounter: Payer: Self-pay | Admitting: Internal Medicine

## 2017-12-09 ENCOUNTER — Encounter: Payer: Self-pay | Admitting: Medical Oncology

## 2017-12-09 ENCOUNTER — Other Ambulatory Visit: Payer: Self-pay | Admitting: Medical Oncology

## 2017-12-09 DIAGNOSIS — C3411 Malignant neoplasm of upper lobe, right bronchus or lung: Secondary | ICD-10-CM

## 2017-12-09 DIAGNOSIS — I878 Other specified disorders of veins: Secondary | ICD-10-CM

## 2017-12-09 DIAGNOSIS — Z5111 Encounter for antineoplastic chemotherapy: Secondary | ICD-10-CM | POA: Diagnosis not present

## 2017-12-09 LAB — CBC WITH DIFFERENTIAL (CANCER CENTER ONLY)
ABS IMMATURE GRANULOCYTES: 0.03 10*3/uL (ref 0.00–0.07)
BASOS ABS: 0 10*3/uL (ref 0.0–0.1)
Basophils Relative: 1 %
Eosinophils Absolute: 0.1 10*3/uL (ref 0.0–0.5)
Eosinophils Relative: 1 %
HEMATOCRIT: 42.8 % (ref 39.0–52.0)
Hemoglobin: 13.8 g/dL (ref 13.0–17.0)
IMMATURE GRANULOCYTES: 0 %
LYMPHS PCT: 15 %
Lymphs Abs: 1.3 10*3/uL (ref 0.7–4.0)
MCH: 27.8 pg (ref 26.0–34.0)
MCHC: 32.2 g/dL (ref 30.0–36.0)
MCV: 86.3 fL (ref 80.0–100.0)
MONOS PCT: 8 %
Monocytes Absolute: 0.7 10*3/uL (ref 0.1–1.0)
NEUTROS ABS: 6.4 10*3/uL (ref 1.7–7.7)
NEUTROS PCT: 75 %
NRBC: 0 % (ref 0.0–0.2)
PLATELETS: 203 10*3/uL (ref 150–400)
RBC: 4.96 MIL/uL (ref 4.22–5.81)
RDW: 13.6 % (ref 11.5–15.5)
WBC Count: 8.5 10*3/uL (ref 4.0–10.5)

## 2017-12-09 LAB — CMP (CANCER CENTER ONLY)
ALT: 15 U/L (ref 0–44)
AST: 21 U/L (ref 15–41)
Albumin: 3.9 g/dL (ref 3.5–5.0)
Alkaline Phosphatase: 64 U/L (ref 38–126)
Anion gap: 10 (ref 5–15)
BILIRUBIN TOTAL: 0.5 mg/dL (ref 0.3–1.2)
BUN: 14 mg/dL (ref 8–23)
CHLORIDE: 106 mmol/L (ref 98–111)
CO2: 25 mmol/L (ref 22–32)
CREATININE: 0.9 mg/dL (ref 0.61–1.24)
Calcium: 9.9 mg/dL (ref 8.9–10.3)
GFR, Est AFR Am: >60 mL/min (ref >60)
Glucose, Bld: 128 mg/dL — ABNORMAL HIGH (ref 70–99)
POTASSIUM: 4.5 mmol/L (ref 3.5–5.1)
Sodium: 141 mmol/L (ref 135–145)
Total Protein: 7.2 g/dL (ref 6.5–8.1)

## 2017-12-09 LAB — MAGNESIUM: MAGNESIUM: 2 mg/dL (ref 1.7–2.4)

## 2017-12-09 NOTE — Research (Signed)
[id number] I met with patient and spouse today after their scheduled chemo-education class, as we discussed yesterday over the phone. Chemo education nurse Royston Sinner was present during our meeting today. I asked patient and spouse if there were any questions that I could answer for them beforehand and they denied having any. I explained to patient, again the study, the randomization process, the study drug and the treatment schedule. Patient and spouse were informed that the study drug is supplied by the study at no cost to them, they were also informed that should he be randomized to the Atezolizumab group, it would not add additional visits to his treatment days and would only extend his day 2 treatment by an additional hour or so. Patient and spouse's concern was that they would have to come to clinic more often. After all their questions were answered, patient stated he did not wish to participate in study. Patient states that he does not to feel like a "[place] pig" and that he is concerned about having additional side effects should he receive study drug, since he feels his health is not well to start with. I gave patient and spouse my understanding and thanked them for their time and consideration of study and encouraged them to call Dr. Julien Nordmann with any further questions or concerns they may have.  Dr. Julien Nordmann informed of patient's decision.  Scott Marion, RN, BSN, [facility] Clinical Research 12/09/2017 3:44 PM

## 2017-12-09 NOTE — Progress Notes (Signed)
Pt's wife called inquiring about financial assistance.  I informed her unfortunately there aren't any foundations offering copay assistance at this time.  She sounded very distraught saying maybe he just shouldn't get treatment because they won't be able to afford it.  I stated that would be a discussion between them and his Dr.  I informed her they would be able to set up payment arrangements with the hospital or apply for the Access One card to make the payments smaller.  I also informed her of the [place], went over what it covers and gave her the income requirement.  She would like to apply so she will bring proof of income on 12/10/17.  I will also give them a Medicaid application at that time.

## 2017-12-10 ENCOUNTER — Other Ambulatory Visit: Payer: Self-pay | Admitting: Medical Oncology

## 2017-12-10 ENCOUNTER — Inpatient Hospital Stay: Payer: Medicare Other

## 2017-12-10 VITALS — BP 119/89 | HR 87 | Temp 98.5°F | Resp 18

## 2017-12-10 DIAGNOSIS — Z5111 Encounter for antineoplastic chemotherapy: Secondary | ICD-10-CM | POA: Diagnosis not present

## 2017-12-10 DIAGNOSIS — C3411 Malignant neoplasm of upper lobe, right bronchus or lung: Secondary | ICD-10-CM

## 2017-12-10 MED ORDER — SODIUM CHLORIDE 0.9 % IV SOLN
Freq: Once | INTRAVENOUS | Status: AC
  Administered 2017-12-10: 09:00:00 via INTRAVENOUS
  Filled 2017-12-10: qty 250

## 2017-12-10 MED ORDER — PALONOSETRON HCL INJECTION 0.25 MG/5ML
0.2500 mg | Freq: Once | INTRAVENOUS | Status: AC
  Administered 2017-12-10: 0.25 mg via INTRAVENOUS

## 2017-12-10 MED ORDER — SODIUM CHLORIDE 0.9 % IV SOLN
80.0000 mg/m2 | Freq: Once | INTRAVENOUS | Status: AC
  Administered 2017-12-10: 168 mg via INTRAVENOUS
  Filled 2017-12-10: qty 168

## 2017-12-10 MED ORDER — POTASSIUM CHLORIDE 2 MEQ/ML IV SOLN
Freq: Once | INTRAVENOUS | Status: AC
  Administered 2017-12-10: 09:00:00 via INTRAVENOUS
  Filled 2017-12-10: qty 10

## 2017-12-10 MED ORDER — PALONOSETRON HCL INJECTION 0.25 MG/5ML
INTRAVENOUS | Status: AC
  Filled 2017-12-10: qty 5

## 2017-12-10 MED ORDER — SODIUM CHLORIDE 0.9 % IV SOLN
97.0000 mg/m2 | Freq: Once | INTRAVENOUS | Status: AC
  Administered 2017-12-10: 200 mg via INTRAVENOUS
  Filled 2017-12-10: qty 10

## 2017-12-10 MED ORDER — SODIUM CHLORIDE 0.9 % IV SOLN
Freq: Once | INTRAVENOUS | Status: AC
  Administered 2017-12-10: 11:00:00 via INTRAVENOUS
  Filled 2017-12-10: qty 5

## 2017-12-10 NOTE — Patient Instructions (Signed)
[place] Discharge Instructions for Patients Receiving Chemotherapy  Today you received the following chemotherapy agents: cisplatin (Platinol) and etoposide.  To help prevent nausea and vomiting after your treatment, we encourage you to take your nausea medication as directed by your physician.    If you develop nausea and vomiting that is not controlled by your nausea medication, call the clinic.   BELOW ARE SYMPTOMS THAT SHOULD BE REPORTED IMMEDIATELY:  *FEVER GREATER THAN 100.5 F  *CHILLS WITH OR WITHOUT FEVER  NAUSEA AND VOMITING THAT IS NOT CONTROLLED WITH YOUR NAUSEA MEDICATION  *UNUSUAL SHORTNESS OF BREATH  *UNUSUAL BRUISING OR BLEEDING  TENDERNESS IN MOUTH AND THROAT WITH OR WITHOUT PRESENCE OF ULCERS  *URINARY PROBLEMS  *BOWEL PROBLEMS  UNUSUAL RASH Items with * indicate a potential emergency and should be followed up as soon as possible.  Feel free to call the clinic should you have any questions or concerns. The clinic phone number is [phone].  Please show the [place] at check-in to the Emergency Department and triage nurse.  Cisplatin injection What is this medicine? CISPLATIN (SIS pla tin) is a chemotherapy drug. It targets fast dividing cells, like cancer cells, and causes these cells to die. This medicine is used to treat many types of cancer like bladder, ovarian, and testicular cancers. This medicine may be used for other purposes; ask your health care provider or pharmacist if you have questions. COMMON BRAND NAME(S): Platinol, Platinol -AQ What should I tell my health care provider before I take this medicine? They need to know if you have any of these conditions: -blood disorders -hearing problems -kidney disease -recent or ongoing radiation therapy -an unusual or allergic reaction to cisplatin, carboplatin, other chemotherapy, other medicines, foods, dyes, or preservatives -pregnant or trying to get  pregnant -breast-feeding How should I use this medicine? This drug is given as an infusion into a vein. It is administered in a hospital or clinic by a specially trained health care professional. Talk to your pediatrician regarding the use of this medicine in children. Special care may be needed. Overdosage: If you think you have taken too much of this medicine contact a poison control center or emergency room at once. NOTE: This medicine is only for you. Do not share this medicine with others. What if I miss a dose? It is important not to miss a dose. Call your doctor or health care professional if you are unable to keep an appointment. What may interact with this medicine? -dofetilide -foscarnet -medicines for seizures -medicines to increase blood counts like filgrastim, pegfilgrastim, sargramostim -probenecid -pyridoxine used with altretamine -rituximab -some antibiotics like amikacin, gentamicin, neomycin, polymyxin B, streptomycin, tobramycin -sulfinpyrazone -vaccines -zalcitabine Talk to your doctor or health care professional before taking any of these medicines: -acetaminophen -aspirin -ibuprofen -ketoprofen -naproxen This list may not describe all possible interactions. Give your health care provider a list of all the medicines, herbs, non-prescription drugs, or dietary supplements you use. Also tell them if you smoke, drink alcohol, or use illegal drugs. Some items may interact with your medicine. What should I watch for while using this medicine? Your condition will be monitored carefully while you are receiving this medicine. You will need important blood work done while you are taking this medicine. This drug may make you feel generally unwell. This is not uncommon, as chemotherapy can affect healthy cells as well as cancer cells. Report any side effects. Continue your course of treatment even though you feel ill  unless your doctor tells you to stop. In some cases, you may  be given additional medicines to help with side effects. Follow all directions for their use. Call your doctor or health care professional for advice if you get a fever, chills or sore throat, or other symptoms of a cold or flu. Do not treat yourself. This drug decreases your body's ability to fight infections. Try to avoid being around people who are sick. This medicine may increase your risk to bruise or bleed. Call your doctor or health care professional if you notice any unusual bleeding. Be careful brushing and flossing your teeth or using a toothpick because you may get an infection or bleed more easily. If you have any dental work done, tell your dentist you are receiving this medicine. Avoid taking products that contain aspirin, acetaminophen, ibuprofen, naproxen, or ketoprofen unless instructed by your doctor. These medicines may hide a fever. Do not become pregnant while taking this medicine. Women should inform their doctor if they wish to become pregnant or think they might be pregnant. There is a potential for serious side effects to an unborn child. Talk to your health care professional or pharmacist for more information. Do not breast-feed an infant while taking this medicine. Drink fluids as directed while you are taking this medicine. This will help protect your kidneys. Call your doctor or health care professional if you get diarrhea. Do not treat yourself. What side effects may I notice from receiving this medicine? Side effects that you should report to your doctor or health care professional as soon as possible: -allergic reactions like skin rash, itching or hives, swelling of the face, lips, or tongue -signs of infection - fever or chills, cough, sore throat, pain or difficulty passing urine -signs of decreased platelets or bleeding - bruising, pinpoint red spots on the skin, black, tarry stools, nosebleeds -signs of decreased red blood cells - unusually weak or tired, fainting  spells, lightheadedness -breathing problems -changes in hearing -gout pain -low blood counts - This drug may decrease the number of white blood cells, red blood cells and platelets. You may be at increased risk for infections and bleeding. -nausea and vomiting -pain, swelling, redness or irritation at the injection site -pain, tingling, numbness in the hands or feet -problems with balance, movement -trouble passing urine or change in the amount of urine Side effects that usually do not require medical attention (report to your doctor or health care professional if they continue or are bothersome): -changes in vision -loss of appetite -metallic taste in the mouth or changes in taste This list may not describe all possible side effects. Call your doctor for medical advice about side effects. You may report side effects to FDA at 1-800-FDA-1088. Where should I keep my medicine? This drug is given in a hospital or clinic and will not be stored at home. NOTE: This sheet is a summary. It may not cover all possible information. If you have questions about this medicine, talk to your doctor, pharmacist, or health care provider.  2018 Elsevier/Gold Standard (2007-04-07 14:40:54)  Etoposide, VP-16 injection What is this medicine? ETOPOSIDE, VP-16 (e toe POE side) is a chemotherapy drug. It is used to treat testicular cancer, lung cancer, and other cancers. This medicine may be used for other purposes; ask your health care provider or pharmacist if you have questions. COMMON BRAND NAME(S): Etopophos, Toposar, VePesid What should I tell my health care provider before I take this medicine? They need to know if  you have any of these conditions: -infection -kidney disease -liver disease -low blood counts, like low white cell, platelet, or red cell counts -an unusual or allergic reaction to etoposide, other medicines, foods, dyes, or preservatives -pregnant or trying to get  pregnant -breast-feeding How should I use this medicine? This medicine is for infusion into a vein. It is administered in a hospital or clinic by a specially trained health care professional. Talk to your pediatrician regarding the use of this medicine in children. Special care may be needed. Overdosage: If you think you have taken too much of this medicine contact a poison control center or emergency room at once. NOTE: This medicine is only for you. Do not share this medicine with others. What if I miss a dose? It is important not to miss your dose. Call your doctor or health care professional if you are unable to keep an appointment. What may interact with this medicine? -aspirin -certain medications for seizures like carbamazepine, phenobarbital, phenytoin, valproic acid -cyclosporine -levamisole -warfarin This list may not describe all possible interactions. Give your health care provider a list of all the medicines, herbs, non-prescription drugs, or dietary supplements you use. Also tell them if you smoke, drink alcohol, or use illegal drugs. Some items may interact with your medicine. What should I watch for while using this medicine? Visit your doctor for checks on your progress. This drug may make you feel generally unwell. This is not uncommon, as chemotherapy can affect healthy cells as well as cancer cells. Report any side effects. Continue your course of treatment even though you feel ill unless your doctor tells you to stop. In some cases, you may be given additional medicines to help with side effects. Follow all directions for their use. Call your doctor or health care professional for advice if you get a fever, chills or sore throat, or other symptoms of a cold or flu. Do not treat yourself. This drug decreases your body's ability to fight infections. Try to avoid being around people who are sick. This medicine may increase your risk to bruise or bleed. Call your doctor or health  care professional if you notice any unusual bleeding. Talk to your doctor about your risk of cancer. You may be more at risk for certain types of cancers if you take this medicine. Do not become pregnant while taking this medicine or for at least 6 months after stopping it. Women should inform their doctor if they wish to become pregnant or think they might be pregnant. Women of child-bearing potential will need to have a negative pregnancy test before starting this medicine. There is a potential for serious side effects to an unborn child. Talk to your health care professional or pharmacist for more information. Do not breast-feed an infant while taking this medicine. Men must use a latex condom during sexual contact with a woman while taking this medicine and for at least 4 months after stopping it. A latex condom is needed even if you have had a vasectomy. Contact your doctor right away if your partner becomes pregnant. Do not donate sperm while taking this medicine and for at least 4 months after you stop taking this medicine. Men should inform their doctors if they wish to father a child. This medicine may lower sperm counts. What side effects may I notice from receiving this medicine? Side effects that you should report to your doctor or health care professional as soon as possible: -allergic reactions like skin rash, itching or  hives, swelling of the face, lips, or tongue -low blood counts - this medicine may decrease the number of white blood cells, red blood cells and platelets. You may be at increased risk for infections and bleeding. -signs of infection - fever or chills, cough, sore throat, pain or difficulty passing urine -signs of decreased platelets or bleeding - bruising, pinpoint red spots on the skin, black, tarry stools, blood in the urine -signs of decreased red blood cells - unusually weak or tired, fainting spells, lightheadedness -breathing problems -changes in vision -mouth or  throat sores or ulcers -pain, redness, swelling or irritation at the injection site -pain, tingling, numbness in the hands or feet -redness, blistering, peeling or loosening of the skin, including inside the mouth -seizures -vomiting Side effects that usually do not require medical attention (report to your doctor or health care professional if they continue or are bothersome): -diarrhea -hair loss -loss of appetite -nausea -stomach pain This list may not describe all possible side effects. Call your doctor for medical advice about side effects. You may report side effects to FDA at 1-800-FDA-1088. Where should I keep my medicine? This drug is given in a hospital or clinic and will not be stored at home. NOTE: This sheet is a summary. It may not cover all possible information. If you have questions about this medicine, talk to your doctor, pharmacist, or health care provider.  2018 Elsevier/Gold Standard (2014-12-23 11:53:23)

## 2017-12-12 ENCOUNTER — Telehealth: Payer: Self-pay | Admitting: Medical Oncology

## 2017-12-12 ENCOUNTER — Inpatient Hospital Stay: Payer: Medicare Other

## 2017-12-12 VITALS — BP 123/84 | HR 70 | Temp 98.0°F | Resp 16

## 2017-12-12 DIAGNOSIS — C3411 Malignant neoplasm of upper lobe, right bronchus or lung: Secondary | ICD-10-CM

## 2017-12-12 DIAGNOSIS — Z5111 Encounter for antineoplastic chemotherapy: Secondary | ICD-10-CM | POA: Diagnosis not present

## 2017-12-12 MED ORDER — SODIUM CHLORIDE 0.9 % IV SOLN
97.0000 mg/m2 | Freq: Once | INTRAVENOUS | Status: AC
  Administered 2017-12-12: 200 mg via INTRAVENOUS
  Filled 2017-12-12: qty 10

## 2017-12-12 MED ORDER — SODIUM CHLORIDE 0.9 % IV SOLN
INTRAVENOUS | Status: AC
  Administered 2017-12-12: 09:00:00 via INTRAVENOUS
  Filled 2017-12-12 (×2): qty 250

## 2017-12-12 MED ORDER — ONDANSETRON HCL 4 MG/2ML IJ SOLN
8.0000 mg | Freq: Once | INTRAMUSCULAR | Status: AC
  Administered 2017-12-12: 8 mg via INTRAVENOUS

## 2017-12-12 MED ORDER — DEXAMETHASONE SODIUM PHOSPHATE 10 MG/ML IJ SOLN
INTRAMUSCULAR | Status: AC
  Filled 2017-12-12: qty 1

## 2017-12-12 MED ORDER — SODIUM CHLORIDE 0.9 % IV SOLN
Freq: Once | INTRAVENOUS | Status: DC
  Filled 2017-12-12: qty 250

## 2017-12-12 MED ORDER — ONDANSETRON HCL 4 MG/2ML IJ SOLN
INTRAMUSCULAR | Status: AC
  Filled 2017-12-12: qty 4

## 2017-12-12 MED ORDER — DEXAMETHASONE SODIUM PHOSPHATE 10 MG/ML IJ SOLN
10.0000 mg | Freq: Once | INTRAMUSCULAR | Status: AC
  Administered 2017-12-12: 10 mg via INTRAVENOUS

## 2017-12-12 MED ORDER — SODIUM CHLORIDE 0.9 % IV SOLN: 10.0000 mg | Freq: Once | INTRAVENOUS | Status: DC

## 2017-12-12 NOTE — Progress Notes (Signed)
Call Dr. Julien Nordmann and reported vomiting and unable to keep anything down today. Zofran ordered and normal saline 1 liter over 2 hours.

## 2017-12-12 NOTE — Telephone Encounter (Signed)
LM then realized pt here for day 2 chemo.

## 2017-12-12 NOTE — Patient Instructions (Signed)
[place] Discharge Instructions for Patients Receiving Chemotherapy  Today you received the following chemotherapy agents: Etoposide.  To help prevent nausea and vomiting after your treatment, we encourage you to take your nausea medication as directed by your physician.    If you develop nausea and vomiting that is not controlled by your nausea medication, call the clinic.   BELOW ARE SYMPTOMS THAT SHOULD BE REPORTED IMMEDIATELY:  *FEVER GREATER THAN 100.5 F  *CHILLS WITH OR WITHOUT FEVER  NAUSEA AND VOMITING THAT IS NOT CONTROLLED WITH YOUR NAUSEA MEDICATION  *UNUSUAL SHORTNESS OF BREATH  *UNUSUAL BRUISING OR BLEEDING  TENDERNESS IN MOUTH AND THROAT WITH OR WITHOUT PRESENCE OF ULCERS  *URINARY PROBLEMS  *BOWEL PROBLEMS  UNUSUAL RASH Items with * indicate a potential emergency and should be followed up as soon as possible.  Feel free to call the clinic should you have any questions or concerns. The clinic phone number is [phone].  Please show the [place] at check-in to the Emergency Department and triage nurse.   Dehydration, Adult Dehydration is a condition in which there is not enough fluid or water in the body. This happens when you lose more fluids than you take in. Important organs, such as the kidneys, brain, and heart, cannot function without a proper amount of fluids. Any loss of fluids from the body can lead to dehydration. Dehydration can range from mild to severe. This condition should be treated right away to prevent it from becoming severe. What are the causes? This condition may be caused by:  Vomiting.  Diarrhea.  Excessive sweating, such as from heat exposure or exercise.  Not drinking enough fluid, especially: ? When ill. ? While doing activity that requires a lot of energy.  Excessive urination.  Fever.  Infection.  Certain medicines, such as medicines that cause the body to lose excess fluid  (diuretics).  Inability to access safe drinking water.  Reduced physical ability to get adequate water and food.  What increases the risk? This condition is more likely to develop in people:  Who have a poorly controlled long-term (chronic) illness, such as diabetes, heart disease, or kidney disease.  Who are age 1 or older.  Who are disabled.  Who live in a place with high altitude.  Who play endurance sports.  What are the signs or symptoms? Symptoms of mild dehydration may include:  Thirst.  Dry lips.  Slightly dry mouth.  Dry, warm skin.  Dizziness. Symptoms of moderate dehydration may include:  Very dry mouth.  Muscle cramps.  Dark urine. Urine may be the color of tea.  Decreased urine production.  Decreased tear production.  Heartbeat that is irregular or faster than normal (palpitations).  Headache.  Light-headedness, especially when you stand up from a sitting position.  Fainting (syncope). Symptoms of severe dehydration may include:  Changes in skin, such as: ? Cold and clammy skin. ? Blotchy (mottled) or pale skin. ? Skin that does not quickly return to normal after being lightly pinched and released (poor skin turgor).  Changes in body fluids, such as: ? Extreme thirst. ? No tear production. ? Inability to sweat when body temperature is high, such as in hot weather. ? Very little urine production.  Changes in vital signs, such as: ? Weak pulse. ? Pulse that is more than 100 beats a minute when sitting still. ? Rapid breathing. ? Low blood pressure.  Other changes, such as: ? Sunken eyes. ? Cold hands and feet. ?  Confusion. ? Lack of energy (lethargy). ? Difficulty waking up from sleep. ? Short-term weight loss. ? Unconsciousness. How is this diagnosed? This condition is diagnosed based on your symptoms and a physical exam. Blood and urine tests may be done to help confirm the diagnosis. How is this treated? Treatment for this  condition depends on the severity. Mild or moderate dehydration can often be treated at home. Treatment should be started right away. Do not wait until dehydration becomes severe. Severe dehydration is an emergency and it needs to be treated in a hospital. Treatment for mild dehydration may include:  Drinking more fluids.  Replacing salts and minerals in your blood (electrolytes) that you may have lost. Treatment for moderate dehydration may include:  Drinking an oral rehydration solution (ORS). This is a drink that helps you replace fluids and electrolytes (rehydrate). It can be found at pharmacies and retail stores. Treatment for severe dehydration may include:  Receiving fluids through an IV tube.  Receiving an electrolyte solution through a feeding tube that is passed through your nose and into your stomach (nasogastric tube, or NG tube).  Correcting any abnormalities in electrolytes.  Treating the underlying cause of dehydration. Follow these instructions at home:  If directed by your health care provider, drink an ORS: ? Make an ORS by following instructions on the package. ? Start by drinking small amounts, about  cup (120 mL) every 5-10 minutes. ? Slowly increase how much you drink until you have taken the amount recommended by your health care provider.  Drink enough clear fluid to keep your urine clear or pale yellow. If you were told to drink an ORS, finish the ORS first, then start slowly drinking other clear fluids. Drink fluids such as: ? Water. Do not drink only water. Doing that can lead to having too little salt (sodium) in the body (hyponatremia). ? Ice chips. ? Fruit juice that you have added water to (diluted fruit juice). ? Low-calorie sports drinks.  Avoid: ? Alcohol. ? Drinks that contain a lot of sugar. These include high-calorie sports drinks, fruit juice that is not diluted, and soda. ? Caffeine. ? Foods that are greasy or contain a lot of fat or  sugar.  Take over-the-counter and prescription medicines only as told by your health care provider.  Do not take sodium tablets. This can lead to having too much sodium in the body (hypernatremia).  Eat foods that contain a healthy balance of electrolytes, such as bananas, oranges, potatoes, tomatoes, and spinach.  Keep all follow-up visits as told by your health care provider. This is important. Contact a health care provider if:  You have abdominal pain that: ? Gets worse. ? Stays in one area (localizes).  You have a rash.  You have a stiff neck.  You are more irritable than usual.  You are sleepier or more difficult to wake up than usual.  You feel weak or dizzy.  You feel very thirsty.  You have urinated only a small amount of very dark urine over 6-8 hours. Get help right away if:  You have symptoms of severe dehydration.  You cannot drink fluids without vomiting.  Your symptoms get worse with treatment.  You have a fever.  You have a severe headache.  You have vomiting or diarrhea that: ? Gets worse. ? Does not go away.  You have blood or green matter (bile) in your vomit.  You have blood in your stool. This may cause stool to look black and  tarry.  You have not urinated in 6-8 hours.  You faint.  Your heart rate while sitting still is over 100 beats a minute.  You have trouble breathing. This information is not intended to replace advice given to you by your health care provider. Make sure you discuss any questions you have with your health care provider. Document Released: 12/31/2004 Document Revised: 07/28/2015 Document Reviewed: 02/24/2015 Elsevier Interactive Patient Education  [employer].

## 2017-12-13 ENCOUNTER — Ambulatory Visit (HOSPITAL_COMMUNITY)
Admission: RE | Admit: 2017-12-13 | Discharge: 2017-12-13 | Disposition: A | Discharge status: Home / Self Care | Payer: Medicare Other | Source: Ambulatory Visit | Attending: Radiation Oncology | Admitting: Radiation Oncology

## 2017-12-13 ENCOUNTER — Inpatient Hospital Stay: Payer: Medicare Other

## 2017-12-13 VITALS — BP 151/67 | HR 66 | Temp 98.0°F | Resp 16

## 2017-12-13 DIAGNOSIS — C3490 Malignant neoplasm of unspecified part of unspecified bronchus or lung: Secondary | ICD-10-CM

## 2017-12-13 DIAGNOSIS — Z5111 Encounter for antineoplastic chemotherapy: Secondary | ICD-10-CM | POA: Diagnosis not present

## 2017-12-13 DIAGNOSIS — C3411 Malignant neoplasm of upper lobe, right bronchus or lung: Secondary | ICD-10-CM

## 2017-12-13 MED ORDER — SODIUM CHLORIDE 0.9 % IV SOLN
95.0000 mg/m2 | Freq: Once | INTRAVENOUS | Status: AC
  Administered 2017-12-13: 200 mg via INTRAVENOUS
  Filled 2017-12-13: qty 10

## 2017-12-13 MED ORDER — ONDANSETRON HCL 4 MG/2ML IJ SOLN
8.0000 mg | Freq: Once | INTRAMUSCULAR | Status: AC
  Administered 2017-12-13: 8 mg via INTRAVENOUS

## 2017-12-13 MED ORDER — DEXAMETHASONE SODIUM PHOSPHATE 10 MG/ML IJ SOLN
INTRAMUSCULAR | Status: AC
  Filled 2017-12-13: qty 1

## 2017-12-13 MED ORDER — PEGFILGRASTIM 6 MG/0.6ML ~~LOC~~ PSKT
6.0000 mg | PREFILLED_SYRINGE | Freq: Once | SUBCUTANEOUS | Status: AC
  Administered 2017-12-13: 6 mg via SUBCUTANEOUS

## 2017-12-13 MED ORDER — ONDANSETRON HCL 4 MG/2ML IJ SOLN
INTRAMUSCULAR | Status: AC
  Filled 2017-12-13: qty 4

## 2017-12-13 MED ORDER — PEGFILGRASTIM 6 MG/0.6ML ~~LOC~~ PSKT
PREFILLED_SYRINGE | SUBCUTANEOUS | Status: AC
  Filled 2017-12-13: qty 0.6

## 2017-12-13 MED ORDER — SODIUM CHLORIDE 0.9 % IV SOLN
Freq: Once | INTRAVENOUS | Status: AC
  Administered 2017-12-13: 10:00:00 via INTRAVENOUS
  Filled 2017-12-13: qty 250

## 2017-12-13 MED ORDER — DEXAMETHASONE SODIUM PHOSPHATE 10 MG/ML IJ SOLN
10.0000 mg | Freq: Once | INTRAMUSCULAR | Status: AC
  Administered 2017-12-13: 10 mg via INTRAVENOUS

## 2017-12-13 NOTE — Patient Instructions (Signed)
[facility] Discharge Instructions for Patients Receiving Chemotherapy  Today you received the following chemotherapy agents: Etoposide.  To help prevent nausea and vomiting after your treatment, we encourage you to take your nausea medication as directed by your physician.    If you develop nausea and vomiting that is not controlled by your nausea medication, call the clinic.   BELOW ARE SYMPTOMS THAT SHOULD BE REPORTED IMMEDIATELY:  *FEVER GREATER THAN 100.5 F  *CHILLS WITH OR WITHOUT FEVER  NAUSEA AND VOMITING THAT IS NOT CONTROLLED WITH YOUR NAUSEA MEDICATION  *UNUSUAL SHORTNESS OF BREATH  *UNUSUAL BRUISING OR BLEEDING  TENDERNESS IN MOUTH AND THROAT WITH OR WITHOUT PRESENCE OF ULCERS  *URINARY PROBLEMS  *BOWEL PROBLEMS  UNUSUAL RASH Items with * indicate a potential emergency and should be followed up as soon as possible.  Feel free to call the clinic should you have any questions or concerns. The clinic phone number is [phone].  Please show the [place] at check-in to the Emergency Department and triage nurse.

## 2017-12-13 NOTE — Progress Notes (Signed)
Patient with increase nausea.  Pt reported taking oral compazine at 6am.  No relief.  Verbal orders obtained from Dr. Julien Nordmann for Zofran 8mg  IV.

## 2017-12-15 ENCOUNTER — Other Ambulatory Visit: Payer: Medicare Other

## 2017-12-15 ENCOUNTER — Other Ambulatory Visit: Payer: Self-pay | Admitting: *Deleted

## 2017-12-15 ENCOUNTER — Ambulatory Visit: Payer: Medicare Other

## 2017-12-15 ENCOUNTER — Inpatient Hospital Stay: Payer: Medicare Other | Attending: Internal Medicine

## 2017-12-15 ENCOUNTER — Telehealth: Payer: Self-pay | Admitting: Pulmonary Disease

## 2017-12-15 DIAGNOSIS — Z5111 Encounter for antineoplastic chemotherapy: Secondary | ICD-10-CM | POA: Diagnosis present

## 2017-12-15 DIAGNOSIS — R630 Anorexia: Secondary | ICD-10-CM | POA: Diagnosis not present

## 2017-12-15 DIAGNOSIS — F40240 Claustrophobia: Secondary | ICD-10-CM | POA: Insufficient documentation

## 2017-12-15 DIAGNOSIS — C3411 Malignant neoplasm of upper lobe, right bronchus or lung: Secondary | ICD-10-CM | POA: Diagnosis present

## 2017-12-15 DIAGNOSIS — R634 Abnormal weight loss: Secondary | ICD-10-CM | POA: Insufficient documentation

## 2017-12-15 DIAGNOSIS — Z5189 Encounter for other specified aftercare: Secondary | ICD-10-CM | POA: Insufficient documentation

## 2017-12-15 DIAGNOSIS — Z79899 Other long term (current) drug therapy: Secondary | ICD-10-CM | POA: Diagnosis not present

## 2017-12-15 LAB — CBC WITH DIFFERENTIAL (CANCER CENTER ONLY)
Abs Immature Granulocytes: 1.55 10*3/uL — ABNORMAL HIGH (ref 0.00–0.07)
Basophils Absolute: 0 10*3/uL (ref 0.0–0.1)
Basophils Relative: 0 %
Eosinophils Absolute: 0 10*3/uL (ref 0.0–0.5)
Eosinophils Relative: 0 %
HCT: 38.3 % — ABNORMAL LOW (ref 39.0–52.0)
Hemoglobin: 13 g/dL (ref 13.0–17.0)
Immature Granulocytes: 6 %
Lymphocytes Relative: 5 %
Lymphs Abs: 1.4 10*3/uL (ref 0.7–4.0)
MCH: 28.4 pg (ref 26.0–34.0)
MCHC: 33.9 g/dL (ref 30.0–36.0)
MCV: 83.8 fL (ref 80.0–100.0)
Monocytes Absolute: 0.2 10*3/uL (ref 0.1–1.0)
Monocytes Relative: 1 %
Neutro Abs: 24.4 10*3/uL — ABNORMAL HIGH (ref 1.7–7.7)
Neutrophils Relative %: 88 %
Platelet Count: 158 10*3/uL (ref 150–400)
RBC: 4.57 MIL/uL (ref 4.22–5.81)
RDW: 13.7 % (ref 11.5–15.5)
WBC Count: 27.5 10*3/uL — ABNORMAL HIGH (ref 4.0–10.5)
nRBC: 0 % (ref 0.0–0.2)

## 2017-12-15 LAB — CMP (CANCER CENTER ONLY)
ALBUMIN: 3.8 g/dL (ref 3.5–5.0)
ALT: 21 U/L (ref 0–44)
AST: 18 U/L (ref 15–41)
Alkaline Phosphatase: 51 U/L (ref 38–126)
Anion gap: 9 (ref 5–15)
BUN: 15 mg/dL (ref 8–23)
CO2: 22 mmol/L (ref 22–32)
CREATININE: 0.88 mg/dL (ref 0.61–1.24)
Calcium: 9.2 mg/dL (ref 8.9–10.3)
Chloride: 104 mmol/L (ref 98–111)
GFR, Est AFR Am: >60 mL/min (ref >60)
GFR, Estimated: >60 mL/min (ref >60)
Glucose, Bld: 122 mg/dL — ABNORMAL HIGH (ref 70–99)
Potassium: 4 mmol/L (ref 3.5–5.1)
Sodium: 135 mmol/L (ref 135–145)
Total Bilirubin: 0.8 mg/dL (ref 0.3–1.2)
Total Protein: 6.5 g/dL (ref 6.5–8.1)

## 2017-12-15 LAB — MAGNESIUM: Magnesium: 1.8 mg/dL (ref 1.7–2.4)

## 2017-12-15 MED ORDER — ONDANSETRON HCL 8 MG PO TABS: 8.0000 mg | ORAL_TABLET | Freq: Three times a day (TID) | ORAL | 0 refills | Status: DC | PRN

## 2017-12-15 MED ORDER — LORAZEPAM 1 MG PO TABS: 1.0000 mg | ORAL_TABLET | Freq: Three times a day (TID) | ORAL | 0 refills | Status: DC

## 2017-12-15 NOTE — Telephone Encounter (Signed)
atc pt, no answer, line was hung up without anyone speaking.  Called back, line was hung up again.  Wcb.

## 2017-12-15 NOTE — Telephone Encounter (Signed)
Reviewed with MD, notified pt Ativan 1mg  tablet called in pt pharmacy to be taken 30 mintutes prior to MRI. LMOVM for pt or wife to call central scheduling # provided to r/s Brain MRI.

## 2017-12-16 ENCOUNTER — Emergency Department (HOSPITAL_COMMUNITY): Payer: Medicare Other

## 2017-12-16 ENCOUNTER — Observation Stay (HOSPITAL_COMMUNITY)
Admission: EM | Admit: 2017-12-16 | Discharge: 2017-12-19 | Disposition: A | Discharge status: Home / Self Care | Payer: Medicare Other | Attending: Internal Medicine | Admitting: Internal Medicine

## 2017-12-16 ENCOUNTER — Encounter (HOSPITAL_COMMUNITY): Payer: Self-pay | Admitting: Emergency Medicine

## 2017-12-16 ENCOUNTER — Telehealth: Payer: Self-pay | Admitting: *Deleted

## 2017-12-16 DIAGNOSIS — I2510 Atherosclerotic heart disease of native coronary artery without angina pectoris: Secondary | ICD-10-CM | POA: Diagnosis not present

## 2017-12-16 DIAGNOSIS — I82409 Acute embolism and thrombosis of unspecified deep veins of unspecified lower extremity: Secondary | ICD-10-CM | POA: Diagnosis present

## 2017-12-16 DIAGNOSIS — C3400 Malignant neoplasm of unspecified main bronchus: Secondary | ICD-10-CM

## 2017-12-16 DIAGNOSIS — E119 Type 2 diabetes mellitus without complications: Secondary | ICD-10-CM | POA: Insufficient documentation

## 2017-12-16 DIAGNOSIS — Z8711 Personal history of peptic ulcer disease: Secondary | ICD-10-CM | POA: Diagnosis not present

## 2017-12-16 DIAGNOSIS — I313 Pericardial effusion (noninflammatory): Secondary | ICD-10-CM | POA: Diagnosis not present

## 2017-12-16 DIAGNOSIS — J90 Pleural effusion, not elsewhere classified: Secondary | ICD-10-CM | POA: Insufficient documentation

## 2017-12-16 DIAGNOSIS — G4730 Sleep apnea, unspecified: Secondary | ICD-10-CM | POA: Diagnosis present

## 2017-12-16 DIAGNOSIS — Z7901 Long term (current) use of anticoagulants: Secondary | ICD-10-CM | POA: Diagnosis not present

## 2017-12-16 DIAGNOSIS — E7800 Pure hypercholesterolemia, unspecified: Secondary | ICD-10-CM | POA: Insufficient documentation

## 2017-12-16 DIAGNOSIS — Z79899 Other long term (current) drug therapy: Secondary | ICD-10-CM | POA: Diagnosis not present

## 2017-12-16 DIAGNOSIS — K2980 Duodenitis without bleeding: Secondary | ICD-10-CM | POA: Diagnosis present

## 2017-12-16 DIAGNOSIS — D72829 Elevated white blood cell count, unspecified: Secondary | ICD-10-CM | POA: Diagnosis present

## 2017-12-16 DIAGNOSIS — Z86718 Personal history of other venous thrombosis and embolism: Secondary | ICD-10-CM | POA: Insufficient documentation

## 2017-12-16 DIAGNOSIS — C3411 Malignant neoplasm of upper lobe, right bronchus or lung: Principal | ICD-10-CM | POA: Insufficient documentation

## 2017-12-16 DIAGNOSIS — R112 Nausea with vomiting, unspecified: Secondary | ICD-10-CM | POA: Diagnosis present

## 2017-12-16 DIAGNOSIS — Z888 Allergy status to other drugs, medicaments and biological substances status: Secondary | ICD-10-CM | POA: Diagnosis not present

## 2017-12-16 DIAGNOSIS — I712 Thoracic aortic aneurysm, without rupture: Secondary | ICD-10-CM | POA: Diagnosis not present

## 2017-12-16 DIAGNOSIS — I719 Aortic aneurysm of unspecified site, without rupture: Secondary | ICD-10-CM | POA: Diagnosis present

## 2017-12-16 DIAGNOSIS — I700 Atherosclerosis of aorta: Secondary | ICD-10-CM | POA: Diagnosis not present

## 2017-12-16 DIAGNOSIS — Z87891 Personal history of nicotine dependence: Secondary | ICD-10-CM | POA: Insufficient documentation

## 2017-12-16 DIAGNOSIS — I10 Essential (primary) hypertension: Secondary | ICD-10-CM | POA: Diagnosis not present

## 2017-12-16 DIAGNOSIS — J449 Chronic obstructive pulmonary disease, unspecified: Secondary | ICD-10-CM | POA: Diagnosis not present

## 2017-12-16 DIAGNOSIS — E440 Moderate protein-calorie malnutrition: Secondary | ICD-10-CM

## 2017-12-16 DIAGNOSIS — C801 Malignant (primary) neoplasm, unspecified: Secondary | ICD-10-CM

## 2017-12-16 DIAGNOSIS — K279 Peptic ulcer, site unspecified, unspecified as acute or chronic, without hemorrhage or perforation: Secondary | ICD-10-CM | POA: Diagnosis present

## 2017-12-16 LAB — CBC
HCT: 40.1 % (ref 39.0–52.0)
Hemoglobin: 13.3 g/dL (ref 13.0–17.0)
MCH: 28.5 pg (ref 26.0–34.0)
MCHC: 33.2 g/dL (ref 30.0–36.0)
MCV: 85.9 fL (ref 80.0–100.0)
NRBC: 0 % (ref 0.0–0.2)
Platelets: 124 10*3/uL — ABNORMAL LOW (ref 150–400)
RBC: 4.67 MIL/uL (ref 4.22–5.81)
RDW: 13.5 % (ref 11.5–15.5)
WBC: 23.8 10*3/uL — ABNORMAL HIGH (ref 4.0–10.5)

## 2017-12-16 LAB — COMPREHENSIVE METABOLIC PANEL
ALT: 21 U/L (ref 0–44)
AST: 16 U/L (ref 15–41)
Albumin: 4 g/dL (ref 3.5–5.0)
Alkaline Phosphatase: 57 U/L (ref 38–126)
Anion gap: 8 (ref 5–15)
BUN: 14 mg/dL (ref 8–23)
CO2: 22 mmol/L (ref 22–32)
Calcium: 8.9 mg/dL (ref 8.9–10.3)
Chloride: 102 mmol/L (ref 98–111)
Creatinine, Ser: 0.78 mg/dL (ref 0.61–1.24)
GFR calc Af Amer: >60 mL/min (ref >60)
GFR calc non Af Amer: >60 mL/min (ref >60)
Glucose, Bld: 133 mg/dL — ABNORMAL HIGH (ref 70–99)
POTASSIUM: 3.2 mmol/L — AB (ref 3.5–5.1)
SODIUM: 132 mmol/L — AB (ref 135–145)
Total Bilirubin: 1.2 mg/dL (ref 0.3–1.2)
Total Protein: 6.5 g/dL (ref 6.5–8.1)

## 2017-12-16 LAB — URINALYSIS, ROUTINE W REFLEX MICROSCOPIC
Bacteria, UA: NONE SEEN
Bilirubin Urine: NEGATIVE
Glucose, UA: 50 mg/dL — AB
Hgb urine dipstick: NEGATIVE
Ketones, ur: 20 mg/dL — AB
Leukocytes, UA: NEGATIVE
Nitrite: NEGATIVE
Protein, ur: 30 mg/dL — AB
Specific Gravity, Urine: 1.013 (ref 1.005–1.030)
pH: 6 (ref 5.0–8.0)

## 2017-12-16 LAB — LIPASE, BLOOD: LIPASE: 35 U/L (ref 11–51)

## 2017-12-16 MED ORDER — SODIUM CHLORIDE 0.9 % IV BOLUS
1000.0000 mL | Freq: Once | INTRAVENOUS | Status: AC
  Administered 2017-12-16: 1000 mL via INTRAVENOUS

## 2017-12-16 MED ORDER — PANTOPRAZOLE SODIUM 40 MG PO TBEC
40.0000 mg | DELAYED_RELEASE_TABLET | Freq: Once | ORAL | Status: AC
  Administered 2017-12-16: 40 mg via ORAL
  Filled 2017-12-16: qty 1

## 2017-12-16 MED ORDER — APIXABAN 5 MG PO TABS: 5.0000 mg | ORAL_TABLET | Freq: Two times a day (BID) | ORAL | 0 refills | Status: DC

## 2017-12-16 MED ORDER — ONDANSETRON HCL 4 MG/2ML IJ SOLN
4.0000 mg | Freq: Once | INTRAMUSCULAR | Status: AC
  Administered 2017-12-16: 4 mg via INTRAVENOUS
  Filled 2017-12-16: qty 2

## 2017-12-16 MED ORDER — IOPAMIDOL (ISOVUE-300) INJECTION 61%
100.0000 mL | Freq: Once | INTRAVENOUS | Status: AC | PRN
  Administered 2017-12-16: 100 mL via INTRAVENOUS

## 2017-12-16 MED ORDER — METOCLOPRAMIDE HCL 5 MG/ML IJ SOLN
10.0000 mg | Freq: Once | INTRAMUSCULAR | Status: AC
  Administered 2017-12-16: 10 mg via INTRAVENOUS
  Filled 2017-12-16: qty 2

## 2017-12-16 MED ORDER — FAMOTIDINE IN NACL 20-0.9 MG/50ML-% IV SOLN
20.0000 mg | Freq: Once | INTRAVENOUS | Status: AC
  Administered 2017-12-16: 20 mg via INTRAVENOUS
  Filled 2017-12-16: qty 50

## 2017-12-16 MED ORDER — POTASSIUM CHLORIDE 10 MEQ/100ML IV SOLN
10.0000 meq | Freq: Once | INTRAVENOUS | Status: AC
  Administered 2017-12-16: 10 meq via INTRAVENOUS
  Filled 2017-12-16: qty 100

## 2017-12-16 MED ORDER — ONDANSETRON HCL 4 MG/2ML IJ SOLN: 4.0000 mg | Freq: Once | INTRAMUSCULAR | Status: DC

## 2017-12-16 NOTE — Telephone Encounter (Signed)
2 boxes of Eliquis 5mg  given to patient and aware that I will send message to see if there is anything else we can give patient as he is unable to afford.

## 2017-12-16 NOTE — Telephone Encounter (Signed)
CALLED PATIENT TO INFORM OF MRI FOR 12-27-17 - ARRIVAL TIME- 1:10 PM @ [facility], NO RESTRICTIONS TO TEST, SPOKE WITH PATIENT'S WIFE, PATRICIA AND SHE IS AWARE OF THIS MRI

## 2017-12-16 NOTE — ED Triage Notes (Signed)
Pt reports that since Saturday after his treatment had been nauseated and having dry heaves and unable to have oral intake. Wife reports that he was "given extra fluids on Saturday but doesnt see doctor again until 10th". Pt having abd pains. Denies bowel problems.

## 2017-12-16 NOTE — ED Provider Notes (Signed)
[facility] DEPT Provider Note   CSN: [id number] Arrival date & time: 12/16/17  1657     History   Chief Complaint Chief Complaint  Patient presents with  . Nausea    HPI Scott Gomez is a 83 y.o. male who presents with generalized weakness, nausea, vomiting, abdominal pain.  Past medical history significant for recent diagnosis of bronchogenic lung cancer currently undergoing chemoradiation (cisplatin and etoposide), hyperlipidemia, arthritis, anxiety, peptic ulcer disease.  Patient states that he has had decreased appetite, nausea and vomiting for the past month.  Is acutely worsened after receiving chemo last week. He was able to go all three days.  He has been prescribed Compazine and Zofran which she has been taking without relief.  He reports lower abdominal pain which is been going on for the past 3 days.  He has never had pain like this before.  Feels like a stomach ache.  Is constant and nonradiating.  Patient came tonight because he feels that he is dehydrated and would like fluids. His wife states he's been getting weaker and weaker. He denies any fevers, chest pain.  He has cough and shortness of breath which are chronic and he feels are actually improved.  He denies any diarrhea.  He has been urinating.  He denies prior abdominal surgeries.  He had a PET scan in November which did not show metastatic disease.   HPI  Past Medical History:  Diagnosis Date  . Anxiety   . Arthritis    osteoarthritis both hands  . Carpal tunnel syndrome   . Diabetes mellitus    type II  . Fatigue   . Hypercholesteremia   . Hypopotassemia   . Leg cramps   . Nephrolithiasis    Hx of  . Palpitations    Hx of   . Shoulder pain, left   . Snoring   . Testosterone deficiency   . Ulcer    peptic ulcer disease    Patient Active Problem List   Diagnosis Date Noted  . Small cell lung cancer, right upper lobe ([place]) 12/04/2017  . Encounter for  antineoplastic chemotherapy 12/04/2017  . Goals of care, counseling/discussion 12/04/2017  . DVT (deep venous thrombosis) ([place]) 11/27/2017  . Thrush 11/18/2017  . Aortic aneurysm ([place]) 11/18/2017  . Small cell lung cancer ([place]) 11/16/2017  . GERD (gastroesophageal reflux disease) 11/07/2017  . Routine general medical examination at a health care facility 04/09/2015  . Osteoporosis 04/07/2015  . Constipation 05/16/2014  . Polyneuropathy 03/23/2014  . Encounter for Medicare annual wellness exam 11/25/2012  . Spinal stenosis 08/27/2011  . Vitamin B 12 deficiency 08/06/2011  . Obesity 05/13/2011  . Falls frequently 05/01/2011  . Prostate cancer screening 11/07/2010  . DEPRESSION 02/26/2010  . Essential hypertension, benign 03/29/2008  . COPD (chronic obstructive pulmonary disease) ([place]) 07/08/2007  . Sleep apnea 07/08/2007  . TESTOSTERONE DEFICIENCY 06/22/2007  . Prediabetes 01/07/2007  . Generalized anxiety disorder 01/07/2007  . CARPAL TUNNEL SYNDROME 01/07/2007  . PEPTIC ULCER DISEASE 01/07/2007  . OSTEOARTHRITIS, HANDS, BILATERAL 01/07/2007  . SHOULDER PAIN, LEFT, CHRONIC 01/07/2007  . PALPITATIONS, HX OF 01/07/2007  . Nephrolithiasis 01/07/2007    Past Surgical History:  Procedure Laterality Date  . BACK SURGERY  1978   x2 disc        Home Medications    Prior to Admission medications   Medication Sig Start Date End Date Taking? Authorizing Provider  albuterol (PROAIR HFA) 108 (90 Base) MCG/ACT inhaler Inhale  2 puffs into the lungs every 4 (four) hours as needed for wheezing or shortness of breath. 11/03/17   Martyn Ehrich, NP  alendronate (FOSAMAX) 70 MG tablet Take 70 mg by mouth once a week. Take with a full glass of water on an empty stomach.    [provider]  ALPRAZolam (XANAX) 0.5 MG tablet TAKE ONE (1) TABLET BY MOUTH TWO (2) TIMES DAILY AS NEEDED FOR ANXIETY 08/18/17   [name], [name], MD  apixaban (ELIQUIS) 5 MG TABS tablet Take 1 tablet (5 mg  total) by mouth 2 (two) times daily. 12/16/17   Rigoberto Noel, MD  Blood Glucose Monitoring Suppl (ONETOUCH VERIO IQ SYSTEM) w/Device KIT Use to test blood sugar once daily and as needed dx R73.9 10/14/16   [name], [name], MD  cyclobenzaprine (FLEXERIL) 10 MG tablet Take 10 mg by mouth 3 (three) times daily as needed. For muscle spasms  11/19/10   [name], Roque Lias A, MD  docusate sodium (COLACE) 100 MG capsule Take 100 mg by mouth daily.    [provider]  gabapentin (NEURONTIN) 100 MG capsule Take 200 mg by mouth 3 (three) times daily.     [provider]  glucose blood (ONETOUCH VERIO) test strip USE TO CHECK BLOOD SUGAR ONCE DAILY 04/23/17   [name], [name], MD  levalbuterol (XOPENEX) 0.63 MG/3ML nebulizer solution Take 3 mLs (0.63 mg total) by nebulization every 4 (four) hours as needed for wheezing or shortness of breath. 11/07/17   Martyn Ehrich, NP  lisinopril (PRINIVIL,ZESTRIL) 10 MG tablet Take 1 tablet (10 mg total) by mouth daily. 04/14/17   [name], [name], MD  LORazepam (ATIVAN) 1 MG tablet Take 1 tablet (1 mg total) by mouth every 8 (eight) hours. Pt is to take 1 tablet 30 minutes prior to MRI 12/15/17   Curt Bears, MD  magic mouthwash SOLN  11/19/17   [provider]  magic mouthwash w/lidocaine SOLN Take 5 mLs by mouth 3 (three) times daily as needed for mouth pain (swish and spit). 11/18/17   [name], [name], MD  ondansetron (ZOFRAN) 8 MG tablet Take 1 tablet (8 mg total) by mouth every 8 (eight) hours as needed for nausea or vomiting. 12/15/17   Curt Bears, MD  ONE [facility] LANCETS MISC Use to test blood sugar once daily and as needed. Dx 790.29 05/06/13   [name], [name], MD  OVER THE COUNTER MEDICATION Take 1 tablet by mouth daily. Vitamin b 12    [provider]  Oxycodone HCl 10 MG TABS Take 1 tablet by mouth 2 (two) times daily as needed (pain).  10/28/12   [provider]  potassium chloride SA (K-DUR,KLOR-CON) 20 MEQ tablet TAKE ONE  (1) TABLET BY MOUTH EVERY DAY 04/14/17   [name], [name], MD  prochlorperazine (COMPAZINE) 10 MG tablet Take 1 tablet (10 mg total) by mouth every 6 (six) hours as needed for nausea or vomiting. 12/04/17   Curt Bears, MD  Tiotropium Bromide Monohydrate (SPIRIVA RESPIMAT) 2.5 MCG/ACT AERS Inhale 2 puffs into the lungs daily. 11/14/17   Martyn Ehrich, NP  travoprost, benzalkonium, (TRAVATAN) 0.004 % ophthalmic solution Place 1 drop into both eyes at bedtime.      [provider]    Family History Family History  Problem Relation Age of Onset  . Cancer Brother        kidney  . Heart disease Brother        MI  .  Hypertension Mother   . Allergies Mother   . Hypertension Father   . Allergies Father   . Cancer Sister   . Emphysema Sister   . Heart attack Sister     Social History Social History   Tobacco Use  . Smoking status: Former Smoker    Packs/day: 2.00    Years: 38.00    Pack years: 76.00    Types: Cigarettes    Last attempt to quit: 01/14/1993    Years since quitting: 24.9  . Smokeless tobacco: Never Used  Substance Use Topics  . Alcohol use: No    Alcohol/week: 0.0 standard drinks  . Drug use: No     Allergies   Buspirone hcl; Crestor [rosuvastatin calcium]; and Prednisone   Review of Systems Review of Systems  Constitutional: Positive for appetite change and unexpected weight change. Negative for fever.  Respiratory: Positive for cough and shortness of breath. Negative for wheezing.   Cardiovascular: Negative for chest pain, palpitations and leg swelling.  Gastrointestinal: Positive for abdominal pain, nausea and vomiting. Negative for constipation and diarrhea.  Genitourinary: Negative for difficulty urinating and dysuria.  Allergic/Immunologic: Positive for immunocompromised state.  All other systems reviewed and are negative.    Physical Exam Updated Vital Signs BP 132/90 (BP Location: Left Arm)   Pulse 98   Temp 99.1 F (37.3 C)  (Oral)   Resp 16   Ht _0  (1.778 m)   Wt 81.6 kg   SpO2 96%   BMI 25.83 kg/m   Physical Exam  Constitutional: He is oriented to person, place, and time. He appears well-developed and well-nourished. No distress.  Elderly male in NAD. Cooperative  HENT:  Head: Normocephalic and atraumatic.  Eyes: Pupils are equal, round, and reactive to light. Conjunctivae are normal. Right eye exhibits no discharge. Left eye exhibits no discharge. No scleral icterus.  Neck: Normal range of motion.  Cardiovascular: Normal rate and regular rhythm.  Pulmonary/Chest: Effort normal and breath sounds normal. No respiratory distress.  Abdominal: Soft. Bowel sounds are normal. He exhibits no distension and no mass. There is tenderness (lower abdominal and periumbilical tenderness). There is guarding. There is no rebound. No hernia.  Neurological: He is alert and oriented to person, place, and time.  Skin: Skin is warm and dry.  Psychiatric: He has a normal mood and affect. His behavior is normal.  Nursing note and vitals reviewed.    ED Treatments / Results  Labs (all labs ordered are listed, but only abnormal results are displayed) Labs Reviewed  COMPREHENSIVE METABOLIC PANEL - Abnormal; Notable for the following components:      Result Value   Sodium 132 (*)    Potassium 3.2 (*)    Glucose, Bld 133 (*)    All other components within normal limits  CBC - Abnormal; Notable for the following components:   WBC 23.8 (*)    Platelets 124 (*)    All other components within normal limits  LIPASE, BLOOD  URINALYSIS, ROUTINE W REFLEX MICROSCOPIC    EKG None  Radiology Ct Chest W Contrast  Result Date: 12/16/2017 CLINICAL DATA:  Lung cancer. Nausea, hemoptysis, cough. Shortness of breath. EXAM: CT CHEST, ABDOMEN, AND PELVIS WITH CONTRAST TECHNIQUE: Multidetector CT imaging of the chest, abdomen and pelvis was performed following the standard protocol during bolus administration of intravenous  contrast. CONTRAST:  160m ISOVUE-300 IOPAMIDOL (ISOVUE-300) INJECTION 61% COMPARISON:  PET CT 11/26/2017 FINDINGS: CT CHEST FINDINGS Cardiovascular: Tortuosity of the thoracic aorta with scattered  aortic calcifications. Mild aneurysmal dilatation of the ascending thoracic aorta, 4.3 cm maximally. Heart is normal size. Small pericardial effusion, slightly increased since prior PET CT. Mediastinum/Nodes: Right paratracheal/mediastinal conglomerate mass (likely arising from the medial right upper lobe) again noted which extends into the right hilum and subcarinal region, unchanged. Lungs/Pleura: Right upper lobe mass measures up to 2.6 cm, likely slightly decreased in size. Medial right upper lobe mass invading the mediastinum and right hilum again noted, unchanged. Small right pleural effusion, increased since prior study. No confluent opacity or effusion on the left. Musculoskeletal: Degenerative changes throughout the thoracic spine. No acute bony abnormality. CT ABDOMEN PELVIS FINDINGS Hepatobiliary: No focal hepatic abnormality. Gallbladder unremarkable. Pancreas: No focal abnormality or ductal dilatation. Spleen: No focal abnormality.  Normal size. Adrenals/Urinary Tract: No adrenal abnormality. No focal renal abnormality. No stones or hydronephrosis. Urinary bladder is unremarkable. Stomach/Bowel: Scattered colonic diverticulosis. No active diverticulitis. No evidence of bowel obstruction. There is wall thickening within the 2nd and 3rd portions of the duodenum with mild surrounding inflammation suggesting duodenitis. Vascular/Lymphatic: Aortic atherosclerosis. No enlarged abdominal or pelvic lymph nodes. Reproductive: Mild prominence of the prostate with central calcifications. Other: No free fluid or free air. Musculoskeletal: Moderate compression deformity at L1 is stable. Degenerative changes throughout the lumbar spine. IMPRESSION: Large conglomerate medial right upper lobe mass invading the mediastinum  and right hilum again noted, unchanged. Separate 2.6 cm right upper lobe/apical rounded mass. This may have decreased slightly when compared to prior study. Small pericardial effusion and right pleural effusion, both slightly increased since prior study. Diffuse coronary artery disease, aortic atherosclerosis. 4.3 cm ascending thoracic aortic aneurysm. Recommend annual imaging followup by CTA or MRA. This recommendation follows 2010 ACCF/AHA/AATS/ACR/ASA/SCA/SCAI/SIR/STS/SVM Guidelines for the Diagnosis and Management of Patients with Thoracic Aortic Disease. Circulation. 2010; 121: X937-J696 Mild wall thickening and haziness around the 2nd and 3rd portions of the duodenum suggesting duodenitis. Abdominal aortic atherosclerosis. Prominent prostate with central calcifications. Scattered colonic diverticula.  No active diverticulitis. Electronically Signed   By: Rolm Baptise M.D.   On: 12/16/2017 22:29   Ct Abdomen Pelvis W Contrast  Result Date: 12/16/2017 CLINICAL DATA:  Lung cancer. Nausea, hemoptysis, cough. Shortness of breath. EXAM: CT CHEST, ABDOMEN, AND PELVIS WITH CONTRAST TECHNIQUE: Multidetector CT imaging of the chest, abdomen and pelvis was performed following the standard protocol during bolus administration of intravenous contrast. CONTRAST:  123m ISOVUE-300 IOPAMIDOL (ISOVUE-300) INJECTION 61% COMPARISON:  PET CT 11/26/2017 FINDINGS: CT CHEST FINDINGS Cardiovascular: Tortuosity of the thoracic aorta with scattered aortic calcifications. Mild aneurysmal dilatation of the ascending thoracic aorta, 4.3 cm maximally. Heart is normal size. Small pericardial effusion, slightly increased since prior PET CT. Mediastinum/Nodes: Right paratracheal/mediastinal conglomerate mass (likely arising from the medial right upper lobe) again noted which extends into the right hilum and subcarinal region, unchanged. Lungs/Pleura: Right upper lobe mass measures up to 2.6 cm, likely slightly decreased in size. Medial  right upper lobe mass invading the mediastinum and right hilum again noted, unchanged. Small right pleural effusion, increased since prior study. No confluent opacity or effusion on the left. Musculoskeletal: Degenerative changes throughout the thoracic spine. No acute bony abnormality. CT ABDOMEN PELVIS FINDINGS Hepatobiliary: No focal hepatic abnormality. Gallbladder unremarkable. Pancreas: No focal abnormality or ductal dilatation. Spleen: No focal abnormality.  Normal size. Adrenals/Urinary Tract: No adrenal abnormality. No focal renal abnormality. No stones or hydronephrosis. Urinary bladder is unremarkable. Stomach/Bowel: Scattered colonic diverticulosis. No active diverticulitis. No evidence of bowel obstruction. There is wall thickening within the  2nd and 3rd portions of the duodenum with mild surrounding inflammation suggesting duodenitis. Vascular/Lymphatic: Aortic atherosclerosis. No enlarged abdominal or pelvic lymph nodes. Reproductive: Mild prominence of the prostate with central calcifications. Other: No free fluid or free air. Musculoskeletal: Moderate compression deformity at L1 is stable. Degenerative changes throughout the lumbar spine. IMPRESSION: Large conglomerate medial right upper lobe mass invading the mediastinum and right hilum again noted, unchanged. Separate 2.6 cm right upper lobe/apical rounded mass. This may have decreased slightly when compared to prior study. Small pericardial effusion and right pleural effusion, both slightly increased since prior study. Diffuse coronary artery disease, aortic atherosclerosis. 4.3 cm ascending thoracic aortic aneurysm. Recommend annual imaging followup by CTA or MRA. This recommendation follows 2010 ACCF/AHA/AATS/ACR/ASA/SCA/SCAI/SIR/STS/SVM Guidelines for the Diagnosis and Management of Patients with Thoracic Aortic Disease. Circulation. 2010; 121: M355-H741 Mild wall thickening and haziness around the 2nd and 3rd portions of the duodenum  suggesting duodenitis. Abdominal aortic atherosclerosis. Prominent prostate with central calcifications. Scattered colonic diverticula.  No active diverticulitis. Electronically Signed   By: Rolm Baptise M.D.   On: 12/16/2017 22:29   Dg Chest Port 1 View  Result Date: 12/16/2017 CLINICAL DATA:  Lung cancer, cough, nausea EXAM: PORTABLE CHEST 1 VIEW COMPARISON:  11/07/2017 FINDINGS: Right mediastinal adenopathy again visualized as described on prior PET CT. Heart is mildly enlarged. No confluent airspace opacities or effusions. Previously seen right upper lobe mass on PET CT difficult to visualize by plain film. IMPRESSION: Right mediastinal adenopathy again noted, unchanged. Mild cardiomegaly. Electronically Signed   By: Rolm Baptise M.D.   On: 12/16/2017 20:57    Procedures Procedures (including critical care time)  Medications Ordered in ED Medications  ALPRAZolam (XANAX) tablet 0.5 mg (0.5 mg Oral Given 12/17/17 0407)  gabapentin (NEURONTIN) capsule 200 mg (200 mg Oral Given 12/17/17 1016)  acetaminophen (TYLENOL) tablet 650 mg (has no administration in time range)    Or  acetaminophen (TYLENOL) suppository 650 mg (has no administration in time range)  ondansetron (ZOFRAN) tablet 4 mg (has no administration in time range)    Or  ondansetron (ZOFRAN) injection 4 mg (has no administration in time range)  0.9 %  sodium chloride infusion ( Intravenous New Bag/Given 12/17/17 0421)  morphine 2 MG/ML injection 1 mg (has no administration in time range)  hydrALAZINE (APRESOLINE) injection 10 mg (has no administration in time range)  pantoprazole (PROTONIX) injection 40 mg (40 mg Intravenous Given 12/17/17 1016)  insulin aspart (novoLOG) injection 0-9 Units (0 Units Subcutaneous Not Given 12/17/17 0729)  umeclidinium bromide (INCRUSE ELLIPTA) 62.5 MCG/INH 1 puff (1 puff Inhalation Given 12/17/17 0843)  latanoprost (XALATAN) 0.005 % ophthalmic solution 1 drop (has no administration in time range)    potassium chloride SA (K-DUR,KLOR-CON) CR tablet 40 mEq (40 mEq Oral Given 12/17/17 0843)  lisinopril (PRINIVIL,ZESTRIL) tablet 10 mg (has no administration in time range)  sodium chloride 0.9 % bolus 1,000 mL (0 mLs Intravenous Stopped 12/16/17 2210)  ondansetron (ZOFRAN) injection 4 mg (4 mg Intravenous Given 12/16/17 2048)  potassium chloride 10 mEq in 100 mL IVPB (0 mEq Intravenous Stopped 12/16/17 2257)  iopamidol (ISOVUE-300) 61 % injection 100 mL (100 mLs Intravenous Contrast Given 12/16/17 2150)  famotidine (PEPCID) IVPB 20 mg premix (0 mg Intravenous Stopped 12/16/17 2335)  pantoprazole (PROTONIX) EC tablet 40 mg (40 mg Oral Given 12/16/17 2301)  metoCLOPramide (REGLAN) injection 10 mg (10 mg Intravenous Given 12/16/17 2300)     Initial Impression / Assessment and Plan / ED Course  I have reviewed the triage vital signs and the nursing notes.  Pertinent labs & imaging results that were available during my care of the patient were reviewed by me and considered in my medical decision making (see chart for details).  83 year old male presents with worsening generalized weakness, intractable N/V, and abdominal pain over the past couple days. He states it's been going on a month although I do not see this documented by providers over the past month. It seems to be much more prominent since chemoradiation. He has failed several outpatient meds. Vitals are normal here. Heart is regular rate and rhythm. Lungs are CTA. Abdomen is soft and he has lower abdominal tenderness with some voluntary guarding. CBC is remarkable for significant leukocytosis of 23.8. It's actually slightly lower than yesterday when he had labs drawn at the Cancer center (was 27.5). I do not see that he received Neulasta so unclear why he has such a marked leukocytosis. He does not have fevers. CMP is remarkable for mild hyponatremia and hypokalemia (3.2). Will give fluids, K, Zofran and obtain CT abdomen/pelvis and CXR because of  cough.  Shared visit with Dr. Darl Householder, he would like CT chest added on as well.  CT abdomen/pelvis and chest are overall reassuring. He has some mild duodenitis. He was given after antiemetics and fluids he is still nauseous. He was given Pepcid and Protonix for possible PUD. Discussed with Dr. Hal Hope with [facility] who will admit.  Final Clinical Impressions(s) / ED Diagnoses   Final diagnoses:  Intractable vomiting with nausea, unspecified vomiting type  Leukocytosis, unspecified type  Duodenitis  Malignant neoplasm of hilus of lung, unspecified laterality [facility])    ED Discharge Orders    None       Recardo Evangelist, PA-C 12/17/17 1132    Drenda Freeze, MD 12/17/17 [phone]

## 2017-12-17 ENCOUNTER — Other Ambulatory Visit: Payer: Medicare Other

## 2017-12-17 ENCOUNTER — Other Ambulatory Visit: Payer: Self-pay

## 2017-12-17 ENCOUNTER — Other Ambulatory Visit: Payer: Self-pay | Admitting: Radiology

## 2017-12-17 DIAGNOSIS — R112 Nausea with vomiting, unspecified: Secondary | ICD-10-CM | POA: Diagnosis present

## 2017-12-17 DIAGNOSIS — K2980 Duodenitis without bleeding: Secondary | ICD-10-CM | POA: Insufficient documentation

## 2017-12-17 LAB — HEPATIC FUNCTION PANEL
ALT: 17 U/L (ref 0–44)
AST: 14 U/L — ABNORMAL LOW (ref 15–41)
Albumin: 3.5 g/dL (ref 3.5–5.0)
Alkaline Phosphatase: 49 U/L (ref 38–126)
BILIRUBIN DIRECT: 0.3 mg/dL — AB (ref 0.0–0.2)
Indirect Bilirubin: 0.8 mg/dL (ref 0.3–0.9)
Total Bilirubin: 1.1 mg/dL (ref 0.3–1.2)
Total Protein: 6 g/dL — ABNORMAL LOW (ref 6.5–8.1)

## 2017-12-17 LAB — CBG MONITORING, ED
Glucose-Capillary: 93 mg/dL (ref 70–99)
Glucose-Capillary: 140 mg/dL — ABNORMAL HIGH (ref 70–99)

## 2017-12-17 LAB — CBC WITH DIFFERENTIAL/PLATELET
Abs Immature Granulocytes: 0.4 10*3/uL — ABNORMAL HIGH (ref 0.00–0.07)
Basophils Absolute: 0 10*3/uL (ref 0.0–0.1)
Basophils Relative: 0 %
EOS ABS: 0 10*3/uL (ref 0.0–0.5)
Eosinophils Relative: 0 %
HCT: 38.8 % — ABNORMAL LOW (ref 39.0–52.0)
Hemoglobin: 12.4 g/dL — ABNORMAL LOW (ref 13.0–17.0)
Lymphocytes Relative: 14 %
Lymphs Abs: 2.6 10*3/uL (ref 0.7–4.0)
MCH: 28.1 pg (ref 26.0–34.0)
MCHC: 32 g/dL (ref 30.0–36.0)
MCV: 87.8 fL (ref 80.0–100.0)
Monocytes Absolute: 0.5 10*3/uL (ref 0.1–1.0)
Monocytes Relative: 3 %
Myelocytes: 2 %
NEUTROS PCT: 81 %
Neutro Abs: 14.8 10*3/uL — ABNORMAL HIGH (ref 1.7–7.7)
Platelets: 104 10*3/uL — ABNORMAL LOW (ref 150–400)
RBC: 4.42 MIL/uL (ref 4.22–5.81)
RDW: 13.5 % (ref 11.5–15.5)
WBC: 18.3 10*3/uL — ABNORMAL HIGH (ref 4.0–10.5)
nRBC: 0 % (ref 0.0–0.2)

## 2017-12-17 LAB — BASIC METABOLIC PANEL
Anion gap: 9 (ref 5–15)
BUN: 13 mg/dL (ref 8–23)
CO2: 22 mmol/L (ref 22–32)
Calcium: 8.3 mg/dL — ABNORMAL LOW (ref 8.9–10.3)
Chloride: 104 mmol/L (ref 98–111)
Creatinine, Ser: 0.66 mg/dL (ref 0.61–1.24)
GFR calc Af Amer: >60 mL/min (ref >60)
GFR calc non Af Amer: >60 mL/min (ref >60)
Glucose, Bld: 118 mg/dL — ABNORMAL HIGH (ref 70–99)
Potassium: 3.2 mmol/L — ABNORMAL LOW (ref 3.5–5.1)
Sodium: 135 mmol/L (ref 135–145)

## 2017-12-17 LAB — GLUCOSE, CAPILLARY
GLUCOSE-CAPILLARY: 103 mg/dL — AB (ref 70–99)
Glucose-Capillary: 121 mg/dL — ABNORMAL HIGH (ref 70–99)

## 2017-12-17 MED ORDER — ONDANSETRON HCL 4 MG PO TABS: 4.0000 mg | ORAL_TABLET | Freq: Four times a day (QID) | ORAL | Status: DC | PRN

## 2017-12-17 MED ORDER — ONDANSETRON HCL 4 MG/2ML IJ SOLN
4.0000 mg | Freq: Four times a day (QID) | INTRAMUSCULAR | Status: DC | PRN
  Administered 2017-12-17 – 2017-12-19 (×3): 4 mg via INTRAVENOUS
  Filled 2017-12-17 (×3): qty 2

## 2017-12-17 MED ORDER — POTASSIUM CHLORIDE CRYS ER 20 MEQ PO TBCR
40.0000 meq | EXTENDED_RELEASE_TABLET | ORAL | Status: AC
  Administered 2017-12-17 (×2): 40 meq via ORAL
  Filled 2017-12-17 (×2): qty 2

## 2017-12-17 MED ORDER — TRAVOPROST 0.004 % OP SOLN: 1.0000 [drp] | Freq: Every day | OPHTHALMIC | Status: DC

## 2017-12-17 MED ORDER — ALPRAZOLAM 0.5 MG PO TABS
0.5000 mg | ORAL_TABLET | Freq: Every evening | ORAL | Status: DC | PRN
  Administered 2017-12-17 – 2017-12-18 (×3): 0.5 mg via ORAL
  Filled 2017-12-17 (×3): qty 1

## 2017-12-17 MED ORDER — CYCLOBENZAPRINE HCL 10 MG PO TABS
10.0000 mg | ORAL_TABLET | Freq: Once | ORAL | Status: AC
  Administered 2017-12-17: 10 mg via ORAL
  Filled 2017-12-17: qty 1

## 2017-12-17 MED ORDER — HYDRALAZINE HCL 20 MG/ML IJ SOLN: 10.0000 mg | INTRAMUSCULAR | Status: DC | PRN

## 2017-12-17 MED ORDER — TIOTROPIUM BROMIDE MONOHYDRATE 2.5 MCG/ACT IN AERS: 2.0000 | INHALATION_SPRAY | Freq: Every day | RESPIRATORY_TRACT | Status: DC

## 2017-12-17 MED ORDER — ACETAMINOPHEN 650 MG RE SUPP: 650.0000 mg | Freq: Four times a day (QID) | RECTAL | Status: DC | PRN

## 2017-12-17 MED ORDER — ACETAMINOPHEN 325 MG PO TABS: 650.0000 mg | ORAL_TABLET | Freq: Four times a day (QID) | ORAL | Status: DC | PRN

## 2017-12-17 MED ORDER — LATANOPROST 0.005 % OP SOLN
1.0000 [drp] | Freq: Every day | OPHTHALMIC | Status: DC
  Administered 2017-12-17 – 2017-12-18 (×2): 1 [drp] via OPHTHALMIC
  Filled 2017-12-17: qty 2.5

## 2017-12-17 MED ORDER — ENSURE ENLIVE PO LIQD
237.0000 mL | Freq: Two times a day (BID) | ORAL | Status: DC
  Administered 2017-12-18: 237 mL via ORAL

## 2017-12-17 MED ORDER — GABAPENTIN 100 MG PO CAPS
200.0000 mg | ORAL_CAPSULE | Freq: Three times a day (TID) | ORAL | Status: DC
  Administered 2017-12-17 – 2017-12-19 (×8): 200 mg via ORAL
  Filled 2017-12-17 (×8): qty 2

## 2017-12-17 MED ORDER — SODIUM CHLORIDE 0.9 % IV SOLN
INTRAVENOUS | Status: AC
  Administered 2017-12-17 – 2017-12-18 (×3): via INTRAVENOUS

## 2017-12-17 MED ORDER — MORPHINE SULFATE (PF) 2 MG/ML IV SOLN: 1.0000 mg | INTRAVENOUS | Status: DC | PRN

## 2017-12-17 MED ORDER — OXYCODONE-ACETAMINOPHEN 5-325 MG PO TABS
1.0000 | ORAL_TABLET | Freq: Once | ORAL | Status: AC
  Administered 2017-12-17: 1 via ORAL
  Filled 2017-12-17: qty 1

## 2017-12-17 MED ORDER — UMECLIDINIUM BROMIDE 62.5 MCG/INH IN AEPB
1.0000 | INHALATION_SPRAY | Freq: Every day | RESPIRATORY_TRACT | Status: DC
  Administered 2017-12-17 – 2017-12-19 (×3): 1 via RESPIRATORY_TRACT
  Filled 2017-12-17: qty 7

## 2017-12-17 MED ORDER — LISINOPRIL 10 MG PO TABS
10.0000 mg | ORAL_TABLET | Freq: Every day | ORAL | Status: DC
  Administered 2017-12-17 – 2017-12-19 (×3): 10 mg via ORAL
  Filled 2017-12-17 (×3): qty 1

## 2017-12-17 MED ORDER — PANTOPRAZOLE SODIUM 40 MG IV SOLR
40.0000 mg | Freq: Two times a day (BID) | INTRAVENOUS | Status: DC
  Administered 2017-12-17 – 2017-12-19 (×6): 40 mg via INTRAVENOUS
  Filled 2017-12-17 (×6): qty 40

## 2017-12-17 MED ORDER — INSULIN ASPART 100 UNIT/ML ~~LOC~~ SOLN
0.0000 [IU] | SUBCUTANEOUS | Status: DC
  Administered 2017-12-17 – 2017-12-18 (×3): 1 [IU] via SUBCUTANEOUS
  Filled 2017-12-17: qty 1

## 2017-12-17 NOTE — ED Notes (Addendum)
Paged Elodia Florence., MD about patient home medications. Patient concerned he is not receiving all of his home medications. Specifically for BP. At 10:00 BP-123/89.

## 2017-12-17 NOTE — ED Notes (Signed)
Spoke to [place]. He will order medication.

## 2017-12-17 NOTE — Telephone Encounter (Signed)
Consider switch to coumadin on next OV

## 2017-12-17 NOTE — ED Notes (Signed)
ED TO INPATIENT HANDOFF REPORT  Name/Age/Gender Scott Gomez 83 y.o. male  Code Status    Code Status Orders  (From admission, onward)         Start     Ordered   12/17/17 0303  Full code  Continuous     12/17/17 0304        Code Status History    This patient has a current code status but no historical code status.      Home/SNF/Other Home  Chief Complaint Dehydrated/Oncology Patient  Level of Care/Admitting Diagnosis ED Disposition    ED Disposition Condition Comment   Admit  Hospital Area: [facility] [[id number]]  Level of Care: Telemetry [5]  Admit to tele based on following criteria: Monitor for Ischemic changes  Diagnosis: Nausea & vomiting [[id number]]  Admitting Physician: Rise Patience [phone]  Attending Physician: Rise Patience [phone]  PT Class (Do Not Modify): Observation [104]  PT Acc Code (Do Not Modify): Observation [10022]       Medical History Past Medical History:  Diagnosis Date  . Anxiety   . Arthritis    osteoarthritis both hands  . Carpal tunnel syndrome   . Diabetes mellitus    type II  . Fatigue   . Hypercholesteremia   . Hypopotassemia   . Leg cramps   . Nephrolithiasis    Hx of  . Palpitations    Hx of   . Shoulder pain, left   . Snoring   . Testosterone deficiency   . Ulcer    peptic ulcer disease    Allergies Allergies  Allergen Reactions  . Buspirone Hcl     REACTION: itching  . Crestor [Rosuvastatin Calcium]     Increased his blood sugar   . Prednisone     REACTION: sick, per wife he can take this.    IV Location/Drains/Wounds Patient Lines/Drains/Airways Status   Active Line/Drains/Airways    Name:   Placement date:   Placement time:   Site:   Days:   Peripheral IV 12/16/17 Left Antecubital   12/16/17    2046    Antecubital   1          Labs/Imaging Results for orders placed or performed during the hospital encounter of 12/16/17 (from the past 48 hour(s))   Urinalysis, Routine w reflex microscopic     Status: Abnormal   Collection Time: 12/16/17  5:12 PM  Result Value Ref Range   Color, Urine YELLOW YELLOW   APPearance CLEAR CLEAR   Specific Gravity, Urine 1.013 1.005 - 1.030   pH 6.0 5.0 - 8.0   Glucose, UA 50 (A) NEGATIVE mg/dL   Hgb urine dipstick NEGATIVE NEGATIVE   Bilirubin Urine NEGATIVE NEGATIVE   Ketones, ur 20 (A) NEGATIVE mg/dL   Protein, ur 30 (A) NEGATIVE mg/dL   Nitrite NEGATIVE NEGATIVE   Leukocytes, UA NEGATIVE NEGATIVE   RBC / HPF 0-5 0 - 5 RBC/hpf   WBC, UA 0-5 0 - 5 WBC/hpf   Bacteria, UA NONE SEEN NONE SEEN   Mucus PRESENT    Hyaline Casts, UA PRESENT     Comment: Performed at [facility], [place] [address], [place], [place] [address]  Lipase, blood     Status: None   Collection Time: 12/16/17  6:05 PM  Result Value Ref Range   Lipase 35 11 - 51 U/L    Comment: Performed at [facility], [address] [place], [place]  [address]  Comprehensive metabolic panel     Status: Abnormal   Collection Time: 12/16/17  6:05 PM  Result Value Ref Range   Sodium 132 (L) 135 - 145 mmol/L   Potassium 3.2 (L) 3.5 - 5.1 mmol/L   Chloride 102 98 - 111 mmol/L   CO2 22 22 - 32 mmol/L   Glucose, Bld 133 (H) 70 - 99 mg/dL   BUN 14 8 - 23 mg/dL   Creatinine, Ser 0.78 0.61 - 1.24 mg/dL   Calcium 8.9 8.9 - 10.3 mg/dL   Total Protein 6.5 6.5 - 8.1 g/dL   Albumin 4.0 3.5 - 5.0 g/dL   AST 16 15 - 41 U/L   ALT 21 0 - 44 U/L   Alkaline Phosphatase 57 38 - 126 U/L   Total Bilirubin 1.2 0.3 - 1.2 mg/dL   GFR calc non Af Amer >60 >60 mL/min   GFR calc Af Amer >60 >60 mL/min   Anion gap 8 5 - 15    Comment: Performed at [facility], [place] [address], [place], [place] [address]  CBC     Status: Abnormal   Collection Time: 12/16/17  6:05 PM  Result Value Ref Range   WBC 23.8 (H) 4.0 - 10.5 K/uL   RBC 4.67 4.22 - 5.81 MIL/uL   Hemoglobin 13.3 13.0 - 17.0 g/dL   HCT 40.1 39.0 -  52.0 %   MCV 85.9 80.0 - 100.0 fL   MCH 28.5 26.0 - 34.0 pg   MCHC 33.2 30.0 - 36.0 g/dL   RDW 13.5 11.5 - 15.5 %   Platelets 124 (L) 150 - 400 K/uL   nRBC 0.0 0.0 - 0.2 %    Comment: Performed at [facility], [place] [address], [place], [place] [address]  Basic metabolic panel     Status: Abnormal   Collection Time: 12/17/17  4:10 AM  Result Value Ref Range   Sodium 135 135 - 145 mmol/L   Potassium 3.2 (L) 3.5 - 5.1 mmol/L   Chloride 104 98 - 111 mmol/L   CO2 22 22 - 32 mmol/L   Glucose, Bld 118 (H) 70 - 99 mg/dL   BUN 13 8 - 23 mg/dL   Creatinine, Ser 0.66 0.61 - 1.24 mg/dL   Calcium 8.3 (L) 8.9 - 10.3 mg/dL   GFR calc non Af Amer >60 >60 mL/min   GFR calc Af Amer >60 >60 mL/min   Anion gap 9 5 - 15    Comment: Performed at [facility], [place] [address] [place], [place] [address]  Hepatic function panel     Status: Abnormal   Collection Time: 12/17/17  4:10 AM  Result Value Ref Range   Total Protein 6.0 (L) 6.5 - 8.1 g/dL   Albumin 3.5 3.5 - 5.0 g/dL   AST 14 (L) 15 - 41 U/L   ALT 17 0 - 44 U/L   Alkaline Phosphatase 49 38 - 126 U/L   Total Bilirubin 1.1 0.3 - 1.2 mg/dL   Bilirubin, Direct 0.3 (H) 0.0 - 0.2 mg/dL   Indirect Bilirubin 0.8 0.3 - 0.9 mg/dL    Comment: Performed at [facility], [place] [address], [place],  [address]  CBC WITH DIFFERENTIAL     Status: Abnormal   Collection Time: 12/17/17  4:10 AM  Result Value Ref Range   WBC 18.3 (H) 4.0 - 10.5 K/uL   RBC 4.42 4.22 - 5.81 MIL/uL   Hemoglobin  12.4 (L) 13.0 - 17.0 g/dL   HCT 38.8 (L) 39.0 - 52.0 %   MCV 87.8 80.0 - 100.0 fL   MCH 28.1 26.0 - 34.0 pg   MCHC 32.0 30.0 - 36.0 g/dL   RDW 13.5 11.5 - 15.5 %   Platelets 104 (L) 150 - 400 K/uL    Comment: REPEATED TO VERIFY PLATELET COUNT CONFIRMED BY SMEAR SPECIMEN CHECKED FOR CLOTS Immature Platelet Fraction may be clinically indicated, consider ordering this additional test [id number]    nRBC 0.0  0.0 - 0.2 %   Neutrophils Relative % 81 %   Neutro Abs 14.8 (H) 1.7 - 7.7 K/uL   Lymphocytes Relative 14 %   Lymphs Abs 2.6 0.7 - 4.0 K/uL   Monocytes Relative 3 %   Monocytes Absolute 0.5 0.1 - 1.0 K/uL   Eosinophils Relative 0 %   Eosinophils Absolute 0.0 0.0 - 0.5 K/uL   Basophils Relative 0 %   Basophils Absolute 0.0 0.0 - 0.1 K/uL   WBC Morphology MILD LEFT SHIFT (1-5% METAS, OCC MYELO, OCC BANDS)    Myelocytes 2 %   Abs Immature Granulocytes 0.40 (H) 0.00 - 0.07 K/uL   Dohle Bodies PRESENT     Comment: Performed at [facility], [place] [address] [place], [place] [address]  CBG monitoring, ED     Status: None   Collection Time: 12/17/17  6:22 AM  Result Value Ref Range   Glucose-Capillary 93 70 - 99 mg/dL  CBG monitoring, ED     Status: Abnormal   Collection Time: 12/17/17 12:06 PM  Result Value Ref Range   Glucose-Capillary 140 (H) 70 - 99 mg/dL   Ct Chest W Contrast  Result Date: 12/16/2017 CLINICAL DATA:  Lung cancer. Nausea, hemoptysis, cough. Shortness of breath. EXAM: CT CHEST, ABDOMEN, AND PELVIS WITH CONTRAST TECHNIQUE: Multidetector CT imaging of the chest, abdomen and pelvis was performed following the standard protocol during bolus administration of intravenous contrast. CONTRAST:  113mL ISOVUE-300 IOPAMIDOL (ISOVUE-300) INJECTION 61% COMPARISON:  PET CT 11/26/2017 FINDINGS: CT CHEST FINDINGS Cardiovascular: Tortuosity of the thoracic aorta with scattered aortic calcifications. Mild aneurysmal dilatation of the ascending thoracic aorta, 4.3 cm maximally. Heart is normal size. Small pericardial effusion, slightly increased since prior PET CT. Mediastinum/Nodes: Right paratracheal/mediastinal conglomerate mass (likely arising from the medial right upper lobe) again noted which extends into the right hilum and subcarinal region, unchanged. Lungs/Pleura: Right upper lobe mass measures up to 2.6 cm, likely slightly decreased in size. Medial right upper lobe  mass invading the mediastinum and right hilum again noted, unchanged. Small right pleural effusion, increased since prior study. No confluent opacity or effusion on the left. Musculoskeletal: Degenerative changes throughout the thoracic spine. No acute bony abnormality. CT ABDOMEN PELVIS FINDINGS Hepatobiliary: No focal hepatic abnormality. Gallbladder unremarkable. Pancreas: No focal abnormality or ductal dilatation. Spleen: No focal abnormality.  Normal size. Adrenals/Urinary Tract: No adrenal abnormality. No focal renal abnormality. No stones or hydronephrosis. Urinary bladder is unremarkable. Stomach/Bowel: Scattered colonic diverticulosis. No active diverticulitis. No evidence of bowel obstruction. There is wall thickening within the 2nd and 3rd portions of the duodenum with mild surrounding inflammation suggesting duodenitis. Vascular/Lymphatic: Aortic atherosclerosis. No enlarged abdominal or pelvic lymph nodes. Reproductive: Mild prominence of the prostate with central calcifications. Other: No free fluid or free air. Musculoskeletal: Moderate compression deformity at L1 is stable. Degenerative changes throughout the lumbar spine. IMPRESSION: Large conglomerate medial right upper lobe mass invading the mediastinum and right hilum again noted,  unchanged. Separate 2.6 cm right upper lobe/apical rounded mass. This may have decreased slightly when compared to prior study. Small pericardial effusion and right pleural effusion, both slightly increased since prior study. Diffuse coronary artery disease, aortic atherosclerosis. 4.3 cm ascending thoracic aortic aneurysm. Recommend annual imaging followup by CTA or MRA. This recommendation follows 2010 ACCF/AHA/AATS/ACR/ASA/SCA/SCAI/SIR/STS/SVM Guidelines for the Diagnosis and Management of Patients with Thoracic Aortic Disease. Circulation. 2010; 121: K093-G182 Mild wall thickening and haziness around the 2nd and 3rd portions of the duodenum suggesting duodenitis.  Abdominal aortic atherosclerosis. Prominent prostate with central calcifications. Scattered colonic diverticula.  No active diverticulitis. Electronically Signed   By: Rolm Baptise M.D.   On: 12/16/2017 22:29   Ct Abdomen Pelvis W Contrast  Result Date: 12/16/2017 CLINICAL DATA:  Lung cancer. Nausea, hemoptysis, cough. Shortness of breath. EXAM: CT CHEST, ABDOMEN, AND PELVIS WITH CONTRAST TECHNIQUE: Multidetector CT imaging of the chest, abdomen and pelvis was performed following the standard protocol during bolus administration of intravenous contrast. CONTRAST:  174mL ISOVUE-300 IOPAMIDOL (ISOVUE-300) INJECTION 61% COMPARISON:  PET CT 11/26/2017 FINDINGS: CT CHEST FINDINGS Cardiovascular: Tortuosity of the thoracic aorta with scattered aortic calcifications. Mild aneurysmal dilatation of the ascending thoracic aorta, 4.3 cm maximally. Heart is normal size. Small pericardial effusion, slightly increased since prior PET CT. Mediastinum/Nodes: Right paratracheal/mediastinal conglomerate mass (likely arising from the medial right upper lobe) again noted which extends into the right hilum and subcarinal region, unchanged. Lungs/Pleura: Right upper lobe mass measures up to 2.6 cm, likely slightly decreased in size. Medial right upper lobe mass invading the mediastinum and right hilum again noted, unchanged. Small right pleural effusion, increased since prior study. No confluent opacity or effusion on the left. Musculoskeletal: Degenerative changes throughout the thoracic spine. No acute bony abnormality. CT ABDOMEN PELVIS FINDINGS Hepatobiliary: No focal hepatic abnormality. Gallbladder unremarkable. Pancreas: No focal abnormality or ductal dilatation. Spleen: No focal abnormality.  Normal size. Adrenals/Urinary Tract: No adrenal abnormality. No focal renal abnormality. No stones or hydronephrosis. Urinary bladder is unremarkable. Stomach/Bowel: Scattered colonic diverticulosis. No active diverticulitis. No evidence  of bowel obstruction. There is wall thickening within the 2nd and 3rd portions of the duodenum with mild surrounding inflammation suggesting duodenitis. Vascular/Lymphatic: Aortic atherosclerosis. No enlarged abdominal or pelvic lymph nodes. Reproductive: Mild prominence of the prostate with central calcifications. Other: No free fluid or free air. Musculoskeletal: Moderate compression deformity at L1 is stable. Degenerative changes throughout the lumbar spine. IMPRESSION: Large conglomerate medial right upper lobe mass invading the mediastinum and right hilum again noted, unchanged. Separate 2.6 cm right upper lobe/apical rounded mass. This may have decreased slightly when compared to prior study. Small pericardial effusion and right pleural effusion, both slightly increased since prior study. Diffuse coronary artery disease, aortic atherosclerosis. 4.3 cm ascending thoracic aortic aneurysm. Recommend annual imaging followup by CTA or MRA. This recommendation follows 2010 ACCF/AHA/AATS/ACR/ASA/SCA/SCAI/SIR/STS/SVM Guidelines for the Diagnosis and Management of Patients with Thoracic Aortic Disease. Circulation. 2010; 121: X937-J696 Mild wall thickening and haziness around the 2nd and 3rd portions of the duodenum suggesting duodenitis. Abdominal aortic atherosclerosis. Prominent prostate with central calcifications. Scattered colonic diverticula.  No active diverticulitis. Electronically Signed   By: Rolm Baptise M.D.   On: 12/16/2017 22:29   Dg Chest Port 1 View  Result Date: 12/16/2017 CLINICAL DATA:  Lung cancer, cough, nausea EXAM: PORTABLE CHEST 1 VIEW COMPARISON:  11/07/2017 FINDINGS: Right mediastinal adenopathy again visualized as described on prior PET CT. Heart is mildly enlarged. No confluent airspace opacities or effusions. Previously  seen right upper lobe mass on PET CT difficult to visualize by plain film. IMPRESSION: Right mediastinal adenopathy again noted, unchanged. Mild cardiomegaly.  Electronically Signed   By: Rolm Baptise M.D.   On: 12/16/2017 20:57   None  Pending Labs Unresulted Labs (From admission, onward)    Start     Ordered   12/18/17 0500  CBC  Tomorrow morning,   R     12/17/17 0850   12/18/17 0500  Comprehensive metabolic panel  Tomorrow morning,   R     12/17/17 0850   12/18/17 0500  Magnesium  Tomorrow morning,   R     12/17/17 0850   12/17/17 0304  Culture, blood (routine x 2)  BLOOD CULTURE X 2,   R     12/17/17 0304          Vitals/Pain Today's Vitals   12/17/17 0930 12/17/17 1000 12/17/17 1130 12/17/17 1200  BP: 115/85 123/89 131/83 112/69  Pulse: 81 74 81   Resp: 13 17 (!) 21 19  Temp:      TempSrc:      SpO2: 100% 100% 97%   Weight:      Height:      PainSc:        Isolation Precautions No active isolations  Medications Medications  ALPRAZolam (XANAX) tablet 0.5 mg (0.5 mg Oral Given 12/17/17 0407)  gabapentin (NEURONTIN) capsule 200 mg (200 mg Oral Given 12/17/17 1016)  acetaminophen (TYLENOL) tablet 650 mg (has no administration in time range)    Or  acetaminophen (TYLENOL) suppository 650 mg (has no administration in time range)  ondansetron (ZOFRAN) tablet 4 mg (has no administration in time range)    Or  ondansetron (ZOFRAN) injection 4 mg (has no administration in time range)  0.9 %  sodium chloride infusion ( Intravenous New Bag/Given 12/17/17 0421)  morphine 2 MG/ML injection 1 mg (has no administration in time range)  hydrALAZINE (APRESOLINE) injection 10 mg (has no administration in time range)  pantoprazole (PROTONIX) injection 40 mg (40 mg Intravenous Given 12/17/17 1016)  insulin aspart (novoLOG) injection 0-9 Units (1 Units Subcutaneous Given 12/17/17 1223)  umeclidinium bromide (INCRUSE ELLIPTA) 62.5 MCG/INH 1 puff (1 puff Inhalation Given 12/17/17 0843)  latanoprost (XALATAN) 0.005 % ophthalmic solution 1 drop (has no administration in time range)  lisinopril (PRINIVIL,ZESTRIL) tablet 10 mg (10 mg Oral Given  12/17/17 1159)  sodium chloride 0.9 % bolus 1,000 mL (0 mLs Intravenous Stopped 12/16/17 2210)  ondansetron (ZOFRAN) injection 4 mg (4 mg Intravenous Given 12/16/17 2048)  potassium chloride 10 mEq in 100 mL IVPB (0 mEq Intravenous Stopped 12/16/17 2257)  iopamidol (ISOVUE-300) 61 % injection 100 mL (100 mLs Intravenous Contrast Given 12/16/17 2150)  famotidine (PEPCID) IVPB 20 mg premix (0 mg Intravenous Stopped 12/16/17 2335)  pantoprazole (PROTONIX) EC tablet 40 mg (40 mg Oral Given 12/16/17 2301)  metoCLOPramide (REGLAN) injection 10 mg (10 mg Intravenous Given 12/16/17 2300)  potassium chloride SA (K-DUR,KLOR-CON) CR tablet 40 mEq (40 mEq Oral Given 12/17/17 1224)    Mobility manual wheelchair

## 2017-12-17 NOTE — ED Notes (Addendum)
Pt taken to the restroom and switched into a hospital bed.

## 2017-12-17 NOTE — ED Notes (Addendum)
Attempted to call report to [facility] [phone]. RN upstairs will call back when ready.

## 2017-12-17 NOTE — ED Notes (Signed)
Patient has 2 visitors at bedside.

## 2017-12-17 NOTE — H&P (Signed)
History and Physical    Scott Gomez RKY:[id number] DOB: 1934-03-08 DOA: 12/16/2017  PCP: Abner Greenspan, MD  Patient coming from: Home.  Chief Complaint: Nausea vomiting and abdominal pain.  HPI: Scott Gomez is a 83 y.o. male with recently diagnosed limited stage small cell lung cancer had chemotherapy last week Thursday Friday and Saturday started developing periumbilical abdominal pain with nausea which has been progressively worsening.  Patient states he has had previous history of peptic ulcer disease many years ago.  Due to worsening abdominal pain patient came to the ER.  Denies any diarrhea.  Unable to eat well because of the pain.  He also was recently diagnosed with possible DVT for which patient was placed on apixaban.  Has not taken his apixaban yesterday because of the nausea and vomiting.  Patient states he supposed to be off apixaban from today for likely Port-A-Cath placement.  ED Course: In the ER patient had CT chest and abdomen and pelvis which shows duodenitis.  Patient is placed on Pepcid and Protonix admitted for further management.  On my exam patient's epigastric area is mildly tender.  Patient's labs show significant leukocytosis with no fever.  With CAT scan done shows no specific infiltrates.  Not sure if patient received any Neulasta after chemotherapy.  Review of Systems: As per HPI, rest all negative.   Past Medical History:  Diagnosis Date  . Anxiety   . Arthritis    osteoarthritis both hands  . Carpal tunnel syndrome   . Diabetes mellitus    type II  . Fatigue   . Hypercholesteremia   . Hypopotassemia   . Leg cramps   . Nephrolithiasis    Hx of  . Palpitations    Hx of   . Shoulder pain, left   . Snoring   . Testosterone deficiency   . Ulcer    peptic ulcer disease    Past Surgical History:  Procedure Laterality Date  . BACK SURGERY  1978   x2 disc     reports that he quit smoking about 24 years ago. His smoking use included  cigarettes. He has a 76.00 pack-year smoking history. He has never used smokeless tobacco. He reports that he does not drink alcohol or use drugs.  Allergies  Allergen Reactions  . Buspirone Hcl     REACTION: itching  . Crestor [Rosuvastatin Calcium]     Increased his blood sugar   . Prednisone     REACTION: sick, per wife he can take this.    Family History  Problem Relation Age of Onset  . Cancer Brother        kidney  . Heart disease Brother        MI  . Hypertension Mother   . Allergies Mother   . Hypertension Father   . Allergies Father   . Cancer Sister   . Emphysema Sister   . Heart attack Sister     Prior to Admission medications   Medication Sig Start Date End Date Taking? Authorizing Provider  albuterol (PROAIR HFA) 108 (90 Base) MCG/ACT inhaler Inhale 2 puffs into the lungs every 4 (four) hours as needed for wheezing or shortness of breath. 11/03/17  Yes Martyn Ehrich, NP  ALPRAZolam Duanne Moron) 0.5 MG tablet TAKE ONE (1) TABLET BY MOUTH TWO (2) TIMES DAILY AS NEEDED FOR ANXIETY Patient taking differently: Take 0.5 mg by mouth at bedtime as needed for sleep.  08/18/17  Yes [name], [name], MD  cyclobenzaprine (FLEXERIL) 10 MG tablet Take 10 mg by mouth at bedtime as needed for muscle spasms.  11/19/10  Yes [name], [name], MD  docusate sodium (COLACE) 100 MG capsule Take 100 mg by mouth daily.   Yes [provider]  gabapentin (NEURONTIN) 100 MG capsule Take 200 mg by mouth 3 (three) times daily.    Yes [provider]  levalbuterol (XOPENEX) 0.63 MG/3ML nebulizer solution Take 3 mLs (0.63 mg total) by nebulization every 4 (four) hours as needed for wheezing or shortness of breath. 11/07/17  Yes Martyn Ehrich, NP  lisinopril (PRINIVIL,ZESTRIL) 10 MG tablet Take 1 tablet (10 mg total) by mouth daily. 04/14/17  Yes [name], [name], MD  ondansetron (ZOFRAN) 8 MG tablet Take 1 tablet (8 mg total) by mouth every 8 (eight) hours as needed for nausea or  vomiting. 12/15/17  Yes Curt Bears, MD  OVER THE COUNTER MEDICATION Take 1 tablet by mouth daily. Vitamin b 12   Yes [provider]  Oxycodone HCl 10 MG TABS Take 1 tablet by mouth 2 (two) times daily as needed (pain).  10/28/12  Yes [provider]  potassium chloride SA (K-DUR,KLOR-CON) 20 MEQ tablet TAKE ONE (1) TABLET BY MOUTH EVERY DAY 04/14/17  Yes [name], [name], MD  prochlorperazine (COMPAZINE) 10 MG tablet Take 1 tablet (10 mg total) by mouth every 6 (six) hours as needed for nausea or vomiting. 12/04/17  Yes Curt Bears, MD  Tiotropium Bromide Monohydrate (SPIRIVA RESPIMAT) 2.5 MCG/ACT AERS Inhale 2 puffs into the lungs daily. 11/14/17  Yes Martyn Ehrich, NP  travoprost, benzalkonium, (TRAVATAN) 0.004 % ophthalmic solution Place 1 drop into both eyes at bedtime.     Yes [provider]  alendronate (FOSAMAX) 70 MG tablet Take 70 mg by mouth once a week. Take with a full glass of water on an empty stomach.    [provider]  apixaban (ELIQUIS) 5 MG TABS tablet Take 1 tablet (5 mg total) by mouth 2 (two) times daily. 12/16/17   Rigoberto Noel, MD  Blood Glucose Monitoring Suppl (ONETOUCH VERIO IQ SYSTEM) w/Device KIT Use to test blood sugar once daily and as needed dx R73.9 10/14/16   [name], Roque Lias A, MD  glucose blood (ONETOUCH VERIO) test strip USE TO CHECK BLOOD SUGAR ONCE DAILY 04/23/17   [name], [name], MD  LORazepam (ATIVAN) 1 MG tablet Take 1 tablet (1 mg total) by mouth every 8 (eight) hours. Pt is to take 1 tablet 30 minutes prior to MRI 12/15/17   Curt Bears, MD  magic mouthwash w/lidocaine SOLN Take 5 mLs by mouth 3 (three) times daily as needed for mouth pain (swish and spit). Patient not taking: Reported on 12/16/2017 11/18/17   [name], [name], MD  ONE TOUCH LANCETS MISC Use to test blood sugar once daily and as needed. Dx 790.29 05/06/13   Abner Greenspan, MD    Physical Exam: Vitals:   12/16/17 2133 12/16/17 2309 12/17/17 0000  12/17/17 0134  BP: (!) 123/92 137/88 (!) 144/86 128/87  Pulse: 95 86 89 92  Resp: '12 14 16 20  ' Temp:      TempSrc:    Oral  SpO2: 96% 96% 99% 96%  Weight:      Height:          Constitutional: Moderately built and nourished. Vitals:   12/16/17 2133 12/16/17 2309 12/17/17 0000 12/17/17 0134  BP: (!) 123/92 137/88 (!) 144/86 128/87  Pulse: 95 86  89 92  Resp: '12 14 16 20  ' Temp:      TempSrc:    Oral  SpO2: 96% 96% 99% 96%  Weight:      Height:       Eyes: Nonicteric no pallor. ENMT: No discharge from the ears eyes nose or mouth. Neck: No mass felt.  No neck rigidity. Respiratory: No rhonchi or crepitations. Cardiovascular: S1-S2 heard no murmurs appreciated. Abdomen: Mild epigastric tenderness no guarding.  No rebound tenderness bowel sounds present. Musculoskeletal: No edema. Skin: No rash. Neurologic: Alert awake oriented to time place and person.  Moves all extremities. Psychiatric: Appears normal.  Normal affect.   Labs on Admission: I have personally reviewed following labs and imaging studies  CBC: Recent Labs  Lab 12/15/17 0958 12/16/17 1805  WBC 27.5* 23.8*  NEUTROABS 24.4*  --   HGB 13.0 13.3  HCT 38.3* 40.1  MCV 83.8 85.9  PLT 158 031*   Basic Metabolic Panel: Recent Labs  Lab 12/15/17 0958 12/16/17 1805  NA 135 132*  K 4.0 3.2*  CL 104 102  CO2 22 22  GLUCOSE 122* 133*  BUN 15 14  CREATININE 0.88 0.78  CALCIUM 9.2 8.9  MG 1.8  --    GFR: Estimated Creatinine Clearance: 74.8 mL/min (by C-G formula based on SCr of 0.78 mg/dL). Liver Function Tests: Recent Labs  Lab 12/15/17 0958 12/16/17 1805  AST 18 16  ALT 21 21  ALKPHOS 51 57  BILITOT 0.8 1.2  PROT 6.5 6.5  ALBUMIN 3.8 4.0   Recent Labs  Lab 12/16/17 1805  LIPASE 35   No results for input(s): AMMONIA in the last 168 hours. Coagulation Profile: No results for input(s): INR, PROTIME in the last 168 hours. Cardiac Enzymes: No results for input(s): CKTOTAL, CKMB, CKMBINDEX,  TROPONINI in the last 168 hours. BNP (last 3 results) No results for input(s): PROBNP in the last 8760 hours. HbA1C: No results for input(s): HGBA1C in the last 72 hours. CBG: No results for input(s): GLUCAP in the last 168 hours. Lipid Profile: No results for input(s): CHOL, HDL, LDLCALC, TRIG, CHOLHDL, LDLDIRECT in the last 72 hours. Thyroid Function Tests: No results for input(s): TSH, T4TOTAL, FREET4, T3FREE, THYROIDAB in the last 72 hours. Anemia Panel: No results for input(s): VITAMINB12, FOLATE, FERRITIN, TIBC, IRON, RETICCTPCT in the last 72 hours. Urine analysis:    Component Value Date/Time   COLORURINE YELLOW 12/16/2017 1712   APPEARANCEUR CLEAR 12/16/2017 1712   LABSPEC 1.013 12/16/2017 1712   PHURINE 6.0 12/16/2017 1712   GLUCOSEU 50 (A) 12/16/2017 1712   HGBUR NEGATIVE 12/16/2017 1712   BILIRUBINUR NEGATIVE 12/16/2017 1712   BILIRUBINUR negative 11/25/2012 1312   KETONESUR 20 (A) 12/16/2017 1712   PROTEINUR 30 (A) 12/16/2017 1712   UROBILINOGEN 0.2 11/25/2012 1312   UROBILINOGEN 2.0 (H) 12/31/2010 2219   NITRITE NEGATIVE 12/16/2017 1712   LEUKOCYTESUR NEGATIVE 12/16/2017 1712   Sepsis Labs: '@LABRCNTIP' (procalcitonin:4,lacticidven:4) )No results found for this or any previous visit (from the past 240 hour(s)).   Radiological Exams on Admission: Ct Chest W Contrast  Result Date: 12/16/2017 CLINICAL DATA:  Lung cancer. Nausea, hemoptysis, cough. Shortness of breath. EXAM: CT CHEST, ABDOMEN, AND PELVIS WITH CONTRAST TECHNIQUE: Multidetector CT imaging of the chest, abdomen and pelvis was performed following the standard protocol during bolus administration of intravenous contrast. CONTRAST:  182m ISOVUE-300 IOPAMIDOL (ISOVUE-300) INJECTION 61% COMPARISON:  PET CT 11/26/2017 FINDINGS: CT CHEST FINDINGS Cardiovascular: Tortuosity of the thoracic aorta with scattered aortic calcifications.  Mild aneurysmal dilatation of the ascending thoracic aorta, 4.3 cm maximally. Heart  is normal size. Small pericardial effusion, slightly increased since prior PET CT. Mediastinum/Nodes: Right paratracheal/mediastinal conglomerate mass (likely arising from the medial right upper lobe) again noted which extends into the right hilum and subcarinal region, unchanged. Lungs/Pleura: Right upper lobe mass measures up to 2.6 cm, likely slightly decreased in size. Medial right upper lobe mass invading the mediastinum and right hilum again noted, unchanged. Small right pleural effusion, increased since prior study. No confluent opacity or effusion on the left. Musculoskeletal: Degenerative changes throughout the thoracic spine. No acute bony abnormality. CT ABDOMEN PELVIS FINDINGS Hepatobiliary: No focal hepatic abnormality. Gallbladder unremarkable. Pancreas: No focal abnormality or ductal dilatation. Spleen: No focal abnormality.  Normal size. Adrenals/Urinary Tract: No adrenal abnormality. No focal renal abnormality. No stones or hydronephrosis. Urinary bladder is unremarkable. Stomach/Bowel: Scattered colonic diverticulosis. No active diverticulitis. No evidence of bowel obstruction. There is wall thickening within the 2nd and 3rd portions of the duodenum with mild surrounding inflammation suggesting duodenitis. Vascular/Lymphatic: Aortic atherosclerosis. No enlarged abdominal or pelvic lymph nodes. Reproductive: Mild prominence of the prostate with central calcifications. Other: No free fluid or free air. Musculoskeletal: Moderate compression deformity at L1 is stable. Degenerative changes throughout the lumbar spine. IMPRESSION: Large conglomerate medial right upper lobe mass invading the mediastinum and right hilum again noted, unchanged. Separate 2.6 cm right upper lobe/apical rounded mass. This may have decreased slightly when compared to prior study. Small pericardial effusion and right pleural effusion, both slightly increased since prior study. Diffuse coronary artery disease, aortic  atherosclerosis. 4.3 cm ascending thoracic aortic aneurysm. Recommend annual imaging followup by CTA or MRA. This recommendation follows 2010 ACCF/AHA/AATS/ACR/ASA/SCA/SCAI/SIR/STS/SVM Guidelines for the Diagnosis and Management of Patients with Thoracic Aortic Disease. Circulation. 2010; 121: U440-H474 Mild wall thickening and haziness around the 2nd and 3rd portions of the duodenum suggesting duodenitis. Abdominal aortic atherosclerosis. Prominent prostate with central calcifications. Scattered colonic diverticula.  No active diverticulitis. Electronically Signed   By: Rolm Baptise M.D.   On: 12/16/2017 22:29   Ct Abdomen Pelvis W Contrast  Result Date: 12/16/2017 CLINICAL DATA:  Lung cancer. Nausea, hemoptysis, cough. Shortness of breath. EXAM: CT CHEST, ABDOMEN, AND PELVIS WITH CONTRAST TECHNIQUE: Multidetector CT imaging of the chest, abdomen and pelvis was performed following the standard protocol during bolus administration of intravenous contrast. CONTRAST:  131m ISOVUE-300 IOPAMIDOL (ISOVUE-300) INJECTION 61% COMPARISON:  PET CT 11/26/2017 FINDINGS: CT CHEST FINDINGS Cardiovascular: Tortuosity of the thoracic aorta with scattered aortic calcifications. Mild aneurysmal dilatation of the ascending thoracic aorta, 4.3 cm maximally. Heart is normal size. Small pericardial effusion, slightly increased since prior PET CT. Mediastinum/Nodes: Right paratracheal/mediastinal conglomerate mass (likely arising from the medial right upper lobe) again noted which extends into the right hilum and subcarinal region, unchanged. Lungs/Pleura: Right upper lobe mass measures up to 2.6 cm, likely slightly decreased in size. Medial right upper lobe mass invading the mediastinum and right hilum again noted, unchanged. Small right pleural effusion, increased since prior study. No confluent opacity or effusion on the left. Musculoskeletal: Degenerative changes throughout the thoracic spine. No acute bony abnormality. CT  ABDOMEN PELVIS FINDINGS Hepatobiliary: No focal hepatic abnormality. Gallbladder unremarkable. Pancreas: No focal abnormality or ductal dilatation. Spleen: No focal abnormality.  Normal size. Adrenals/Urinary Tract: No adrenal abnormality. No focal renal abnormality. No stones or hydronephrosis. Urinary bladder is unremarkable. Stomach/Bowel: Scattered colonic diverticulosis. No active diverticulitis. No evidence of bowel obstruction. There is wall thickening within the 2nd  and 3rd portions of the duodenum with mild surrounding inflammation suggesting duodenitis. Vascular/Lymphatic: Aortic atherosclerosis. No enlarged abdominal or pelvic lymph nodes. Reproductive: Mild prominence of the prostate with central calcifications. Other: No free fluid or free air. Musculoskeletal: Moderate compression deformity at L1 is stable. Degenerative changes throughout the lumbar spine. IMPRESSION: Large conglomerate medial right upper lobe mass invading the mediastinum and right hilum again noted, unchanged. Separate 2.6 cm right upper lobe/apical rounded mass. This may have decreased slightly when compared to prior study. Small pericardial effusion and right pleural effusion, both slightly increased since prior study. Diffuse coronary artery disease, aortic atherosclerosis. 4.3 cm ascending thoracic aortic aneurysm. Recommend annual imaging followup by CTA or MRA. This recommendation follows 2010 ACCF/AHA/AATS/ACR/ASA/SCA/SCAI/SIR/STS/SVM Guidelines for the Diagnosis and Management of Patients with Thoracic Aortic Disease. Circulation. 2010; 121: H607-P710 Mild wall thickening and haziness around the 2nd and 3rd portions of the duodenum suggesting duodenitis. Abdominal aortic atherosclerosis. Prominent prostate with central calcifications. Scattered colonic diverticula.  No active diverticulitis. Electronically Signed   By: Rolm Baptise M.D.   On: 12/16/2017 22:29   Dg Chest Port 1 View  Result Date: 12/16/2017 CLINICAL DATA:   Lung cancer, cough, nausea EXAM: PORTABLE CHEST 1 VIEW COMPARISON:  11/07/2017 FINDINGS: Right mediastinal adenopathy again visualized as described on prior PET CT. Heart is mildly enlarged. No confluent airspace opacities or effusions. Previously seen right upper lobe mass on PET CT difficult to visualize by plain film. IMPRESSION: Right mediastinal adenopathy again noted, unchanged. Mild cardiomegaly. Electronically Signed   By: Rolm Baptise M.D.   On: 12/16/2017 20:57      Assessment/Plan Principal Problem:   Intractable nausea and vomiting Active Problems:   Essential hypertension, benign   COPD (chronic obstructive pulmonary disease) (HCC)   Peptic ulcer   Sleep apnea   Aortic aneurysm (HCC)   DVT (deep venous thrombosis) (HCC)   Small cell lung cancer, right upper lobe (HCC)   Nausea & vomiting    1. Abdominal pain with nausea vomiting-with CAT scan showing duodenitis patient placed on Protonix.  Clear liquid diet for now.  If patient's pain improves may advance diet.  Continue antiemetics. 2. Leukocytosis -patient not febrile.  Will check blood cultures.  Need to discuss with oncologist if patient received Neulasta or other medications to increase in white cell count. 3. History of DVT -on apixaban which patient has not taken yesterday due to nausea vomiting.  Patient states he is supposed to be off apixaban from today for Port-A-Cath placement.  We will keep off apixaban for now. 4. Hypertension -for now I have kept patient on PRN IV hydralazine.  If patient is tolerating diet well will restart patient's lisinopril. 5. Borderline diabetes mellitus type 2 we will keep patient on sliding scale coverage. 6. Small cell lung cancer being followed by oncologist has received chemotherapy last week. 7. Ascending thoracic aortic aneurysm 4.3 cm denies any chest pain.  Will need outpatient follow-up. 8. COPD not actively wheezing.   DVT prophylaxis: SCDs. Code Status: Full code. Family  Communication: Discussed with patient. Disposition Plan: Home. Consults called: None. Admission status: Observation.   Rise Patience MD [facility] Pager [phone].  If 7PM-7AM, please contact night-coverage [email or web address] Password [email or web address]  12/17/2017, 3:05 AM

## 2017-12-17 NOTE — Progress Notes (Signed)
Pt stated he feels very nauseated and wants to vomit.  Cpap held tonight, ok by pt and family member at bedside.  RN made aware.

## 2017-12-17 NOTE — Progress Notes (Signed)
Patient ID: Scott Gomez, male   DOB: 1934-05-29, 83 y.o.   MRN: [id number] Aware of request for port a cath placement in pt. He was originally on our OP schedule for tomorrow. We will await final blood cx results before proceeding with procedure. Dr. Julien Nordmann aware.

## 2017-12-17 NOTE — ED Notes (Signed)
Pt tolerating fluids with no difficulty at this time.

## 2017-12-17 NOTE — ED Notes (Signed)
Patient requested to wheel himself to bathroom. Stand by assistance only. Patient A/Ox4. Urinal at bedside.

## 2017-12-17 NOTE — ED Notes (Signed)
Report given to [facility]. [phone].

## 2017-12-17 NOTE — ED Notes (Signed)
Spoke with Dr. Hal Hope, family is requesting updates about their admission.

## 2017-12-18 ENCOUNTER — Other Ambulatory Visit: Payer: Self-pay | Admitting: Family Medicine

## 2017-12-18 ENCOUNTER — Other Ambulatory Visit: Payer: Medicare Other

## 2017-12-18 ENCOUNTER — Ambulatory Visit (HOSPITAL_COMMUNITY): Payer: Medicare Other

## 2017-12-18 ENCOUNTER — Other Ambulatory Visit (HOSPITAL_COMMUNITY): Payer: Medicare Other

## 2017-12-18 DIAGNOSIS — I10 Essential (primary) hypertension: Secondary | ICD-10-CM

## 2017-12-18 DIAGNOSIS — I82621 Acute embolism and thrombosis of deep veins of right upper extremity: Secondary | ICD-10-CM

## 2017-12-18 DIAGNOSIS — J449 Chronic obstructive pulmonary disease, unspecified: Secondary | ICD-10-CM

## 2017-12-18 DIAGNOSIS — R112 Nausea with vomiting, unspecified: Secondary | ICD-10-CM | POA: Diagnosis not present

## 2017-12-18 DIAGNOSIS — E440 Moderate protein-calorie malnutrition: Secondary | ICD-10-CM

## 2017-12-18 DIAGNOSIS — C3411 Malignant neoplasm of upper lobe, right bronchus or lung: Secondary | ICD-10-CM

## 2017-12-18 LAB — MAGNESIUM: Magnesium: 2 mg/dL (ref 1.7–2.4)

## 2017-12-18 LAB — COMPREHENSIVE METABOLIC PANEL
ALBUMIN: 3.4 g/dL — AB (ref 3.5–5.0)
ALT: 15 U/L (ref 0–44)
AST: 13 U/L — AB (ref 15–41)
Alkaline Phosphatase: 53 U/L (ref 38–126)
Anion gap: 6 (ref 5–15)
BUN: 10 mg/dL (ref 8–23)
CO2: 23 mmol/L (ref 22–32)
Calcium: 8.4 mg/dL — ABNORMAL LOW (ref 8.9–10.3)
Chloride: 108 mmol/L (ref 98–111)
Creatinine, Ser: 0.6 mg/dL — ABNORMAL LOW (ref 0.61–1.24)
GFR calc Af Amer: >60 mL/min (ref >60)
GFR calc non Af Amer: >60 mL/min (ref >60)
Glucose, Bld: 108 mg/dL — ABNORMAL HIGH (ref 70–99)
Potassium: 3.7 mmol/L (ref 3.5–5.1)
Sodium: 137 mmol/L (ref 135–145)
Total Bilirubin: 1 mg/dL (ref 0.3–1.2)
Total Protein: 5.7 g/dL — ABNORMAL LOW (ref 6.5–8.1)

## 2017-12-18 LAB — PROTIME-INR
INR: 1.17
Prothrombin Time: 14.8 seconds (ref 11.4–15.2)

## 2017-12-18 LAB — GLUCOSE, CAPILLARY
Glucose-Capillary: 94 mg/dL (ref 70–99)
Glucose-Capillary: 95 mg/dL (ref 70–99)
Glucose-Capillary: 94 mg/dL (ref 70–99)
Glucose-Capillary: 131 mg/dL — ABNORMAL HIGH (ref 70–99)
Glucose-Capillary: 111 mg/dL — ABNORMAL HIGH (ref 70–99)
Glucose-Capillary: 97 mg/dL (ref 70–99)

## 2017-12-18 LAB — CBC
HCT: 35.9 % — ABNORMAL LOW (ref 39.0–52.0)
HEMOGLOBIN: 11.8 g/dL — AB (ref 13.0–17.0)
MCH: 28.4 pg (ref 26.0–34.0)
MCHC: 32.9 g/dL (ref 30.0–36.0)
MCV: 86.3 fL (ref 80.0–100.0)
Platelets: 72 10*3/uL — ABNORMAL LOW (ref 150–400)
RBC: 4.16 MIL/uL — ABNORMAL LOW (ref 4.22–5.81)
RDW: 13.8 % (ref 11.5–15.5)
WBC: 8.8 10*3/uL (ref 4.0–10.5)
nRBC: 0 % (ref 0.0–0.2)

## 2017-12-18 MED ORDER — OXYCODONE HCL 5 MG PO TABS
10.0000 mg | ORAL_TABLET | Freq: Two times a day (BID) | ORAL | Status: DC | PRN
  Administered 2017-12-18 – 2017-12-19 (×2): 10 mg via ORAL
  Filled 2017-12-18 (×2): qty 2

## 2017-12-18 MED ORDER — CYCLOBENZAPRINE HCL 10 MG PO TABS
10.0000 mg | ORAL_TABLET | Freq: Every evening | ORAL | Status: DC | PRN
  Administered 2017-12-18: 10 mg via ORAL
  Filled 2017-12-18: qty 1

## 2017-12-18 NOTE — Progress Notes (Signed)
PROGRESS NOTE    Scott Gomez  WGY:[id number] DOB: February 22, 1934 DOA: 12/16/2017 PCP: Abner Greenspan, MD   Brief Narrative:  Scott Gomez is a 83 y.o. male with recently diagnosed limited stage small cell lung cancer had chemotherapy last week Thursday Friday and Saturday started developing periumbilical abdominal pain with nausea which has been progressively worsening.  Patient states he has had previous history of peptic ulcer disease many years ago.  Due to worsening abdominal pain patient came to the ER.  Denies any diarrhea.  Unable to eat well because of the pain.  He also was recently diagnosed with possible DVT for which patient was placed on apixaban. Patient is off apixaban for Port-A-Cath placement. Pt admitted for further management.   Assessment & Plan:   Principal Problem:   Intractable nausea and vomiting Active Problems:   Essential hypertension, benign   COPD (chronic obstructive pulmonary disease) (HCC)   Peptic ulcer   Sleep apnea   Aortic aneurysm (HCC)   DVT (deep venous thrombosis) (HCC)   Small cell lung cancer, right upper lobe (HCC)   Nausea & vomiting   Malnutrition of moderate degree   1. Abdominal pain with nausea vomiting- with CAT scan showing duodenitis patient placed on Protonix.  Clear liquid diet for now.  If patient's pain improves may advance diet.  Continue antiemetics.  2. Leukocytosis - Currently resolved, afebrile, ??likely due to neulasta, received on 11/30. BC X 2 NGTD  3. History of DVT - pt currently holding apixiban with planned port placement on 12/6. [name] on 12/4 recommends switching to coumadin, can follow up as an outpt and make switch on next outpt visit on 12/25/17 as pt cant afford apixiban  4. Hypertension - Continue lisinopril  5. Borderline diabetes mellitus type 2- Continue SSI, accuchecks  6. Small cell lung cancer being followed by oncologist has received chemotherapy last week.  He notes hemoptysis, which is  likely related to this (on review of past notes, looks like present since early November). Port to be placed by IR on 12/19/17  7. Ascending thoracic aortic aneurysm 4.3 cm denies any chest pain.  Will need outpatient follow-up  8. COPD not actively wheezing  DVT prophylaxis: SCD Code Status: full  Family Communication: Wife and daughter at bedside Disposition Plan: pending    Consultants:   IR  Procedures:   none  Antimicrobials:  Anti-infectives (From admission, onward)   None         Subjective: No new complaints, denies any N/V, or worsening abdominal pain, no chest pain   Objective: Vitals:   12/17/17 2024 12/18/17 0426 12/18/17 0849 12/18/17 1321  BP: 122/84 113/65  (!) 133/110  Pulse: 91 87  83  Resp: 20 20  17   Temp: 98.4 F (36.9 C) 97.7 F (36.5 C)  98.7 F (37.1 C)  TempSrc: Oral Oral  Oral  SpO2: 97% 98% 97% 98%  Weight:      Height:        Intake/Output Summary (Last 24 hours) at 12/18/2017 1511 Last data filed at 12/18/2017 7793 Gross per 24 hour  Intake 2200.93 ml  Output -  Net 2200.93 ml   Filed Weights   12/16/17 1709  Weight: 81.6 kg    Examination:  General: NAD   Cardiovascular: S1, S2 present  Respiratory: CTAB  Abdomen: Soft, nontender, nondistended, bowel sounds present  Musculoskeletal: No bilateral pedal edema noted  Skin: Normal  Psychiatry: Normal mood    Data Reviewed:  I have personally reviewed following labs and imaging studies  CBC: Recent Labs  Lab 12/15/17 0958 12/16/17 1805 12/17/17 0410 12/18/17 0623  WBC 27.5* 23.8* 18.3* 8.8  NEUTROABS 24.4*  --  14.8*  --   HGB 13.0 13.3 12.4* 11.8*  HCT 38.3* 40.1 38.8* 35.9*  MCV 83.8 85.9 87.8 86.3  PLT 158 124* 104* 72*   Basic Metabolic Panel: Recent Labs  Lab 12/15/17 0958 12/16/17 1805 12/17/17 0410 12/18/17 0623  NA 135 132* 135 137  K 4.0 3.2* 3.2* 3.7  CL 104 102 104 108  CO2 22 22 22 23   GLUCOSE 122* 133* 118* 108*  BUN 15 14 13 10     CREATININE 0.88 0.78 0.66 0.60*  CALCIUM 9.2 8.9 8.3* 8.4*  MG 1.8  --   --  2.0   GFR: Estimated Creatinine Clearance: 74.8 mL/min (A) (by C-G formula based on SCr of 0.6 mg/dL (L)). Liver Function Tests: Recent Labs  Lab 12/15/17 0958 12/16/17 1805 12/17/17 0410 12/18/17 0623  AST 18 16 14* 13*  ALT 21 21 17 15   ALKPHOS 51 57 49 53  BILITOT 0.8 1.2 1.1 1.0  PROT 6.5 6.5 6.0* 5.7*  ALBUMIN 3.8 4.0 3.5 3.4*   Recent Labs  Lab 12/16/17 1805  LIPASE 35   No results for input(s): AMMONIA in the last 168 hours. Coagulation Profile: Recent Labs  Lab 12/18/17 1409  INR 1.17   Cardiac Enzymes: No results for input(s): CKTOTAL, CKMB, CKMBINDEX, TROPONINI in the last 168 hours. BNP (last 3 results) No results for input(s): PROBNP in the last 8760 hours. HbA1C: No results for input(s): HGBA1C in the last 72 hours. CBG: Recent Labs  Lab 12/17/17 2034 12/17/17 2340 12/18/17 0556 12/18/17 0806 12/18/17 1202  GLUCAP 121* 94 95 94 131*   Lipid Profile: No results for input(s): CHOL, HDL, LDLCALC, TRIG, CHOLHDL, LDLDIRECT in the last 72 hours. Thyroid Function Tests: No results for input(s): TSH, T4TOTAL, FREET4, T3FREE, THYROIDAB in the last 72 hours. Anemia Panel: No results for input(s): VITAMINB12, FOLATE, FERRITIN, TIBC, IRON, RETICCTPCT in the last 72 hours. Sepsis Labs: No results for input(s): PROCALCITON, LATICACIDVEN in the last 168 hours.  Recent Results (from the past 240 hour(s))  Culture, blood (routine x 2)     Status: None (Preliminary result)   Collection Time: 12/17/17  4:10 AM  Result Value Ref Range Status   Specimen Description   Final    BLOOD RIGHT ANTECUBITAL Performed at [facility] [address], [place], [place] [address]    Special Requests   Final    BOTTLES DRAWN AEROBIC AND ANAEROBIC Blood Culture adequate volume Performed at [facility] [address] [place], [place] [address]     Culture   Final    NO GROWTH 1 DAY Performed at [facility], [place] [address], [place], [place] [address]    Report Status PENDING  Incomplete  Culture, blood (routine x 2)     Status: None (Preliminary result)   Collection Time: 12/17/17  4:10 AM  Result Value Ref Range Status   Specimen Description   Final    BLOOD LEFT ANTECUBITAL Performed at [facility] [address], [place], [place] [address]    Special Requests   Final    BOTTLES DRAWN AEROBIC AND ANAEROBIC Blood Culture adequate volume Performed at [facility] [address], [place], [place] [address]    Culture   Final  NO GROWTH 1 DAY Performed at [facility], [place] [address] [place] , [place] [address]    Report Status PENDING  Incomplete         Radiology Studies: Ct Chest W Contrast  Result Date: 12/16/2017 CLINICAL DATA:  Lung cancer. Nausea, hemoptysis, cough. Shortness of breath. EXAM: CT CHEST, ABDOMEN, AND PELVIS WITH CONTRAST TECHNIQUE: Multidetector CT imaging of the chest, abdomen and pelvis was performed following the standard protocol during bolus administration of intravenous contrast. CONTRAST:  153mL ISOVUE-300 IOPAMIDOL (ISOVUE-300) INJECTION 61% COMPARISON:  PET CT 11/26/2017 FINDINGS: CT CHEST FINDINGS Cardiovascular: Tortuosity of the thoracic aorta with scattered aortic calcifications. Mild aneurysmal dilatation of the ascending thoracic aorta, 4.3 cm maximally. Heart is normal size. Small pericardial effusion, slightly increased since prior PET CT. Mediastinum/Nodes: Right paratracheal/mediastinal conglomerate mass (likely arising from the medial right upper lobe) again noted which extends into the right hilum and subcarinal region, unchanged. Lungs/Pleura: Right upper lobe mass measures up to 2.6 cm, likely slightly decreased in size. Medial right upper lobe mass invading the mediastinum and right hilum again noted, unchanged. Small right pleural  effusion, increased since prior study. No confluent opacity or effusion on the left. Musculoskeletal: Degenerative changes throughout the thoracic spine. No acute bony abnormality. CT ABDOMEN PELVIS FINDINGS Hepatobiliary: No focal hepatic abnormality. Gallbladder unremarkable. Pancreas: No focal abnormality or ductal dilatation. Spleen: No focal abnormality.  Normal size. Adrenals/Urinary Tract: No adrenal abnormality. No focal renal abnormality. No stones or hydronephrosis. Urinary bladder is unremarkable. Stomach/Bowel: Scattered colonic diverticulosis. No active diverticulitis. No evidence of bowel obstruction. There is wall thickening within the 2nd and 3rd portions of the duodenum with mild surrounding inflammation suggesting duodenitis. Vascular/Lymphatic: Aortic atherosclerosis. No enlarged abdominal or pelvic lymph nodes. Reproductive: Mild prominence of the prostate with central calcifications. Other: No free fluid or free air. Musculoskeletal: Moderate compression deformity at L1 is stable. Degenerative changes throughout the lumbar spine. IMPRESSION: Large conglomerate medial right upper lobe mass invading the mediastinum and right hilum again noted, unchanged. Separate 2.6 cm right upper lobe/apical rounded mass. This may have decreased slightly when compared to prior study. Small pericardial effusion and right pleural effusion, both slightly increased since prior study. Diffuse coronary artery disease, aortic atherosclerosis. 4.3 cm ascending thoracic aortic aneurysm. Recommend annual imaging followup by CTA or MRA. This recommendation follows 2010 ACCF/AHA/AATS/ACR/ASA/SCA/SCAI/SIR/STS/SVM Guidelines for the Diagnosis and Management of Patients with Thoracic Aortic Disease. Circulation. 2010; 121: J188-C166 Mild wall thickening and haziness around the 2nd and 3rd portions of the duodenum suggesting duodenitis. Abdominal aortic atherosclerosis. Prominent prostate with central calcifications. Scattered  colonic diverticula.  No active diverticulitis. Electronically Signed   By: Rolm Baptise M.D.   On: 12/16/2017 22:29   Ct Abdomen Pelvis W Contrast  Result Date: 12/16/2017 CLINICAL DATA:  Lung cancer. Nausea, hemoptysis, cough. Shortness of breath. EXAM: CT CHEST, ABDOMEN, AND PELVIS WITH CONTRAST TECHNIQUE: Multidetector CT imaging of the chest, abdomen and pelvis was performed following the standard protocol during bolus administration of intravenous contrast. CONTRAST:  156mL ISOVUE-300 IOPAMIDOL (ISOVUE-300) INJECTION 61% COMPARISON:  PET CT 11/26/2017 FINDINGS: CT CHEST FINDINGS Cardiovascular: Tortuosity of the thoracic aorta with scattered aortic calcifications. Mild aneurysmal dilatation of the ascending thoracic aorta, 4.3 cm maximally. Heart is normal size. Small pericardial effusion, slightly increased since prior PET CT. Mediastinum/Nodes: Right paratracheal/mediastinal conglomerate mass (likely arising from the medial right upper lobe) again noted which extends into the right hilum and subcarinal region, unchanged. Lungs/Pleura: Right upper lobe mass measures up  to 2.6 cm, likely slightly decreased in size. Medial right upper lobe mass invading the mediastinum and right hilum again noted, unchanged. Small right pleural effusion, increased since prior study. No confluent opacity or effusion on the left. Musculoskeletal: Degenerative changes throughout the thoracic spine. No acute bony abnormality. CT ABDOMEN PELVIS FINDINGS Hepatobiliary: No focal hepatic abnormality. Gallbladder unremarkable. Pancreas: No focal abnormality or ductal dilatation. Spleen: No focal abnormality.  Normal size. Adrenals/Urinary Tract: No adrenal abnormality. No focal renal abnormality. No stones or hydronephrosis. Urinary bladder is unremarkable. Stomach/Bowel: Scattered colonic diverticulosis. No active diverticulitis. No evidence of bowel obstruction. There is wall thickening within the 2nd and 3rd portions of the  duodenum with mild surrounding inflammation suggesting duodenitis. Vascular/Lymphatic: Aortic atherosclerosis. No enlarged abdominal or pelvic lymph nodes. Reproductive: Mild prominence of the prostate with central calcifications. Other: No free fluid or free air. Musculoskeletal: Moderate compression deformity at L1 is stable. Degenerative changes throughout the lumbar spine. IMPRESSION: Large conglomerate medial right upper lobe mass invading the mediastinum and right hilum again noted, unchanged. Separate 2.6 cm right upper lobe/apical rounded mass. This may have decreased slightly when compared to prior study. Small pericardial effusion and right pleural effusion, both slightly increased since prior study. Diffuse coronary artery disease, aortic atherosclerosis. 4.3 cm ascending thoracic aortic aneurysm. Recommend annual imaging followup by CTA or MRA. This recommendation follows 2010 ACCF/AHA/AATS/ACR/ASA/SCA/SCAI/SIR/STS/SVM Guidelines for the Diagnosis and Management of Patients with Thoracic Aortic Disease. Circulation. 2010; 121: L798-X211 Mild wall thickening and haziness around the 2nd and 3rd portions of the duodenum suggesting duodenitis. Abdominal aortic atherosclerosis. Prominent prostate with central calcifications. Scattered colonic diverticula.  No active diverticulitis. Electronically Signed   By: Rolm Baptise M.D.   On: 12/16/2017 22:29   Dg Chest Port 1 View  Result Date: 12/16/2017 CLINICAL DATA:  Lung cancer, cough, nausea EXAM: PORTABLE CHEST 1 VIEW COMPARISON:  11/07/2017 FINDINGS: Right mediastinal adenopathy again visualized as described on prior PET CT. Heart is mildly enlarged. No confluent airspace opacities or effusions. Previously seen right upper lobe mass on PET CT difficult to visualize by plain film. IMPRESSION: Right mediastinal adenopathy again noted, unchanged. Mild cardiomegaly. Electronically Signed   By: Rolm Baptise M.D.   On: 12/16/2017 20:57        Scheduled  Meds: . feeding supplement (ENSURE ENLIVE)  237 mL Oral BID BM  . gabapentin  200 mg Oral TID  . insulin aspart  0-9 Units Subcutaneous Q4H  . latanoprost  1 drop Both Eyes QHS  . lisinopril  10 mg Oral Daily  . pantoprazole (PROTONIX) IV  40 mg Intravenous Q12H  . umeclidinium bromide  1 puff Inhalation Daily   Continuous Infusions:    LOS: 0 days      Alma Friendly, MD [facility]  If 7PM-7AM, please contact night-coverage 12/18/2017, 3:11 PM

## 2017-12-18 NOTE — Consult Note (Signed)
Chief Complaint: Patient was seen in consultation today for lung cancer.  Referring Physician(s): Curt Bears; Elodia Florence  Supervising Physician: Daryll Brod  Patient Status: [facility] - In-pt  History of Present Illness: Scott Gomez is a 83 y.o. male with a past medical history of hypercholesterolemia, DVT, lung cancer, PUD, nephrolithiasis, diabetes mellitus type II, testosterone deficiency, carpal tunnel syndrome, arthritis, and anxiety. He was unfortunately diagnosed with small cell lung cancer 11/2017. His cancer is managed by Dr. Julien Nordmann. He is currently undergoing concurrent chemoradiation therapy. He was scheduled for an image-guided Port-a-cath placement with our department on an outpatient basis for today, 12/18/2017. However, he presented to [facility] ED 12/16/2017 with complaints of N/V and abdominal pain. He was admitted for management of intractable N/V. Blood cultures were ordered prior to Port-a-cath placement. Results today show no growth after 1 day.  IR requested by Dr. Julien Nordmann and Dr. Florene Glen for possible image-guided Port-a-cath insertion. Patient awake and alert laying in bed with no complaints at this time. Denies fever, chills, chest pain, dyspnea, abdominal pain, dizziness, or headache.  Patient is currently prescribed Eliquis 5 mg taken twice daily- reports last dose 12/16/2017.   Past Medical History:  Diagnosis Date  . Anxiety   . Arthritis    osteoarthritis both hands  . Carpal tunnel syndrome   . Diabetes mellitus    type II  . Fatigue   . Hypercholesteremia   . Hypopotassemia   . Leg cramps   . Nephrolithiasis    Hx of  . Palpitations    Hx of   . Shoulder pain, left   . Snoring   . Testosterone deficiency   . Ulcer    peptic ulcer disease    Past Surgical History:  Procedure Laterality Date  . BACK SURGERY  1978   x2 disc    Allergies: Buspirone hcl; Crestor [rosuvastatin calcium]; and Prednisone  Medications: Prior to  Admission medications   Medication Sig Start Date End Date Taking? Authorizing Provider  albuterol (PROAIR HFA) 108 (90 Base) MCG/ACT inhaler Inhale 2 puffs into the lungs every 4 (four) hours as needed for wheezing or shortness of breath. 11/03/17  Yes Martyn Ehrich, NP  ALPRAZolam Duanne Moron) 0.5 MG tablet TAKE ONE (1) TABLET BY MOUTH TWO (2) TIMES DAILY AS NEEDED FOR ANXIETY Patient taking differently: Take 0.5 mg by mouth at bedtime as needed for sleep.  08/18/17  Yes [name], [name], MD  cyclobenzaprine (FLEXERIL) 10 MG tablet Take 10 mg by mouth at bedtime as needed for muscle spasms.  11/19/10  Yes [name], [name], MD  docusate sodium (COLACE) 100 MG capsule Take 100 mg by mouth daily.   Yes [provider]  gabapentin (NEURONTIN) 100 MG capsule Take 200 mg by mouth 3 (three) times daily.    Yes [provider]  levalbuterol (XOPENEX) 0.63 MG/3ML nebulizer solution Take 3 mLs (0.63 mg total) by nebulization every 4 (four) hours as needed for wheezing or shortness of breath. 11/07/17  Yes Martyn Ehrich, NP  lisinopril (PRINIVIL,ZESTRIL) 10 MG tablet Take 1 tablet (10 mg total) by mouth daily. 04/14/17  Yes [name], [name], MD  ondansetron (ZOFRAN) 8 MG tablet Take 1 tablet (8 mg total) by mouth every 8 (eight) hours as needed for nausea or vomiting. 12/15/17  Yes Curt Bears, MD  OVER THE COUNTER MEDICATION Take 1 tablet by mouth daily. Vitamin b 12   Yes [provider]  Oxycodone HCl 10 MG TABS  Take 1 tablet by mouth 2 (two) times daily as needed (pain).  10/28/12  Yes [provider]  potassium chloride SA (K-DUR,KLOR-CON) 20 MEQ tablet TAKE ONE (1) TABLET BY MOUTH EVERY DAY 04/14/17  Yes [name], [name], MD  prochlorperazine (COMPAZINE) 10 MG tablet Take 1 tablet (10 mg total) by mouth every 6 (six) hours as needed for nausea or vomiting. 12/04/17  Yes Curt Bears, MD  Tiotropium Bromide Monohydrate (SPIRIVA RESPIMAT) 2.5 MCG/ACT AERS Inhale 2 puffs  into the lungs daily. 11/14/17  Yes Martyn Ehrich, NP  travoprost, benzalkonium, (TRAVATAN) 0.004 % ophthalmic solution Place 1 drop into both eyes at bedtime.     Yes [provider]  alendronate (FOSAMAX) 70 MG tablet Take 70 mg by mouth once a week. Take with a full glass of water on an empty stomach.    [provider]  apixaban (ELIQUIS) 5 MG TABS tablet Take 1 tablet (5 mg total) by mouth 2 (two) times daily. 12/16/17   Rigoberto Noel, MD  Blood Glucose Monitoring Suppl (ONETOUCH VERIO IQ SYSTEM) w/Device KIT Use to test blood sugar once daily and as needed dx R73.9 10/14/16   [name], Roque Lias A, MD  glucose blood (ONETOUCH VERIO) test strip USE TO CHECK BLOOD SUGAR ONCE DAILY 04/23/17   [name], [name], MD  LORazepam (ATIVAN) 1 MG tablet Take 1 tablet (1 mg total) by mouth every 8 (eight) hours. Pt is to take 1 tablet 30 minutes prior to MRI 12/15/17   Curt Bears, MD  magic mouthwash w/lidocaine SOLN Take 5 mLs by mouth 3 (three) times daily as needed for mouth pain (swish and spit). Patient not taking: Reported on 12/16/2017 11/18/17   [name], [name], MD  ONE TOUCH LANCETS MISC Use to test blood sugar once daily and as needed. Dx 790.29 05/06/13   Abner Greenspan, MD     Family History  Problem Relation Age of Onset  . Cancer Brother        kidney  . Heart disease Brother        MI  . Hypertension Mother   . Allergies Mother   . Hypertension Father   . Allergies Father   . Cancer Sister   . Emphysema Sister   . Heart attack Sister     Social History   Socioeconomic History  . Marital status: Married    Spouse name: Not on file  . Number of children: Not on file  . Years of education: Not on file  . Highest education level: Not on file  Occupational History  . Not on file  Social Needs  . Financial resource strain: Not on file  . Food insecurity:    Worry: Not on file    Inability: Not on file  . Transportation needs:    Medical: Not on file     Non-medical: Not on file  Tobacco Use  . Smoking status: Former Smoker    Packs/day: 2.00    Years: 38.00    Pack years: 76.00    Types: Cigarettes    Last attempt to quit: 01/14/1993    Years since quitting: 24.9  . Smokeless tobacco: Never Used  Substance and Sexual Activity  . Alcohol use: No    Alcohol/week: 0.0 standard drinks  . Drug use: No  . Sexual activity: Never  Lifestyle  . Physical activity:    Days per week: Not on file    Minutes per session: Not on file  .  Stress: Not on file  Relationships  . Social connections:    Talks on phone: Not on file    Gets together: Not on file    Attends religious service: Not on file    Active member of club or organization: Not on file    Attends meetings of clubs or organizations: Not on file    Relationship status: Not on file  Other Topics Concern  . Not on file  Social History Narrative   Lives with wife Ziyan Schoon   Retired     Review of Systems: A 12 point ROS discussed and pertinent positives are indicated in the HPI above.  All other systems are negative.  Review of Systems  Constitutional: Negative for chills and fever.  Respiratory: Negative for shortness of breath and wheezing.   Cardiovascular: Negative for chest pain and palpitations.  Gastrointestinal: Negative for abdominal pain.  Neurological: Negative for dizziness and headaches.  Psychiatric/Behavioral: Negative for behavioral problems and confusion.    Vital Signs: BP (!) 133/110   Pulse 83   Temp 98.7 F (37.1 C) (Oral)   Resp 17   Ht _0  (1.778 m)   Wt 180 lb (81.6 kg)   SpO2 98%   BMI 25.83 kg/m   Physical Exam  Constitutional: He is oriented to person, place, and time. He appears well-developed and well-nourished. No distress.  Cardiovascular: Normal rate, regular rhythm and normal heart sounds.  No murmur heard. Pulmonary/Chest: Effort normal and breath sounds normal. No respiratory distress. He has no wheezes.    Neurological: He is alert and oriented to person, place, and time.  Skin: Skin is warm and dry.  Psychiatric: He has a normal mood and affect. His behavior is normal. Judgment and thought content normal.  Nursing note and vitals reviewed.    MD Evaluation Airway: WNL Heart: WNL Abdomen: WNL Chest/ Lungs: WNL ASA  Classification: 3 Mallampati/Airway Score: Two   Imaging: Ct Chest W Contrast  Result Date: 12/16/2017 CLINICAL DATA:  Lung cancer. Nausea, hemoptysis, cough. Shortness of breath. EXAM: CT CHEST, ABDOMEN, AND PELVIS WITH CONTRAST TECHNIQUE: Multidetector CT imaging of the chest, abdomen and pelvis was performed following the standard protocol during bolus administration of intravenous contrast. CONTRAST:  110m ISOVUE-300 IOPAMIDOL (ISOVUE-300) INJECTION 61% COMPARISON:  PET CT 11/26/2017 FINDINGS: CT CHEST FINDINGS Cardiovascular: Tortuosity of the thoracic aorta with scattered aortic calcifications. Mild aneurysmal dilatation of the ascending thoracic aorta, 4.3 cm maximally. Heart is normal size. Small pericardial effusion, slightly increased since prior PET CT. Mediastinum/Nodes: Right paratracheal/mediastinal conglomerate mass (likely arising from the medial right upper lobe) again noted which extends into the right hilum and subcarinal region, unchanged. Lungs/Pleura: Right upper lobe mass measures up to 2.6 cm, likely slightly decreased in size. Medial right upper lobe mass invading the mediastinum and right hilum again noted, unchanged. Small right pleural effusion, increased since prior study. No confluent opacity or effusion on the left. Musculoskeletal: Degenerative changes throughout the thoracic spine. No acute bony abnormality. CT ABDOMEN PELVIS FINDINGS Hepatobiliary: No focal hepatic abnormality. Gallbladder unremarkable. Pancreas: No focal abnormality or ductal dilatation. Spleen: No focal abnormality.  Normal size. Adrenals/Urinary Tract: No adrenal abnormality. No  focal renal abnormality. No stones or hydronephrosis. Urinary bladder is unremarkable. Stomach/Bowel: Scattered colonic diverticulosis. No active diverticulitis. No evidence of bowel obstruction. There is wall thickening within the 2nd and 3rd portions of the duodenum with mild surrounding inflammation suggesting duodenitis. Vascular/Lymphatic: Aortic atherosclerosis. No enlarged abdominal or pelvic lymph nodes. Reproductive: Mild  prominence of the prostate with central calcifications. Other: No free fluid or free air. Musculoskeletal: Moderate compression deformity at L1 is stable. Degenerative changes throughout the lumbar spine. IMPRESSION: Large conglomerate medial right upper lobe mass invading the mediastinum and right hilum again noted, unchanged. Separate 2.6 cm right upper lobe/apical rounded mass. This may have decreased slightly when compared to prior study. Small pericardial effusion and right pleural effusion, both slightly increased since prior study. Diffuse coronary artery disease, aortic atherosclerosis. 4.3 cm ascending thoracic aortic aneurysm. Recommend annual imaging followup by CTA or MRA. This recommendation follows 2010 ACCF/AHA/AATS/ACR/ASA/SCA/SCAI/SIR/STS/SVM Guidelines for the Diagnosis and Management of Patients with Thoracic Aortic Disease. Circulation. 2010; 121: W258-N277 Mild wall thickening and haziness around the 2nd and 3rd portions of the duodenum suggesting duodenitis. Abdominal aortic atherosclerosis. Prominent prostate with central calcifications. Scattered colonic diverticula.  No active diverticulitis. Electronically Signed   By: Rolm Baptise M.D.   On: 12/16/2017 22:29   Ct Abdomen Pelvis W Contrast  Result Date: 12/16/2017 CLINICAL DATA:  Lung cancer. Nausea, hemoptysis, cough. Shortness of breath. EXAM: CT CHEST, ABDOMEN, AND PELVIS WITH CONTRAST TECHNIQUE: Multidetector CT imaging of the chest, abdomen and pelvis was performed following the standard protocol during  bolus administration of intravenous contrast. CONTRAST:  164m ISOVUE-300 IOPAMIDOL (ISOVUE-300) INJECTION 61% COMPARISON:  PET CT 11/26/2017 FINDINGS: CT CHEST FINDINGS Cardiovascular: Tortuosity of the thoracic aorta with scattered aortic calcifications. Mild aneurysmal dilatation of the ascending thoracic aorta, 4.3 cm maximally. Heart is normal size. Small pericardial effusion, slightly increased since prior PET CT. Mediastinum/Nodes: Right paratracheal/mediastinal conglomerate mass (likely arising from the medial right upper lobe) again noted which extends into the right hilum and subcarinal region, unchanged. Lungs/Pleura: Right upper lobe mass measures up to 2.6 cm, likely slightly decreased in size. Medial right upper lobe mass invading the mediastinum and right hilum again noted, unchanged. Small right pleural effusion, increased since prior study. No confluent opacity or effusion on the left. Musculoskeletal: Degenerative changes throughout the thoracic spine. No acute bony abnormality. CT ABDOMEN PELVIS FINDINGS Hepatobiliary: No focal hepatic abnormality. Gallbladder unremarkable. Pancreas: No focal abnormality or ductal dilatation. Spleen: No focal abnormality.  Normal size. Adrenals/Urinary Tract: No adrenal abnormality. No focal renal abnormality. No stones or hydronephrosis. Urinary bladder is unremarkable. Stomach/Bowel: Scattered colonic diverticulosis. No active diverticulitis. No evidence of bowel obstruction. There is wall thickening within the 2nd and 3rd portions of the duodenum with mild surrounding inflammation suggesting duodenitis. Vascular/Lymphatic: Aortic atherosclerosis. No enlarged abdominal or pelvic lymph nodes. Reproductive: Mild prominence of the prostate with central calcifications. Other: No free fluid or free air. Musculoskeletal: Moderate compression deformity at L1 is stable. Degenerative changes throughout the lumbar spine. IMPRESSION: Large conglomerate medial right upper  lobe mass invading the mediastinum and right hilum again noted, unchanged. Separate 2.6 cm right upper lobe/apical rounded mass. This may have decreased slightly when compared to prior study. Small pericardial effusion and right pleural effusion, both slightly increased since prior study. Diffuse coronary artery disease, aortic atherosclerosis. 4.3 cm ascending thoracic aortic aneurysm. Recommend annual imaging followup by CTA or MRA. This recommendation follows 2010 ACCF/AHA/AATS/ACR/ASA/SCA/SCAI/SIR/STS/SVM Guidelines for the Diagnosis and Management of Patients with Thoracic Aortic Disease. Circulation. 2010; 121: [id number] wall thickening and haziness around the 2nd and 3rd portions of the duodenum suggesting duodenitis. Abdominal aortic atherosclerosis. Prominent prostate with central calcifications. Scattered colonic diverticula.  No active diverticulitis. Electronically Signed   By: KRolm BaptiseM.D.   On: 12/16/2017 22:29   Nm Pet Image  Initial (pi) Skull Base To Thigh  Result Date: 11/26/2017 CLINICAL DATA:  Initial treatment strategy for lung mass. EXAM: NUCLEAR MEDICINE PET SKULL BASE TO THIGH TECHNIQUE: 9.3 mCi F-18 FDG was injected intravenously. Full-ring PET imaging was performed from the skull base to thigh after the radiotracer. CT data was obtained and used for attenuation correction and anatomic localization. Fasting blood glucose: 136 mg/dl COMPARISON:  Chest CT 11/14/2017 FINDINGS: Mediastinal blood pool activity: SUV max 2.62 NECK: Right supraclavicular nodal mass, recently biopsied measures a maximum of 3.7 cm on image number 45 and the SUV max is 9.99 No upper cervical adenopathy or left-sided supraclavicular adenopathy. No axillary adenopathy. Incidental CT findings: none CHEST: Large central right lung mass invading the hilum and mediastinum is markedly hypermetabolic with SUV max of 93.73. Separate 3 cm right upper lobe lung lesion has an SUV max of 14.15. Contralateral  prevascular lymph node measures 12 mm and the SUV max is 6.22. No other pulmonary nodules are identified. Incidental CT findings: Advanced atherosclerotic calcifications involving the aorta and coronary arteries. ABDOMEN/PELVIS: No abnormal hypermetabolic activity within the liver, pancreas, adrenal glands, or spleen. No hypermetabolic lymph nodes in the abdomen or pelvis. Incidental CT findings: Advanced atherosclerotic calcifications involving the aorta and iliac arteries. No aneurysm. SKELETON: No focal hypermetabolic activity to suggest skeletal metastasis. Incidental CT findings: none IMPRESSION: 1. Large right central lung mass invading the mediastinum and right hilum demonstrates marked hypermetabolism consistent with neoplasm. 2. Separate 3.7 cm right apical lung mass is hypermetabolic and may be the primary tumor or a metastatic focus. 3. Right supraclavicular metastatic adenopathy as recently biopsied. 4. No PET-CT findings to suggest abdominal/pelvic metastatic disease or osseous metastatic disease. Electronically Signed   By: Marijo Sanes M.D.   On: 11/26/2017 16:59   Dg Chest Port 1 View  Result Date: 12/16/2017 CLINICAL DATA:  Lung cancer, cough, nausea EXAM: PORTABLE CHEST 1 VIEW COMPARISON:  11/07/2017 FINDINGS: Right mediastinal adenopathy again visualized as described on prior PET CT. Heart is mildly enlarged. No confluent airspace opacities or effusions. Previously seen right upper lobe mass on PET CT difficult to visualize by plain film. IMPRESSION: Right mediastinal adenopathy again noted, unchanged. Mild cardiomegaly. Electronically Signed   By: Rolm Baptise M.D.   On: 12/16/2017 20:57   Vas [place] Upper Extremity Venous Duplex  Result Date: 11/24/2017 UPPER VENOUS STUDY  Indications: IJ clot noted during neck biopsy for lymphadenopathy Performing Technologist: Landry Mellow RDMS, RVT  Examination Guidelines: A complete evaluation includes B-mode imaging, spectral Doppler, color Doppler,  and power Doppler as needed of all accessible portions of each vessel. Bilateral testing is considered an integral part of a complete examination. Limited examinations for reoccurring indications may be performed as noted.  Right Findings: +----------+------------+----------+---------+-----------+-------+ RIGHT     CompressiblePropertiesPhasicitySpontaneousSummary +----------+------------+----------+---------+-----------+-------+ IJV           None                 Yes       Yes     Acute  +----------+------------+----------+---------+-----------+-------+ Subclavian    Full                 Yes       Yes            +----------+------------+----------+---------+-----------+-------+ Axillary      Full                 Yes       Yes            +----------+------------+----------+---------+-----------+-------+  Brachial      Full                 Yes       Yes            +----------+------------+----------+---------+-----------+-------+ Radial        Full                                          +----------+------------+----------+---------+-----------+-------+ Ulnar         Full                                          +----------+------------+----------+---------+-----------+-------+ Cephalic      Full                                          +----------+------------+----------+---------+-----------+-------+ Basilic       Full                                          +----------+------------+----------+---------+-----------+-------+  Left Findings: +----------+------------+----------+---------+-----------+-------+ LEFT      CompressiblePropertiesPhasicitySpontaneousSummary +----------+------------+----------+---------+-----------+-------+ IJV           Full                 Yes       Yes            +----------+------------+----------+---------+-----------+-------+ Subclavian                         Yes       Yes             +----------+------------+----------+---------+-----------+-------+  Summary:  Right: Findings consistent with acute deep vein thrombosis involving the right internal jugular vein.  Left: No evidence of thrombosis in the subclavian.  *See table(s) above for measurements and observations.  Diagnosing physician: Ruta Hinds MD Electronically signed by Ruta Hinds MD on 11/24/2017 at 4:22:37 PM.    Final     Labs:  CBC: Recent Labs    12/15/17 0958 12/16/17 1805 12/17/17 0410 12/18/17 0623  WBC 27.5* 23.8* 18.3* 8.8  HGB 13.0 13.3 12.4* 11.8*  HCT 38.3* 40.1 38.8* 35.9*  PLT 158 124* 104* 72*    COAGS: Recent Labs    11/24/17 1126  INR 1.14    BMP: Recent Labs    12/15/17 0958 12/16/17 1805 12/17/17 0410 12/18/17 0623  NA 135 132* 135 137  K 4.0 3.2* 3.2* 3.7  CL 104 102 104 108  CO2 _0 GLUCOSE 122* 133* 118* 108*  BUN _1 CALCIUM 9.2 8.9 8.3* 8.4*  CREATININE 0.88 0.78 0.66 0.60*  GFRNONAA >60 >60 >60 >60  GFRAA >60 >60 >60 >60    LIVER FUNCTION TESTS: Recent Labs    12/15/17 0958 12/16/17 1805 12/17/17 0410 12/18/17 0623  BILITOT 0.8 1.2 1.1 1.0  AST 18 16 14* 13*  ALT _2 ALKPHOS 51 57 49 53  PROT 6.5 6.5 6.0* 5.7*  ALBUMIN 3.8 4.0 3.5 3.4*    TUMOR  MARKERS: No results for input(s): AFPTM, CEA, CA199, CHROMGRNA in the last 8760 hours.  Assessment and Plan:  Lung cancer. Plan for image-guided Port-a-cath insertion tentatively for tomorrow 12/19/2017 with Dr. Annamaria Boots. Patient will be NPO at midnight. Afebrile and WBCs WNL. Patient is currently prescribed Eliquis 5 mg taken twice daily, reports last dose 12/16/2017- ok to proceed tomorrow per IR protocol. INR pending.  Risks and benefits of image guided port-a-catheter placement was discussed with the patient including, but not limited to bleeding, infection, pneumothorax, or fibrin sheath development and need for additional procedures. All of the patient's questions  were answered, patient is agreeable to proceed. Consent signed and in chart.   Thank you for this interesting consult.  I greatly enjoyed meeting Scott Gomez and look forward to participating in their care.  A copy of this report was sent to the requesting provider on this date.  Electronically Signed: Earley Abide, PA-C 12/18/2017, 1:41 PM   I spent a total of 40 Minutes in face to face in clinical consultation, greater than 50% of which was counseling/coordinating care for lung cancer.

## 2017-12-18 NOTE — Progress Notes (Signed)
PROGRESS NOTE    Scott Gomez  XHB:[id number] DOB: Jul 03, 1934 DOA: 12/16/2017 PCP: Abner Greenspan, MD   Brief Narrative:  Scott Gomez is a 83 y.o. male with recently diagnosed limited stage small cell lung cancer had chemotherapy last week Thursday Friday and Saturday started developing periumbilical abdominal pain with nausea which has been progressively worsening.  Patient states he has had previous history of peptic ulcer disease many years ago.  Due to worsening abdominal pain patient came to the ER.  Denies any diarrhea.  Unable to eat well because of the pain.  He also was recently diagnosed with possible DVT for which patient was placed on apixaban.  Has not taken his apixaban yesterday because of the nausea and vomiting.  Patient states he supposed to be off apixaban from today for likely Port-A-Cath placement.   Assessment & Plan:   Principal Problem:   Intractable nausea and vomiting Active Problems:   Essential hypertension, benign   COPD (chronic obstructive pulmonary disease) (HCC)   Peptic ulcer   Sleep apnea   Aortic aneurysm (HCC)   DVT (deep venous thrombosis) (HCC)   Small cell lung cancer, right upper lobe (HCC)   Nausea & vomiting   1. Abdominal pain with nausea vomiting- with CAT scan showing duodenitis patient placed on Protonix.  Clear liquid diet for now.  If patient's pain improves may advance diet.  Continue antiemetics.  2. Leukocytosis - afebrile.  blood cx pending.  Looks like he received neulasta on 11/30  3. History of DVT - pt currently holding apixiban with planned port placement on 12/5.  Will continue to hold eliquis for now.  See telephone note from [name] on 12/4 who mentions consider switch to coumadin, would discuss further with [name].  4. Hypertension - resume lisinopril, follow BP  5. Borderline diabetes mellitus type 2 we will keep patient on sliding scale coverage.  6. Small cell lung cancer being followed by oncologist  has received chemotherapy last week.  He notes hemoptysis, which is likely related to this (on review of past notes, looks like present since early November). 1. Will attempt to arrange for port placement on 12/5 with IR  7. Ascending thoracic aortic aneurysm 4.3 cm denies any chest pain.  Will need outpatient follow-up.  8. COPD not actively wheezing.  DVT prophylaxis: SCD Code Status: full  Family Communication: none at bedside Disposition Plan: pending    Consultants:   IR  Procedures:   none  Antimicrobials:  Anti-infectives (From admission, onward)   None         Subjective: Decreased appetite Abdominal discomfort Hemoptysis  Objective: Vitals:   12/17/17 1230 12/17/17 1300 12/17/17 1335 12/17/17 2024  BP: 129/78 134/77 119/77 122/84  Pulse: 95 82 80 91  Resp: (!) 21 (!) 24 (!) 22 20  Temp:   97.6 F (36.4 C) 98.4 F (36.9 C)  TempSrc:   Oral Oral  SpO2: 99% 99% 96% 97%  Weight:      Height:        Intake/Output Summary (Last 24 hours) at 12/18/2017 0005 Last data filed at 12/17/2017 1619 Gross per 24 hour  Intake 838.68 ml  Output -  Net 838.68 ml   Filed Weights   12/16/17 1709  Weight: 81.6 kg    Examination:  General exam: Appears calm and comfortable  Respiratory system: Clear to auscultation. Respiratory effort normal. Cardiovascular system: S1 & S2 heard, RRR. Gastrointestinal system: Abdomen is nondistended, soft and  mildly ttp Central nervous system: Alert and oriented. No focal neurological deficits. Extremities: moving all extremities Skin: No rashes, lesions or ulcers Psychiatry: Judgement and insight appear normal. Mood & affect appropriate.     Data Reviewed: I have personally reviewed following labs and imaging studies  CBC: Recent Labs  Lab 12/15/17 0958 12/16/17 1805 12/17/17 0410  WBC 27.5* 23.8* 18.3*  NEUTROABS 24.4*  --  14.8*  HGB 13.0 13.3 12.4*  HCT 38.3* 40.1 38.8*  MCV 83.8 85.9 87.8  PLT 158 124*  323*   Basic Metabolic Panel: Recent Labs  Lab 12/15/17 0958 12/16/17 1805 12/17/17 0410  NA 135 132* 135  K 4.0 3.2* 3.2*  CL 104 102 104  CO2 22 22 22   GLUCOSE 122* 133* 118*  BUN 15 14 13   CREATININE 0.88 0.78 0.66  CALCIUM 9.2 8.9 8.3*  MG 1.8  --   --    GFR: Estimated Creatinine Clearance: 74.8 mL/min (by C-G formula based on SCr of 0.66 mg/dL). Liver Function Tests: Recent Labs  Lab 12/15/17 0958 12/16/17 1805 12/17/17 0410  AST 18 16 14*  ALT 21 21 17   ALKPHOS 51 57 49  BILITOT 0.8 1.2 1.1  PROT 6.5 6.5 6.0*  ALBUMIN 3.8 4.0 3.5   Recent Labs  Lab 12/16/17 1805  LIPASE 35   No results for input(s): AMMONIA in the last 168 hours. Coagulation Profile: No results for input(s): INR, PROTIME in the last 168 hours. Cardiac Enzymes: No results for input(s): CKTOTAL, CKMB, CKMBINDEX, TROPONINI in the last 168 hours. BNP (last 3 results) No results for input(s): PROBNP in the last 8760 hours. HbA1C: No results for input(s): HGBA1C in the last 72 hours. CBG: Recent Labs  Lab 12/17/17 0622 12/17/17 1206 12/17/17 1557 12/17/17 2034  GLUCAP 93 140* 103* 121*   Lipid Profile: No results for input(s): CHOL, HDL, LDLCALC, TRIG, CHOLHDL, LDLDIRECT in the last 72 hours. Thyroid Function Tests: No results for input(s): TSH, T4TOTAL, FREET4, T3FREE, THYROIDAB in the last 72 hours. Anemia Panel: No results for input(s): VITAMINB12, FOLATE, FERRITIN, TIBC, IRON, RETICCTPCT in the last 72 hours. Sepsis Labs: No results for input(s): PROCALCITON, LATICACIDVEN in the last 168 hours.  No results found for this or any previous visit (from the past 240 hour(s)).       Radiology Studies: Ct Chest W Contrast  Result Date: 12/16/2017 CLINICAL DATA:  Lung cancer. Nausea, hemoptysis, cough. Shortness of breath. EXAM: CT CHEST, ABDOMEN, AND PELVIS WITH CONTRAST TECHNIQUE: Multidetector CT imaging of the chest, abdomen and pelvis was performed following the standard  protocol during bolus administration of intravenous contrast. CONTRAST:  163mL ISOVUE-300 IOPAMIDOL (ISOVUE-300) INJECTION 61% COMPARISON:  PET CT 11/26/2017 FINDINGS: CT CHEST FINDINGS Cardiovascular: Tortuosity of the thoracic aorta with scattered aortic calcifications. Mild aneurysmal dilatation of the ascending thoracic aorta, 4.3 cm maximally. Heart is normal size. Small pericardial effusion, slightly increased since prior PET CT. Mediastinum/Nodes: Right paratracheal/mediastinal conglomerate mass (likely arising from the medial right upper lobe) again noted which extends into the right hilum and subcarinal region, unchanged. Lungs/Pleura: Right upper lobe mass measures up to 2.6 cm, likely slightly decreased in size. Medial right upper lobe mass invading the mediastinum and right hilum again noted, unchanged. Small right pleural effusion, increased since prior study. No confluent opacity or effusion on the left. Musculoskeletal: Degenerative changes throughout the thoracic spine. No acute bony abnormality. CT ABDOMEN PELVIS FINDINGS Hepatobiliary: No focal hepatic abnormality. Gallbladder unremarkable. Pancreas: No focal abnormality or ductal dilatation.  Spleen: No focal abnormality.  Normal size. Adrenals/Urinary Tract: No adrenal abnormality. No focal renal abnormality. No stones or hydronephrosis. Urinary bladder is unremarkable. Stomach/Bowel: Scattered colonic diverticulosis. No active diverticulitis. No evidence of bowel obstruction. There is wall thickening within the 2nd and 3rd portions of the duodenum with mild surrounding inflammation suggesting duodenitis. Vascular/Lymphatic: Aortic atherosclerosis. No enlarged abdominal or pelvic lymph nodes. Reproductive: Mild prominence of the prostate with central calcifications. Other: No free fluid or free air. Musculoskeletal: Moderate compression deformity at L1 is stable. Degenerative changes throughout the lumbar spine. IMPRESSION: Large conglomerate  medial right upper lobe mass invading the mediastinum and right hilum again noted, unchanged. Separate 2.6 cm right upper lobe/apical rounded mass. This may have decreased slightly when compared to prior study. Small pericardial effusion and right pleural effusion, both slightly increased since prior study. Diffuse coronary artery disease, aortic atherosclerosis. 4.3 cm ascending thoracic aortic aneurysm. Recommend annual imaging followup by CTA or MRA. This recommendation follows 2010 ACCF/AHA/AATS/ACR/ASA/SCA/SCAI/SIR/STS/SVM Guidelines for the Diagnosis and Management of Patients with Thoracic Aortic Disease. Circulation. 2010; 121: S341-D622 Mild wall thickening and haziness around the 2nd and 3rd portions of the duodenum suggesting duodenitis. Abdominal aortic atherosclerosis. Prominent prostate with central calcifications. Scattered colonic diverticula.  No active diverticulitis. Electronically Signed   By: Rolm Baptise M.D.   On: 12/16/2017 22:29   Ct Abdomen Pelvis W Contrast  Result Date: 12/16/2017 CLINICAL DATA:  Lung cancer. Nausea, hemoptysis, cough. Shortness of breath. EXAM: CT CHEST, ABDOMEN, AND PELVIS WITH CONTRAST TECHNIQUE: Multidetector CT imaging of the chest, abdomen and pelvis was performed following the standard protocol during bolus administration of intravenous contrast. CONTRAST:  154mL ISOVUE-300 IOPAMIDOL (ISOVUE-300) INJECTION 61% COMPARISON:  PET CT 11/26/2017 FINDINGS: CT CHEST FINDINGS Cardiovascular: Tortuosity of the thoracic aorta with scattered aortic calcifications. Mild aneurysmal dilatation of the ascending thoracic aorta, 4.3 cm maximally. Heart is normal size. Small pericardial effusion, slightly increased since prior PET CT. Mediastinum/Nodes: Right paratracheal/mediastinal conglomerate mass (likely arising from the medial right upper lobe) again noted which extends into the right hilum and subcarinal region, unchanged. Lungs/Pleura: Right upper lobe mass measures up  to 2.6 cm, likely slightly decreased in size. Medial right upper lobe mass invading the mediastinum and right hilum again noted, unchanged. Small right pleural effusion, increased since prior study. No confluent opacity or effusion on the left. Musculoskeletal: Degenerative changes throughout the thoracic spine. No acute bony abnormality. CT ABDOMEN PELVIS FINDINGS Hepatobiliary: No focal hepatic abnormality. Gallbladder unremarkable. Pancreas: No focal abnormality or ductal dilatation. Spleen: No focal abnormality.  Normal size. Adrenals/Urinary Tract: No adrenal abnormality. No focal renal abnormality. No stones or hydronephrosis. Urinary bladder is unremarkable. Stomach/Bowel: Scattered colonic diverticulosis. No active diverticulitis. No evidence of bowel obstruction. There is wall thickening within the 2nd and 3rd portions of the duodenum with mild surrounding inflammation suggesting duodenitis. Vascular/Lymphatic: Aortic atherosclerosis. No enlarged abdominal or pelvic lymph nodes. Reproductive: Mild prominence of the prostate with central calcifications. Other: No free fluid or free air. Musculoskeletal: Moderate compression deformity at L1 is stable. Degenerative changes throughout the lumbar spine. IMPRESSION: Large conglomerate medial right upper lobe mass invading the mediastinum and right hilum again noted, unchanged. Separate 2.6 cm right upper lobe/apical rounded mass. This may have decreased slightly when compared to prior study. Small pericardial effusion and right pleural effusion, both slightly increased since prior study. Diffuse coronary artery disease, aortic atherosclerosis. 4.3 cm ascending thoracic aortic aneurysm. Recommend annual imaging followup by CTA or MRA. This recommendation follows 2010  ACCF/AHA/AATS/ACR/ASA/SCA/SCAI/SIR/STS/SVM Guidelines for the Diagnosis and Management of Patients with Thoracic Aortic Disease. Circulation. 2010; 121: H292-T090 Mild wall thickening and haziness  around the 2nd and 3rd portions of the duodenum suggesting duodenitis. Abdominal aortic atherosclerosis. Prominent prostate with central calcifications. Scattered colonic diverticula.  No active diverticulitis. Electronically Signed   By: Rolm Baptise M.D.   On: 12/16/2017 22:29   Dg Chest Port 1 View  Result Date: 12/16/2017 CLINICAL DATA:  Lung cancer, cough, nausea EXAM: PORTABLE CHEST 1 VIEW COMPARISON:  11/07/2017 FINDINGS: Right mediastinal adenopathy again visualized as described on prior PET CT. Heart is mildly enlarged. No confluent airspace opacities or effusions. Previously seen right upper lobe mass on PET CT difficult to visualize by plain film. IMPRESSION: Right mediastinal adenopathy again noted, unchanged. Mild cardiomegaly. Electronically Signed   By: Rolm Baptise M.D.   On: 12/16/2017 20:57        Scheduled Meds: . feeding supplement (ENSURE ENLIVE)  237 mL Oral BID BM  . gabapentin  200 mg Oral TID  . insulin aspart  0-9 Units Subcutaneous Q4H  . latanoprost  1 drop Both Eyes QHS  . lisinopril  10 mg Oral Daily  . pantoprazole (PROTONIX) IV  40 mg Intravenous Q12H  . umeclidinium bromide  1 puff Inhalation Daily   Continuous Infusions: . sodium chloride Stopped (12/17/17 1613)     LOS: 0 days    Time spent: over 30 min    Fayrene Helper, MD [facility] Pager [phone]  If 7PM-7AM, please contact night-coverage [email or web address] Password [email or web address] 12/18/2017, 12:05 AM

## 2017-12-18 NOTE — Progress Notes (Signed)
Initial Nutrition Assessment  DOCUMENTATION CODES:   Non-severe (moderate) malnutrition in context of chronic illness  INTERVENTION:   -Continue Ensure Enlive po BID, each supplement provides 350 kcal and 20 grams of protein -Provide Magic cup TID with meals, each supplement provides 290 kcal and 9 grams of protein  NUTRITION DIAGNOSIS:   Moderate Malnutrition related to chronic illness, cancer and cancer related treatments as evidenced by percent weight loss, energy intake < or equal to 75% for > or equal to 1 month, mild fat depletion, mild muscle depletion.  GOAL:   Patient will meet greater than or equal to 90% of their needs  MONITOR:   PO intake, Supplement acceptance, Labs, Weight trends, I & O's  REASON FOR ASSESSMENT:   Malnutrition Screening Tool    ASSESSMENT:   83 y.o. male with recently diagnosed limited stage small cell lung cancer had chemotherapy last week Thursday Friday and Saturday started developing periumbilical abdominal pain with nausea which has been progressively worsening.   Patient in room, just received his lunch tray of clears/fulls. Pt had diet coke, italian ice and grits for breakfast. Pt tried Ensure during RD visit and states he will try and drink the. States he could not drink them at home because he was too nauseated. Pt states his intakes have decreased since starting chemotherapy. However, he began to have breathing problems in October which did not help his eating habits.   Per patient UBW is 221 lb which he last weighed in April 2019. Pt has lost 20 lb since 11/21 (10% wt loss x 2 weeks, significant for time frame).   Medications: IV Zofran PRN Labs reviewed: CBGs: 94-131  NUTRITION - FOCUSED PHYSICAL EXAM:    Most Recent Value  Orbital Region  No depletion  Upper Arm Region  Moderate depletion  Thoracic and Lumbar Region  Unable to assess  Buccal Region  Mild depletion  Temple Region  No depletion  Clavicle Bone Region  Mild  depletion  Clavicle and Acromion Bone Region  Mild depletion  Scapular Bone Region  Unable to assess  Dorsal Hand  Mild depletion  Patellar Region  No depletion  Anterior Thigh Region  No depletion  Posterior Calf Region  No depletion  Edema (RD Assessment)  None       Diet Order:   Diet Order            Diet NPO time specified Except for: Sips with Meds  Diet effective midnight        Diet full liquid Room service appropriate? Yes; Fluid consistency: Thin  Diet effective now              EDUCATION NEEDS:   Education needs have been addressed  Skin:  Skin Assessment: Reviewed RN Assessment  Last BM:  12/5  Height:   Ht Readings from Last 1 Encounters:  12/16/17 5\' 10"  (1.778 m)    Weight:   Wt Readings from Last 1 Encounters:  12/16/17 81.6 kg    Ideal Body Weight:  75.5 kg  BMI:  Body mass index is 25.83 kg/m.  Estimated Nutritional Needs:   Kcal:  2200-2400  Protein:  115-125g  Fluid:  2.2L/day   Clayton Bibles, MS, RD, LDN [facility] Pager: [phone] After Hours Pager: [phone]

## 2017-12-18 NOTE — Telephone Encounter (Signed)
Attempted to call pt to let him know the consideration of switch from Eliquis to Coumadin at pt's next OV but unable to reach pt and unable to leave a message due to no machine ever kicking in.  Pt is scheduled for his next OV with Derl Barrow, NP 12/25/17.  Routing to both [place] and Lattie Haw C so they will have this as an FYI for pt's next OV.

## 2017-12-19 ENCOUNTER — Encounter (HOSPITAL_COMMUNITY): Payer: Self-pay | Admitting: Interventional Radiology

## 2017-12-19 ENCOUNTER — Observation Stay (HOSPITAL_COMMUNITY): Payer: Medicare Other

## 2017-12-19 DIAGNOSIS — J449 Chronic obstructive pulmonary disease, unspecified: Secondary | ICD-10-CM | POA: Diagnosis not present

## 2017-12-19 DIAGNOSIS — I712 Thoracic aortic aneurysm, without rupture: Secondary | ICD-10-CM | POA: Diagnosis not present

## 2017-12-19 DIAGNOSIS — I82621 Acute embolism and thrombosis of deep veins of right upper extremity: Secondary | ICD-10-CM | POA: Diagnosis not present

## 2017-12-19 DIAGNOSIS — R112 Nausea with vomiting, unspecified: Secondary | ICD-10-CM | POA: Diagnosis not present

## 2017-12-19 HISTORY — PX: IR IMAGING GUIDED PORT INSERTION: IMG5740

## 2017-12-19 LAB — GLUCOSE, CAPILLARY
GLUCOSE-CAPILLARY: 106 mg/dL — AB (ref 70–99)
Glucose-Capillary: 100 mg/dL — ABNORMAL HIGH (ref 70–99)
Glucose-Capillary: 101 mg/dL — ABNORMAL HIGH (ref 70–99)
Glucose-Capillary: 88 mg/dL (ref 70–99)
Glucose-Capillary: 99 mg/dL (ref 70–99)

## 2017-12-19 MED ORDER — LIDOCAINE-EPINEPHRINE (PF) 2 %-1:200000 IJ SOLN
INTRAMUSCULAR | Status: AC | PRN
  Administered 2017-12-19 (×2): 5 mL

## 2017-12-19 MED ORDER — FENTANYL CITRATE (PF) 100 MCG/2ML IJ SOLN
INTRAMUSCULAR | Status: AC
  Filled 2017-12-19: qty 2

## 2017-12-19 MED ORDER — PANTOPRAZOLE SODIUM 40 MG PO TBEC: 40.0000 mg | DELAYED_RELEASE_TABLET | Freq: Every day | ORAL | 0 refills | Status: DC

## 2017-12-19 MED ORDER — FENTANYL CITRATE (PF) 100 MCG/2ML IJ SOLN
INTRAMUSCULAR | Status: AC | PRN
  Administered 2017-12-19: 50 ug via INTRAVENOUS
  Administered 2017-12-19: 25 ug via INTRAVENOUS

## 2017-12-19 MED ORDER — MIDAZOLAM HCL 2 MG/2ML IJ SOLN
INTRAMUSCULAR | Status: AC
  Filled 2017-12-19: qty 2

## 2017-12-19 MED ORDER — CEFAZOLIN SODIUM-DEXTROSE 2-4 GM/100ML-% IV SOLN
2.0000 g | Freq: Once | INTRAVENOUS | Status: AC
  Administered 2017-12-19: 2 g via INTRAVENOUS

## 2017-12-19 MED ORDER — LIDOCAINE-EPINEPHRINE (PF) 2 %-1:200000 IJ SOLN
INTRAMUSCULAR | Status: AC
  Filled 2017-12-19: qty 10

## 2017-12-19 MED ORDER — MIDAZOLAM HCL 2 MG/2ML IJ SOLN
INTRAMUSCULAR | Status: AC | PRN
  Administered 2017-12-19: 1 mg via INTRAVENOUS
  Administered 2017-12-19 (×2): 0.5 mg via INTRAVENOUS

## 2017-12-19 MED ORDER — HEPARIN SOD (PORK) LOCK FLUSH 100 UNIT/ML IV SOLN
500.0000 [IU] | INTRAVENOUS | Status: AC | PRN
  Administered 2017-12-19: 500 [IU]

## 2017-12-19 MED ORDER — CEFAZOLIN SODIUM-DEXTROSE 2-4 GM/100ML-% IV SOLN
INTRAVENOUS | Status: AC
  Administered 2017-12-19: 2 g via INTRAVENOUS
  Filled 2017-12-19: qty 100

## 2017-12-19 NOTE — Discharge Summary (Signed)
Discharge Summary  Scott Gomez MOQ:[id number] DOB: Apr 23, 1934  PCP: Abner Greenspan, MD  Admit date: 12/16/2017 Discharge date: 12/19/2017  Time spent: 30 mins  Recommendations for Outpatient Follow-up:  1. PCP in 1 week 2. Oncology  Discharge Diagnoses:  Active Hospital Problems   Diagnosis Date Noted  . Intractable nausea and vomiting 12/16/2017  . Malnutrition of moderate degree 12/18/2017  . Nausea & vomiting 12/17/2017  . Small cell lung cancer, right upper lobe ([facility]) 12/04/2017  . DVT (deep venous thrombosis) ([facility]) 11/27/2017  . Aortic aneurysm ([facility]) 11/18/2017  . Essential hypertension, benign 03/29/2008  . Sleep apnea 07/08/2007  . COPD (chronic obstructive pulmonary disease) ([facility]) 07/08/2007  . Peptic ulcer 01/07/2007    Resolved Hospital Problems  No resolved problems to display.    Discharge Condition: Stable  Diet recommendation: Heart healthy  Vitals:   12/19/17 1405 12/19/17 1441  BP: 118/62   Pulse: 74   Resp: 17   Temp: 99 F (37.2 C)   SpO2: (!) 74% 96%    History of present illness:  Scott Gomez a 83 y.o.malewithrecently diagnosed limited stage small cell lung cancer had chemotherapy last week Thursday Friday and Saturday started developing periumbilical abdominal pain with nausea which has been progressively worsening. Patient states he has had previous history of peptic ulcer disease many years ago. Due to worsening abdominal pain patient came to the ER. Denies any diarrhea. Unable to eat well because of the pain. He also was recently diagnosed with possible DVT for which patient was placed on apixaban. Patient is off apixaban for Port-A-Cath placement. Pt admitted for further management.   Today, pt denies any new complaints, no longer having any nausea, vomiting, denies any abdominal pain, chest pain, SOB, fever/chills. Pt stable to be d/c with close follow up with PCP and Oncology.  Hospital Course:  Principal Problem:  Intractable nausea and vomiting Active Problems:   Essential hypertension, benign   COPD (chronic obstructive pulmonary disease) ([facility])   Peptic ulcer   Sleep apnea   Aortic aneurysm ([facility])   DVT (deep venous thrombosis) ([facility])   Small cell lung cancer, right upper lobe ([facility])   Nausea & vomiting   Malnutrition of moderate degree  1. Abdominal pain with nausea vomiting- Resolved with CAT scan showing duodenitis. Patient placed on Protonix, continue on d/c  2. Leukocytosis- Currently resolved, afebrile, ??likely due to neulasta, received on 11/30. BC X 2 NGTD  3. History of DVT- [name] on 12/4 recommends switching to coumadin, can follow up as an outpt and make switch on next outpt visit on 12/25/17 as pt cant afford apixiban. For now, continue apixaban  4. Hypertension- Continue lisinopril  5. Borderline diabetes mellitus type 2- Continue diet control management  6. Small cell lung cancer being followed by oncologist has received chemotherapy last week.  Port placed by IR on 12/19/17, ready to be used  7. Ascending thoracic aortic aneurysm 4.3 cm denies any chest pain. Will need outpatient follow-up  8. COPD not actively wheezing        Malnutrition Type:  Nutrition Problem: Moderate Malnutrition Etiology: chronic illness, cancer and cancer related treatments   Malnutrition Characteristics:  Signs/Symptoms: percent weight loss, energy intake < or equal to 75% for > or equal to 1 month, mild fat depletion, mild muscle depletion Percent weight loss: 10 %(x 2 weeks)   Nutrition Interventions:  Interventions: Ensure Enlive (each supplement provides 350kcal and 20 grams of protein), Magic cup   Estimated  body mass index is 25.83 kg/m as calculated from the following:   Height as of this encounter: _0  (1.778 m).   Weight as of this encounter: 81.6 kg.    Procedures:  Port placement on 12/19/17  Consultations:  IR  Discharge Exam: BP 118/62   Pulse  74   Temp 99 F (37.2 C) (Oral)   Resp 17   Ht _1  (1.778 m)   Wt 81.6 kg   SpO2 96%   BMI 25.83 kg/m   General: NAD Cardiovascular: S1, S2 present Respiratory: CTAB  Discharge Instructions You were cared for by a hospitalist during your hospital stay. If you have any questions about your discharge medications or the care you received while you were in the hospital after you are discharged, you can call the unit and asked to speak with the hospitalist on call if the hospitalist that took care of you is not available. Once you are discharged, your primary care physician will handle any further medical issues. Please note that NO REFILLS for any discharge medications will be authorized once you are discharged, as it is imperative that you return to your primary care physician (or establish a relationship with a primary care physician if you do not have one) for your aftercare needs so that they can reassess your need for medications and monitor your lab values.   Allergies as of 12/19/2017      Reactions   Buspirone Hcl    REACTION: itching   Crestor [rosuvastatin Calcium]    Increased his blood sugar    Prednisone    REACTION: sick, per wife he can take this.      Medication List    TAKE these medications   albuterol 108 (90 Base) MCG/ACT inhaler Commonly known as:  PROVENTIL HFA;VENTOLIN HFA Inhale 2 puffs into the lungs every 4 (four) hours as needed for wheezing or shortness of breath.   ALPRAZolam 0.5 MG tablet Commonly known as:  XANAX TAKE ONE (1) TABLET BY MOUTH TWO (2) TIMES DAILY AS NEEDED FOR ANXIETY What changed:  See the new instructions.   apixaban 5 MG Tabs tablet Commonly known as:  ELIQUIS Take 1 tablet (5 mg total) by mouth 2 (two) times daily.   cyclobenzaprine 10 MG tablet Commonly known as:  FLEXERIL Take 10 mg by mouth 2 (two) times daily.   docusate sodium 100 MG capsule Commonly known as:  COLACE Take 100 mg by mouth daily.   gabapentin 100  MG capsule Commonly known as:  NEURONTIN Take 200 mg by mouth 3 (three) times daily.   glucose blood test strip USE TO CHECK BLOOD SUGAR ONCE DAILY   levalbuterol 0.63 MG/3ML nebulizer solution Commonly known as:  XOPENEX Take 3 mLs (0.63 mg total) by nebulization every 4 (four) hours as needed for wheezing or shortness of breath.   lisinopril 10 MG tablet Commonly known as:  PRINIVIL,ZESTRIL Take 1 tablet (10 mg total) by mouth daily.   LORazepam 1 MG tablet Commonly known as:  ATIVAN Take 1 tablet (1 mg total) by mouth every 8 (eight) hours. Pt is to take 1 tablet 30 minutes prior to MRI What changed:    when to take this  additional instructions   magic mouthwash w/lidocaine Soln Take 5 mLs by mouth 3 (three) times daily as needed for mouth pain (swish and spit).   ondansetron 8 MG tablet Commonly known as:  ZOFRAN Take 1 tablet (8 mg total) by mouth every 8 (eight)  hours as needed for nausea or vomiting.   ONE TOUCH LANCETS Misc Use to test blood sugar once daily and as needed. Dx 790.29   ONETOUCH VERIO IQ SYSTEM w/Device Kit Use to test blood sugar once daily and as needed dx R73.9   Oxycodone HCl 10 MG Tabs Take 1 tablet by mouth every 6 (six) hours as needed (pain).   pantoprazole 40 MG tablet Commonly known as:  PROTONIX Take 1 tablet (40 mg total) by mouth daily.   potassium chloride SA 20 MEQ tablet Commonly known as:  K-DUR,KLOR-CON TAKE ONE (1) TABLET BY MOUTH EVERY DAY What changed:    how much to take  how to take this  when to take this  additional instructions   prochlorperazine 10 MG tablet Commonly known as:  COMPAZINE Take 1 tablet (10 mg total) by mouth every 6 (six) hours as needed for nausea or vomiting.   Tiotropium Bromide Monohydrate 2.5 MCG/ACT Aers Inhale 2 puffs into the lungs daily. What changed:    when to take this  reasons to take this   travoprost (benzalkonium) 0.004 % ophthalmic solution Commonly known as:   TRAVATAN Place 1 drop into both eyes at bedtime.   vitamin B-12 1000 MCG tablet Commonly known as:  CYANOCOBALAMIN Take 1,000 mcg by mouth daily.      Allergies  Allergen Reactions  . Buspirone Hcl     REACTION: itching  . Crestor [Rosuvastatin Calcium]     Increased his blood sugar   . Prednisone     REACTION: sick, per wife he can take this.   Follow-up Information    [name], [name], MD. Schedule an appointment as soon as possible for a visit in 1 week(s).   Specialties:  Family Medicine, Radiology Contact information: [address] [place] [address] [phone]            The results of significant diagnostics from this hospitalization (including imaging, microbiology, ancillary and laboratory) are listed below for reference.    Significant Diagnostic Studies: Ct Chest W Contrast  Result Date: 12/16/2017 CLINICAL DATA:  Lung cancer. Nausea, hemoptysis, cough. Shortness of breath. EXAM: CT CHEST, ABDOMEN, AND PELVIS WITH CONTRAST TECHNIQUE: Multidetector CT imaging of the chest, abdomen and pelvis was performed following the standard protocol during bolus administration of intravenous contrast. CONTRAST:  183m ISOVUE-300 IOPAMIDOL (ISOVUE-300) INJECTION 61% COMPARISON:  PET CT 11/26/2017 FINDINGS: CT CHEST FINDINGS Cardiovascular: Tortuosity of the thoracic aorta with scattered aortic calcifications. Mild aneurysmal dilatation of the ascending thoracic aorta, 4.3 cm maximally. Heart is normal size. Small pericardial effusion, slightly increased since prior PET CT. Mediastinum/Nodes: Right paratracheal/mediastinal conglomerate mass (likely arising from the medial right upper lobe) again noted which extends into the right hilum and subcarinal region, unchanged. Lungs/Pleura: Right upper lobe mass measures up to 2.6 cm, likely slightly decreased in size. Medial right upper lobe mass invading the mediastinum and right hilum again noted, unchanged. Small right pleural  effusion, increased since prior study. No confluent opacity or effusion on the left. Musculoskeletal: Degenerative changes throughout the thoracic spine. No acute bony abnormality. CT ABDOMEN PELVIS FINDINGS Hepatobiliary: No focal hepatic abnormality. Gallbladder unremarkable. Pancreas: No focal abnormality or ductal dilatation. Spleen: No focal abnormality.  Normal size. Adrenals/Urinary Tract: No adrenal abnormality. No focal renal abnormality. No stones or hydronephrosis. Urinary bladder is unremarkable. Stomach/Bowel: Scattered colonic diverticulosis. No active diverticulitis. No evidence of bowel obstruction. There is wall thickening within the 2nd and 3rd portions of the duodenum  with mild surrounding inflammation suggesting duodenitis. Vascular/Lymphatic: Aortic atherosclerosis. No enlarged abdominal or pelvic lymph nodes. Reproductive: Mild prominence of the prostate with central calcifications. Other: No free fluid or free air. Musculoskeletal: Moderate compression deformity at L1 is stable. Degenerative changes throughout the lumbar spine. IMPRESSION: Large conglomerate medial right upper lobe mass invading the mediastinum and right hilum again noted, unchanged. Separate 2.6 cm right upper lobe/apical rounded mass. This may have decreased slightly when compared to prior study. Small pericardial effusion and right pleural effusion, both slightly increased since prior study. Diffuse coronary artery disease, aortic atherosclerosis. 4.3 cm ascending thoracic aortic aneurysm. Recommend annual imaging followup by CTA or MRA. This recommendation follows 2010 ACCF/AHA/AATS/ACR/ASA/SCA/SCAI/SIR/STS/SVM Guidelines for the Diagnosis and Management of Patients with Thoracic Aortic Disease. Circulation. 2010; 121: Q762-U633 Mild wall thickening and haziness around the 2nd and 3rd portions of the duodenum suggesting duodenitis. Abdominal aortic atherosclerosis. Prominent prostate with central calcifications. Scattered  colonic diverticula.  No active diverticulitis. Electronically Signed   By: Rolm Baptise M.D.   On: 12/16/2017 22:29   Ct Abdomen Pelvis W Contrast  Result Date: 12/16/2017 CLINICAL DATA:  Lung cancer. Nausea, hemoptysis, cough. Shortness of breath. EXAM: CT CHEST, ABDOMEN, AND PELVIS WITH CONTRAST TECHNIQUE: Multidetector CT imaging of the chest, abdomen and pelvis was performed following the standard protocol during bolus administration of intravenous contrast. CONTRAST:  13m ISOVUE-300 IOPAMIDOL (ISOVUE-300) INJECTION 61% COMPARISON:  PET CT 11/26/2017 FINDINGS: CT CHEST FINDINGS Cardiovascular: Tortuosity of the thoracic aorta with scattered aortic calcifications. Mild aneurysmal dilatation of the ascending thoracic aorta, 4.3 cm maximally. Heart is normal size. Small pericardial effusion, slightly increased since prior PET CT. Mediastinum/Nodes: Right paratracheal/mediastinal conglomerate mass (likely arising from the medial right upper lobe) again noted which extends into the right hilum and subcarinal region, unchanged. Lungs/Pleura: Right upper lobe mass measures up to 2.6 cm, likely slightly decreased in size. Medial right upper lobe mass invading the mediastinum and right hilum again noted, unchanged. Small right pleural effusion, increased since prior study. No confluent opacity or effusion on the left. Musculoskeletal: Degenerative changes throughout the thoracic spine. No acute bony abnormality. CT ABDOMEN PELVIS FINDINGS Hepatobiliary: No focal hepatic abnormality. Gallbladder unremarkable. Pancreas: No focal abnormality or ductal dilatation. Spleen: No focal abnormality.  Normal size. Adrenals/Urinary Tract: No adrenal abnormality. No focal renal abnormality. No stones or hydronephrosis. Urinary bladder is unremarkable. Stomach/Bowel: Scattered colonic diverticulosis. No active diverticulitis. No evidence of bowel obstruction. There is wall thickening within the 2nd and 3rd portions of the  duodenum with mild surrounding inflammation suggesting duodenitis. Vascular/Lymphatic: Aortic atherosclerosis. No enlarged abdominal or pelvic lymph nodes. Reproductive: Mild prominence of the prostate with central calcifications. Other: No free fluid or free air. Musculoskeletal: Moderate compression deformity at L1 is stable. Degenerative changes throughout the lumbar spine. IMPRESSION: Large conglomerate medial right upper lobe mass invading the mediastinum and right hilum again noted, unchanged. Separate 2.6 cm right upper lobe/apical rounded mass. This may have decreased slightly when compared to prior study. Small pericardial effusion and right pleural effusion, both slightly increased since prior study. Diffuse coronary artery disease, aortic atherosclerosis. 4.3 cm ascending thoracic aortic aneurysm. Recommend annual imaging followup by CTA or MRA. This recommendation follows 2010 ACCF/AHA/AATS/ACR/ASA/SCA/SCAI/SIR/STS/SVM Guidelines for the Diagnosis and Management of Patients with Thoracic Aortic Disease. Circulation. 2010; 121: [id number] wall thickening and haziness around the 2nd and 3rd portions of the duodenum suggesting duodenitis. Abdominal aortic atherosclerosis. Prominent prostate with central calcifications. Scattered colonic diverticula.  No active diverticulitis. Electronically  Signed   By: Rolm Baptise M.D.   On: 12/16/2017 22:29   Nm Pet Image Initial (pi) Skull Base To Thigh  Result Date: 11/26/2017 CLINICAL DATA:  Initial treatment strategy for lung mass. EXAM: NUCLEAR MEDICINE PET SKULL BASE TO THIGH TECHNIQUE: 9.3 mCi F-18 FDG was injected intravenously. Full-ring PET imaging was performed from the skull base to thigh after the radiotracer. CT data was obtained and used for attenuation correction and anatomic localization. Fasting blood glucose: 136 mg/dl COMPARISON:  Chest CT 11/14/2017 FINDINGS: Mediastinal blood pool activity: SUV max 2.62 NECK: Right supraclavicular nodal  mass, recently biopsied measures a maximum of 3.7 cm on image number 45 and the SUV max is 9.99 No upper cervical adenopathy or left-sided supraclavicular adenopathy. No axillary adenopathy. Incidental CT findings: none CHEST: Large central right lung mass invading the hilum and mediastinum is markedly hypermetabolic with SUV max of 44.01. Separate 3 cm right upper lobe lung lesion has an SUV max of 14.15. Contralateral prevascular lymph node measures 12 mm and the SUV max is 6.22. No other pulmonary nodules are identified. Incidental CT findings: Advanced atherosclerotic calcifications involving the aorta and coronary arteries. ABDOMEN/PELVIS: No abnormal hypermetabolic activity within the liver, pancreas, adrenal glands, or spleen. No hypermetabolic lymph nodes in the abdomen or pelvis. Incidental CT findings: Advanced atherosclerotic calcifications involving the aorta and iliac arteries. No aneurysm. SKELETON: No focal hypermetabolic activity to suggest skeletal metastasis. Incidental CT findings: none IMPRESSION: 1. Large right central lung mass invading the mediastinum and right hilum demonstrates marked hypermetabolism consistent with neoplasm. 2. Separate 3.7 cm right apical lung mass is hypermetabolic and may be the primary tumor or a metastatic focus. 3. Right supraclavicular metastatic adenopathy as recently biopsied. 4. No PET-CT findings to suggest abdominal/pelvic metastatic disease or osseous metastatic disease. Electronically Signed   By: Marijo Sanes M.D.   On: 11/26/2017 16:59   Dg Chest Port 1 View  Result Date: 12/16/2017 CLINICAL DATA:  Lung cancer, cough, nausea EXAM: PORTABLE CHEST 1 VIEW COMPARISON:  11/07/2017 FINDINGS: Right mediastinal adenopathy again visualized as described on prior PET CT. Heart is mildly enlarged. No confluent airspace opacities or effusions. Previously seen right upper lobe mass on PET CT difficult to visualize by plain film. IMPRESSION: Right mediastinal  adenopathy again noted, unchanged. Mild cardiomegaly. Electronically Signed   By: Rolm Baptise M.D.   On: 12/16/2017 20:57   Ir Imaging Guided Port Insertion  Result Date: 12/19/2017 CLINICAL DATA:  Right upper lobe small cell lung cancer, access for chemotherapy EXAM: RIGHT INTERNAL JUGULAR SINGLE LUMEN POWER PORT CATHETER INSERTION Date:  12/19/2017 12/19/2017 11:11 am Radiologist:  Jerilynn Mages. Daryll Brod, MD Guidance:  Ultrasound and fluoroscopic MEDICATIONS: Ancef 2 g; The antibiotic was administered within an appropriate time interval prior to skin puncture. ANESTHESIA/SEDATION: Versed 2.0 mg IV; Fentanyl 75 mcg IV; Moderate Sedation Time:  29 minutes The patient was continuously monitored during the procedure by the interventional radiology nurse under my direct supervision. FLUOROSCOPY TIME:  0 minutes, 48 seconds (7.9 mGy) COMPLICATIONS: None immediate. CONTRAST:  None. PROCEDURE: Informed consent was obtained from the patient following explanation of the procedure, risks, benefits and alternatives. The patient understands, agrees and consents for the procedure. All questions were addressed. A time out was performed. Maximal barrier sterile technique utilized including caps, mask, sterile gowns, sterile gloves, large sterile drape, hand hygiene, and 2% chlorhexidine scrub. Under sterile conditions and local anesthesia, right internal jugular micropuncture venous access was performed. Access was performed with ultrasound.  Images were obtained for documentation of the patent right internal jugular vein. A guide wire was inserted followed by a transitional dilator. This allowed insertion of a guide wire and catheter into the IVC. Measurements were obtained from the SVC / RA junction back to the right IJ venotomy site. In the right infraclavicular chest, a subcutaneous pocket was created over the second anterior rib. This was done under sterile conditions and local anesthesia. 1% lidocaine with epinephrine was  utilized for this. A 2.5 cm incision was made in the skin. Blunt dissection was performed to create a subcutaneous pocket over the right pectoralis major muscle. The pocket was flushed with saline vigorously. There was adequate hemostasis. The port catheter was assembled and checked for leakage. The port catheter was secured in the pocket with two retention sutures. The tubing was tunneled subcutaneously to the right venotomy site and inserted into the SVC/RA junction through a valved peel-away sheath. Position was confirmed with fluoroscopy. Images were obtained for documentation. The patient tolerated the procedure well. No immediate complications. Incisions were closed in a two layer fashion with 4 - 0 Vicryl suture. Dermabond was applied to the skin. The port catheter was accessed, blood was aspirated followed by saline and heparin flushes. Needle was removed. A dry sterile dressing was applied. IMPRESSION: Ultrasound and fluoroscopically guided right internal jugular single lumen power port catheter insertion. Tip in the SVC/RA junction. Catheter ready for use. Electronically Signed   By: Jerilynn Mages.  Shick M.D.   On: 12/19/2017 11:32   Vas [place] Upper Extremity Venous Duplex  Result Date: 11/24/2017 UPPER VENOUS STUDY  Indications: IJ clot noted during neck biopsy for lymphadenopathy Performing Technologist: Landry Mellow RDMS, RVT  Examination Guidelines: A complete evaluation includes B-mode imaging, spectral Doppler, color Doppler, and power Doppler as needed of all accessible portions of each vessel. Bilateral testing is considered an integral part of a complete examination. Limited examinations for reoccurring indications may be performed as noted.  Right Findings: +----------+------------+----------+---------+-----------+-------+ RIGHT     CompressiblePropertiesPhasicitySpontaneousSummary +----------+------------+----------+---------+-----------+-------+ IJV           None                 Yes        Yes     Acute  +----------+------------+----------+---------+-----------+-------+ Subclavian    Full                 Yes       Yes            +----------+------------+----------+---------+-----------+-------+ Axillary      Full                 Yes       Yes            +----------+------------+----------+---------+-----------+-------+ Brachial      Full                 Yes       Yes            +----------+------------+----------+---------+-----------+-------+ Radial        Full                                          +----------+------------+----------+---------+-----------+-------+ Ulnar         Full                                          +----------+------------+----------+---------+-----------+-------+  Cephalic      Full                                          +----------+------------+----------+---------+-----------+-------+ Basilic       Full                                          +----------+------------+----------+---------+-----------+-------+  Left Findings: +----------+------------+----------+---------+-----------+-------+ LEFT      CompressiblePropertiesPhasicitySpontaneousSummary +----------+------------+----------+---------+-----------+-------+ IJV           Full                 Yes       Yes            +----------+------------+----------+---------+-----------+-------+ Subclavian                         Yes       Yes            +----------+------------+----------+---------+-----------+-------+  Summary:  Right: Findings consistent with acute deep vein thrombosis involving the right internal jugular vein.  Left: No evidence of thrombosis in the subclavian.  *See table(s) above for measurements and observations.  Diagnosing physician: Ruta Hinds MD Electronically signed by Ruta Hinds MD on 11/24/2017 at 4:22:37 PM.    Final     Microbiology: Recent Results (from the past 240 hour(s))  Culture, blood (routine x 2)      Status: None (Preliminary result)   Collection Time: 12/17/17  4:10 AM  Result Value Ref Range Status   Specimen Description   Final    BLOOD RIGHT ANTECUBITAL Performed at [facility], [place] [address], [place], [place] [address]    Special Requests   Final    BOTTLES DRAWN AEROBIC AND ANAEROBIC Blood Culture adequate volume Performed at [facility] [address], [place], [place] [address]    Culture   Final    NO GROWTH 2 DAYS Performed at [facility] [address], [place], [place] [address]    Report Status PENDING  Incomplete  Culture, blood (routine x 2)     Status: None (Preliminary result)   Collection Time: 12/17/17  4:10 AM  Result Value Ref Range Status   Specimen Description   Final    BLOOD LEFT ANTECUBITAL Performed at [facility] [address] [place], [place] [address]    Special Requests   Final    BOTTLES DRAWN AEROBIC AND ANAEROBIC Blood Culture adequate volume Performed at [facility] [address], [place], [place] [address]    Culture   Final    NO GROWTH 2 DAYS Performed at [facility] [address], [place], [place] [address]    Report Status PENDING  Incomplete     Labs: Basic Metabolic Panel: Recent Labs  Lab 12/15/17 0958 12/16/17 1805 12/17/17 0410 12/18/17 0623  NA 135 132* 135 137  K 4.0 3.2* 3.2* 3.7  CL 104 102 104 108  CO2 _0 GLUCOSE 122* 133* 118* 108*  BUN _1 CREATININE 0.88 0.78 0.66 0.60*  CALCIUM 9.2 8.9 8.3* 8.4*  MG 1.8  --   --  2.0   Liver Function Tests: Recent  Labs  Lab 12/15/17 0958 12/16/17 1805 12/17/17 0410 12/18/17 0623  AST 18 16 14* 13*  ALT _0 ALKPHOS 51 57 49 53  BILITOT 0.8 1.2 1.1 1.0  PROT 6.5 6.5 6.0* 5.7*  ALBUMIN 3.8 4.0 3.5 3.4*   Recent Labs  Lab 12/16/17 1805  LIPASE 35   No results for input(s): AMMONIA in the last 168 hours. CBC: Recent Labs  Lab  12/15/17 0958 12/16/17 1805 12/17/17 0410 12/18/17 0623  WBC 27.5* 23.8* 18.3* 8.8  NEUTROABS 24.4*  --  14.8*  --   HGB 13.0 13.3 12.4* 11.8*  HCT 38.3* 40.1 38.8* 35.9*  MCV 83.8 85.9 87.8 86.3  PLT 158 124* 104* 72*   Cardiac Enzymes: No results for input(s): CKTOTAL, CKMB, CKMBINDEX, TROPONINI in the last 168 hours. BNP: BNP (last 3 results) No results for input(s): BNP in the last 8760 hours.  ProBNP (last 3 results) No results for input(s): PROBNP in the last 8760 hours.  CBG: Recent Labs  Lab 12/19/17 0018 12/19/17 0429 12/19/17 0733 12/19/17 1205 12/19/17 1645  GLUCAP 88 100* 99 101* 106*       Signed:  Alma Friendly, MD [facility] 12/19/2017, 5:14 PM

## 2017-12-19 NOTE — Telephone Encounter (Signed)
Name of Medication: Xanax Name of Pharmacy: [facility] Written Date and Quantity: 08/18/17 #60 tabs with 3 refills Last Office Visit and Type: F/U on 11/18/17 Next Office Visit and Type: CPE on 04/17/18 Last Controlled Substance Agreement Date: 04/07/15 Last UDS:04/07/15  FYI pt is in hospital now

## 2017-12-19 NOTE — Progress Notes (Signed)
Patient has discharged to home on 12/19/17. Discharge instruction including medication and appointment was given to patient. Patient has no question at this time.

## 2017-12-22 ENCOUNTER — Telehealth: Payer: Self-pay | Admitting: *Deleted

## 2017-12-22 LAB — CULTURE, BLOOD (ROUTINE X 2)
Culture: NO GROWTH
Culture: NO GROWTH
SPECIAL REQUESTS: ADEQUATE
Special Requests: ADEQUATE

## 2017-12-22 NOTE — Telephone Encounter (Signed)
Transition Care Management Follow-up Telephone Call   Date discharged?12/19/17, spoke with wife for TCM call info/update.    How have you been since you were released from the hospital? He is trying to eat but eating gets him to coughing which causes him to spit up blood.  Hospital is aware that he continues to cough when they sent him home but wanted Dr. Glori Bickers to follow up with him this week. Per wife, overall he is better.    Do you understand why you were in the hospital? Yes   Do you understand the discharge instructions? Yes   Where were you discharged to?  Home    Items Reviewed:  Medications reviewed: Yes  Allergies reviewed: Yes  Dietary changes reviewed: on a full liquid diet currently anything he can get down.  Referrals reviewed: Yes, Sees Dr. Lisbeth Renshaw tomorrow 12/23/17.     Functional Questionnaire:   Activities of Daily Living (ADLs):   He states they are independent in the following: dressing, bathing, grooming, ambulating, toileting, feeding self.   States they require assistance with the following:    Any transportation issues/concerns?: No   Any patient concerns? No   Confirmed importance and date/time of follow-up visits scheduled Yes  Provider Appointment booked with Dr. Glori Bickers for Wed 12/24/17 at 1130am.   Confirmed with patient if condition begins to worsen call PCP or go to the ER.  Patient was given the office number and encouraged to call back with question or concerns.  : yes.

## 2017-12-22 NOTE — Telephone Encounter (Signed)
ATC patient unable to reach at this time.

## 2017-12-22 NOTE — Telephone Encounter (Signed)
Left message on answering machine at home number for patient to call back. Need to complete TCM.

## 2017-12-23 ENCOUNTER — Ambulatory Visit
Admission: RE | Admit: 2017-12-23 | Discharge: 2017-12-23 | Disposition: A | Discharge status: Home / Self Care | Payer: Medicare Other | Source: Ambulatory Visit | Attending: Radiation Oncology | Admitting: Radiation Oncology

## 2017-12-23 DIAGNOSIS — Z510 Encounter for antineoplastic radiation therapy: Secondary | ICD-10-CM | POA: Insufficient documentation

## 2017-12-23 DIAGNOSIS — C3411 Malignant neoplasm of upper lobe, right bronchus or lung: Secondary | ICD-10-CM | POA: Diagnosis present

## 2017-12-23 NOTE — Telephone Encounter (Signed)
Called patient, unable to reach. Also unable to leave voicemail as it never gave an option. Will route to BW as she sees the patient on 12.12.19 so that this could be mentioned if he is not reached by then.

## 2017-12-24 ENCOUNTER — Encounter: Payer: Self-pay | Admitting: Family Medicine

## 2017-12-24 ENCOUNTER — Ambulatory Visit (INDEPENDENT_AMBULATORY_CARE_PROVIDER_SITE_OTHER): Payer: Medicare Other | Admitting: Family Medicine

## 2017-12-24 ENCOUNTER — Telehealth: Payer: Self-pay

## 2017-12-24 VITALS — BP 116/60 | HR 93 | Temp 98.3°F | Ht 68.75 in | Wt 192.5 lb

## 2017-12-24 DIAGNOSIS — J449 Chronic obstructive pulmonary disease, unspecified: Secondary | ICD-10-CM | POA: Diagnosis not present

## 2017-12-24 DIAGNOSIS — C3490 Malignant neoplasm of unspecified part of unspecified bronchus or lung: Secondary | ICD-10-CM

## 2017-12-24 DIAGNOSIS — E440 Moderate protein-calorie malnutrition: Secondary | ICD-10-CM

## 2017-12-24 DIAGNOSIS — K2980 Duodenitis without bleeding: Secondary | ICD-10-CM

## 2017-12-24 DIAGNOSIS — I82621 Acute embolism and thrombosis of deep veins of right upper extremity: Secondary | ICD-10-CM | POA: Diagnosis not present

## 2017-12-24 DIAGNOSIS — I10 Essential (primary) hypertension: Secondary | ICD-10-CM | POA: Diagnosis not present

## 2017-12-24 DIAGNOSIS — R112 Nausea with vomiting, unspecified: Secondary | ICD-10-CM

## 2017-12-24 DIAGNOSIS — F411 Generalized anxiety disorder: Secondary | ICD-10-CM

## 2017-12-24 MED ORDER — ONDANSETRON HCL 8 MG PO TABS: 8.0000 mg | ORAL_TABLET | Freq: Three times a day (TID) | ORAL | 1 refills | Status: AC | PRN

## 2017-12-24 MED ORDER — PAROXETINE HCL 10 MG PO TABS: 10.0000 mg | ORAL_TABLET | Freq: Every day | ORAL | 11 refills | Status: AC

## 2017-12-24 MED ORDER — GLUCOSE BLOOD VI STRP: ORAL_STRIP | 3 refills | Status: AC

## 2017-12-24 MED ORDER — PANTOPRAZOLE SODIUM 40 MG PO TBEC: 40.0000 mg | DELAYED_RELEASE_TABLET | Freq: Every day | ORAL | 3 refills | Status: DC

## 2017-12-24 NOTE — Assessment & Plan Note (Signed)
No c/o  Stable through hospitalization for n/v Reviewed hospital records, lab results and studies in detail

## 2017-12-24 NOTE — Assessment & Plan Note (Signed)
Unable to afford eliquis  He sees both pulmonary and oncology tomorrow- will disc alt tx or getting samples  No doubt related to malignancy

## 2017-12-24 NOTE — Assessment & Plan Note (Signed)
Takes xanax tid  Has one time px for ativan for upcoming MRI  Struggling with cancer diag and tx lately  Disc opt for tx  Enc him to seek out counseling at the cancer center  Also px and info give -paxil 10 mg daily  inst to watch for side eff (or worse mood) and let us know  Continue xanax as ordered (on for years)

## 2017-12-24 NOTE — Assessment & Plan Note (Signed)
In midst of cancer tx /then hosp for duodenitis Much clinical improvement with protonix and zofran  Nutrition staff worked with him in the hospital Reviewed hospital records, lab results and studies in detail    Appetite is back  Ate well today  Overall feeling better

## 2017-12-24 NOTE — Telephone Encounter (Signed)
Nutrition  Patient identified on Malnutrition Screening report for weight loss and poor appetite.  Chart reviewed and noted recent diagnosis of lung cancer followed by Dr. Julien Nordmann.  Past medical history of borderline DM, dyslipidemia, osteoarthritis, back surgery, smoking.  Noted patient with recent hospital admission following chemotherapy.  Called patient and introduce self and service here at the cancer center.  Patient reports can swallow better.  Reports prior to chemotherapy was not able to eat any solid foods, barely could get liquids down for about 3 weeks.  Reports that he is eating better but wife is blending foods in blender (beef stew and potatoes).  Reports ate sausage and eggs (not blended) this am for breakfast.  Reports has never eaten more than 2 meals per day.  Is not currently drinking oral nutrition supplements.    Medications, labs reviewed  Weight noted April 2019 221 lb and 180 lb on 12/3.   19% weight loss in 8 months, significant.   Reports that he was trying to watch his weight due to elevated blood glucose.  Unsure how much weight loss is intentional.    Intervention: Discussed importance of good nutrition during cancer treatment. Encouraged oral nutrition supplements to add additional calories and protein Encouraged adding snack or noon time meal as often skips during the day Concerned with swallowing and pureeing of foods effecting nutrition.  Reports he is considering EGD and will discuss with PCP today.   Offered nutrition appointment and declined at this time.  Provided contact information to wife and encouraged to reach out if needed.    No follow-up planned at this time  Eliabeth Shoff B. Zenia Resides, [place], [place] Registered Dietitian [phone] (pager)

## 2017-12-24 NOTE — Telephone Encounter (Signed)
Called patient, unable to reach, unable to leave voicemail.

## 2017-12-24 NOTE — Assessment & Plan Note (Signed)
Has done chemo and about to start radiation Also DVT-likely related to malignancy  S/p hosp for duodenitis - tx with ppi and much clinical improvement  Pt has positive outlook and ready for the next step in tx Appetite is improved also Reviewed hospital records, lab results and studies in detail

## 2017-12-24 NOTE — Progress Notes (Signed)
Subjective:    Patient ID: Scott Gomez, male    DOB: 1934/03/02, 83 y.o.   MRN: [id number]  HPI Here for f/u of hosp from 12/3 -12/19/17 for n/v and peptic ulcer   In the setting of small cell lung cancer/rec chemotherapy   A/P scanned from d/c summary : 1. Abdominal pain with nausea vomiting- Resolved with CAT scan showing duodenitis. Patient placed on Protonix, continue on d/c  Doing better now - tolerates protonix  Nausea is much improved -needs refill on zofran   2. Leukocytosis-Currently resolved,afebrile, ??likely due to neulasta, received on 11/30. BC X 2 NGTD  3. History of DVT- [name] on 12/4recommendsswitchingto coumadin, can follow up as an outpt and make switch on next outpt visit on 12/25/17 as pt cant afford apixiban. For now, continue apixaban  4. Hypertension-Continuelisinopril  5. Borderline diabetes mellitus type 2- Continue diet control management  6. Small cell lung cancer being followed by oncologist has received chemotherapy last week. Port placed by IR on 12/19/17, ready to be used  7. Ascending thoracic aortic aneurysm 4.3 cmdenies any chest pain. Will need outpatient follow-up  8. COPD not actively wheezing  Imaging CT Abdomen Pelvis W Contrast (Accession [id number]) (Order [id number])  Imaging  Date: 12/16/2017 Department: [facility] Released By/Authorizing: Recardo Evangelist, PA-C (auto-released)  Exam Information   Status Exam Begun  Exam Ended   Final [99] 12/16/2017 9:49 PM 12/16/2017 10:12 PM  PACS Images   Show images for CT Abdomen Pelvis W Contrast  Study Result   CLINICAL DATA:  Lung cancer. Nausea, hemoptysis, cough. Shortness of breath.  EXAM: CT CHEST, ABDOMEN, AND PELVIS WITH CONTRAST  TECHNIQUE: Multidetector CT imaging of the chest, abdomen and pelvis was performed following the standard protocol during bolus administration of intravenous  contrast.  CONTRAST:  165m ISOVUE-300 IOPAMIDOL (ISOVUE-300) INJECTION 61%  COMPARISON:  PET CT 11/26/2017  FINDINGS: CT CHEST FINDINGS  Cardiovascular: Tortuosity of the thoracic aorta with scattered aortic calcifications. Mild aneurysmal dilatation of the ascending thoracic aorta, 4.3 cm maximally. Heart is normal size. Small pericardial effusion, slightly increased since prior PET CT.  Mediastinum/Nodes: Right paratracheal/mediastinal conglomerate mass (likely arising from the medial right upper lobe) again noted which extends into the right hilum and subcarinal region, unchanged.  Lungs/Pleura: Right upper lobe mass measures up to 2.6 cm, likely slightly decreased in size. Medial right upper lobe mass invading the mediastinum and right hilum again noted, unchanged. Small right pleural effusion, increased since prior study. No confluent opacity or effusion on the left.  Musculoskeletal: Degenerative changes throughout the thoracic spine. No acute bony abnormality.  CT ABDOMEN PELVIS FINDINGS  Hepatobiliary: No focal hepatic abnormality. Gallbladder unremarkable.  Pancreas: No focal abnormality or ductal dilatation.  Spleen: No focal abnormality.  Normal size.  Adrenals/Urinary Tract: No adrenal abnormality. No focal renal abnormality. No stones or hydronephrosis. Urinary bladder is unremarkable.  Stomach/Bowel: Scattered colonic diverticulosis. No active diverticulitis. No evidence of bowel obstruction. There is wall thickening within the 2nd and 3rd portions of the duodenum with mild surrounding inflammation suggesting duodenitis.  Vascular/Lymphatic: Aortic atherosclerosis. No enlarged abdominal or pelvic lymph nodes.  Reproductive: Mild prominence of the prostate with central calcifications.  Other: No free fluid or free air.  Musculoskeletal: Moderate compression deformity at L1 is stable. Degenerative changes throughout the lumbar  spine.  IMPRESSION: Large conglomerate medial right upper lobe mass invading the mediastinum and right hilum again noted, unchanged. Separate 2.6 cm  right upper lobe/apical rounded mass. This may have decreased slightly when compared to prior study.  Small pericardial effusion and right pleural effusion, both slightly increased since prior study.  Diffuse coronary artery disease, aortic atherosclerosis.  4.3 cm ascending thoracic aortic aneurysm. Recommend annual imaging followup by CTA or MRA. This recommendation follows 2010 ACCF/AHA/AATS/ACR/ASA/SCA/SCAI/SIR/STS/SVM Guidelines for the Diagnosis and Management of Patients with Thoracic Aortic Disease. Circulation. 2010; 121: O962-X528  Mild wall thickening and haziness around the 2nd and 3rd portions of the duodenum suggesting duodenitis.  Abdominal aortic atherosclerosis.  Prominent prostate with central calcifications.  Scattered colonic diverticula.  No active diverticulitis.   Electronically Signed   By: Rolm Baptise M.D.   On: 12/16/2017 22:29      Wt Readings from Last 3 Encounters:  12/24/17 192 lb 8 oz (87.3 kg)  12/16/17 180 lb (81.6 kg)  12/04/17 199 lb 14.4 oz (90.7 kg)  wt is up ? 12lb  Is able to eat  28.63 kg/m      Malnutrition Type:  Nutrition Problem: Moderate Malnutrition Etiology: chronic illness, cancer and cancer related treatments   Malnutrition Characteristics:  Signs/Symptoms: percent weight loss, energy intake < or equal to 75% for > or equal to 1 month, mild fat depletion, mild muscle depletion Percent weight loss: 10 %(x 2 weeks)   Nutrition Interventions:  Interventions: Ensure Enlive (each supplement provides 350kcal and 20 grams of protein), Magic cup   Today -symptoms   Notes hs cannot afford eliquis  Needs to change to coumadin for DVT  BP Readings from Last 3 Encounters:  12/24/17 116/60  12/19/17 118/62  12/13/17 (!) 151/67   Pulse  Readings from Last 3 Encounters:  12/24/17 93  12/19/17 74  12/13/17 66   Lab Results  Component Value Date   WBC 8.8 12/18/2017   HGB 11.8 (L) 12/18/2017   HCT 35.9 (L) 12/18/2017   MCV 86.3 12/18/2017   PLT 72 (L) 12/18/2017    Lab Results  Component Value Date   CREATININE 0.60 (L) 12/18/2017   BUN 10 12/18/2017   NA 137 12/18/2017   K 3.7 12/18/2017   CL 108 12/18/2017   CO2 23 12/18/2017  calcium 8.4 Lab Results  Component Value Date   ALT 15 12/18/2017   AST 13 (L) 12/18/2017   ALKPHOS 53 12/18/2017   BILITOT 1.0 12/18/2017    Lab Results  Component Value Date   CALCIUM 8.4 (L) 12/18/2017   CAION 1.18 07/30/2015   PHOS 3.0 02/19/2010   Lab Results  Component Value Date   INR 1.17 12/18/2017   INR 1.14 11/24/2017   INR 1.15 12/31/2010   He had an appt with NP Volanda Napoleon in pulmonary tomorrow  Also labs and onc visit with NP Curcio Sat has brain MRI scheduled (he takes one ativan before that)   Never had black stool or vomitus    Has enough eliquis to get by until tomorrow for oncol f/u   Will start radiation next  Breathing is ok   Patient Active Problem List   Diagnosis Date Noted  . Malnutrition of moderate degree 12/18/2017  . Duodenitis   . Small cell lung cancer, right upper lobe ([place]) 12/04/2017  . Encounter for antineoplastic chemotherapy 12/04/2017  . Goals of care, counseling/discussion 12/04/2017  . DVT (deep venous thrombosis) ([place]) 11/27/2017  . Aortic aneurysm ([place]) 11/18/2017  . Small cell lung cancer ([place]) 11/16/2017  . GERD (gastroesophageal reflux disease) 11/07/2017  . Routine general medical examination at  a health care facility 04/09/2015  . Osteoporosis 04/07/2015  . Constipation 05/16/2014  . Polyneuropathy 03/23/2014  . Encounter for Medicare annual wellness exam 11/25/2012  . Spinal stenosis 08/27/2011  . Vitamin B 12 deficiency 08/06/2011  . Falls frequently 05/01/2011  . Prostate cancer screening 11/07/2010  .  DEPRESSION 02/26/2010  . Essential hypertension, benign 03/29/2008  . COPD (chronic obstructive pulmonary disease) ([place]) 07/08/2007  . Sleep apnea 07/08/2007  . TESTOSTERONE DEFICIENCY 06/22/2007  . Prediabetes 01/07/2007  . Generalized anxiety disorder 01/07/2007  . CARPAL TUNNEL SYNDROME 01/07/2007  . Peptic ulcer 01/07/2007  . OSTEOARTHRITIS, HANDS, BILATERAL 01/07/2007  . SHOULDER PAIN, LEFT, CHRONIC 01/07/2007  . PALPITATIONS, HX OF 01/07/2007  . Nephrolithiasis 01/07/2007   Past Medical History:  Diagnosis Date  . Anxiety   . Arthritis    osteoarthritis both hands  . Carpal tunnel syndrome   . Diabetes mellitus    type II  . Fatigue   . Hypercholesteremia   . Hypopotassemia   . Leg cramps   . Nephrolithiasis    Hx of  . Palpitations    Hx of   . Shoulder pain, left   . Snoring   . Testosterone deficiency   . Ulcer    peptic ulcer disease   Past Surgical History:  Procedure Laterality Date  . BACK SURGERY  1978   x2 disc  . IR IMAGING GUIDED PORT INSERTION  12/19/2017   Social History   Tobacco Use  . Smoking status: Former Smoker    Packs/day: 2.00    Years: 38.00    Pack years: 76.00    Types: Cigarettes    Last attempt to quit: 01/14/1993    Years since quitting: 24.9  . Smokeless tobacco: Never Used  Substance Use Topics  . Alcohol use: No    Alcohol/week: 0.0 standard drinks  . Drug use: No   Family History  Problem Relation Age of Onset  . Cancer Brother        kidney  . Heart disease Brother        MI  . Hypertension Mother   . Allergies Mother   . Hypertension Father   . Allergies Father   . Cancer Sister   . Emphysema Sister   . Heart attack Sister    Allergies  Allergen Reactions  . Buspirone Hcl     REACTION: itching  . Crestor [Rosuvastatin Calcium]     Increased his blood sugar   . Prednisone     REACTION: sick, per wife he can take this.   Current Outpatient Medications on File Prior to Visit  Medication Sig Dispense  Refill  . albuterol (PROAIR HFA) 108 (90 Base) MCG/ACT inhaler Inhale 2 puffs into the lungs every 4 (four) hours as needed for wheezing or shortness of breath. 1 Inhaler 2  . ALPRAZolam (XANAX) 0.5 MG tablet TAKE ONE (1) TABLET BY MOUTH TWO (2) TIMES DAILY AS NEEDED FOR ANXIETY 60 tablet 3  . apixaban (ELIQUIS) 5 MG TABS tablet Take 1 tablet (5 mg total) by mouth 2 (two) times daily. 28 tablet 0  . Blood Glucose Monitoring Suppl (ONETOUCH VERIO IQ SYSTEM) w/Device KIT Use to test blood sugar once daily and as needed dx R73.9 1 kit 0  . cyclobenzaprine (FLEXERIL) 10 MG tablet Take 10 mg by mouth 2 (two) times daily.     Marland Kitchen docusate sodium (COLACE) 100 MG capsule Take 100 mg by mouth daily.    Marland Kitchen gabapentin (NEURONTIN) 100 MG  capsule Take 200 mg by mouth 3 (three) times daily.     Marland Kitchen glucose blood (ONETOUCH VERIO) test strip USE TO CHECK BLOOD SUGAR ONCE DAILY 100 each 3  . levalbuterol (XOPENEX) 0.63 MG/3ML nebulizer solution Take 3 mLs (0.63 mg total) by nebulization every 4 (four) hours as needed for wheezing or shortness of breath. 3 mL 12  . lisinopril (PRINIVIL,ZESTRIL) 10 MG tablet Take 1 tablet (10 mg total) by mouth daily. 90 tablet 3  . LORazepam (ATIVAN) 1 MG tablet Take 1 tablet (1 mg total) by mouth every 8 (eight) hours. Pt is to take 1 tablet 30 minutes prior to MRI (Patient taking differently: Take 1 mg by mouth See admin instructions. Take 1 mg by mouth 30 minutes prior to MRI) 1 tablet 0  . ONE TOUCH LANCETS MISC Use to test blood sugar once daily and as needed. Dx 790.29 200 each 1  . Oxycodone HCl 10 MG TABS Take 1 tablet by mouth every 6 (six) hours as needed (pain).     . potassium chloride SA (K-DUR,KLOR-CON) 20 MEQ tablet TAKE ONE (1) TABLET BY MOUTH EVERY DAY (Patient taking differently: Take 20 mEq by mouth daily. ) 90 tablet 3  . prochlorperazine (COMPAZINE) 10 MG tablet Take 1 tablet (10 mg total) by mouth every 6 (six) hours as needed for nausea or vomiting. 30 tablet 0  .  Tiotropium Bromide Monohydrate (SPIRIVA RESPIMAT) 2.5 MCG/ACT AERS Inhale 2 puffs into the lungs daily. (Patient taking differently: Inhale 2 puffs into the lungs daily as needed (wheezing). ) 4 g 2  . travoprost, benzalkonium, (TRAVATAN) 0.004 % ophthalmic solution Place 1 drop into both eyes at bedtime.      . vitamin B-12 (CYANOCOBALAMIN) 1000 MCG tablet Take 1,000 mcg by mouth daily.     No current facility-administered medications on file prior to visit.     Review of Systems  Constitutional: Positive for fatigue. Negative for activity change, appetite change, fever and unexpected weight change.  HENT: Negative for congestion, rhinorrhea, sore throat and trouble swallowing.   Eyes: Negative for pain, redness, itching and visual disturbance.  Respiratory: Negative for cough, chest tightness, shortness of breath and wheezing.   Cardiovascular: Negative for chest pain and palpitations.  Gastrointestinal: Positive for nausea. Negative for abdominal distention, abdominal pain, anal bleeding, blood in stool, constipation, diarrhea, rectal pain and vomiting.  Endocrine: Negative for cold intolerance, heat intolerance, polydipsia and polyuria.  Genitourinary: Negative for difficulty urinating, dysuria, flank pain, frequency and urgency.  Musculoskeletal: Negative for arthralgias, joint swelling and myalgias.  Skin: Negative for pallor and rash.  Neurological: Negative for dizziness, tremors, weakness, numbness and headaches.  Hematological: Negative for adenopathy. Does not bruise/bleed easily.  Psychiatric/Behavioral: Negative for decreased concentration, dysphoric mood, sleep disturbance and suicidal ideas. The patient is nervous/anxious. The patient is not hyperactive.        Objective:   Physical Exam  Constitutional: He appears well-developed and well-nourished. No distress.  Frail but well appearing   HENT:  Head: Normocephalic and atraumatic.  Mouth/Throat: Oropharynx is clear and  moist.  Eyes: Pupils are equal, round, and reactive to light. Conjunctivae and EOM are normal. No scleral icterus.  Neck: Normal range of motion. Neck supple.  Cardiovascular: Normal rate, regular rhythm and normal heart sounds.  No murmur heard. Pulmonary/Chest: Effort normal and breath sounds normal. No stridor. No respiratory distress. He has no wheezes. He has no rales. He exhibits no tenderness.  Diffusely distant bs   Abdominal:  Soft. Bowel sounds are normal. He exhibits no distension, no pulsatile liver, no abdominal bruit, no pulsatile midline mass and no mass. There is no hepatosplenomegaly. There is no tenderness. There is no rigidity, no rebound, no guarding and no CVA tenderness.  Lymphadenopathy:    He has no cervical adenopathy.  Neurological: He is alert. He displays normal reflexes. No cranial nerve deficit. Coordination normal.  Skin: Skin is warm and dry. No erythema. No pallor.  Psychiatric: He has a normal mood and affect.  Pleasant  Voices anxiety symptoms but not overly anxious at visit Supportive wife present           Assessment & Plan:   Problem List Items Addressed This Visit      Cardiovascular and Mediastinum   Essential hypertension, benign    bp in fair control at this time  BP Readings from Last 1 Encounters:  12/24/17 116/60   No changes needed Most recent labs reviewed  Disc lifstyle change with low sodium diet and exercise        DVT (deep venous thrombosis) ([facility])    Unable to afford eliquis  He sees both pulmonary and oncology tomorrow- will disc alt tx or getting samples  No doubt related to malignancy         Respiratory   Small cell lung cancer ([place])    Has done chemo and about to start radiation Also DVT-likely related to malignancy  S/p hosp for duodenitis - tx with ppi and much clinical improvement  Pt has positive outlook and ready for the next step in tx Appetite is improved also Reviewed hospital records, lab results and  studies in detail        Relevant Medications   ondansetron (ZOFRAN) 8 MG tablet   COPD (chronic obstructive pulmonary disease) ([place])    No c/o  Stable through hospitalization for n/v Reviewed hospital records, lab results and studies in detail          Digestive   RESOLVED: Intractable nausea and vomiting    With evidence of duodenitis on CT Reviewed hospital records, lab results and studies in detail  Much clinical improvement with ppi and prn zofran  Also in setting of cancer tx       Duodenitis - Primary    Presenting with n/v and found on CT Much improved with ppi (protonix)  No bleeding suspected  Reviewed hospital records, lab results and studies in detail    Reassuring exam today  inst to alert [place] if symptoms return/persist or worsen Continue protonix and zofran         Other   Malnutrition of moderate degree    In midst of cancer tx /then hosp for duodenitis Much clinical improvement with protonix and zofran  Nutrition staff worked with him in the hospital Reviewed hospital records, lab results and studies in detail    Appetite is back  Ate well today  Overall feeling better       Generalized anxiety disorder    Takes xanax tid  Has one time px for ativan for upcoming MRI  Struggling with cancer diag and tx lately  Disc opt for tx  Enc him to seek out counseling at the cancer center  Also px and info give -paxil 10 mg daily  inst to watch for side eff (or worse mood) and let us know  Continue xanax as ordered (on for years)      Relevant Medications   PARoxetine (PAXIL) 10 MG  tablet

## 2017-12-24 NOTE — Assessment & Plan Note (Signed)
With evidence of duodenitis on CT Reviewed hospital records, lab results and studies in detail  Much clinical improvement with ppi and prn zofran  Also in setting of cancer tx

## 2017-12-24 NOTE — Patient Instructions (Signed)
Tomorrow -talk to pulmonary and /or oncology about eliquis and other options for anticoagulation  ? Another medication like coumadin   Continue regular meals  I'm glad you are feeling better  Continue nausea medicine when you needed  Continue the generic protonix every day   Take a look at the info on paroxetine (generic paxil) for anxiety and see if you want to fill the px  Also continue the xanax

## 2017-12-24 NOTE — Assessment & Plan Note (Signed)
Presenting with n/v and found on CT Much improved with ppi (protonix)  No bleeding suspected  Reviewed hospital records, lab results and studies in detail    Reassuring exam today  inst to alert [place] if symptoms return/persist or worsen Continue protonix and zofran

## 2017-12-24 NOTE — Assessment & Plan Note (Signed)
bp in fair control at this time  BP Readings from Last 1 Encounters:  12/24/17 116/60   No changes needed Most recent labs reviewed  Disc lifstyle change with low sodium diet and exercise

## 2017-12-25 ENCOUNTER — Ambulatory Visit: Payer: Medicare Other | Admitting: Primary Care

## 2017-12-25 ENCOUNTER — Inpatient Hospital Stay (HOSPITAL_BASED_OUTPATIENT_CLINIC_OR_DEPARTMENT_OTHER): Payer: Medicare Other | Admitting: Oncology

## 2017-12-25 ENCOUNTER — Encounter: Payer: Self-pay | Admitting: Primary Care

## 2017-12-25 ENCOUNTER — Encounter: Payer: Self-pay | Admitting: Oncology

## 2017-12-25 ENCOUNTER — Inpatient Hospital Stay: Payer: Medicare Other

## 2017-12-25 VITALS — BP 129/81 | HR 98 | Temp 98.0°F | Resp 22 | Ht 68.75 in | Wt 187.6 lb

## 2017-12-25 DIAGNOSIS — C771 Secondary and unspecified malignant neoplasm of intrathoracic lymph nodes: Secondary | ICD-10-CM

## 2017-12-25 DIAGNOSIS — K2980 Duodenitis without bleeding: Secondary | ICD-10-CM | POA: Diagnosis not present

## 2017-12-25 DIAGNOSIS — C3411 Malignant neoplasm of upper lobe, right bronchus or lung: Secondary | ICD-10-CM

## 2017-12-25 DIAGNOSIS — C3490 Malignant neoplasm of unspecified part of unspecified bronchus or lung: Secondary | ICD-10-CM

## 2017-12-25 DIAGNOSIS — F40240 Claustrophobia: Secondary | ICD-10-CM | POA: Diagnosis not present

## 2017-12-25 DIAGNOSIS — R634 Abnormal weight loss: Secondary | ICD-10-CM

## 2017-12-25 DIAGNOSIS — Z5111 Encounter for antineoplastic chemotherapy: Secondary | ICD-10-CM | POA: Diagnosis not present

## 2017-12-25 DIAGNOSIS — R630 Anorexia: Secondary | ICD-10-CM

## 2017-12-25 DIAGNOSIS — E46 Unspecified protein-calorie malnutrition: Secondary | ICD-10-CM

## 2017-12-25 LAB — CBC WITH DIFFERENTIAL (CANCER CENTER ONLY)
Abs Immature Granulocytes: 0.28 10*3/uL — ABNORMAL HIGH (ref 0.00–0.07)
Basophils Absolute: 0 10*3/uL (ref 0.0–0.1)
Basophils Relative: 1 %
Eosinophils Absolute: 0 10*3/uL (ref 0.0–0.5)
Eosinophils Relative: 0 %
HCT: 32.6 % — ABNORMAL LOW (ref 39.0–52.0)
Hemoglobin: 11 g/dL — ABNORMAL LOW (ref 13.0–17.0)
Immature Granulocytes: 4 %
LYMPHS ABS: 1.3 10*3/uL (ref 0.7–4.0)
LYMPHS PCT: 21 %
MCH: 28.4 pg (ref 26.0–34.0)
MCHC: 33.7 g/dL (ref 30.0–36.0)
MCV: 84.2 fL (ref 80.0–100.0)
Monocytes Absolute: 0.7 10*3/uL (ref 0.1–1.0)
Monocytes Relative: 11 %
NEUTROS ABS: 4 10*3/uL (ref 1.7–7.7)
Neutrophils Relative %: 63 %
Platelet Count: 139 10*3/uL — ABNORMAL LOW (ref 150–400)
RBC: 3.87 MIL/uL — AB (ref 4.22–5.81)
RDW: 13.4 % (ref 11.5–15.5)
WBC Count: 6.3 10*3/uL (ref 4.0–10.5)
nRBC: 0 % (ref 0.0–0.2)

## 2017-12-25 LAB — CMP (CANCER CENTER ONLY)
ALT: 23 U/L (ref 0–44)
AST: 14 U/L — ABNORMAL LOW (ref 15–41)
Albumin: 3.4 g/dL — ABNORMAL LOW (ref 3.5–5.0)
Alkaline Phosphatase: 77 U/L (ref 38–126)
Anion gap: 9 (ref 5–15)
BUN: 9 mg/dL (ref 8–23)
CO2: 26 mmol/L (ref 22–32)
Calcium: 9.5 mg/dL (ref 8.9–10.3)
Chloride: 103 mmol/L (ref 98–111)
Creatinine: 0.81 mg/dL (ref 0.61–1.24)
Glucose, Bld: 117 mg/dL — ABNORMAL HIGH (ref 70–99)
Potassium: 3.9 mmol/L (ref 3.5–5.1)
Sodium: 138 mmol/L (ref 135–145)
Total Bilirubin: 0.3 mg/dL (ref 0.3–1.2)
Total Protein: 6.4 g/dL — ABNORMAL LOW (ref 6.5–8.1)

## 2017-12-25 LAB — MAGNESIUM: Magnesium: 1.6 mg/dL — ABNORMAL LOW (ref 1.7–2.4)

## 2017-12-25 MED ORDER — TIOTROPIUM BROMIDE MONOHYDRATE 2.5 MCG/ACT IN AERS: 2.0000 | INHALATION_SPRAY | Freq: Every day | RESPIRATORY_TRACT | 11 refills | Status: DC

## 2017-12-25 MED ORDER — MAGNESIUM OXIDE 400 (241.3 MG) MG PO TABS: 400.0000 mg | ORAL_TABLET | Freq: Every day | ORAL | 1 refills | Status: AC

## 2017-12-25 MED ORDER — TIOTROPIUM BROMIDE MONOHYDRATE 2.5 MCG/ACT IN AERS: 2.0000 | INHALATION_SPRAY | Freq: Every day | RESPIRATORY_TRACT | 2 refills | Status: DC

## 2017-12-25 NOTE — Progress Notes (Signed)
'@Patient'  ID: Scott Gomez, male    DOB: 10-04-1934, 83 y.o.   MRN: [id number]  Chief Complaint  Patient presents with  . Follow-up    little SOB with exertion, dry cough, congestion in chest but won't come up,     Referring provider: [name], [name], MD  HPI: 83 year old male, former smoker quit 1995. Follows with [name] for OSA. HX COPD, FEV1 78% in 2009 (not on inhaled maintance medication).   Seen for routine OSA visit on 10/21. Returned on 10/27 with complaints of sob and wheeze for 3 weeks. CXR showed soft tissue fullness concern for lymphadenopathy along the right side of the mediastinum. CT Chest with contrast showed infiltrative right perihilar 8.5 cm lung mass with occluded right upper lobe bronchus and narrowed right mainstem bronchus. Irregular solid 3.2 cm medial right upper lobe lung mass. CXR in Janurary 2019 showed clear lungs and normal mediastinum. Ordered for CT Chest with contrast.   Recent [facility] encounters: 11/3/2019Roma Kayser NP Patient presents today for 1 week follow-up sob/wheezing and to review CT Chest results. Accompanied by his wife. He is feeling better. Has been using Xopenex nebulizer every 4 hours and feels it's been helping. Has noticed some right neck swelling. No real difficulty swallowing, eating soft foods. He also has a sore tongue with fissure/cracks. States that his breathing prior to acute symptoms was ok. He did report experiencing shortness of breath with exertion. Denies fever, weight loss. Plan check spirometry today and ambulatory oxygen levels.   11/27/17- OV, [name] 83 year old ex-smoker following for OSA, presented with shortness of breath and "pneumonia" and was found to have a lung mass. 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   CXR showed soft tissue fullness concern for lymphadenopathy along the right side of the mediastinum. CT Chest with contrast showed infiltrative right perihilar 8.5 cm lung mass  with occluded right upper lobe bronchus and narrowed right mainstem bronchus. Irregular solid 3.2 cm medial right upper lobe lung mass. CXR in Janurary 2019 showed clear lungs and normal mediastinum.  PET Large right central lung mass invading the mediastinum and right hilum demonstrates marked hypermetabolism consistent with neoplasm. 2. Separate 3.7 cm right apical lung mass is hypermetabolic and may be the primary tumor or a metastatic focus. 3. Right supraclavicular metastatic adenopathy    Proceeded with supraclavicular lymph node biopsy, which showed small cell cancer. This also picked up a right IJ thrombus probably extending into the SVC, no thrombus in subclavian. Started on Eliquis 29m twice daily for 7 days followed by 546mtwice daily.   His main complaint is an occasional cough especially when he swallows   12/26/2017 Patient presents today for 4 week follow-up visit. Accompanied by his wife. Nex diagnosis small cell lung cancer in November 2019. He is doing fairly okay, all things considered. Reports very little shortness of breath with no wheezing. Cough is dry and has improved. He is taking robitussin as needed. He has had three rounds of chemotherapy which he appears to be tolerating ok. Plan to start radiation next week. Spoke with nutrition over the phone, declined in-patient consult. Eating better, not having trouble swallowing. He has been eating beef stew, cheese sandwiches and likes beans. They would like to stay with Eliquis if that is an option. They are both retired and on meDTE Energy Companywife states that they are maxed out on their budget with current medical expenses. No  real family support.    Testing: Spirometry 11/16/2017- FVC 2.1 (54%), FEV1 1.3 (50%), ratio 65%, FEF 25-75% 0.7 (39%). Moderate airway obstruction    Allergies  Allergen Reactions  . Buspirone Hcl     REACTION: itching  . Crestor [Rosuvastatin Calcium]     Increased his blood sugar   . Prednisone      REACTION: sick, per wife he can take this.    Immunization History  Administered Date(s) Administered  . Influenza Split 11/12/2010, 11/12/2011  . Influenza,inj,Quad PF,6+ Mos 11/11/2013, 03/17/2015, 10/13/2015, 11/19/2016, 11/18/2017  . Influenza-Unspecified 10/06/2012  . Pneumococcal Conjugate-13 03/23/2014  . Pneumococcal Polysaccharide-23 02/21/2009  . Td 08/21/2009    Past Medical History:  Diagnosis Date  . Anxiety   . Arthritis    osteoarthritis both hands  . Carpal tunnel syndrome   . Diabetes mellitus    type II  . Fatigue   . Hypercholesteremia   . Hypopotassemia   . Leg cramps   . Nephrolithiasis    Hx of  . Palpitations    Hx of   . Shoulder pain, left   . Snoring   . Testosterone deficiency   . Ulcer    peptic ulcer disease    Tobacco History: Social History   Tobacco Use  Smoking Status Former Smoker  . Packs/day: 2.00  . Years: 38.00  . Pack years: 76.00  . Types: Cigarettes  . Last attempt to quit: 01/14/1993  . Years since quitting: 24.9  Smokeless Tobacco Never Used   Counseling given: Not Answered   Outpatient Medications Prior to Visit  Medication Sig Dispense Refill  . albuterol (PROAIR HFA) 108 (90 Base) MCG/ACT inhaler Inhale 2 puffs into the lungs every 4 (four) hours as needed for wheezing or shortness of breath. 1 Inhaler 2  . ALPRAZolam (XANAX) 0.5 MG tablet TAKE ONE (1) TABLET BY MOUTH TWO (2) TIMES DAILY AS NEEDED FOR ANXIETY 60 tablet 3  . apixaban (ELIQUIS) 5 MG TABS tablet Take 1 tablet (5 mg total) by mouth 2 (two) times daily. 28 tablet 0  . Blood Glucose Monitoring Suppl (ONETOUCH VERIO IQ SYSTEM) w/Device KIT Use to test blood sugar once daily and as needed dx R73.9 1 kit 0  . cyclobenzaprine (FLEXERIL) 10 MG tablet Take 10 mg by mouth 2 (two) times daily.     Marland Kitchen docusate sodium (COLACE) 100 MG capsule Take 100 mg by mouth daily.    Marland Kitchen gabapentin (NEURONTIN) 100 MG capsule Take 200 mg by mouth 3 (three) times daily.     Marland Kitchen  glucose blood (ONETOUCH VERIO) test strip USE TO CHECK BLOOD SUGAR ONCE DAILY 100 each 3  . glucose blood test strip Use as instructed 100 each 3  . levalbuterol (XOPENEX) 0.63 MG/3ML nebulizer solution Take 3 mLs (0.63 mg total) by nebulization every 4 (four) hours as needed for wheezing or shortness of breath. 3 mL 12  . lisinopril (PRINIVIL,ZESTRIL) 10 MG tablet Take 1 tablet (10 mg total) by mouth daily. 90 tablet 3  . LORazepam (ATIVAN) 1 MG tablet Take 1 tablet (1 mg total) by mouth every 8 (eight) hours. Pt is to take 1 tablet 30 minutes prior to MRI (Patient not taking: Reported on 12/25/2017) 1 tablet 0  . ondansetron (ZOFRAN) 8 MG tablet Take 1 tablet (8 mg total) by mouth every 8 (eight) hours as needed for nausea or vomiting. 30 tablet 1  . ONE TOUCH LANCETS MISC Use to test blood sugar once daily and as  needed. Dx 790.29 200 each 1  . Oxycodone HCl 10 MG TABS Take 1 tablet by mouth every 6 (six) hours as needed (pain).     . pantoprazole (PROTONIX) 40 MG tablet Take 1 tablet (40 mg total) by mouth daily. 90 tablet 3  . PARoxetine (PAXIL) 10 MG tablet Take 1 tablet (10 mg total) by mouth daily. 30 tablet 11  . potassium chloride SA (K-DUR,KLOR-CON) 20 MEQ tablet TAKE ONE (1) TABLET BY MOUTH EVERY DAY (Patient taking differently: Take 20 mEq by mouth daily. ) 90 tablet 3  . prochlorperazine (COMPAZINE) 10 MG tablet Take 1 tablet (10 mg total) by mouth every 6 (six) hours as needed for nausea or vomiting. 30 tablet 0  . Tiotropium Bromide Monohydrate (SPIRIVA RESPIMAT) 2.5 MCG/ACT AERS Inhale 2 puffs into the lungs daily. (Patient taking differently: Inhale 2 puffs into the lungs daily as needed (wheezing). ) 4 g 2  . travoprost, benzalkonium, (TRAVATAN) 0.004 % ophthalmic solution Place 1 drop into both eyes at bedtime.      . vitamin B-12 (CYANOCOBALAMIN) 1000 MCG tablet Take 1,000 mcg by mouth daily.     No facility-administered medications prior to visit.     Review of  Systems  Review of Systems  Constitutional: Positive for unexpected weight change. Negative for chills and fever.  HENT: Negative.   Respiratory: Positive for cough. Negative for shortness of breath and wheezing.        Dry cough, improved. No hemoptysis   Cardiovascular: Negative.   Gastrointestinal: Positive for nausea. Negative for abdominal pain and vomiting.       Intermittent nausea  Psychiatric/Behavioral: Negative.     Physical Exam  BP 118/70 (BP Location: Left Arm, Cuff Size: Normal)   Pulse 96   Temp 98.4 F (36.9 C)   Ht 5' 8.75" (1.746 m)   Wt 191 lb (86.6 kg)   SpO2 99%   BMI 28.41 kg/m  Physical Exam Constitutional:      General: He is not in acute distress.    Appearance: Normal appearance. He is not toxic-appearing.     Comments: Generally appears well, some weight loss  HENT:     Head: Normocephalic and atraumatic.     Nose: Nose normal.     Mouth/Throat:     Mouth: Mucous membranes are dry.     Pharynx: Oropharynx is clear.  Eyes:     Extraocular Movements: Extraocular movements intact.     Pupils: Pupils are equal, round, and reactive to light.  Neck:     Musculoskeletal: Normal range of motion and neck supple.  Cardiovascular:     Rate and Rhythm: Normal rate.     Heart sounds: Normal heart sounds.     Comments: Irregularly regular HR 96 Pulmonary:     Effort: Pulmonary effort is normal.     Breath sounds: Normal breath sounds. No stridor. No wheezing or rhonchi.  Skin:    General: Skin is warm and dry.     Comments: Left chest wall with port-a-cath, not currently accessed. Dressing dry. No signs of infection, surrounding ecchymosis   Neurological:     General: No focal deficit present.     Mental Status: He is alert and oriented to person, place, and time.  Psychiatric:        Mood and Affect: Mood normal.        Behavior: Behavior normal.        Thought Content: Thought content normal.  Judgment: Judgment normal.      Lab  Results:  CBC    Component Value Date/Time   WBC 6.3 12/25/2017 1321   WBC 8.8 12/18/2017 0623   RBC 3.87 (L) 12/25/2017 1321   HGB 11.0 (L) 12/25/2017 1321   HCT 32.6 (L) 12/25/2017 1321   PLT 139 (L) 12/25/2017 1321   MCV 84.2 12/25/2017 1321   MCH 28.4 12/25/2017 1321   MCHC 33.7 12/25/2017 1321   RDW 13.4 12/25/2017 1321   LYMPHSABS 1.3 12/25/2017 1321   MONOABS 0.7 12/25/2017 1321   EOSABS 0.0 12/25/2017 1321   BASOSABS 0.0 12/25/2017 1321    BMET    Component Value Date/Time   NA 138 12/25/2017 1321   K 3.9 12/25/2017 1321   CL 103 12/25/2017 1321   CO2 26 12/25/2017 1321   GLUCOSE 117 (H) 12/25/2017 1321   BUN 9 12/25/2017 1321   CREATININE 0.81 12/25/2017 1321   CALCIUM 9.5 12/25/2017 1321   GFRNONAA >60 12/25/2017 1321   GFRAA >60 12/25/2017 1321    BNP No results found for: BNP  ProBNP No results found for: PROBNP  Imaging: Ct Chest W Contrast  Result Date: 12/16/2017 CLINICAL DATA:  Lung cancer. Nausea, hemoptysis, cough. Shortness of breath. EXAM: CT CHEST, ABDOMEN, AND PELVIS WITH CONTRAST TECHNIQUE: Multidetector CT imaging of the chest, abdomen and pelvis was performed following the standard protocol during bolus administration of intravenous contrast. CONTRAST:  116m ISOVUE-300 IOPAMIDOL (ISOVUE-300) INJECTION 61% COMPARISON:  PET CT 11/26/2017 FINDINGS: CT CHEST FINDINGS Cardiovascular: Tortuosity of the thoracic aorta with scattered aortic calcifications. Mild aneurysmal dilatation of the ascending thoracic aorta, 4.3 cm maximally. Heart is normal size. Small pericardial effusion, slightly increased since prior PET CT. Mediastinum/Nodes: Right paratracheal/mediastinal conglomerate mass (likely arising from the medial right upper lobe) again noted which extends into the right hilum and subcarinal region, unchanged. Lungs/Pleura: Right upper lobe mass measures up to 2.6 cm, likely slightly decreased in size. Medial right upper lobe mass invading the  mediastinum and right hilum again noted, unchanged. Small right pleural effusion, increased since prior study. No confluent opacity or effusion on the left. Musculoskeletal: Degenerative changes throughout the thoracic spine. No acute bony abnormality. CT ABDOMEN PELVIS FINDINGS Hepatobiliary: No focal hepatic abnormality. Gallbladder unremarkable. Pancreas: No focal abnormality or ductal dilatation. Spleen: No focal abnormality.  Normal size. Adrenals/Urinary Tract: No adrenal abnormality. No focal renal abnormality. No stones or hydronephrosis. Urinary bladder is unremarkable. Stomach/Bowel: Scattered colonic diverticulosis. No active diverticulitis. No evidence of bowel obstruction. There is wall thickening within the 2nd and 3rd portions of the duodenum with mild surrounding inflammation suggesting duodenitis. Vascular/Lymphatic: Aortic atherosclerosis. No enlarged abdominal or pelvic lymph nodes. Reproductive: Mild prominence of the prostate with central calcifications. Other: No free fluid or free air. Musculoskeletal: Moderate compression deformity at L1 is stable. Degenerative changes throughout the lumbar spine. IMPRESSION: Large conglomerate medial right upper lobe mass invading the mediastinum and right hilum again noted, unchanged. Separate 2.6 cm right upper lobe/apical rounded mass. This may have decreased slightly when compared to prior study. Small pericardial effusion and right pleural effusion, both slightly increased since prior study. Diffuse coronary artery disease, aortic atherosclerosis. 4.3 cm ascending thoracic aortic aneurysm. Recommend annual imaging followup by CTA or MRA. This recommendation follows 2010 ACCF/AHA/AATS/ACR/ASA/SCA/SCAI/SIR/STS/SVM Guidelines for the Diagnosis and Management of Patients with Thoracic Aortic Disease. Circulation. 2010; 121: [id number] wall thickening and haziness around the 2nd and 3rd portions of the duodenum suggesting duodenitis. Abdominal aortic  atherosclerosis.  Prominent prostate with central calcifications. Scattered colonic diverticula.  No active diverticulitis. Electronically Signed   By: Rolm Baptise M.D.   On: 12/16/2017 22:29   Ct Abdomen Pelvis W Contrast  Result Date: 12/16/2017 CLINICAL DATA:  Lung cancer. Nausea, hemoptysis, cough. Shortness of breath. EXAM: CT CHEST, ABDOMEN, AND PELVIS WITH CONTRAST TECHNIQUE: Multidetector CT imaging of the chest, abdomen and pelvis was performed following the standard protocol during bolus administration of intravenous contrast. CONTRAST:  171m ISOVUE-300 IOPAMIDOL (ISOVUE-300) INJECTION 61% COMPARISON:  PET CT 11/26/2017 FINDINGS: CT CHEST FINDINGS Cardiovascular: Tortuosity of the thoracic aorta with scattered aortic calcifications. Mild aneurysmal dilatation of the ascending thoracic aorta, 4.3 cm maximally. Heart is normal size. Small pericardial effusion, slightly increased since prior PET CT. Mediastinum/Nodes: Right paratracheal/mediastinal conglomerate mass (likely arising from the medial right upper lobe) again noted which extends into the right hilum and subcarinal region, unchanged. Lungs/Pleura: Right upper lobe mass measures up to 2.6 cm, likely slightly decreased in size. Medial right upper lobe mass invading the mediastinum and right hilum again noted, unchanged. Small right pleural effusion, increased since prior study. No confluent opacity or effusion on the left. Musculoskeletal: Degenerative changes throughout the thoracic spine. No acute bony abnormality. CT ABDOMEN PELVIS FINDINGS Hepatobiliary: No focal hepatic abnormality. Gallbladder unremarkable. Pancreas: No focal abnormality or ductal dilatation. Spleen: No focal abnormality.  Normal size. Adrenals/Urinary Tract: No adrenal abnormality. No focal renal abnormality. No stones or hydronephrosis. Urinary bladder is unremarkable. Stomach/Bowel: Scattered colonic diverticulosis. No active diverticulitis. No evidence of bowel  obstruction. There is wall thickening within the 2nd and 3rd portions of the duodenum with mild surrounding inflammation suggesting duodenitis. Vascular/Lymphatic: Aortic atherosclerosis. No enlarged abdominal or pelvic lymph nodes. Reproductive: Mild prominence of the prostate with central calcifications. Other: No free fluid or free air. Musculoskeletal: Moderate compression deformity at L1 is stable. Degenerative changes throughout the lumbar spine. IMPRESSION: Large conglomerate medial right upper lobe mass invading the mediastinum and right hilum again noted, unchanged. Separate 2.6 cm right upper lobe/apical rounded mass. This may have decreased slightly when compared to prior study. Small pericardial effusion and right pleural effusion, both slightly increased since prior study. Diffuse coronary artery disease, aortic atherosclerosis. 4.3 cm ascending thoracic aortic aneurysm. Recommend annual imaging followup by CTA or MRA. This recommendation follows 2010 ACCF/AHA/AATS/ACR/ASA/SCA/SCAI/SIR/STS/SVM Guidelines for the Diagnosis and Management of Patients with Thoracic Aortic Disease. Circulation. 2010; 121: [id number] wall thickening and haziness around the 2nd and 3rd portions of the duodenum suggesting duodenitis. Abdominal aortic atherosclerosis. Prominent prostate with central calcifications. Scattered colonic diverticula.  No active diverticulitis. Electronically Signed   By: KRolm BaptiseM.D.   On: 12/16/2017 22:29   Nm Pet Image Initial (pi) Skull Base To Thigh  Result Date: 11/26/2017 CLINICAL DATA:  Initial treatment strategy for lung mass. EXAM: NUCLEAR MEDICINE PET SKULL BASE TO THIGH TECHNIQUE: 9.3 mCi F-18 FDG was injected intravenously. Full-ring PET imaging was performed from the skull base to thigh after the radiotracer. CT data was obtained and used for attenuation correction and anatomic localization. Fasting blood glucose: 136 mg/dl COMPARISON:  Chest CT 11/14/2017 FINDINGS:  Mediastinal blood pool activity: SUV max 2.62 NECK: Right supraclavicular nodal mass, recently biopsied measures a maximum of 3.7 cm on image number 45 and the SUV max is 9.99 No upper cervical adenopathy or left-sided supraclavicular adenopathy. No axillary adenopathy. Incidental CT findings: none CHEST: Large central right lung mass invading the hilum and mediastinum is markedly hypermetabolic with SUV max of 198.26  Separate 3 cm right upper lobe lung lesion has an SUV max of 14.15. Contralateral prevascular lymph node measures 12 mm and the SUV max is 6.22. No other pulmonary nodules are identified. Incidental CT findings: Advanced atherosclerotic calcifications involving the aorta and coronary arteries. ABDOMEN/PELVIS: No abnormal hypermetabolic activity within the liver, pancreas, adrenal glands, or spleen. No hypermetabolic lymph nodes in the abdomen or pelvis. Incidental CT findings: Advanced atherosclerotic calcifications involving the aorta and iliac arteries. No aneurysm. SKELETON: No focal hypermetabolic activity to suggest skeletal metastasis. Incidental CT findings: none IMPRESSION: 1. Large right central lung mass invading the mediastinum and right hilum demonstrates marked hypermetabolism consistent with neoplasm. 2. Separate 3.7 cm right apical lung mass is hypermetabolic and may be the primary tumor or a metastatic focus. 3. Right supraclavicular metastatic adenopathy as recently biopsied. 4. No PET-CT findings to suggest abdominal/pelvic metastatic disease or osseous metastatic disease. Electronically Signed   By: Marijo Sanes M.D.   On: 11/26/2017 16:59   Dg Chest Port 1 View  Result Date: 12/16/2017 CLINICAL DATA:  Lung cancer, cough, nausea EXAM: PORTABLE CHEST 1 VIEW COMPARISON:  11/07/2017 FINDINGS: Right mediastinal adenopathy again visualized as described on prior PET CT. Heart is mildly enlarged. No confluent airspace opacities or effusions. Previously seen right upper lobe mass on PET  CT difficult to visualize by plain film. IMPRESSION: Right mediastinal adenopathy again noted, unchanged. Mild cardiomegaly. Electronically Signed   By: Rolm Baptise M.D.   On: 12/16/2017 20:57   Ir Imaging Guided Port Insertion  Result Date: 12/19/2017 CLINICAL DATA:  Right upper lobe small cell lung cancer, access for chemotherapy EXAM: RIGHT INTERNAL JUGULAR SINGLE LUMEN POWER PORT CATHETER INSERTION Date:  12/19/2017 12/19/2017 11:11 am Radiologist:  Jerilynn Mages. Daryll Brod, MD Guidance:  Ultrasound and fluoroscopic MEDICATIONS: Ancef 2 g; The antibiotic was administered within an appropriate time interval prior to skin puncture. ANESTHESIA/SEDATION: Versed 2.0 mg IV; Fentanyl 75 mcg IV; Moderate Sedation Time:  29 minutes The patient was continuously monitored during the procedure by the interventional radiology nurse under my direct supervision. FLUOROSCOPY TIME:  0 minutes, 48 seconds (7.9 mGy) COMPLICATIONS: None immediate. CONTRAST:  None. PROCEDURE: Informed consent was obtained from the patient following explanation of the procedure, risks, benefits and alternatives. The patient understands, agrees and consents for the procedure. All questions were addressed. A time out was performed. Maximal barrier sterile technique utilized including caps, mask, sterile gowns, sterile gloves, large sterile drape, hand hygiene, and 2% chlorhexidine scrub. Under sterile conditions and local anesthesia, right internal jugular micropuncture venous access was performed. Access was performed with ultrasound. Images were obtained for documentation of the patent right internal jugular vein. A guide wire was inserted followed by a transitional dilator. This allowed insertion of a guide wire and catheter into the IVC. Measurements were obtained from the SVC / RA junction back to the right IJ venotomy site. In the right infraclavicular chest, a subcutaneous pocket was created over the second anterior rib. This was done under sterile  conditions and local anesthesia. 1% lidocaine with epinephrine was utilized for this. A 2.5 cm incision was made in the skin. Blunt dissection was performed to create a subcutaneous pocket over the right pectoralis major muscle. The pocket was flushed with saline vigorously. There was adequate hemostasis. The port catheter was assembled and checked for leakage. The port catheter was secured in the pocket with two retention sutures. The tubing was tunneled subcutaneously to the right venotomy site and inserted into the SVC/RA junction  through a valved peel-away sheath. Position was confirmed with fluoroscopy. Images were obtained for documentation. The patient tolerated the procedure well. No immediate complications. Incisions were closed in a two layer fashion with 4 - 0 Vicryl suture. Dermabond was applied to the skin. The port catheter was accessed, blood was aspirated followed by saline and heparin flushes. Needle was removed. A dry sterile dressing was applied. IMPRESSION: Ultrasound and fluoroscopically guided right internal jugular single lumen power port catheter insertion. Tip in the SVC/RA junction. Catheter ready for use. Electronically Signed   By: Jerilynn Mages.  Shick M.D.   On: 12/19/2017 11:32     Assessment & Plan:   Small cell lung cancer ([place]) - Following with medical and radiation oncology - S/p 3 round chemotherapy - Plans to start radiation next week  COPD (chronic obstructive pulmonary disease) ([facility]) - Stable; no significant sob or wheezing  - Continue Spiriva daily   Duodenitis - Continues protonix daily and prn zofran   DVT (deep venous thrombosis) ([facility]) - Right IJ thrombus probably extending into the SVC, no thrombus in subclavian.  - Started on Eliquis 15m twice daily for 7 days followed by 568mtwice daily.  - Patient and his wife would like to stay on Eliquis if possible d/t frequency of labs draws and cost associated with coumadin  - Given 1 week sample of Eliquis today - We  will apply for patient assistance through [facility] Refer to case management to help assist with care mangement and medication assistance   FU in 4 weeks with Dr. AlElsworth Sohor NP   ElMartyn EhrichNP 12/26/2017

## 2017-12-25 NOTE — Telephone Encounter (Signed)
ATC patient phone just keep ringing will try to call back.

## 2017-12-25 NOTE — Telephone Encounter (Signed)
In for appt.  Relayed info.  Nothing further needed at this time.

## 2017-12-25 NOTE — Patient Instructions (Addendum)
Keep port covered with clean dressing until next apt and ask if ok open to air (dressing supplies in bag)  Blood clot: Continue Eliquis 5mg  twice daily, we will try and apply for assistance program  COPD: Continue Spiriva 2 puffs daily  (samples given)  Peptic ulcer/duodenitis: Continue Protonix  Zofran as needed for nausea  Sleep apnea: Continue to wear CPAP every night for 4-6 hours Do not drive if experiencing excessive daytime fatigue or somnolence  Nutrition recommendations: Continue eating soft solids, focus on high protein foods and encourage freq snacks Boost/ensure shakes also good source of protein and calories  Encouraged oral nutrition supplements to add additional calories and protein Encouraged adding snack or noon time meal as often skips during the day  Follow up: 2 months with [name] or sooner if develops worsening shortness of breath or cough   Referral: Ambulatory referral to case management for evaluation and medication assistance

## 2017-12-25 NOTE — Progress Notes (Signed)
[facility] OFFICE PROGRESS NOTE  [name], [name], MD [place] [address]  DIAGNOSIS: Limited stage (T4, N3, M0) small cell lung cancer presented with large perihilar mass in addition to bilateral hilar and mediastinal lymphadenopathy as well as right supraclavicular lymphadenopathy diagnosed in November 2019.  PRIOR THERAPY: None  CURRENT THERAPY: Cisplatin 80 mg meter squared on day 1 with etoposide 100 mg meter squared days 1, 2, and 3 every 3 weeks.  This will be given concurrently with radiation.  Cycle 1 started on 12/10/2017.  Radiation to begin 12/30/2017.  Status post 1 cycle.  INTERVAL HISTORY: Scott Gomez 83 y.o. male returns for a routine follow-up visit accompanied by his wife.  Since his last visit, the patient was hospitalized abdominal pain, nausea, and vomiting.  During his hospitalization he was found to have duodenitis.  He was started on Protonix and had improvement of his symptoms.  Today the patient is feeling better.  His abdominal pain has resolved.  He has not had any recurrent nausea or vomiting.  He denies fevers and chills.  Denies chest pain, shortness of breath, cough, hemoptysis.  Denies constipation or diarrhea.  He reports a decreased appetite and has lost weight since his last visit.  He is trying to eat more now that his abdominal pain has resolved and the nausea and vomiting have resolved as well.  The patient is here for evaluation and repeat lab work.  MEDICAL HISTORY: Past Medical History:  Diagnosis Date  . Anxiety   . Arthritis    osteoarthritis both hands  . Carpal tunnel syndrome   . Diabetes mellitus    type II  . Fatigue   . Hypercholesteremia   . Hypopotassemia   . Leg cramps   . Nephrolithiasis    Hx of  . Palpitations    Hx of   . Shoulder pain, left   . Snoring   . Testosterone deficiency   . Ulcer    peptic ulcer disease    ALLERGIES:  is allergic to buspirone hcl; crestor [rosuvastatin  calcium]; and prednisone.  MEDICATIONS:  Current Outpatient Medications  Medication Sig Dispense Refill  . albuterol (PROAIR HFA) 108 (90 Base) MCG/ACT inhaler Inhale 2 puffs into the lungs every 4 (four) hours as needed for wheezing or shortness of breath. 1 Inhaler 2  . ALPRAZolam (XANAX) 0.5 MG tablet TAKE ONE (1) TABLET BY MOUTH TWO (2) TIMES DAILY AS NEEDED FOR ANXIETY 60 tablet 3  . apixaban (ELIQUIS) 5 MG TABS tablet Take 1 tablet (5 mg total) by mouth 2 (two) times daily. 28 tablet 0  . Blood Glucose Monitoring Suppl (ONETOUCH VERIO IQ SYSTEM) w/Device KIT Use to test blood sugar once daily and as needed dx R73.9 1 kit 0  . cyclobenzaprine (FLEXERIL) 10 MG tablet Take 10 mg by mouth 2 (two) times daily.     Marland Kitchen docusate sodium (COLACE) 100 MG capsule Take 100 mg by mouth daily.    Marland Kitchen gabapentin (NEURONTIN) 100 MG capsule Take 200 mg by mouth 3 (three) times daily.     Marland Kitchen glucose blood (ONETOUCH VERIO) test strip USE TO CHECK BLOOD SUGAR ONCE DAILY 100 each 3  . glucose blood test strip Use as instructed 100 each 3  . levalbuterol (XOPENEX) 0.63 MG/3ML nebulizer solution Take 3 mLs (0.63 mg total) by nebulization every 4 (four) hours as needed for wheezing or shortness of breath. 3 mL 12  . lisinopril (PRINIVIL,ZESTRIL) 10  MG tablet Take 1 tablet (10 mg total) by mouth daily. 90 tablet 3  . LORazepam (ATIVAN) 1 MG tablet Take 1 tablet (1 mg total) by mouth every 8 (eight) hours. Pt is to take 1 tablet 30 minutes prior to MRI (Patient taking differently: Take 1 mg by mouth See admin instructions. Take 1 mg by mouth 30 minutes prior to MRI) 1 tablet 0  . ondansetron (ZOFRAN) 8 MG tablet Take 1 tablet (8 mg total) by mouth every 8 (eight) hours as needed for nausea or vomiting. 30 tablet 1  . ONE TOUCH LANCETS MISC Use to test blood sugar once daily and as needed. Dx 790.29 200 each 1  . Oxycodone HCl 10 MG TABS Take 1 tablet by mouth every 6 (six) hours as needed (pain).     . pantoprazole  (PROTONIX) 40 MG tablet Take 1 tablet (40 mg total) by mouth daily. 90 tablet 3  . PARoxetine (PAXIL) 10 MG tablet Take 1 tablet (10 mg total) by mouth daily. 30 tablet 11  . potassium chloride SA (K-DUR,KLOR-CON) 20 MEQ tablet TAKE ONE (1) TABLET BY MOUTH EVERY DAY (Patient taking differently: Take 20 mEq by mouth daily. ) 90 tablet 3  . prochlorperazine (COMPAZINE) 10 MG tablet Take 1 tablet (10 mg total) by mouth every 6 (six) hours as needed for nausea or vomiting. 30 tablet 0  . Tiotropium Bromide Monohydrate (SPIRIVA RESPIMAT) 2.5 MCG/ACT AERS Inhale 2 puffs into the lungs daily. (Patient taking differently: Inhale 2 puffs into the lungs daily as needed (wheezing). ) 4 g 2  . Tiotropium Bromide Monohydrate (SPIRIVA RESPIMAT) 2.5 MCG/ACT AERS Inhale 2 puffs into the lungs daily. 4 g 2  . Tiotropium Bromide Monohydrate (SPIRIVA RESPIMAT) 2.5 MCG/ACT AERS Inhale 2 puffs into the lungs daily. 4 g 11  . travoprost, benzalkonium, (TRAVATAN) 0.004 % ophthalmic solution Place 1 drop into both eyes at bedtime.      . vitamin B-12 (CYANOCOBALAMIN) 1000 MCG tablet Take 1,000 mcg by mouth daily.     No current facility-administered medications for this visit.     SURGICAL HISTORY:  Past Surgical History:  Procedure Laterality Date  . BACK SURGERY  1978   x2 disc  . IR IMAGING GUIDED PORT INSERTION  12/19/2017    REVIEW OF SYSTEMS:   Review of Systems  Constitutional: Negative for chills, fatigue, fever.  Positive for weight loss. HENT:   Negative for mouth sores, nosebleeds, sore throat and trouble swallowing.   Eyes: Negative for eye problems and icterus.  Respiratory: Negative for cough, hemoptysis, shortness of breath and wheezing.   Cardiovascular: Negative for chest pain and leg swelling.  Gastrointestinal: Negative for abdominal pain, constipation, diarrhea, nausea and vomiting.  Genitourinary: Negative for bladder incontinence, difficulty urinating, dysuria, frequency and hematuria.    Musculoskeletal: Negative for back pain, gait problem, neck pain and neck stiffness.  Skin: Negative for itching and rash.  Neurological: Negative for dizziness, extremity weakness, gait problem, headaches, light-headedness and seizures.  Hematological: Negative for adenopathy. Does not bruise/bleed easily.  Psychiatric/Behavioral: Negative for confusion, depression and sleep disturbance. The patient is not nervous/anxious.     PHYSICAL EXAMINATION:  Blood pressure 129/81, pulse 98, temperature 98 F (36.7 C), temperature source Oral, resp. rate (!) 22, height 5' 8.75" (1.746 m), weight 187 lb 9.6 oz (85.1 kg), SpO2 97 %.  ECOG PERFORMANCE STATUS: 1 - Symptomatic but completely ambulatory  Physical Exam  Constitutional: Oriented to person, place, and time and well-developed, well-nourished, and  in no distress. No distress.  HENT:  Head: Normocephalic and atraumatic.  Mouth/Throat: Oropharynx is clear and moist. No oropharyngeal exudate.  Eyes: Conjunctivae are normal. Right eye exhibits no discharge. Left eye exhibits no discharge. No scleral icterus.  Neck: Normal range of motion. Neck supple.  Cardiovascular: Normal rate, regular rhythm, normal heart sounds and intact distal pulses.   Pulmonary/Chest: Effort normal and breath sounds normal. No respiratory distress. No wheezes. No rales.  Abdominal: Soft. Bowel sounds are normal. Exhibits no distension and no mass. There is no tenderness.  Musculoskeletal: Normal range of motion. Exhibits no edema.  Lymphadenopathy:    No cervical adenopathy.  Neurological: Alert and oriented to person, place, and time. Exhibits normal muscle tone. Gait normal. Coordination normal.  Skin: Skin is warm and dry. No rash noted. Not diaphoretic. No erythema. No pallor.  Psychiatric: Mood, memory and judgment normal.  Vitals reviewed.  LABORATORY DATA: Lab Results  Component Value Date   WBC 6.3 12/25/2017   HGB 11.0 (L) 12/25/2017   HCT 32.6 (L)  12/25/2017   MCV 84.2 12/25/2017   PLT 139 (L) 12/25/2017      Chemistry      Component Value Date/Time   NA 138 12/25/2017 1321   K 3.9 12/25/2017 1321   CL 103 12/25/2017 1321   CO2 26 12/25/2017 1321   BUN 9 12/25/2017 1321   CREATININE 0.81 12/25/2017 1321      Component Value Date/Time   CALCIUM 9.5 12/25/2017 1321   ALKPHOS 77 12/25/2017 1321   AST 14 (L) 12/25/2017 1321   ALT 23 12/25/2017 1321   BILITOT 0.3 12/25/2017 1321       RADIOGRAPHIC STUDIES:  Ct Chest W Contrast  Result Date: 12/16/2017 CLINICAL DATA:  Lung cancer. Nausea, hemoptysis, cough. Shortness of breath. EXAM: CT CHEST, ABDOMEN, AND PELVIS WITH CONTRAST TECHNIQUE: Multidetector CT imaging of the chest, abdomen and pelvis was performed following the standard protocol during bolus administration of intravenous contrast. CONTRAST:  137m ISOVUE-300 IOPAMIDOL (ISOVUE-300) INJECTION 61% COMPARISON:  PET CT 11/26/2017 FINDINGS: CT CHEST FINDINGS Cardiovascular: Tortuosity of the thoracic aorta with scattered aortic calcifications. Mild aneurysmal dilatation of the ascending thoracic aorta, 4.3 cm maximally. Heart is normal size. Small pericardial effusion, slightly increased since prior PET CT. Mediastinum/Nodes: Right paratracheal/mediastinal conglomerate mass (likely arising from the medial right upper lobe) again noted which extends into the right hilum and subcarinal region, unchanged. Lungs/Pleura: Right upper lobe mass measures up to 2.6 cm, likely slightly decreased in size. Medial right upper lobe mass invading the mediastinum and right hilum again noted, unchanged. Small right pleural effusion, increased since prior study. No confluent opacity or effusion on the left. Musculoskeletal: Degenerative changes throughout the thoracic spine. No acute bony abnormality. CT ABDOMEN PELVIS FINDINGS Hepatobiliary: No focal hepatic abnormality. Gallbladder unremarkable. Pancreas: No focal abnormality or ductal dilatation.  Spleen: No focal abnormality.  Normal size. Adrenals/Urinary Tract: No adrenal abnormality. No focal renal abnormality. No stones or hydronephrosis. Urinary bladder is unremarkable. Stomach/Bowel: Scattered colonic diverticulosis. No active diverticulitis. No evidence of bowel obstruction. There is wall thickening within the 2nd and 3rd portions of the duodenum with mild surrounding inflammation suggesting duodenitis. Vascular/Lymphatic: Aortic atherosclerosis. No enlarged abdominal or pelvic lymph nodes. Reproductive: Mild prominence of the prostate with central calcifications. Other: No free fluid or free air. Musculoskeletal: Moderate compression deformity at L1 is stable. Degenerative changes throughout the lumbar spine. IMPRESSION: Large conglomerate medial right upper lobe mass invading the mediastinum and right hilum  again noted, unchanged. Separate 2.6 cm right upper lobe/apical rounded mass. This may have decreased slightly when compared to prior study. Small pericardial effusion and right pleural effusion, both slightly increased since prior study. Diffuse coronary artery disease, aortic atherosclerosis. 4.3 cm ascending thoracic aortic aneurysm. Recommend annual imaging followup by CTA or MRA. This recommendation follows 2010 ACCF/AHA/AATS/ACR/ASA/SCA/SCAI/SIR/STS/SVM Guidelines for the Diagnosis and Management of Patients with Thoracic Aortic Disease. Circulation. 2010; 121: C947-S962 Mild wall thickening and haziness around the 2nd and 3rd portions of the duodenum suggesting duodenitis. Abdominal aortic atherosclerosis. Prominent prostate with central calcifications. Scattered colonic diverticula.  No active diverticulitis. Electronically Signed   By: Rolm Baptise M.D.   On: 12/16/2017 22:29   Ct Abdomen Pelvis W Contrast  Result Date: 12/16/2017 CLINICAL DATA:  Lung cancer. Nausea, hemoptysis, cough. Shortness of breath. EXAM: CT CHEST, ABDOMEN, AND PELVIS WITH CONTRAST TECHNIQUE: Multidetector CT  imaging of the chest, abdomen and pelvis was performed following the standard protocol during bolus administration of intravenous contrast. CONTRAST:  184m ISOVUE-300 IOPAMIDOL (ISOVUE-300) INJECTION 61% COMPARISON:  PET CT 11/26/2017 FINDINGS: CT CHEST FINDINGS Cardiovascular: Tortuosity of the thoracic aorta with scattered aortic calcifications. Mild aneurysmal dilatation of the ascending thoracic aorta, 4.3 cm maximally. Heart is normal size. Small pericardial effusion, slightly increased since prior PET CT. Mediastinum/Nodes: Right paratracheal/mediastinal conglomerate mass (likely arising from the medial right upper lobe) again noted which extends into the right hilum and subcarinal region, unchanged. Lungs/Pleura: Right upper lobe mass measures up to 2.6 cm, likely slightly decreased in size. Medial right upper lobe mass invading the mediastinum and right hilum again noted, unchanged. Small right pleural effusion, increased since prior study. No confluent opacity or effusion on the left. Musculoskeletal: Degenerative changes throughout the thoracic spine. No acute bony abnormality. CT ABDOMEN PELVIS FINDINGS Hepatobiliary: No focal hepatic abnormality. Gallbladder unremarkable. Pancreas: No focal abnormality or ductal dilatation. Spleen: No focal abnormality.  Normal size. Adrenals/Urinary Tract: No adrenal abnormality. No focal renal abnormality. No stones or hydronephrosis. Urinary bladder is unremarkable. Stomach/Bowel: Scattered colonic diverticulosis. No active diverticulitis. No evidence of bowel obstruction. There is wall thickening within the 2nd and 3rd portions of the duodenum with mild surrounding inflammation suggesting duodenitis. Vascular/Lymphatic: Aortic atherosclerosis. No enlarged abdominal or pelvic lymph nodes. Reproductive: Mild prominence of the prostate with central calcifications. Other: No free fluid or free air. Musculoskeletal: Moderate compression deformity at L1 is stable.  Degenerative changes throughout the lumbar spine. IMPRESSION: Large conglomerate medial right upper lobe mass invading the mediastinum and right hilum again noted, unchanged. Separate 2.6 cm right upper lobe/apical rounded mass. This may have decreased slightly when compared to prior study. Small pericardial effusion and right pleural effusion, both slightly increased since prior study. Diffuse coronary artery disease, aortic atherosclerosis. 4.3 cm ascending thoracic aortic aneurysm. Recommend annual imaging followup by CTA or MRA. This recommendation follows 2010 ACCF/AHA/AATS/ACR/ASA/SCA/SCAI/SIR/STS/SVM Guidelines for the Diagnosis and Management of Patients with Thoracic Aortic Disease. Circulation. 2010; 121: [id number] wall thickening and haziness around the 2nd and 3rd portions of the duodenum suggesting duodenitis. Abdominal aortic atherosclerosis. Prominent prostate with central calcifications. Scattered colonic diverticula.  No active diverticulitis. Electronically Signed   By: KRolm BaptiseM.D.   On: 12/16/2017 22:29   Nm Pet Image Initial (pi) Skull Base To Thigh  Result Date: 11/26/2017 CLINICAL DATA:  Initial treatment strategy for lung mass. EXAM: NUCLEAR MEDICINE PET SKULL BASE TO THIGH TECHNIQUE: 9.3 mCi F-18 FDG was injected intravenously. Full-ring PET imaging was performed from the  skull base to thigh after the radiotracer. CT data was obtained and used for attenuation correction and anatomic localization. Fasting blood glucose: 136 mg/dl COMPARISON:  Chest CT 11/14/2017 FINDINGS: Mediastinal blood pool activity: SUV max 2.62 NECK: Right supraclavicular nodal mass, recently biopsied measures a maximum of 3.7 cm on image number 45 and the SUV max is 9.99 No upper cervical adenopathy or left-sided supraclavicular adenopathy. No axillary adenopathy. Incidental CT findings: none CHEST: Large central right lung mass invading the hilum and mediastinum is markedly hypermetabolic with SUV max  of 12.35. Separate 3 cm right upper lobe lung lesion has an SUV max of 14.15. Contralateral prevascular lymph node measures 12 mm and the SUV max is 6.22. No other pulmonary nodules are identified. Incidental CT findings: Advanced atherosclerotic calcifications involving the aorta and coronary arteries. ABDOMEN/PELVIS: No abnormal hypermetabolic activity within the liver, pancreas, adrenal glands, or spleen. No hypermetabolic lymph nodes in the abdomen or pelvis. Incidental CT findings: Advanced atherosclerotic calcifications involving the aorta and iliac arteries. No aneurysm. SKELETON: No focal hypermetabolic activity to suggest skeletal metastasis. Incidental CT findings: none IMPRESSION: 1. Large right central lung mass invading the mediastinum and right hilum demonstrates marked hypermetabolism consistent with neoplasm. 2. Separate 3.7 cm right apical lung mass is hypermetabolic and may be the primary tumor or a metastatic focus. 3. Right supraclavicular metastatic adenopathy as recently biopsied. 4. No PET-CT findings to suggest abdominal/pelvic metastatic disease or osseous metastatic disease. Electronically Signed   By: Marijo Sanes M.D.   On: 11/26/2017 16:59   Dg Chest Port 1 View  Result Date: 12/16/2017 CLINICAL DATA:  Lung cancer, cough, nausea EXAM: PORTABLE CHEST 1 VIEW COMPARISON:  11/07/2017 FINDINGS: Right mediastinal adenopathy again visualized as described on prior PET CT. Heart is mildly enlarged. No confluent airspace opacities or effusions. Previously seen right upper lobe mass on PET CT difficult to visualize by plain film. IMPRESSION: Right mediastinal adenopathy again noted, unchanged. Mild cardiomegaly. Electronically Signed   By: Rolm Baptise M.D.   On: 12/16/2017 20:57   Ir Imaging Guided Port Insertion  Result Date: 12/19/2017 CLINICAL DATA:  Right upper lobe small cell lung cancer, access for chemotherapy EXAM: RIGHT INTERNAL JUGULAR SINGLE LUMEN POWER PORT CATHETER INSERTION  Date:  12/19/2017 12/19/2017 11:11 am Radiologist:  Jerilynn Mages. Daryll Brod, MD Guidance:  Ultrasound and fluoroscopic MEDICATIONS: Ancef 2 g; The antibiotic was administered within an appropriate time interval prior to skin puncture. ANESTHESIA/SEDATION: Versed 2.0 mg IV; Fentanyl 75 mcg IV; Moderate Sedation Time:  29 minutes The patient was continuously monitored during the procedure by the interventional radiology nurse under my direct supervision. FLUOROSCOPY TIME:  0 minutes, 48 seconds (7.9 mGy) COMPLICATIONS: None immediate. CONTRAST:  None. PROCEDURE: Informed consent was obtained from the patient following explanation of the procedure, risks, benefits and alternatives. The patient understands, agrees and consents for the procedure. All questions were addressed. A time out was performed. Maximal barrier sterile technique utilized including caps, mask, sterile gowns, sterile gloves, large sterile drape, hand hygiene, and 2% chlorhexidine scrub. Under sterile conditions and local anesthesia, right internal jugular micropuncture venous access was performed. Access was performed with ultrasound. Images were obtained for documentation of the patent right internal jugular vein. A guide wire was inserted followed by a transitional dilator. This allowed insertion of a guide wire and catheter into the IVC. Measurements were obtained from the SVC / RA junction back to the right IJ venotomy site. In the right infraclavicular chest, a subcutaneous pocket was created  over the second anterior rib. This was done under sterile conditions and local anesthesia. 1% lidocaine with epinephrine was utilized for this. A 2.5 cm incision was made in the skin. Blunt dissection was performed to create a subcutaneous pocket over the right pectoralis major muscle. The pocket was flushed with saline vigorously. There was adequate hemostasis. The port catheter was assembled and checked for leakage. The port catheter was secured in the pocket with  two retention sutures. The tubing was tunneled subcutaneously to the right venotomy site and inserted into the SVC/RA junction through a valved peel-away sheath. Position was confirmed with fluoroscopy. Images were obtained for documentation. The patient tolerated the procedure well. No immediate complications. Incisions were closed in a two layer fashion with 4 - 0 Vicryl suture. Dermabond was applied to the skin. The port catheter was accessed, blood was aspirated followed by saline and heparin flushes. Needle was removed. A dry sterile dressing was applied. IMPRESSION: Ultrasound and fluoroscopically guided right internal jugular single lumen power port catheter insertion. Tip in the SVC/RA junction. Catheter ready for use. Electronically Signed   By: Jerilynn Mages.  Shick M.D.   On: 12/19/2017 11:32     ASSESSMENT/PLAN:  Small cell lung cancer [facility]) This is a very pleasant 83 year old white male recently diagnosed with limited stage (T4, N3, M0) small cell lung cancer presented with large perihilar mass in addition to bilateral hilar and mediastinal lymphadenopathy as well as right supraclavicular lymphadenopathy diagnosed in November 2019. MRI of the brain is still pending.  This is scheduled in the next few days.  He has have an open MRI due to claustrophobia. The patient is currently on systemic chemotherapy with cisplatin 80 mg/m day 1 with etoposide 100 mg meter squared on days 1, 2, and 3.  He is status post 1 cycle.  He tolerated the first cycle well overall with the exception of nausea and vomiting which have now resolved.  He was hospitalized for duodenitis which was likely not related to his chemotherapy.  Labs from today been reviewed.  They remain stable with the exception of a low magnesium.  A prescription for magnesium oxide 400 mg daily was sent to his pharmacy.  We will follow his magnesium level on a weekly basis.  Recommend for him to return next week for evaluation prior to cycle #2 of his  chemotherapy.  He will need a restaging CT scan following cycle 2 of his chemotherapy.  This will need to be ordered at his next visit.  For the weight loss, I have referred him to the dietitian. He will begin radiation as previously scheduled.  He was advised to call immediately if he has any concerning symptoms in the interval.  The patient voices understanding of current disease status and treatment options and is in agreement with the current care plan.  All questions were answered. The patient knows to call the clinic with any problems, questions or concerns. We can certainly see the patient much sooner if necessary.   Orders Placed This Encounter  Procedures  . Amb Referral to Nutrition and Diabetic E    Referral Priority:   Routine    Referral Type:   Consultation    Referral Reason:   Specialty Services Required    Number of Visits Requested:   Rew, DNP, AGPCNP-BC, AOCNP 12/25/17

## 2017-12-26 ENCOUNTER — Telehealth: Payer: Self-pay | Admitting: Oncology

## 2017-12-26 ENCOUNTER — Encounter: Payer: Self-pay | Admitting: Primary Care

## 2017-12-26 DIAGNOSIS — E46 Unspecified protein-calorie malnutrition: Secondary | ICD-10-CM | POA: Insufficient documentation

## 2017-12-26 NOTE — Assessment & Plan Note (Addendum)
-   Continues protonix daily and prn zofran

## 2017-12-26 NOTE — Assessment & Plan Note (Addendum)
-   Right IJ thrombus probably extending into the SVC, no thrombus in subclavian.  - Started on Eliquis 10mg  twice daily for 7 days followed by 5mg  twice daily.  - Patient and his wife would like to stay on Eliquis if possible d/t frequency of labs draws and cost associated with coumadin  - Given 1 week sample of Eliquis today - We will apply for patient assistance through [place], if not approved for Eliquis assistance then will switch to coumadin to follow with coumadin clinic  - Refer to case management to help assist with care mangement and medication assistance

## 2017-12-26 NOTE — Assessment & Plan Note (Addendum)
This is a very pleasant 83 year old white male recently diagnosed with limited stage (T4, N3, M0) small cell lung cancer presented with large perihilar mass in addition to bilateral hilar and mediastinal lymphadenopathy as well as right supraclavicular lymphadenopathy diagnosed in November 2019. MRI of the brain is still pending.  This is scheduled in the next few days.  He has have an open MRI due to claustrophobia. The patient is currently on systemic chemotherapy with cisplatin 80 mg/m day 1 with etoposide 100 mg meter squared on days 1, 2, and 3.  He is status post 1 cycle.  He tolerated the first cycle well overall with the exception of nausea and vomiting which have now resolved.  He was hospitalized for duodenitis which was likely not related to his chemotherapy.  Labs from today been reviewed.  They remain stable with the exception of a low magnesium.  A prescription for magnesium oxide 400 mg daily was sent to his pharmacy.  We will follow his magnesium level on a weekly basis.  Recommend for him to return next week for evaluation prior to cycle #2 of his chemotherapy.  He will need a restaging CT scan following cycle 2 of his chemotherapy.  This will need to be ordered at his next visit.  For the weight loss, I have referred him to the dietitian. He will begin radiation as previously scheduled.  He was advised to call immediately if he has any concerning symptoms in the interval.  The patient voices understanding of current disease status and treatment options and is in agreement with the current care plan.  All questions were answered. The patient knows to call the clinic with any problems, questions or concerns. We can certainly see the patient much sooner if necessary.

## 2017-12-26 NOTE — Assessment & Plan Note (Addendum)
-   Stable; no significant sob or wheezing  - Continue Spiriva daily

## 2017-12-26 NOTE — Telephone Encounter (Signed)
Called patient regarding voicemail message that was left stating he needed appts changed to 10 am or after.  Was not able to reach patient.  Called twice, phone busy.

## 2017-12-26 NOTE — Assessment & Plan Note (Addendum)
-   Following with medical and radiation oncology - Receiving chemotherapy, s/p 3 rounds. Right port-a-cath placed on 12/6.  - He had CT simulation done on 12/10, plan to start radiation next week  - MRI brain scheduled for 12/14

## 2017-12-27 ENCOUNTER — Ambulatory Visit
Admission: RE | Admit: 2017-12-27 | Discharge: 2017-12-27 | Disposition: A | Discharge status: Home / Self Care | Payer: Medicare Other | Source: Ambulatory Visit | Attending: Radiation Oncology | Admitting: Radiation Oncology

## 2017-12-27 ENCOUNTER — Other Ambulatory Visit: Payer: Self-pay | Admitting: Radiation Oncology

## 2017-12-27 DIAGNOSIS — C3490 Malignant neoplasm of unspecified part of unspecified bronchus or lung: Secondary | ICD-10-CM

## 2017-12-27 MED ORDER — GADOBENATE DIMEGLUMINE 529 MG/ML IV SOLN
17.0000 mL | Freq: Once | INTRAVENOUS | Status: AC | PRN
  Administered 2017-12-27: 17 mL via INTRAVENOUS

## 2017-12-29 ENCOUNTER — Other Ambulatory Visit: Payer: Self-pay | Admitting: Internal Medicine

## 2017-12-30 ENCOUNTER — Inpatient Hospital Stay: Payer: Medicare Other

## 2017-12-30 ENCOUNTER — Inpatient Hospital Stay (HOSPITAL_BASED_OUTPATIENT_CLINIC_OR_DEPARTMENT_OTHER): Payer: Medicare Other | Admitting: Internal Medicine

## 2017-12-30 ENCOUNTER — Encounter: Payer: Self-pay | Admitting: Medical

## 2017-12-30 ENCOUNTER — Ambulatory Visit
Admission: RE | Admit: 2017-12-30 | Discharge: 2017-12-30 | Disposition: A | Discharge status: Home / Self Care | Payer: Medicare Other | Source: Ambulatory Visit | Attending: Radiation Oncology | Admitting: Radiation Oncology

## 2017-12-30 DIAGNOSIS — C3411 Malignant neoplasm of upper lobe, right bronchus or lung: Secondary | ICD-10-CM

## 2017-12-30 DIAGNOSIS — Z5111 Encounter for antineoplastic chemotherapy: Secondary | ICD-10-CM

## 2017-12-30 DIAGNOSIS — C3490 Malignant neoplasm of unspecified part of unspecified bronchus or lung: Secondary | ICD-10-CM

## 2017-12-30 LAB — CBC WITH DIFFERENTIAL (CANCER CENTER ONLY)
Abs Immature Granulocytes: 0.75 10*3/uL — ABNORMAL HIGH (ref 0.00–0.07)
Basophils Absolute: 0.1 10*3/uL (ref 0.0–0.1)
Basophils Relative: 1 %
Eosinophils Absolute: 0 10*3/uL (ref 0.0–0.5)
Eosinophils Relative: 0 %
HCT: 32.4 % — ABNORMAL LOW (ref 39.0–52.0)
Hemoglobin: 10.6 g/dL — ABNORMAL LOW (ref 13.0–17.0)
Immature Granulocytes: 8 %
Lymphocytes Relative: 14 %
Lymphs Abs: 1.4 10*3/uL (ref 0.7–4.0)
MCH: 28.2 pg (ref 26.0–34.0)
MCHC: 32.7 g/dL (ref 30.0–36.0)
MCV: 86.2 fL (ref 80.0–100.0)
Monocytes Absolute: 0.9 10*3/uL (ref 0.1–1.0)
Monocytes Relative: 9 %
Neutro Abs: 6.8 10*3/uL (ref 1.7–7.7)
Neutrophils Relative %: 68 %
Platelet Count: 358 10*3/uL (ref 150–400)
RBC: 3.76 MIL/uL — ABNORMAL LOW (ref 4.22–5.81)
RDW: 14 % (ref 11.5–15.5)
WBC Count: 9.9 10*3/uL (ref 4.0–10.5)
nRBC: 0 % (ref 0.0–0.2)

## 2017-12-30 LAB — MAGNESIUM: Magnesium: 1.9 mg/dL (ref 1.7–2.4)

## 2017-12-30 LAB — CMP (CANCER CENTER ONLY)
ALT: 12 U/L (ref 0–44)
AST: 11 U/L — ABNORMAL LOW (ref 15–41)
Albumin: 3.3 g/dL — ABNORMAL LOW (ref 3.5–5.0)
Alkaline Phosphatase: 77 U/L (ref 38–126)
Anion gap: 11 (ref 5–15)
BILIRUBIN TOTAL: 0.3 mg/dL (ref 0.3–1.2)
BUN: 8 mg/dL (ref 8–23)
CO2: 23 mmol/L (ref 22–32)
Calcium: 8.8 mg/dL — ABNORMAL LOW (ref 8.9–10.3)
Chloride: 104 mmol/L (ref 98–111)
Creatinine: 0.83 mg/dL (ref 0.61–1.24)
GFR, Est AFR Am: >60 mL/min (ref >60)
GFR, Estimated: >60 mL/min (ref >60)
Glucose, Bld: 194 mg/dL — ABNORMAL HIGH (ref 70–99)
POTASSIUM: 3.7 mmol/L (ref 3.5–5.1)
Sodium: 138 mmol/L (ref 135–145)
TOTAL PROTEIN: 6.4 g/dL — AB (ref 6.5–8.1)

## 2017-12-30 MED ORDER — HEPARIN SOD (PORK) LOCK FLUSH 100 UNIT/ML IV SOLN
500.0000 [IU] | Freq: Once | INTRAVENOUS | Status: AC
  Administered 2017-12-30: 500 [IU] via INTRAVENOUS
  Filled 2017-12-30: qty 5

## 2017-12-30 MED ORDER — SODIUM CHLORIDE 0.9% FLUSH
10.0000 mL | Freq: Once | INTRAVENOUS | Status: AC
  Administered 2017-12-30: 10 mL via INTRAVENOUS
  Filled 2017-12-30: qty 10

## 2017-12-30 NOTE — Progress Notes (Signed)
[facility] Telephone:([phone]) [phone]   [phone]  OFFICE PROGRESS NOTE  [name], [name], MD [place] [address]  DIAGNOSIS: Limited stage [phone], M0)small cell lung cancer presented with large perihilar mass in addition to bilateral hilar and mediastinal lymphadenopathy as well as right supraclavicular lymphadenopathy diagnosed in November 2019.  PRIOR THERAPY: None  CURRENT THERAPY: Cisplatin 80 mg meter squared on day 1 with etoposide 100 mg meter squared days 1, 2, and 3 every 3 weeks.  This will be given concurrently with radiation.  Cycle 1 started on 12/10/2017.  Radiation to begin 12/30/2017.  Status post 1 cycle.  INTERVAL HISTORY: Scott Gomez 83 y.o. male returns to the clinic today for follow-up visit accompanied by his wife and daughter.  The patient is feeling fine today with no concerning complaints.  He tolerated the first cycle of his treatment with cisplatin and etoposide fairly well except for fatigue and few episodes of nausea and vomiting.  He was admitted to [facility] for abdominal pain as well as nausea and vomiting and the patient received IV hydration and felt much better.  He denied having any other significant complaints in the last few days.  He has no recent weight loss or night sweats.  He has no nausea, vomiting, diarrhea or constipation.  He denied having any chest pain and noticed that his breathing is getting much better.  He has no cough or hemoptysis.  He is here today for evaluation before starting cycle #2 of his treatment tomorrow.  He is expected to start his radiotherapy today.  He also had MRI of the brain performed recently that showed suspicious finding for early brain metastasis.  MEDICAL HISTORY: Past Medical History:  Diagnosis Date  . Anxiety   . Arthritis    osteoarthritis both hands  . Carpal tunnel syndrome   . Diabetes mellitus    type II  . Fatigue   .  Hypercholesteremia   . Hypopotassemia   . Leg cramps   . Nephrolithiasis    Hx of  . Palpitations    Hx of   . Shoulder pain, left   . Snoring   . Testosterone deficiency   . Ulcer    peptic ulcer disease    ALLERGIES:  is allergic to buspirone hcl; crestor [rosuvastatin calcium]; and prednisone.  MEDICATIONS:  Current Outpatient Medications  Medication Sig Dispense Refill  . albuterol (PROAIR HFA) 108 (90 Base) MCG/ACT inhaler Inhale 2 puffs into the lungs every 4 (four) hours as needed for wheezing or shortness of breath. 1 Inhaler 2  . ALPRAZolam (XANAX) 0.5 MG tablet TAKE ONE (1) TABLET BY MOUTH TWO (2) TIMES DAILY AS NEEDED FOR ANXIETY 60 tablet 3  . apixaban (ELIQUIS) 5 MG TABS tablet Take 1 tablet (5 mg total) by mouth 2 (two) times daily. 28 tablet 0  . Blood Glucose Monitoring Suppl (ONETOUCH VERIO IQ SYSTEM) w/Device KIT Use to test blood sugar once daily and as needed dx R73.9 1 kit 0  . cyclobenzaprine (FLEXERIL) 10 MG tablet Take 10 mg by mouth 2 (two) times daily.     Marland Kitchen docusate sodium (COLACE) 100 MG capsule Take 100 mg by mouth daily.    Marland Kitchen gabapentin (NEURONTIN) 100 MG capsule Take 200 mg by mouth 3 (three) times daily.     Marland Kitchen glucose blood (ONETOUCH VERIO) test strip USE TO CHECK BLOOD SUGAR ONCE DAILY 100 each 3  .  glucose blood test strip Use as instructed 100 each 3  . levalbuterol (XOPENEX) 0.63 MG/3ML nebulizer solution Take 3 mLs (0.63 mg total) by nebulization every 4 (four) hours as needed for wheezing or shortness of breath. 3 mL 12  . lisinopril (PRINIVIL,ZESTRIL) 10 MG tablet Take 1 tablet (10 mg total) by mouth daily. 90 tablet 3  . magnesium oxide (MAG-OX) 400 (241.3 Mg) MG tablet Take 1 tablet (400 mg total) by mouth daily. 30 tablet 1  . ondansetron (ZOFRAN) 8 MG tablet Take 1 tablet (8 mg total) by mouth every 8 (eight) hours as needed for nausea or vomiting. 30 tablet 1  . ONE TOUCH LANCETS MISC Use to test blood sugar once daily and as needed. Dx  790.29 200 each 1  . Oxycodone HCl 10 MG TABS Take 1 tablet by mouth every 6 (six) hours as needed (pain).     . pantoprazole (PROTONIX) 40 MG tablet Take 1 tablet (40 mg total) by mouth daily. 90 tablet 3  . PARoxetine (PAXIL) 10 MG tablet Take 1 tablet (10 mg total) by mouth daily. 30 tablet 11  . potassium chloride SA (K-DUR,KLOR-CON) 20 MEQ tablet TAKE ONE (1) TABLET BY MOUTH EVERY DAY (Patient taking differently: Take 20 mEq by mouth daily. ) 90 tablet 3  . prochlorperazine (COMPAZINE) 10 MG tablet TAKE 1 TABLET (10 MG TOTAL) BY MOUTH EVERY 6 (SIX) HOURS AS NEEDED FOR NAUSEA OR VOMITING. 30 tablet 0  . Tiotropium Bromide Monohydrate (SPIRIVA RESPIMAT) 2.5 MCG/ACT AERS Inhale 2 puffs into the lungs daily. (Patient taking differently: Inhale 2 puffs into the lungs daily as needed (wheezing). ) 4 g 2  . travoprost, benzalkonium, (TRAVATAN) 0.004 % ophthalmic solution Place 1 drop into both eyes at bedtime.      . vitamin B-12 (CYANOCOBALAMIN) 1000 MCG tablet Take 1,000 mcg by mouth daily.     No current facility-administered medications for this visit.     SURGICAL HISTORY:  Past Surgical History:  Procedure Laterality Date  . BACK SURGERY  1978   x2 disc  . IR IMAGING GUIDED PORT INSERTION  12/19/2017    REVIEW OF SYSTEMS:  A comprehensive review of systems was negative except for: Constitutional: positive for fatigue   PHYSICAL EXAMINATION: General appearance: alert, cooperative, fatigued and no distress Head: Normocephalic, without obvious abnormality, atraumatic Neck: no adenopathy, no JVD, supple, symmetrical, trachea midline and thyroid not enlarged, symmetric, no tenderness/mass/nodules Lymph nodes: Cervical, supraclavicular, and axillary nodes normal. Resp: clear to auscultation bilaterally Back: symmetric, no curvature. ROM normal. No CVA tenderness. Cardio: regular rate and rhythm, S1, S2 normal, no murmur, click, rub or gallop GI: soft, non-tender; bowel sounds normal; no  masses,  no organomegaly Extremities: extremities normal, atraumatic, no cyanosis or edema  ECOG PERFORMANCE STATUS: 1 - Symptomatic but completely ambulatory  There were no vitals taken for this visit.  LABORATORY DATA: Lab Results  Component Value Date   WBC 9.9 12/30/2017   HGB 10.6 (L) 12/30/2017   HCT 32.4 (L) 12/30/2017   MCV 86.2 12/30/2017   PLT 358 12/30/2017      Chemistry      Component Value Date/Time   NA 138 12/30/2017 0928   K 3.7 12/30/2017 0928   CL 104 12/30/2017 0928   CO2 23 12/30/2017 0928   BUN 8 12/30/2017 0928   CREATININE 0.83 12/30/2017 0928      Component Value Date/Time   CALCIUM 8.8 (L) 12/30/2017 0928   ALKPHOS 77 12/30/2017 2979  AST 11 (L) 12/30/2017 0928   ALT 12 12/30/2017 0928   BILITOT 0.3 12/30/2017 0928       RADIOGRAPHIC STUDIES: Ct Chest W Contrast  Result Date: 12/16/2017 CLINICAL DATA:  Lung cancer. Nausea, hemoptysis, cough. Shortness of breath. EXAM: CT CHEST, ABDOMEN, AND PELVIS WITH CONTRAST TECHNIQUE: Multidetector CT imaging of the chest, abdomen and pelvis was performed following the standard protocol during bolus administration of intravenous contrast. CONTRAST:  129m ISOVUE-300 IOPAMIDOL (ISOVUE-300) INJECTION 61% COMPARISON:  PET CT 11/26/2017 FINDINGS: CT CHEST FINDINGS Cardiovascular: Tortuosity of the thoracic aorta with scattered aortic calcifications. Mild aneurysmal dilatation of the ascending thoracic aorta, 4.3 cm maximally. Heart is normal size. Small pericardial effusion, slightly increased since prior PET CT. Mediastinum/Nodes: Right paratracheal/mediastinal conglomerate mass (likely arising from the medial right upper lobe) again noted which extends into the right hilum and subcarinal region, unchanged. Lungs/Pleura: Right upper lobe mass measures up to 2.6 cm, likely slightly decreased in size. Medial right upper lobe mass invading the mediastinum and right hilum again noted, unchanged. Small right pleural  effusion, increased since prior study. No confluent opacity or effusion on the left. Musculoskeletal: Degenerative changes throughout the thoracic spine. No acute bony abnormality. CT ABDOMEN PELVIS FINDINGS Hepatobiliary: No focal hepatic abnormality. Gallbladder unremarkable. Pancreas: No focal abnormality or ductal dilatation. Spleen: No focal abnormality.  Normal size. Adrenals/Urinary Tract: No adrenal abnormality. No focal renal abnormality. No stones or hydronephrosis. Urinary bladder is unremarkable. Stomach/Bowel: Scattered colonic diverticulosis. No active diverticulitis. No evidence of bowel obstruction. There is wall thickening within the 2nd and 3rd portions of the duodenum with mild surrounding inflammation suggesting duodenitis. Vascular/Lymphatic: Aortic atherosclerosis. No enlarged abdominal or pelvic lymph nodes. Reproductive: Mild prominence of the prostate with central calcifications. Other: No free fluid or free air. Musculoskeletal: Moderate compression deformity at L1 is stable. Degenerative changes throughout the lumbar spine. IMPRESSION: Large conglomerate medial right upper lobe mass invading the mediastinum and right hilum again noted, unchanged. Separate 2.6 cm right upper lobe/apical rounded mass. This may have decreased slightly when compared to prior study. Small pericardial effusion and right pleural effusion, both slightly increased since prior study. Diffuse coronary artery disease, aortic atherosclerosis. 4.3 cm ascending thoracic aortic aneurysm. Recommend annual imaging followup by CTA or MRA. This recommendation follows 2010 ACCF/AHA/AATS/ACR/ASA/SCA/SCAI/SIR/STS/SVM Guidelines for the Diagnosis and Management of Patients with Thoracic Aortic Disease. Circulation. 2010; 121: [id number] wall thickening and haziness around the 2nd and 3rd portions of the duodenum suggesting duodenitis. Abdominal aortic atherosclerosis. Prominent prostate with central calcifications. Scattered  colonic diverticula.  No active diverticulitis. Electronically Signed   By: KRolm BaptiseM.D.   On: 12/16/2017 22:29   Mr BJeri CosWEVContrast  Result Date: 12/27/2017 CLINICAL DATA:  83year old male with small cell lung cancer. Staging. Nausea and difficulty walking. EXAM: MRI HEAD WITHOUT AND WITH CONTRAST TECHNIQUE: Multiplanar, multiecho pulse sequences of the brain and surrounding structures were obtained without and with intravenous contrast. CONTRAST:  143mMULTIHANCE GADOBENATE DIMEGLUMINE 529 MG/ML IV SOLN COMPARISON:  Face CT 07/30/2015.  Brain MRI 01/01/2011. FINDINGS: Brain: There is a small but suspicious 3 millimeter rounded area of enhancement in the left periventricular white matter on series 13, image 94 and series 15, image 16. And there is a 2nd similar small but suspicious focus of enhancement in the lower right cerebellum along the lateral aspect of the vermis on series 13, image 31. No associated edema or mass effect at the sites. No other abnormal parenchymal enhancement identified. No  dural thickening. No restricted diffusion to suggest acute infarction. No midline shift, mass effect, ventriculomegaly, extra-axial collection or acute intracranial hemorrhage. Cervicomedullary junction and pituitary are within normal limits. No chronic cerebral blood products or cortical encephalomalacia identified. Scattered small but occasionally patchy cerebral white matter T2 and FLAIR hyperintensity in both hemispheres is similar to that in 2012. Vascular: Major intracranial vascular flow voids are stable since 2012. the major dural venous sinuses are enhancing and appear to be patent. Skull and upper cervical spine: Visualized bone marrow signal is within normal limits. Sinuses/Orbits: Stable and negative. Other: Mastoid air cells are clear. Visible internal auditory structures appear normal. Occasional small cervical lymph nodes to do not appear enlarged by CT criteria. Scalp and face soft tissues  appear negative. IMPRESSION: 1. 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 No associated edema or mass effect. 2. No other acute intracranial abnormality. Chronic cerebral white matter signal changes which are probably small vessel disease related. Electronically Signed   By: Genevie Ann M.D.   On: 12/27/2017 20:01   Ct Abdomen Pelvis W Contrast  Result Date: 12/16/2017 CLINICAL DATA:  Lung cancer. Nausea, hemoptysis, cough. Shortness of breath. EXAM: CT CHEST, ABDOMEN, AND PELVIS WITH CONTRAST TECHNIQUE: Multidetector CT imaging of the chest, abdomen and pelvis was performed following the standard protocol during bolus administration of intravenous contrast. CONTRAST:  148m ISOVUE-300 IOPAMIDOL (ISOVUE-300) INJECTION 61% COMPARISON:  PET CT 11/26/2017 FINDINGS: CT CHEST FINDINGS Cardiovascular: Tortuosity of the thoracic aorta with scattered aortic calcifications. Mild aneurysmal dilatation of the ascending thoracic aorta, 4.3 cm maximally. Heart is normal size. Small pericardial effusion, slightly increased since prior PET CT. Mediastinum/Nodes: Right paratracheal/mediastinal conglomerate mass (likely arising from the medial right upper lobe) again noted which extends into the right hilum and subcarinal region, unchanged. Lungs/Pleura: Right upper lobe mass measures up to 2.6 cm, likely slightly decreased in size. Medial right upper lobe mass invading the mediastinum and right hilum again noted, unchanged. Small right pleural effusion, increased since prior study. No confluent opacity or effusion on the left. Musculoskeletal: Degenerative changes throughout the thoracic spine. No acute bony abnormality. CT ABDOMEN PELVIS FINDINGS Hepatobiliary: No focal hepatic abnormality. Gallbladder unremarkable. Pancreas: No focal abnormality or ductal  dilatation. Spleen: No focal abnormality.  Normal size. Adrenals/Urinary Tract: No adrenal abnormality. No focal renal abnormality. No stones or hydronephrosis. Urinary bladder is unremarkable. Stomach/Bowel: Scattered colonic diverticulosis. No active diverticulitis. No evidence of bowel obstruction. There is wall thickening within the 2nd and 3rd portions of the duodenum with mild surrounding inflammation suggesting duodenitis. Vascular/Lymphatic: Aortic atherosclerosis. No enlarged abdominal or pelvic lymph nodes. Reproductive: Mild prominence of the prostate with central calcifications. Other: No free fluid or free air. Musculoskeletal: Moderate compression deformity at L1 is stable. Degenerative changes throughout the lumbar spine. IMPRESSION: Large conglomerate medial right upper lobe mass invading the mediastinum and right hilum again noted, unchanged. Separate 2.6 cm right upper lobe/apical rounded mass. This may have decreased slightly when compared to prior study. Small pericardial effusion and right pleural effusion, both slightly increased since prior study. Diffuse coronary artery disease, aortic atherosclerosis. 4.3 cm ascending thoracic aortic aneurysm. Recommend annual imaging followup by CTA or MRA. This recommendation follows 2010 ACCF/AHA/AATS/ACR/ASA/SCA/SCAI/SIR/STS/SVM Guidelines for the Diagnosis and Management of Patients with Thoracic Aortic Disease. Circulation. 2010; 121: [id number] wall thickening and haziness around  the 2nd and 3rd portions of the duodenum suggesting duodenitis. Abdominal aortic atherosclerosis. Prominent prostate with central calcifications. Scattered colonic diverticula.  No active diverticulitis. Electronically Signed   By: Rolm Baptise M.D.   On: 12/16/2017 22:29   Dg Chest Port 1 View  Result Date: 12/16/2017 CLINICAL DATA:  Lung cancer, cough, nausea EXAM: PORTABLE CHEST 1 VIEW COMPARISON:  11/07/2017 FINDINGS: Right mediastinal adenopathy again visualized  as described on prior PET CT. Heart is mildly enlarged. No confluent airspace opacities or effusions. Previously seen right upper lobe mass on PET CT difficult to visualize by plain film. IMPRESSION: Right mediastinal adenopathy again noted, unchanged. Mild cardiomegaly. Electronically Signed   By: Rolm Baptise M.D.   On: 12/16/2017 20:57   Ir Imaging Guided Port Insertion  Result Date: 12/19/2017 CLINICAL DATA:  Right upper lobe small cell lung cancer, access for chemotherapy EXAM: RIGHT INTERNAL JUGULAR SINGLE LUMEN POWER PORT CATHETER INSERTION Date:  12/19/2017 12/19/2017 11:11 am Radiologist:  Jerilynn Mages. Daryll Brod, MD Guidance:  Ultrasound and fluoroscopic MEDICATIONS: Ancef 2 g; The antibiotic was administered within an appropriate time interval prior to skin puncture. ANESTHESIA/SEDATION: Versed 2.0 mg IV; Fentanyl 75 mcg IV; Moderate Sedation Time:  29 minutes The patient was continuously monitored during the procedure by the interventional radiology nurse under my direct supervision. FLUOROSCOPY TIME:  0 minutes, 48 seconds (7.9 mGy) COMPLICATIONS: None immediate. CONTRAST:  None. PROCEDURE: Informed consent was obtained from the patient following explanation of the procedure, risks, benefits and alternatives. The patient understands, agrees and consents for the procedure. All questions were addressed. A time out was performed. Maximal barrier sterile technique utilized including caps, mask, sterile gowns, sterile gloves, large sterile drape, hand hygiene, and 2% chlorhexidine scrub. Under sterile conditions and local anesthesia, right internal jugular micropuncture venous access was performed. Access was performed with ultrasound. Images were obtained for documentation of the patent right internal jugular vein. A guide wire was inserted followed by a transitional dilator. This allowed insertion of a guide wire and catheter into the IVC. Measurements were obtained from the SVC / RA junction back to the right  IJ venotomy site. In the right infraclavicular chest, a subcutaneous pocket was created over the second anterior rib. This was done under sterile conditions and local anesthesia. 1% lidocaine with epinephrine was utilized for this. A 2.5 cm incision was made in the skin. Blunt dissection was performed to create a subcutaneous pocket over the right pectoralis major muscle. The pocket was flushed with saline vigorously. There was adequate hemostasis. The port catheter was assembled and checked for leakage. The port catheter was secured in the pocket with two retention sutures. The tubing was tunneled subcutaneously to the right venotomy site and inserted into the SVC/RA junction through a valved peel-away sheath. Position was confirmed with fluoroscopy. Images were obtained for documentation. The patient tolerated the procedure well. No immediate complications. Incisions were closed in a two layer fashion with 4 - 0 Vicryl suture. Dermabond was applied to the skin. The port catheter was accessed, blood was aspirated followed by saline and heparin flushes. Needle was removed. A dry sterile dressing was applied. IMPRESSION: Ultrasound and fluoroscopically guided right internal jugular single lumen power port catheter insertion. Tip in the SVC/RA junction. Catheter ready for use. Electronically Signed   By: Jerilynn Mages.  Shick M.D.   On: 12/19/2017 11:32    ASSESSMENT AND PLAN: This is a very pleasant 83 years old white male recently diagnosed with limited stage small cell lung cancer  presented with large perihilar mass in addition to bilateral hilar and mediastinal lymphadenopathy.  The patient was also found on recent MRI of the brain to have suspicious early metastatic disease to the brain which can make his final staging as extensive stage disease. I discussed the MRI results with the patient and his family. He was a started on systemic chemotherapy with cisplatin and etoposide status post 1 cycle and tolerated the first  cycle well. I recommended for the patient to proceed with cycle #2 today as scheduled.  His treatment will be concurrent with radiotherapy. Regarding the suspicious brain metastasis, we will continue to monitor this closely and repeat imaging studies after few cycles of the treatment and if he has any evidence of progressive disease in the brain he would benefit from whole brain irradiation. I will see the patient back for follow-up visit in 3 weeks for evaluation after repeating CT scan of the chest for restaging of his disease. The patient was advised to call immediately if he has any concerning symptoms in the interval. The patient voices understanding of current disease status and treatment options and is in agreement with the current care plan.  All questions were answered. The patient knows to call the clinic with any problems, questions or concerns. We can certainly see the patient much sooner if necessary.  I spent 10 minutes counseling the patient face to face. The total time spent in the appointment was 15 minutes.  Disclaimer: This note was dictated with voice recognition software. Similar sounding words can inadvertently be transcribed and may not be corrected upon review.

## 2017-12-31 ENCOUNTER — Ambulatory Visit
Admission: RE | Admit: 2017-12-31 | Discharge: 2017-12-31 | Disposition: A | Discharge status: Home / Self Care | Payer: Medicare Other | Source: Ambulatory Visit | Attending: Radiation Oncology | Admitting: Radiation Oncology

## 2017-12-31 ENCOUNTER — Ambulatory Visit: Payer: Medicare Other | Admitting: Medical

## 2017-12-31 ENCOUNTER — Inpatient Hospital Stay (HOSPITAL_BASED_OUTPATIENT_CLINIC_OR_DEPARTMENT_OTHER): Payer: Medicare Other | Admitting: Medical

## 2017-12-31 ENCOUNTER — Inpatient Hospital Stay: Payer: Medicare Other

## 2017-12-31 ENCOUNTER — Other Ambulatory Visit: Payer: Medicare Other

## 2017-12-31 ENCOUNTER — Telehealth: Payer: Self-pay | Admitting: Internal Medicine

## 2017-12-31 VITALS — BP 127/74 | HR 87 | Temp 97.6°F | Resp 17

## 2017-12-31 DIAGNOSIS — Z5111 Encounter for antineoplastic chemotherapy: Secondary | ICD-10-CM | POA: Diagnosis not present

## 2017-12-31 DIAGNOSIS — T8090XA Unspecified complication following infusion and therapeutic injection, initial encounter: Secondary | ICD-10-CM | POA: Diagnosis not present

## 2017-12-31 DIAGNOSIS — F419 Anxiety disorder, unspecified: Secondary | ICD-10-CM | POA: Diagnosis not present

## 2017-12-31 DIAGNOSIS — C3411 Malignant neoplasm of upper lobe, right bronchus or lung: Secondary | ICD-10-CM

## 2017-12-31 MED ORDER — HEPARIN SOD (PORK) LOCK FLUSH 100 UNIT/ML IV SOLN
500.0000 [IU] | Freq: Once | INTRAVENOUS | Status: AC | PRN
  Administered 2017-12-31: 500 [IU]
  Filled 2017-12-31: qty 5

## 2017-12-31 MED ORDER — POTASSIUM CHLORIDE 2 MEQ/ML IV SOLN
Freq: Once | INTRAVENOUS | Status: AC
  Administered 2017-12-31: 08:00:00 via INTRAVENOUS
  Filled 2017-12-31: qty 10

## 2017-12-31 MED ORDER — LORAZEPAM 2 MG/ML IJ SOLN
0.5000 mg | Freq: Once | INTRAMUSCULAR | Status: AC
  Administered 2017-12-31: 0.5 mg via INTRAVENOUS

## 2017-12-31 MED ORDER — SODIUM CHLORIDE 0.9% FLUSH
10.0000 mL | INTRAVENOUS | Status: DC | PRN
  Administered 2017-12-31: 10 mL
  Filled 2017-12-31: qty 10

## 2017-12-31 MED ORDER — FAMOTIDINE IN NACL 20-0.9 MG/50ML-% IV SOLN
20.0000 mg | Freq: Once | INTRAVENOUS | Status: AC
  Administered 2017-12-31: 20 mg via INTRAVENOUS

## 2017-12-31 MED ORDER — DEXAMETHASONE SODIUM PHOSPHATE 10 MG/ML IJ SOLN
INTRAMUSCULAR | Status: AC
  Filled 2017-12-31: qty 1

## 2017-12-31 MED ORDER — SODIUM CHLORIDE 0.9 % IV SOLN
Freq: Once | INTRAVENOUS | Status: AC
  Administered 2017-12-31: 10:00:00 via INTRAVENOUS
  Filled 2017-12-31: qty 5

## 2017-12-31 MED ORDER — SODIUM CHLORIDE 0.9 % IV SOLN
95.0000 mg/m2 | Freq: Once | INTRAVENOUS | Status: AC
  Administered 2017-12-31: 200 mg via INTRAVENOUS
  Filled 2017-12-31: qty 10

## 2017-12-31 MED ORDER — PALONOSETRON HCL INJECTION 0.25 MG/5ML
INTRAVENOUS | Status: AC
  Filled 2017-12-31: qty 5

## 2017-12-31 MED ORDER — LORAZEPAM 2 MG/ML IJ SOLN
INTRAMUSCULAR | Status: AC
  Filled 2017-12-31: qty 1

## 2017-12-31 MED ORDER — SODIUM CHLORIDE 0.9 % IV SOLN
80.0000 mg/m2 | Freq: Once | INTRAVENOUS | Status: AC
  Administered 2017-12-31: 168 mg via INTRAVENOUS
  Filled 2017-12-31: qty 168

## 2017-12-31 MED ORDER — SODIUM CHLORIDE 0.9 % IV SOLN
Freq: Once | INTRAVENOUS | Status: AC
  Administered 2017-12-31: 08:00:00 via INTRAVENOUS
  Filled 2017-12-31: qty 250

## 2017-12-31 MED ORDER — PALONOSETRON HCL INJECTION 0.25 MG/5ML
0.2500 mg | Freq: Once | INTRAVENOUS | Status: AC
  Administered 2017-12-31: 0.25 mg via INTRAVENOUS

## 2017-12-31 NOTE — Patient Instructions (Signed)
[facility] Discharge Instructions for Patients Receiving Chemotherapy  Today you received the following chemotherapy agents Cisplatin and Etoposide.  To help prevent nausea and vomiting after your treatment, we encourage you to take your nausea medication as directed.  If you develop nausea and vomiting that is not controlled by your nausea medication, call the clinic.   BELOW ARE SYMPTOMS THAT SHOULD BE REPORTED IMMEDIATELY:  *FEVER GREATER THAN 100.5 F  *CHILLS WITH OR WITHOUT FEVER  NAUSEA AND VOMITING THAT IS NOT CONTROLLED WITH YOUR NAUSEA MEDICATION  *UNUSUAL SHORTNESS OF BREATH  *UNUSUAL BRUISING OR BLEEDING  TENDERNESS IN MOUTH AND THROAT WITH OR WITHOUT PRESENCE OF ULCERS  *URINARY PROBLEMS  *BOWEL PROBLEMS  UNUSUAL RASH Items with * indicate a potential emergency and should be followed up as soon as possible.  Feel free to call the clinic should you have any questions or concerns. The clinic phone number is [phone].  Please show the [facility] at check-in to the Emergency Department and triage nurse.

## 2017-12-31 NOTE — Telephone Encounter (Signed)
Scheduled appt per 12/17 los - pt to get an updated schedule after treatment tomorrow .

## 2017-12-31 NOTE — Progress Notes (Signed)
Etoposide infusing. Patient without complaints. BP 127/74. Sat- 97%, pulse 87, respirations 17.

## 2017-12-31 NOTE — Progress Notes (Signed)
Patient with approximately 325 ml of post Cisplatin hydration fluid remaining. Per Dr. Julien Nordmann it is ok to increase post hydration fluid rate to 325 ml/hr. Patient has a radiation appointment at 4:45.

## 2017-12-31 NOTE — Progress Notes (Signed)
Etoposide started at 1256. At 1304 noted with facial redness and complaints of being "extremely hot". Etoposide paused, normal saline started and Sandi Mealy, PA notified.  Vitals- 1305- 153/98, sat- 93%, Resp- 20, Temp- 98.8, pulse- 103 1308- Pepcid 20 mg IV given per Lucianne Lei Tanner,PA 1314- 131/85, sat- 95%, pulse- 92, resp- 18 1325- 103/75, sat 94%, pulse- 87, resp- 18 1330- Etoposide restarted 1333- Patient c/o feeling hot- Etoposide paused. BP 132/88, sat- 97%, pulse 95, resp 18 1351 Ativan 0.5mg  given IV per Sandi Mealy, PA 1353- Etoposide restarted per Sandi Mealy, PA. BP 122/64, sat 97%, pulse 93

## 2018-01-01 ENCOUNTER — Ambulatory Visit
Admission: RE | Admit: 2018-01-01 | Discharge: 2018-01-01 | Disposition: A | Discharge status: Home / Self Care | Payer: Medicare Other | Source: Ambulatory Visit | Attending: Radiation Oncology | Admitting: Radiation Oncology

## 2018-01-01 ENCOUNTER — Other Ambulatory Visit: Payer: Self-pay

## 2018-01-01 ENCOUNTER — Other Ambulatory Visit: Payer: Medicare Other

## 2018-01-01 ENCOUNTER — Inpatient Hospital Stay: Payer: Medicare Other

## 2018-01-01 ENCOUNTER — Ambulatory Visit: Payer: Medicare Other | Admitting: Medical

## 2018-01-01 VITALS — BP 129/70 | HR 77 | Temp 97.8°F | Resp 16

## 2018-01-01 DIAGNOSIS — C3411 Malignant neoplasm of upper lobe, right bronchus or lung: Secondary | ICD-10-CM

## 2018-01-01 DIAGNOSIS — Z5111 Encounter for antineoplastic chemotherapy: Secondary | ICD-10-CM | POA: Diagnosis not present

## 2018-01-01 MED ORDER — DEXAMETHASONE SODIUM PHOSPHATE 10 MG/ML IJ SOLN
10.0000 mg | Freq: Once | INTRAMUSCULAR | Status: AC
  Administered 2018-01-01: 10 mg via INTRAVENOUS

## 2018-01-01 MED ORDER — SODIUM CHLORIDE 0.9 % IV SOLN
96.0000 mg/m2 | Freq: Once | INTRAVENOUS | Status: AC
  Administered 2018-01-01: 200 mg via INTRAVENOUS
  Filled 2018-01-01: qty 10

## 2018-01-01 MED ORDER — HEPARIN SOD (PORK) LOCK FLUSH 100 UNIT/ML IV SOLN
500.0000 [IU] | Freq: Once | INTRAVENOUS | Status: AC | PRN
  Administered 2018-01-01: 500 [IU]
  Filled 2018-01-01: qty 5

## 2018-01-01 MED ORDER — SODIUM CHLORIDE 0.9 % IV SOLN
Freq: Once | INTRAVENOUS | Status: AC
  Administered 2018-01-01: 08:00:00 via INTRAVENOUS
  Filled 2018-01-01: qty 250

## 2018-01-01 MED ORDER — SODIUM CHLORIDE 0.9% FLUSH
10.0000 mL | INTRAVENOUS | Status: DC | PRN
  Administered 2018-01-01: 10 mL
  Filled 2018-01-01: qty 10

## 2018-01-01 MED ORDER — FAMOTIDINE IN NACL 20-0.9 MG/50ML-% IV SOLN: 20.0000 mg | Freq: Once | INTRAVENOUS | Status: DC

## 2018-01-01 MED ORDER — LORAZEPAM 2 MG/ML IJ SOLN: 0.5000 mg | Freq: Once | INTRAMUSCULAR | Status: DC

## 2018-01-01 MED ORDER — DEXAMETHASONE SODIUM PHOSPHATE 10 MG/ML IJ SOLN
INTRAMUSCULAR | Status: AC
  Filled 2018-01-01: qty 1

## 2018-01-01 MED ORDER — LORAZEPAM 2 MG/ML IJ SOLN
INTRAMUSCULAR | Status: AC
  Filled 2018-01-01: qty 1

## 2018-01-01 MED ORDER — FAMOTIDINE IN NACL 20-0.9 MG/50ML-% IV SOLN
INTRAVENOUS | Status: AC
  Filled 2018-01-01: qty 50

## 2018-01-01 MED ORDER — LORAZEPAM 2 MG/ML IJ SOLN
0.5000 mg | Freq: Once | INTRAMUSCULAR | Status: AC
  Administered 2018-01-01: 0.5 mg via INTRAVENOUS

## 2018-01-01 MED ORDER — FAMOTIDINE IN NACL 20-0.9 MG/50ML-% IV SOLN
20.0000 mg | Freq: Once | INTRAVENOUS | Status: AC
  Administered 2018-01-01: 20 mg via INTRAVENOUS

## 2018-01-01 NOTE — Progress Notes (Signed)
ati

## 2018-01-01 NOTE — Patient Instructions (Signed)
[facility] Discharge Instructions for Patients Receiving Chemotherapy  Today you received the following chemotherapy agents Etoposide.  To help prevent nausea and vomiting after your treatment, we encourage you to take your nausea medication as directed.  If you develop nausea and vomiting that is not controlled by your nausea medication, call the clinic.   BELOW ARE SYMPTOMS THAT SHOULD BE REPORTED IMMEDIATELY:  *FEVER GREATER THAN 100.5 F  *CHILLS WITH OR WITHOUT FEVER  NAUSEA AND VOMITING THAT IS NOT CONTROLLED WITH YOUR NAUSEA MEDICATION  *UNUSUAL SHORTNESS OF BREATH  *UNUSUAL BRUISING OR BLEEDING  TENDERNESS IN MOUTH AND THROAT WITH OR WITHOUT PRESENCE OF ULCERS  *URINARY PROBLEMS  *BOWEL PROBLEMS  UNUSUAL RASH Items with * indicate a potential emergency and should be followed up as soon as possible.  Feel free to call the clinic should you have any questions or concerns. The clinic phone number is [phone].  Please show the [facility] at check-in to the Emergency Department and triage nurse.

## 2018-01-02 ENCOUNTER — Inpatient Hospital Stay: Payer: Medicare Other

## 2018-01-02 ENCOUNTER — Ambulatory Visit
Admission: RE | Admit: 2018-01-02 | Discharge: 2018-01-02 | Disposition: A | Discharge status: Home / Self Care | Payer: Medicare Other | Source: Ambulatory Visit | Attending: Radiation Oncology | Admitting: Radiation Oncology

## 2018-01-02 ENCOUNTER — Ambulatory Visit: Payer: Medicare Other

## 2018-01-02 VITALS — BP 131/86 | HR 80 | Temp 98.1°F | Resp 18

## 2018-01-02 DIAGNOSIS — C3411 Malignant neoplasm of upper lobe, right bronchus or lung: Secondary | ICD-10-CM | POA: Diagnosis not present

## 2018-01-02 DIAGNOSIS — Z5111 Encounter for antineoplastic chemotherapy: Secondary | ICD-10-CM | POA: Diagnosis not present

## 2018-01-02 DIAGNOSIS — C3490 Malignant neoplasm of unspecified part of unspecified bronchus or lung: Secondary | ICD-10-CM

## 2018-01-02 MED ORDER — PEGFILGRASTIM 6 MG/0.6ML ~~LOC~~ PSKT
6.0000 mg | PREFILLED_SYRINGE | Freq: Once | SUBCUTANEOUS | Status: AC
  Administered 2018-01-02: 6 mg via SUBCUTANEOUS

## 2018-01-02 MED ORDER — FAMOTIDINE IN NACL 20-0.9 MG/50ML-% IV SOLN
INTRAVENOUS | Status: AC
  Filled 2018-01-02: qty 50

## 2018-01-02 MED ORDER — LORAZEPAM 2 MG/ML IJ SOLN
INTRAMUSCULAR | Status: AC
  Filled 2018-01-02: qty 1

## 2018-01-02 MED ORDER — SODIUM CHLORIDE 0.9 % IV SOLN
Freq: Once | INTRAVENOUS | Status: AC
  Administered 2018-01-02: 08:00:00 via INTRAVENOUS
  Filled 2018-01-02: qty 250

## 2018-01-02 MED ORDER — DEXAMETHASONE SODIUM PHOSPHATE 10 MG/ML IJ SOLN
INTRAMUSCULAR | Status: AC
  Filled 2018-01-02: qty 1

## 2018-01-02 MED ORDER — SODIUM CHLORIDE 0.9% FLUSH
10.0000 mL | INTRAVENOUS | Status: DC | PRN
  Administered 2018-01-02: 10 mL
  Filled 2018-01-02: qty 10

## 2018-01-02 MED ORDER — SONAFINE EX EMUL
1.0000 "application " | Freq: Once | CUTANEOUS | Status: AC
  Administered 2018-01-02: 1 via TOPICAL

## 2018-01-02 MED ORDER — SODIUM CHLORIDE 0.9 % IV SOLN
96.0000 mg/m2 | Freq: Once | INTRAVENOUS | Status: AC
  Administered 2018-01-02: 200 mg via INTRAVENOUS
  Filled 2018-01-02: qty 10

## 2018-01-02 MED ORDER — PEGFILGRASTIM 6 MG/0.6ML ~~LOC~~ PSKT
PREFILLED_SYRINGE | SUBCUTANEOUS | Status: AC
  Filled 2018-01-02: qty 0.6

## 2018-01-02 MED ORDER — HEPARIN SOD (PORK) LOCK FLUSH 100 UNIT/ML IV SOLN
500.0000 [IU] | Freq: Once | INTRAVENOUS | Status: AC | PRN
  Administered 2018-01-02: 500 [IU]
  Filled 2018-01-02: qty 5

## 2018-01-02 MED ORDER — DEXAMETHASONE SODIUM PHOSPHATE 10 MG/ML IJ SOLN
10.0000 mg | Freq: Once | INTRAMUSCULAR | Status: AC
  Administered 2018-01-02: 10 mg via INTRAVENOUS

## 2018-01-02 MED ORDER — FAMOTIDINE IN NACL 20-0.9 MG/50ML-% IV SOLN
20.0000 mg | Freq: Once | INTRAVENOUS | Status: AC
  Administered 2018-01-02: 20 mg via INTRAVENOUS

## 2018-01-02 MED ORDER — LORAZEPAM 2 MG/ML IJ SOLN
0.5000 mg | Freq: Once | INTRAMUSCULAR | Status: AC
  Administered 2018-01-02: 0.5 mg via INTRAVENOUS

## 2018-01-02 NOTE — Patient Instructions (Signed)
[facility] Discharge Instructions for Patients Receiving Chemotherapy  Today you received the following chemotherapy agent: Etoposide.  To help prevent nausea and vomiting after your treatment, we encourage you to take your nausea medication as directed.  If you develop nausea and vomiting that is not controlled by your nausea medication, call the clinic.   BELOW ARE SYMPTOMS THAT SHOULD BE REPORTED IMMEDIATELY:  *FEVER GREATER THAN 100.5 F  *CHILLS WITH OR WITHOUT FEVER  NAUSEA AND VOMITING THAT IS NOT CONTROLLED WITH YOUR NAUSEA MEDICATION  *UNUSUAL SHORTNESS OF BREATH  *UNUSUAL BRUISING OR BLEEDING  TENDERNESS IN MOUTH AND THROAT WITH OR WITHOUT PRESENCE OF ULCERS  *URINARY PROBLEMS  *BOWEL PROBLEMS  UNUSUAL RASH Items with * indicate a potential emergency and should be followed up as soon as possible.  Feel free to call the clinic should you have any questions or concerns. The clinic phone number is [phone].  Please show the [place] [place] at check-in to the Emergency Department and triage nurse.

## 2018-01-02 NOTE — Progress Notes (Signed)
  Radiation Oncology         [phone] ________________________________  Name: Scott Gomez MRN: [id number]  Date: 12/23/2017  DOB: 1934/12/25  SIMULATION AND TREATMENT PLANNING NOTE  DIAGNOSIS:     ICD-10-CM   1. Small cell lung cancer, right upper lobe (HCC) C34.11      Site:  chest  NARRATIVE:  The patient was brought to the [facility].  Identity was confirmed.  All relevant records and images related to the planned course of therapy were reviewed.   Written consent to proceed with treatment was confirmed which was freely given after reviewing the details related to the planned course of therapy had been reviewed with the patient.  Then, the patient was set-up in a stable reproducible  supine position for radiation therapy.  CT images were obtained.  Surface markings were placed.    Medically necessary complex treatment device(s) for immobilization:  accuform device.   The CT images were loaded into the planning software.  Then the target and avoidance structures were contoured.  Treatment planning then occurred.  The radiation prescription was entered and confirmed.  A total of 4 complex treatment devices were fabricated which relate to the designed radiation treatment fields. Additional reduced fields will be used as necessary to improve the dose homogeneity of the plan. Each of these customized fields/ complex treatment devices will be used on a daily basis during the radiation course. I have requested : 3D Simulation  I have requested a DVH of the following structures: target volume, spinal cord, lungs, heart.   The patient will undergo daily image guidance to ensure accurate localization of the target, and adequate minimize dose to the normal surrounding structures in close proximity to the target.  PLAN:  The patient will receive 60 Gy in 30 fractions initially. The patient will then receive a 6 Gy boost for a final dose of 66 Gy.  Special treatment  procedure The patient will also receive concurrent chemotherapy during the treatment. The patient may therefore experience increased toxicity or side effects and the patient will be monitored for such problems. This may require extra lab work as necessary. This therefore constitutes a special treatment procedure.   ________________________________   Jodelle Gross, MD, PhD

## 2018-01-02 NOTE — Progress Notes (Signed)
    DATE:  12/31/2017                                          X CHEMO/IMMUNOTHERAPY REACTION         MD: Dr. Julien Nordmann   AGENT/BLOOD PRODUCT RECEIVING TODAY:               Cisplatin and etoposide    AGENT/BLOOD PRODUCT RECEIVING IMMEDIATELY PRIOR TO REACTION:           Etoposide   VS: BP:      143/98   P:        99       SPO2:        93% on room air                BP:      103/75   P:        97       SPO2:        95% on room air     REACTION(S):            Flushing, facial erythema, overall sensation of burning throughout his body, and anxiety   PREMEDS:      Aloxi, dexamethasone 10 mg IV, and Emend   INTERVENTION: Ativan 0.5 mg IV x1 and Pepcid 20 mg IV x1   Review of Systems  Review of Systems  Constitutional: Negative for chills, diaphoresis and fever.       Flushing and sense of burning throughout the body  HENT: Negative for trouble swallowing and voice change.        Facial erythema  Respiratory: Negative for cough, chest tightness, shortness of breath and wheezing.   Cardiovascular: Negative for chest pain and palpitations.  Gastrointestinal: Negative for abdominal pain, constipation, diarrhea, nausea and vomiting.  Musculoskeletal: Negative for back pain and myalgias.  Neurological: Negative for dizziness, light-headedness and headaches.  Psychiatric/Behavioral: The patient is nervous/anxious.      Physical Exam  Physical Exam Constitutional:      General: He is not in acute distress.    Appearance: He is not diaphoretic.  HENT:     Head: Normocephalic and atraumatic.  Cardiovascular:     Rate and Rhythm: Normal rate and regular rhythm.     Heart sounds: Normal heart sounds. No murmur. No friction rub. No gallop.   Pulmonary:     Effort: Pulmonary effort is normal. No respiratory distress.     Breath sounds: Normal breath sounds. No wheezing or rales.  Skin:    General: Skin is warm and dry.     Findings: No erythema or rash.  Neurological:     Mental  Status: He is alert.     OUTCOME:            The patient responded to the above intervention was able to complete his etoposide dose without any additional issues of concern.        Sandi Mealy, MHS, PA-C

## 2018-01-02 NOTE — Progress Notes (Signed)
Pt here for patient teaching.  Pt given Radiation and You booklet and Sonafine.  Reviewed areas of pertinence such as fatigue, hair loss, skin changes and throat changes . Pt able to give teach back of to pat skin,apply Sonafine bid and avoid applying anything to skin within 4 hours of treatment. Pt verbalizes understanding of information given and will contact nursing with any questions or concerns.     Gloriajean Dell. Leonie Green, BSN

## 2018-01-03 ENCOUNTER — Ambulatory Visit: Payer: Medicare Other

## 2018-01-05 ENCOUNTER — Ambulatory Visit
Admission: RE | Admit: 2018-01-05 | Discharge: 2018-01-05 | Disposition: A | Discharge status: Home / Self Care | Payer: Medicare Other | Source: Ambulatory Visit | Attending: Radiation Oncology | Admitting: Radiation Oncology

## 2018-01-05 DIAGNOSIS — C3411 Malignant neoplasm of upper lobe, right bronchus or lung: Secondary | ICD-10-CM | POA: Diagnosis not present

## 2018-01-06 ENCOUNTER — Ambulatory Visit
Admission: RE | Admit: 2018-01-06 | Discharge: 2018-01-06 | Disposition: A | Discharge status: Home / Self Care | Payer: Medicare Other | Source: Ambulatory Visit | Attending: Radiation Oncology | Admitting: Radiation Oncology

## 2018-01-06 ENCOUNTER — Inpatient Hospital Stay: Payer: Medicare Other

## 2018-01-06 DIAGNOSIS — Z5111 Encounter for antineoplastic chemotherapy: Secondary | ICD-10-CM | POA: Diagnosis not present

## 2018-01-06 DIAGNOSIS — C3411 Malignant neoplasm of upper lobe, right bronchus or lung: Secondary | ICD-10-CM | POA: Diagnosis not present

## 2018-01-06 LAB — CMP (CANCER CENTER ONLY)
ALBUMIN: 3.8 g/dL (ref 3.5–5.0)
ALK PHOS: 123 U/L (ref 38–126)
ALT: 18 U/L (ref 0–44)
AST: 17 U/L (ref 15–41)
Anion gap: 12 (ref 5–15)
BUN: 21 mg/dL (ref 8–23)
CO2: 23 mmol/L (ref 22–32)
Calcium: 9.3 mg/dL (ref 8.9–10.3)
Chloride: 102 mmol/L (ref 98–111)
Creatinine: 1.12 mg/dL (ref 0.61–1.24)
GFR, Est AFR Am: >60 mL/min (ref >60)
GFR, Estimated: >60 mL/min (ref >60)
GLUCOSE: 122 mg/dL — AB (ref 70–99)
Potassium: 3.9 mmol/L (ref 3.5–5.1)
Sodium: 137 mmol/L (ref 135–145)
Total Bilirubin: 0.6 mg/dL (ref 0.3–1.2)
Total Protein: 6.8 g/dL (ref 6.5–8.1)

## 2018-01-06 LAB — CBC WITH DIFFERENTIAL (CANCER CENTER ONLY)
Abs Immature Granulocytes: 0 10*3/uL (ref 0.00–0.07)
Band Neutrophils: 6 %
Basophils Absolute: 0 10*3/uL (ref 0.0–0.1)
Basophils Relative: 0 %
Eosinophils Absolute: 0 10*3/uL (ref 0.0–0.5)
Eosinophils Relative: 0 %
HEMATOCRIT: 36.1 % — AB (ref 39.0–52.0)
HEMOGLOBIN: 11.9 g/dL — AB (ref 13.0–17.0)
Lymphocytes Relative: 2 %
Lymphs Abs: 0.6 10*3/uL — ABNORMAL LOW (ref 0.7–4.0)
MCH: 28.3 pg (ref 26.0–34.0)
MCHC: 33 g/dL (ref 30.0–36.0)
MCV: 86 fL (ref 80.0–100.0)
MONOS PCT: 1 %
Monocytes Absolute: 0.3 10*3/uL (ref 0.1–1.0)
Neutro Abs: 30 10*3/uL — ABNORMAL HIGH (ref 1.7–17.7)
Neutrophils Relative %: 91 %
Platelet Count: 301 10*3/uL (ref 150–400)
RBC: 4.2 MIL/uL — ABNORMAL LOW (ref 4.22–5.81)
RDW: 14.7 % (ref 11.5–15.5)
WBC Count: 30.9 10*3/uL — ABNORMAL HIGH (ref 4.0–10.5)
nRBC: 0 % (ref 0.0–0.2)

## 2018-01-06 LAB — MAGNESIUM: Magnesium: 1.7 mg/dL (ref 1.7–2.4)

## 2018-01-08 ENCOUNTER — Inpatient Hospital Stay: Payer: Medicare Other

## 2018-01-08 ENCOUNTER — Telehealth: Payer: Self-pay | Admitting: Medical Oncology

## 2018-01-08 ENCOUNTER — Inpatient Hospital Stay (HOSPITAL_BASED_OUTPATIENT_CLINIC_OR_DEPARTMENT_OTHER): Payer: Medicare Other | Admitting: Medical

## 2018-01-08 ENCOUNTER — Other Ambulatory Visit: Payer: Self-pay | Admitting: Emergency Medicine

## 2018-01-08 ENCOUNTER — Ambulatory Visit
Admission: RE | Admit: 2018-01-08 | Discharge: 2018-01-08 | Disposition: A | Discharge status: Home / Self Care | Payer: Medicare Other | Source: Ambulatory Visit | Attending: Radiation Oncology | Admitting: Radiation Oncology

## 2018-01-08 VITALS — BP 102/72 | HR 98 | Temp 97.9°F | Resp 18 | Ht 68.75 in | Wt 181.0 lb

## 2018-01-08 DIAGNOSIS — R1030 Lower abdominal pain, unspecified: Secondary | ICD-10-CM

## 2018-01-08 DIAGNOSIS — C3411 Malignant neoplasm of upper lobe, right bronchus or lung: Secondary | ICD-10-CM

## 2018-01-08 DIAGNOSIS — R1084 Generalized abdominal pain: Secondary | ICD-10-CM

## 2018-01-08 DIAGNOSIS — Z95828 Presence of other vascular implants and grafts: Secondary | ICD-10-CM

## 2018-01-08 DIAGNOSIS — C3490 Malignant neoplasm of unspecified part of unspecified bronchus or lung: Secondary | ICD-10-CM

## 2018-01-08 DIAGNOSIS — Z5111 Encounter for antineoplastic chemotherapy: Secondary | ICD-10-CM | POA: Diagnosis not present

## 2018-01-08 LAB — CBC WITH DIFFERENTIAL (CANCER CENTER ONLY)
Abs Immature Granulocytes: 0.32 10*3/uL — ABNORMAL HIGH (ref 0.00–0.07)
Basophils Absolute: 0.1 10*3/uL (ref 0.0–0.1)
Basophils Relative: 1 %
Eosinophils Absolute: 0 10*3/uL (ref 0.0–0.5)
Eosinophils Relative: 0 %
HCT: 31.1 % — ABNORMAL LOW (ref 39.0–52.0)
Hemoglobin: 10.6 g/dL — ABNORMAL LOW (ref 13.0–17.0)
Immature Granulocytes: 4 %
Lymphocytes Relative: 6 %
Lymphs Abs: 0.5 10*3/uL — ABNORMAL LOW (ref 0.7–4.0)
MCH: 28.4 pg (ref 26.0–34.0)
MCHC: 34.1 g/dL (ref 30.0–36.0)
MCV: 83.4 fL (ref 80.0–100.0)
Monocytes Absolute: 0.4 10*3/uL (ref 0.1–1.0)
Monocytes Relative: 6 %
Neutro Abs: 6.6 10*3/uL (ref 1.7–7.7)
Neutrophils Relative %: 83 %
Platelet Count: 148 10*3/uL — ABNORMAL LOW (ref 150–400)
RBC: 3.73 MIL/uL — ABNORMAL LOW (ref 4.22–5.81)
RDW: 14.5 % (ref 11.5–15.5)
WBC Count: 8 10*3/uL (ref 4.0–10.5)
nRBC: 0 % (ref 0.0–0.2)

## 2018-01-08 LAB — CMP (CANCER CENTER ONLY)
ALT: 15 U/L (ref 0–44)
AST: 11 U/L — AB (ref 15–41)
Albumin: 3.7 g/dL (ref 3.5–5.0)
Alkaline Phosphatase: 115 U/L (ref 38–126)
Anion gap: 9 (ref 5–15)
BUN: 22 mg/dL (ref 8–23)
CO2: 24 mmol/L (ref 22–32)
Calcium: 9.1 mg/dL (ref 8.9–10.3)
Chloride: 102 mmol/L (ref 98–111)
Creatinine: 0.82 mg/dL (ref 0.61–1.24)
GFR, Est AFR Am: >60 mL/min (ref >60)
GFR, Estimated: >60 mL/min (ref >60)
Glucose, Bld: 123 mg/dL — ABNORMAL HIGH (ref 70–99)
Potassium: 3.6 mmol/L (ref 3.5–5.1)
Sodium: 135 mmol/L (ref 135–145)
Total Bilirubin: 0.8 mg/dL (ref 0.3–1.2)
Total Protein: 6.5 g/dL (ref 6.5–8.1)

## 2018-01-08 MED ORDER — SODIUM CHLORIDE 0.9% FLUSH
10.0000 mL | INTRAVENOUS | Status: DC | PRN
  Administered 2018-01-08: 10 mL
  Filled 2018-01-08: qty 10

## 2018-01-08 MED ORDER — PANTOPRAZOLE SODIUM 40 MG PO TBEC: 40.0000 mg | DELAYED_RELEASE_TABLET | Freq: Two times a day (BID) | ORAL | 3 refills | Status: AC

## 2018-01-08 MED ORDER — HEPARIN SOD (PORK) LOCK FLUSH 100 UNIT/ML IV SOLN
500.0000 [IU] | Freq: Once | INTRAVENOUS | Status: DC | PRN
  Filled 2018-01-08: qty 5

## 2018-01-08 MED ORDER — HEPARIN SOD (PORK) LOCK FLUSH 100 UNIT/ML IV SOLN
500.0000 [IU] | Freq: Once | INTRAVENOUS | Status: AC | PRN
  Administered 2018-01-08: 500 [IU]
  Filled 2018-01-08: qty 5

## 2018-01-08 NOTE — Patient Instructions (Signed)

## 2018-01-08 NOTE — Telephone Encounter (Addendum)
"  Severe stomach pain, sharp,  below belly button is sinceTuesday". Denies constipation or diarrhea. LBM a week ago after taking  2 laxatives . Per Julien Nordmann schedule pt with Sandi Mealy. Schedule request sent.

## 2018-01-08 NOTE — Progress Notes (Signed)
Pt presents today with lower abd pain that does not worsen or improve with anything.  Reports mild constipation.  Afebrile.  States he's been eating better since stopping his chemo however this am he had one episode of N/V d/t the pain.

## 2018-01-09 ENCOUNTER — Ambulatory Visit (INDEPENDENT_AMBULATORY_CARE_PROVIDER_SITE_OTHER): Payer: Medicare Other | Admitting: Family Medicine

## 2018-01-09 ENCOUNTER — Encounter: Payer: Self-pay | Admitting: Physician Assistant

## 2018-01-09 ENCOUNTER — Encounter: Payer: Self-pay | Admitting: Family Medicine

## 2018-01-09 ENCOUNTER — Ambulatory Visit
Admission: RE | Admit: 2018-01-09 | Discharge: 2018-01-09 | Disposition: A | Discharge status: Home / Self Care | Payer: Medicare Other | Source: Ambulatory Visit | Attending: Radiation Oncology | Admitting: Radiation Oncology

## 2018-01-09 ENCOUNTER — Telehealth: Payer: Self-pay | Admitting: Medical

## 2018-01-09 VITALS — BP 106/64 | HR 93 | Temp 98.4°F | Wt 182.0 lb

## 2018-01-09 DIAGNOSIS — K2980 Duodenitis without bleeding: Secondary | ICD-10-CM | POA: Diagnosis not present

## 2018-01-09 DIAGNOSIS — I10 Essential (primary) hypertension: Secondary | ICD-10-CM

## 2018-01-09 DIAGNOSIS — C3411 Malignant neoplasm of upper lobe, right bronchus or lung: Secondary | ICD-10-CM | POA: Diagnosis not present

## 2018-01-09 NOTE — Patient Instructions (Signed)
Cut back or quit coffee (even decaf) - it is hard on the stomach  Increase your protonix to twice daily - let's see if that helps I placed a referral to GI We will work on an appt.  In the meantime-  Eat bland diet  Drink fluids  You can try tums   Watch for blood in stool, dark stool or increased pain or other symptoms and let us know  Let your oncologist know as well

## 2018-01-09 NOTE — Progress Notes (Signed)
Subjective:    Patient ID: Scott Gomez, male    DOB: Dec 30, 1934, 83 y.o.   MRN: [id number]  HPI Here for stomach pain   Has hosp in early dec  CT scan showed duodenitis at the time  He was placed on protnoix  Was improved for while -then started pain again on Tuesday Ate creamed potatoes  No nsaids  No asa   Hurts around umbilicus  Similar to pain he had in the hospital  No heartburn No burping or belching more than normal  No nauseated   Handling chemo fairly well   Has been constipated  No blood in stool  No dark or black stool   Currently in tx for lung cancer = chemo with port a cath  MRI showed some suspicious early metastatic disease (may need brain radiation tx in the future)   Appetite is ok - (unless stomach hurts)   BP has also been low   Wt Readings from Last 3 Encounters:  01/09/18 182 lb (82.6 kg)  01/08/18 181 lb (82.1 kg)  12/25/17 187 lb 9.6 oz (85.1 kg)   27.07 kg/m   Blood pressure BP Readings from Last 3 Encounters:  01/09/18 106/64  01/08/18 102/72  01/02/18 131/86  takes lisinopril 10 mg  Does not feel dizzy or lightheaded   Pulse Readings from Last 3 Encounters:  01/09/18 93  01/08/18 98  01/02/18 80    CT abd: CT Abdomen Pelvis W Contrast (Accession [id number]) (Order [id number])  Imaging  Date: 12/16/2017 Department: Surgery [facility] Released By/Authorizing: Iris Pert (auto-released)  Exam Information   Status Exam Begun  Exam Ended   Final [99] 12/16/2017 9:49 PM 12/16/2017 10:12 PM  PACS Images   Show images for CT Abdomen Pelvis W Contrast  Study Result   CLINICAL DATA:  Lung cancer. Nausea, hemoptysis, cough. Shortness of breath.  EXAM: CT CHEST, ABDOMEN, AND PELVIS WITH CONTRAST  TECHNIQUE: Multidetector CT imaging of the chest, abdomen and pelvis was performed following the standard protocol during bolus administration of intravenous  contrast.  CONTRAST:  146m ISOVUE-300 IOPAMIDOL (ISOVUE-300) INJECTION 61%  COMPARISON:  PET CT 11/26/2017  FINDINGS: CT CHEST FINDINGS  Cardiovascular: Tortuosity of the thoracic aorta with scattered aortic calcifications. Mild aneurysmal dilatation of the ascending thoracic aorta, 4.3 cm maximally. Heart is normal size. Small pericardial effusion, slightly increased since prior PET CT.  Mediastinum/Nodes: Right paratracheal/mediastinal conglomerate mass (likely arising from the medial right upper lobe) again noted which extends into the right hilum and subcarinal region, unchanged.  Lungs/Pleura: Right upper lobe mass measures up to 2.6 cm, likely slightly decreased in size. Medial right upper lobe mass invading the mediastinum and right hilum again noted, unchanged. Small right pleural effusion, increased since prior study. No confluent opacity or effusion on the left.  Musculoskeletal: Degenerative changes throughout the thoracic spine. No acute bony abnormality.  CT ABDOMEN PELVIS FINDINGS  Hepatobiliary: No focal hepatic abnormality. Gallbladder unremarkable.  Pancreas: No focal abnormality or ductal dilatation.  Spleen: No focal abnormality.  Normal size.  Adrenals/Urinary Tract: No adrenal abnormality. No focal renal abnormality. No stones or hydronephrosis. Urinary bladder is unremarkable.  Stomach/Bowel: Scattered colonic diverticulosis. No active diverticulitis. No evidence of bowel obstruction. There is wall thickening within the 2nd and 3rd portions of the duodenum with mild surrounding inflammation suggesting duodenitis.  Vascular/Lymphatic: Aortic atherosclerosis. No enlarged abdominal or pelvic lymph nodes.  Reproductive: Mild prominence of the prostate with central  calcifications.  Other: No free fluid or free air.  Musculoskeletal: Moderate compression deformity at L1 is stable. Degenerative changes throughout the lumbar  spine.  IMPRESSION: Large conglomerate medial right upper lobe mass invading the mediastinum and right hilum again noted, unchanged. Separate 2.6 cm right upper lobe/apical rounded mass. This may have decreased slightly when compared to prior study.  Small pericardial effusion and right pleural effusion, both slightly increased since prior study.  Diffuse coronary artery disease, aortic atherosclerosis.  4.3 cm ascending thoracic aortic aneurysm. Recommend annual imaging followup by CTA or MRA. This recommendation follows 2010 ACCF/AHA/AATS/ACR/ASA/SCA/SCAI/SIR/STS/SVM Guidelines for the Diagnosis and Management of Patients with Thoracic Aortic Disease. Circulation. 2010; 121: Z563-O756  Mild wall thickening and haziness around the 2nd and 3rd portions of the duodenum suggesting duodenitis.  Abdominal aortic atherosclerosis.  Prominent prostate with central calcifications.  Scattered colonic diverticula.  No active diverticulitis.   Electronically Signed   By: Rolm Baptise M.D.   On: 12/16/2017 22:29   Patient Active Problem List   Diagnosis Date Noted  . Port-A-Cath in place 01/08/2018  . Protein calorie malnutrition ([place]) 12/26/2017  . Hypomagnesemia 12/26/2017  . Malnutrition of moderate degree 12/18/2017  . Duodenitis   . Small cell lung cancer, right upper lobe ([place]) 12/04/2017  . Encounter for antineoplastic chemotherapy 12/04/2017  . Goals of care, counseling/discussion 12/04/2017  . DVT (deep venous thrombosis) ([place]) 11/27/2017  . Aortic aneurysm ([place]) 11/18/2017  . Small cell lung cancer ([place]) 11/16/2017  . GERD (gastroesophageal reflux disease) 11/07/2017  . Routine general medical examination at a health care facility 04/09/2015  . Osteoporosis 04/07/2015  . Constipation 05/16/2014  . Polyneuropathy 03/23/2014  . Encounter for Medicare annual wellness exam 11/25/2012  . Spinal stenosis 08/27/2011  . Vitamin B 12 deficiency 08/06/2011  . Falls  frequently 05/01/2011  . Prostate cancer screening 11/07/2010  . DEPRESSION 02/26/2010  . Essential hypertension, benign 03/29/2008  . COPD (chronic obstructive pulmonary disease) ([place]) 07/08/2007  . Sleep apnea 07/08/2007  . TESTOSTERONE DEFICIENCY 06/22/2007  . Prediabetes 01/07/2007  . Generalized anxiety disorder 01/07/2007  . CARPAL TUNNEL SYNDROME 01/07/2007  . Peptic ulcer 01/07/2007  . OSTEOARTHRITIS, HANDS, BILATERAL 01/07/2007  . SHOULDER PAIN, LEFT, CHRONIC 01/07/2007  . PALPITATIONS, HX OF 01/07/2007  . Nephrolithiasis 01/07/2007   Past Medical History:  Diagnosis Date  . Anxiety   . Arthritis    osteoarthritis both hands  . Carpal tunnel syndrome   . Diabetes mellitus    type II  . Fatigue   . Hypercholesteremia   . Hypopotassemia   . Leg cramps   . Nephrolithiasis    Hx of  . Palpitations    Hx of   . Shoulder pain, left   . Snoring   . Testosterone deficiency   . Ulcer    peptic ulcer disease   Past Surgical History:  Procedure Laterality Date  . BACK SURGERY  1978   x2 disc  . IR IMAGING GUIDED PORT INSERTION  12/19/2017   Social History   Tobacco Use  . Smoking status: Former Smoker    Packs/day: 2.00    Years: 38.00    Pack years: 76.00    Types: Cigarettes    Last attempt to quit: 01/14/1993    Years since quitting: 25.0  . Smokeless tobacco: Never Used  Substance Use Topics  . Alcohol use: No    Alcohol/week: 0.0 standard drinks  . Drug use: No   Family History  Problem Relation Age of  Onset  . Cancer Brother        kidney  . Heart disease Brother        MI  . Hypertension Mother   . Allergies Mother   . Hypertension Father   . Allergies Father   . Cancer Sister   . Emphysema Sister   . Heart attack Sister    Allergies  Allergen Reactions  . Buspirone Hcl     REACTION: itching  . Crestor [Rosuvastatin Calcium]     Increased his blood sugar   . Prednisone     REACTION: sick, per wife he can take this.   Current  Outpatient Medications on File Prior to Visit  Medication Sig Dispense Refill  . albuterol (PROAIR HFA) 108 (90 Base) MCG/ACT inhaler Inhale 2 puffs into the lungs every 4 (four) hours as needed for wheezing or shortness of breath. 1 Inhaler 2  . ALPRAZolam (XANAX) 0.5 MG tablet TAKE ONE (1) TABLET BY MOUTH TWO (2) TIMES DAILY AS NEEDED FOR ANXIETY 60 tablet 3  . apixaban (ELIQUIS) 5 MG TABS tablet Take 1 tablet (5 mg total) by mouth 2 (two) times daily. 28 tablet 0  . Blood Glucose Monitoring Suppl (ONETOUCH VERIO IQ SYSTEM) w/Device KIT Use to test blood sugar once daily and as needed dx R73.9 1 kit 0  . cyclobenzaprine (FLEXERIL) 10 MG tablet Take 10 mg by mouth 2 (two) times daily.     Marland Kitchen docusate sodium (COLACE) 100 MG capsule Take 100 mg by mouth daily.    Marland Kitchen gabapentin (NEURONTIN) 100 MG capsule Take 200 mg by mouth 3 (three) times daily.     Marland Kitchen glucose blood (ONETOUCH VERIO) test strip USE TO CHECK BLOOD SUGAR ONCE DAILY 100 each 3  . glucose blood test strip Use as instructed 100 each 3  . levalbuterol (XOPENEX) 0.63 MG/3ML nebulizer solution Take 3 mLs (0.63 mg total) by nebulization every 4 (four) hours as needed for wheezing or shortness of breath. 3 mL 12  . lisinopril (PRINIVIL,ZESTRIL) 10 MG tablet Take 1 tablet (10 mg total) by mouth daily. 90 tablet 3  . magnesium oxide (MAG-OX) 400 (241.3 Mg) MG tablet Take 1 tablet (400 mg total) by mouth daily. 30 tablet 1  . ondansetron (ZOFRAN) 8 MG tablet Take 1 tablet (8 mg total) by mouth every 8 (eight) hours as needed for nausea or vomiting. 30 tablet 1  . ONE TOUCH LANCETS MISC Use to test blood sugar once daily and as needed. Dx 790.29 200 each 1  . Oxycodone HCl 10 MG TABS Take 1 tablet by mouth every 6 (six) hours as needed (pain).     . pantoprazole (PROTONIX) 40 MG tablet Take 1 tablet (40 mg total) by mouth 2 (two) times daily. 180 tablet 3  . PARoxetine (PAXIL) 10 MG tablet Take 1 tablet (10 mg total) by mouth daily. 30 tablet 11  .  potassium chloride SA (K-DUR,KLOR-CON) 20 MEQ tablet TAKE ONE (1) TABLET BY MOUTH EVERY DAY (Patient taking differently: Take 20 mEq by mouth daily. ) 90 tablet 3  . prochlorperazine (COMPAZINE) 10 MG tablet TAKE 1 TABLET (10 MG TOTAL) BY MOUTH EVERY 6 (SIX) HOURS AS NEEDED FOR NAUSEA OR VOMITING. 30 tablet 0  . Tiotropium Bromide Monohydrate (SPIRIVA RESPIMAT) 2.5 MCG/ACT AERS Inhale 2 puffs into the lungs daily. (Patient taking differently: Inhale 2 puffs into the lungs daily as needed (wheezing). ) 4 g 2  . travoprost, benzalkonium, (TRAVATAN) 0.004 % ophthalmic solution Place 1  drop into both eyes at bedtime.      . vitamin B-12 (CYANOCOBALAMIN) 1000 MCG tablet Take 1,000 mcg by mouth daily.     No current facility-administered medications on file prior to visit.       Review of Systems  Constitutional: Positive for fatigue. Negative for activity change, appetite change, fever and unexpected weight change.  HENT: Negative for congestion, rhinorrhea, sore throat and trouble swallowing.   Eyes: Negative for pain, redness, itching and visual disturbance.  Respiratory: Negative for cough, chest tightness, shortness of breath and wheezing.   Cardiovascular: Negative for chest pain and palpitations.  Gastrointestinal: Positive for abdominal pain. Negative for abdominal distention, anal bleeding, blood in stool, constipation, diarrhea, nausea, rectal pain and vomiting.       No n/v unless he has just had chemo  Endocrine: Negative for cold intolerance, heat intolerance, polydipsia and polyuria.  Genitourinary: Negative for difficulty urinating, dysuria, frequency and urgency.  Musculoskeletal: Negative for arthralgias, joint swelling and myalgias.  Skin: Negative for pallor and rash.  Neurological: Negative for dizziness, tremors, weakness, numbness and headaches.  Hematological: Negative for adenopathy. Does not bruise/bleed easily.  Psychiatric/Behavioral: Negative for decreased concentration  and dysphoric mood. The patient is not nervous/anxious.        Objective:   Physical Exam Constitutional:      General: He is not in acute distress.    Appearance: He is well-developed. He is not ill-appearing.     Comments: Well appearing   HENT:     Head: Normocephalic and atraumatic.     Mouth/Throat:     Mouth: Mucous membranes are moist.     Pharynx: No oropharyngeal exudate.  Eyes:     General: No scleral icterus.    Conjunctiva/sclera: Conjunctivae normal.     Pupils: Pupils are equal, round, and reactive to light.  Neck:     Musculoskeletal: Normal range of motion and neck supple.  Cardiovascular:     Rate and Rhythm: Normal rate and regular rhythm.     Heart sounds: Normal heart sounds.  Pulmonary:     Effort: Pulmonary effort is normal. No respiratory distress.     Breath sounds: Normal breath sounds. No wheezing or rales.  Abdominal:     General: Abdomen is flat. Bowel sounds are normal. There is no distension or abdominal bruit.     Palpations: Abdomen is soft. There is no hepatomegaly, splenomegaly, mass or pulsatile mass.     Tenderness: There is abdominal tenderness in the periumbilical area. There is no guarding or rebound. Negative signs include Murphy's sign and McBurney's sign.     Hernia: No hernia is present.  Lymphadenopathy:     Cervical: No cervical adenopathy.  Skin:    General: Skin is warm and dry.     Coloration: Skin is not jaundiced or pale.     Findings: No bruising, erythema or rash.  Neurological:     Mental Status: He is alert.     Coordination: Coordination normal.     Deep Tendon Reflexes: Reflexes normal.  Psychiatric:        Mood and Affect: Mood normal.           Assessment & Plan:   Problem List Items Addressed This Visit      Cardiovascular and Mediastinum   Essential hypertension, benign    bp is stable today  No cp or palpitations or headaches or edema  No side effects to medicines  BP Readings from Last 3  Encounters:  01/09/18 106/64  01/08/18 102/72  01/02/18 131/86    bp is lower- pt has no symptoms however  inst if dizzy or positional symptoms to alert [place]         Digestive   Duodenitis - Primary    Recent hospitalization for this Reviewed hospital records, lab results and studies in detail  Improvement initially with protonix-now peri umbilical pain has returned  Some constipation/not severe  Will inc ppi to bid  Ref to GI for eval  inst if worse/severe pain/ blood in stool or black stool to alert [place] and get to ED Recommend avoidance of caffeine and acidic/spicy food and bev along with nsaids       Relevant Orders   Ambulatory referral to Gastroenterology

## 2018-01-09 NOTE — Telephone Encounter (Signed)
No los per 12/26

## 2018-01-09 NOTE — Progress Notes (Signed)
[facility] Progress Note   Scott Gomez [id number] [birth year] 83 y.o.  Scott Gomez is managed by Dr. Eilleen Kempf  Actively treated with chemotherapy/immunotherapy/hormonal therapy: yes  Current Therapy: Cisplatin & Etoposide with Neulasta support  Last Treated: 12/31/17 (cycle 2 day 1)  Assessment: Plan:    Port-A-Cath in place - Plan: heparin lock flush 100 unit/mL, sodium chloride flush (NS) 0.9 % injection 10 mL  Small cell lung cancer ([place])  Generalized abdominal pain - Plan: pantoprazole (PROTONIX) 40 MG tablet    1) Limited stage SCLC: The pt is s/p cycle 2 of Cisplatin/Etoposide dosed on 12/31/17.  He continues on concurrent radiation.  He will return on 01/20/18.  2) Abdominal pain: The pt was told to increase protonix to twice daily dosing.  He may need to see a GI doctor.  He will see his primary physician tomorrow.   Please see After Visit Summary for patient specific instructions.  Future Appointments  Date Time Provider [place]  01/12/2018  2:15 PM [facility] [id number] [facility] None  01/13/2018 11:00 AM [facility] LAB 2 [facility] None  01/13/2018 11:30 AM [facility] [id number] [facility] None  01/15/2018 11:30 AM [facility] [id number] [facility] None  01/16/2018  8:30 AM Esterwood, Amy S, PA-C [facility]  01/16/2018 11:45 AM [facility] [id number] [facility] None  01/19/2018 11:30 AM [facility] [id number] [facility] None  01/20/2018  9:15 AM [facility] LAB 5 [facility] None  01/20/2018  9:30 AM [facility] [place] FLUSH [facility] None  01/20/2018 10:00 AM ,  E., PA-C [facility] None  01/20/2018 11:00 AM [facility] INFUSION [facility] None  01/20/2018 11:30 AM [facility] [id number] [facility] None  01/21/2018  8:30 AM [facility] INFUSION [facility] None  01/21/2018 11:30 AM [facility] [id number] [facility] None  01/22/2018  8:30 AM [facility] [id number] [facility] None  01/22/2018  1:00 PM [facility] INFUSION  [facility] None  01/22/2018  1:45 PM Neff, Barbara L, RD [facility] None  01/23/2018 11:30 AM [facility] [id number] [facility] None  01/26/2018 11:30 AM [facility] [id number] [facility] None  01/27/2018 10:45 AM [facility] LAB 3 [facility] None  01/27/2018 11:30 AM [facility] [id number] [facility] None  01/28/2018 11:30 AM [facility] [id number] [facility] None  01/29/2018 11:30 AM [facility] [id number] [facility] None  01/30/2018 11:30 AM [facility] [id number] [facility] None  02/02/2018 11:30 AM [facility] [id number] [facility] None  02/03/2018 11:00 AM [facility] LAB 6 [facility] None  02/03/2018 11:30 AM [facility] [id number] [facility] None  02/04/2018 11:30 AM [facility] [id number] [facility] None  02/05/2018 11:30 AM [facility] [id number] [facility] None  02/06/2018 11:30 AM [facility] [id number] [facility] None  02/09/2018 11:30 AM [facility] [id number] [facility] None  02/09/2018  2:45 PM [facility] LAB 5 [facility] None  02/09/2018  3:00 PM [facility] [place] FLUSH [facility] None  02/09/2018  3:30 PM Curt Bears, MD [facility] None  02/10/2018  8:00 AM [facility] INFUSION [facility] None  02/10/2018 11:30 AM [facility] [id number] [facility] None  02/11/2018  8:30 AM [facility] INFUSION [facility] None  02/11/2018 11:30 AM [facility] [id number] [facility] None  02/12/2018  8:30 AM [facility] INFUSION [facility] None  02/12/2018 11:30 AM [facility] [id number] [facility] None  02/13/2018 11:30 AM [facility] [id number] [facility] None  02/16/2018 11:30 AM [facility] [id number] [facility] None  02/17/2018 11:30 AM [facility] LAB 2 [facility] None  02/24/2018 11:30 AM [facility] LAB 3 [facility] None  02/25/2018 10:45 AM Rigoberto Noel, MD [facility] None  04/15/2018 10:00 AM Eustace Pen, LPN [facility]  06/22/1155  8:30 AM [name], [name], MD [facility]    No orders of the defined types were placed in  this encounter.      Subjective:   Patient ID:  Scott Gomez is a 83 y.o. (DOB [birth year]) male.  Chief Complaint:  Chief Complaint  Patient presents with  . Abdominal Pain    HPI Scott Gomez is an 83 y.o. male with a limited stage SCLC.  He is followed by Dr. Julien Nordmann and is s/p cycle 2 of Cisplatin/Etoposide.  He continues on concurrent radiation.  He presents with lower abdominal and pelvic pain since Tuesday night.  He denies nausea, vomiting, constipation, diarrhea, fever, or chills.  He continues on Elaquis for a suspected DVT.  He reports a distant history of peptic ulcer.  He denies melana or bright red blood per rectum.  Medications: I have reviewed the patient's current medications.  Allergies:  Allergies  Allergen Reactions  . Buspirone Hcl     REACTION: itching  . Crestor [Rosuvastatin Calcium]     Increased his blood sugar   . Prednisone     REACTION: sick, per wife he can take this.    Past Medical History:  Diagnosis Date  . Anxiety   . Arthritis    osteoarthritis both hands  . Carpal tunnel syndrome   . Diabetes mellitus    type II  . Fatigue   . Hypercholesteremia   . Hypopotassemia   . Leg cramps   . Nephrolithiasis    Hx of  . Palpitations    Hx of   . Shoulder pain, left   . Snoring   . Testosterone deficiency   . Ulcer    peptic ulcer disease    Past Surgical History:  Procedure Laterality Date  . BACK SURGERY  1978   x2 disc  . IR IMAGING GUIDED PORT INSERTION  12/19/2017    Family History  Problem Relation Age of Onset  . Cancer Brother        kidney  . Heart disease Brother        MI  . Hypertension Mother   . Allergies Mother   . Hypertension Father   . Allergies Father   . Cancer Sister   . Emphysema Sister   . Heart attack Sister     Social History   Socioeconomic History  . Marital status: Married    Spouse name: Not on file  . Number of children: Not on file  . Years of education: Not on file  . Highest education level: Not on file  Occupational History  . Not  on file  Social Needs  . Financial resource strain: Not on file  . Food insecurity:    Worry: Not on file    Inability: Not on file  . Transportation needs:    Medical: Not on file    Non-medical: Not on file  Tobacco Use  . Smoking status: Former Smoker    Packs/day: 2.00    Years: 38.00    Pack years: 76.00    Types: Cigarettes    Last attempt to quit: 01/14/1993    Years since quitting: 25.0  . Smokeless tobacco: Never Used  Substance and Sexual Activity  . Alcohol use: No    Alcohol/week: 0.0 standard drinks  . Drug use: No  . Sexual activity: Never  Lifestyle  . Physical activity:    Days per week: Not on file    Minutes per session: Not on file  . Stress: Not on file  Relationships  . Social connections:    Talks on phone: Not on  file    Gets together: Not on file    Attends religious service: Not on file    Active member of club or organization: Not on file    Attends meetings of clubs or organizations: Not on file    Relationship status: Not on file  . Intimate partner violence:    Fear of current or ex partner: Not on file    Emotionally abused: Not on file    Physically abused: Not on file    Forced sexual activity: Not on file  Other Topics Concern  . Not on file  Social History Narrative   Lives with wife Silvia Markuson   Retired    Past Medical History, Surgical history, Social history, and Family history were reviewed and updated as appropriate.   Please see review of systems for further details on the patient's review from today.   Review of Systems:  Review of Systems  Constitutional: Negative for activity change, appetite change, chills, diaphoresis and fever.  Respiratory: Negative for cough, chest tightness and shortness of breath.   Cardiovascular: Negative for chest pain, palpitations and leg swelling.  Gastrointestinal: Positive for abdominal pain. Negative for abdominal distention, anal bleeding, blood in stool, constipation, diarrhea,  nausea and vomiting.    Objective:   Physical Exam:  BP 102/72 (BP Location: Left Arm, Patient Position: Sitting)   Pulse 98   Temp 97.9 F (36.6 C) (Oral)   Resp 18   Ht 5' 8.75" (1.746 m)   Wt 181 lb (82.1 kg)   SpO2 97%   BMI 26.92 kg/m  ECOG: 0  Physical Exam Constitutional:      General: He is not in acute distress.    Appearance: He is not diaphoretic.  HENT:     Head: Normocephalic and atraumatic.  Cardiovascular:     Rate and Rhythm: Normal rate and regular rhythm.     Heart sounds: Normal heart sounds. No murmur. No friction rub. No gallop.   Pulmonary:     Effort: Pulmonary effort is normal. No respiratory distress.     Breath sounds: Normal breath sounds. No wheezing or rales.  Abdominal:     Tenderness: There is abdominal tenderness in the suprapubic area.  Skin:    General: Skin is warm and dry.     Findings: No erythema or rash.  Neurological:     Mental Status: He is alert.     Motor: No weakness.     Lab Review:     Component Value Date/Time   NA 135 01/08/2018 1132   K 3.6 01/08/2018 1132   CL 102 01/08/2018 1132   CO2 24 01/08/2018 1132   GLUCOSE 123 (H) 01/08/2018 1132   BUN 22 01/08/2018 1132   CREATININE 0.82 01/08/2018 1132   CALCIUM 9.1 01/08/2018 1132   PROT 6.5 01/08/2018 1132   ALBUMIN 3.7 01/08/2018 1132   AST 11 (L) 01/08/2018 1132   ALT 15 01/08/2018 1132   ALKPHOS 115 01/08/2018 1132   BILITOT 0.8 01/08/2018 1132   GFRNONAA >60 01/08/2018 1132   GFRAA >60 01/08/2018 1132       Component Value Date/Time   WBC 8.0 01/08/2018 1132   WBC 8.8 12/18/2017 0623   RBC 3.73 (L) 01/08/2018 1132   HGB 10.6 (L) 01/08/2018 1132   HCT 31.1 (L) 01/08/2018 1132   PLT 148 (L) 01/08/2018 1132   MCV 83.4 01/08/2018 1132   MCH 28.4 01/08/2018 1132   MCHC 34.1 01/08/2018 1132   RDW  14.5 01/08/2018 1132   LYMPHSABS 0.5 (L) 01/08/2018 1132   MONOABS 0.4 01/08/2018 1132   EOSABS 0.0 01/08/2018 1132   BASOSABS 0.1 01/08/2018 1132    -------------------------------  Imaging from last 24 hours (if applicable):  Radiology interpretation: Ct Chest W Contrast  Result Date: 12/16/2017 CLINICAL DATA:  Lung cancer. Nausea, hemoptysis, cough. Shortness of breath. EXAM: CT CHEST, ABDOMEN, AND PELVIS WITH CONTRAST TECHNIQUE: Multidetector CT imaging of the chest, abdomen and pelvis was performed following the standard protocol during bolus administration of intravenous contrast. CONTRAST:  178mL ISOVUE-300 IOPAMIDOL (ISOVUE-300) INJECTION 61% COMPARISON:  PET CT 11/26/2017 FINDINGS: CT CHEST FINDINGS Cardiovascular: Tortuosity of the thoracic aorta with scattered aortic calcifications. Mild aneurysmal dilatation of the ascending thoracic aorta, 4.3 cm maximally. Heart is normal size. Small pericardial effusion, slightly increased since prior PET CT. Mediastinum/Nodes: Right paratracheal/mediastinal conglomerate mass (likely arising from the medial right upper lobe) again noted which extends into the right hilum and subcarinal region, unchanged. Lungs/Pleura: Right upper lobe mass measures up to 2.6 cm, likely slightly decreased in size. Medial right upper lobe mass invading the mediastinum and right hilum again noted, unchanged. Small right pleural effusion, increased since prior study. No confluent opacity or effusion on the left. Musculoskeletal: Degenerative changes throughout the thoracic spine. No acute bony abnormality. CT ABDOMEN PELVIS FINDINGS Hepatobiliary: No focal hepatic abnormality. Gallbladder unremarkable. Pancreas: No focal abnormality or ductal dilatation. Spleen: No focal abnormality.  Normal size. Adrenals/Urinary Tract: No adrenal abnormality. No focal renal abnormality. No stones or hydronephrosis. Urinary bladder is unremarkable. Stomach/Bowel: Scattered colonic diverticulosis. No active diverticulitis. No evidence of bowel obstruction. There is wall thickening within the 2nd and 3rd portions of the duodenum with mild  surrounding inflammation suggesting duodenitis. Vascular/Lymphatic: Aortic atherosclerosis. No enlarged abdominal or pelvic lymph nodes. Reproductive: Mild prominence of the prostate with central calcifications. Other: No free fluid or free air. Musculoskeletal: Moderate compression deformity at L1 is stable. Degenerative changes throughout the lumbar spine. IMPRESSION: Large conglomerate medial right upper lobe mass invading the mediastinum and right hilum again noted, unchanged. Separate 2.6 cm right upper lobe/apical rounded mass. This may have decreased slightly when compared to prior study. Small pericardial effusion and right pleural effusion, both slightly increased since prior study. Diffuse coronary artery disease, aortic atherosclerosis. 4.3 cm ascending thoracic aortic aneurysm. Recommend annual imaging followup by CTA or MRA. This recommendation follows 2010 ACCF/AHA/AATS/ACR/ASA/SCA/SCAI/SIR/STS/SVM Guidelines for the Diagnosis and Management of Patients with Thoracic Aortic Disease. Circulation. 2010; 121: N027-O536 Mild wall thickening and haziness around the 2nd and 3rd portions of the duodenum suggesting duodenitis. Abdominal aortic atherosclerosis. Prominent prostate with central calcifications. Scattered colonic diverticula.  No active diverticulitis. Electronically Signed   By: Rolm Baptise M.D.   On: 12/16/2017 22:29   Mr Jeri Cos UY Contrast  Result Date: 12/27/2017 CLINICAL DATA:  83 year old male with small cell lung cancer. Staging. Nausea and difficulty walking. EXAM: MRI HEAD WITHOUT AND WITH CONTRAST TECHNIQUE: Multiplanar, multiecho pulse sequences of the brain and surrounding structures were obtained without and with intravenous contrast. CONTRAST:  14mL MULTIHANCE GADOBENATE DIMEGLUMINE 529 MG/ML IV SOLN COMPARISON:  Face CT 07/30/2015.  Brain MRI 01/01/2011. FINDINGS: Brain: There is a small but suspicious 3 millimeter rounded area of enhancement in the left periventricular white  matter on series 13, image 94 and series 15, image 16. And there is a 2nd similar small but suspicious focus of enhancement in the lower right cerebellum along the lateral aspect of the vermis on series 13,  image 31. No associated edema or mass effect at the sites. No other abnormal parenchymal enhancement identified. No dural thickening. No restricted diffusion to suggest acute infarction. No midline shift, mass effect, ventriculomegaly, extra-axial collection or acute intracranial hemorrhage. Cervicomedullary junction and pituitary are within normal limits. No chronic cerebral blood products or cortical encephalomalacia identified. Scattered small but occasionally patchy cerebral white matter T2 and FLAIR hyperintensity in both hemispheres is similar to that in 2012. Vascular: Major intracranial vascular flow voids are stable since 2012. the major dural venous sinuses are enhancing and appear to be patent. Skull and upper cervical spine: Visualized bone marrow signal is within normal limits. Sinuses/Orbits: Stable and negative. Other: Mastoid air cells are clear. Visible internal auditory structures appear normal. Occasional small cervical lymph nodes to do not appear enlarged by CT criteria. Scalp and face soft tissues appear negative. IMPRESSION: 1. 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 No associated edema or mass effect. 2. No other acute intracranial abnormality. Chronic cerebral white matter signal changes which are probably small vessel disease related. Electronically Signed   By: Genevie Ann M.D.   On: 12/27/2017 20:01   Ct Abdomen Pelvis W Contrast  Result Date: 12/16/2017 CLINICAL DATA:  Lung cancer. Nausea, hemoptysis, cough. Shortness of breath. EXAM: CT CHEST, ABDOMEN, AND PELVIS WITH CONTRAST TECHNIQUE: Multidetector CT imaging of  the chest, abdomen and pelvis was performed following the standard protocol during bolus administration of intravenous contrast. CONTRAST:  160mL ISOVUE-300 IOPAMIDOL (ISOVUE-300) INJECTION 61% COMPARISON:  PET CT 11/26/2017 FINDINGS: CT CHEST FINDINGS Cardiovascular: Tortuosity of the thoracic aorta with scattered aortic calcifications. Mild aneurysmal dilatation of the ascending thoracic aorta, 4.3 cm maximally. Heart is normal size. Small pericardial effusion, slightly increased since prior PET CT. Mediastinum/Nodes: Right paratracheal/mediastinal conglomerate mass (likely arising from the medial right upper lobe) again noted which extends into the right hilum and subcarinal region, unchanged. Lungs/Pleura: Right upper lobe mass measures up to 2.6 cm, likely slightly decreased in size. Medial right upper lobe mass invading the mediastinum and right hilum again noted, unchanged. Small right pleural effusion, increased since prior study. No confluent opacity or effusion on the left. Musculoskeletal: Degenerative changes throughout the thoracic spine. No acute bony abnormality. CT ABDOMEN PELVIS FINDINGS Hepatobiliary: No focal hepatic abnormality. Gallbladder unremarkable. Pancreas: No focal abnormality or ductal dilatation. Spleen: No focal abnormality.  Normal size. Adrenals/Urinary Tract: No adrenal abnormality. No focal renal abnormality. No stones or hydronephrosis. Urinary bladder is unremarkable. Stomach/Bowel: Scattered colonic diverticulosis. No active diverticulitis. No evidence of bowel obstruction. There is wall thickening within the 2nd and 3rd portions of the duodenum with mild surrounding inflammation suggesting duodenitis. Vascular/Lymphatic: Aortic atherosclerosis. No enlarged abdominal or pelvic lymph nodes. Reproductive: Mild prominence of the prostate with central calcifications. Other: No free fluid or free air. Musculoskeletal: Moderate compression deformity at L1 is stable. Degenerative  changes throughout the lumbar spine. IMPRESSION: Large conglomerate medial right upper lobe mass invading the mediastinum and right hilum again noted, unchanged. Separate 2.6 cm right upper lobe/apical rounded mass. This may have decreased slightly when compared to prior study. Small pericardial effusion and right pleural effusion, both slightly increased since prior study. Diffuse coronary artery disease, aortic atherosclerosis. 4.3 cm ascending thoracic aortic aneurysm. Recommend annual imaging followup by CTA or MRA. This recommendation follows 2010 ACCF/AHA/AATS/ACR/ASA/SCA/SCAI/SIR/STS/SVM Guidelines for the Diagnosis  and Management of Patients with Thoracic Aortic Disease. Circulation. 2010; 121: G182-X937 Mild wall thickening and haziness around the 2nd and 3rd portions of the duodenum suggesting duodenitis. Abdominal aortic atherosclerosis. Prominent prostate with central calcifications. Scattered colonic diverticula.  No active diverticulitis. Electronically Signed   By: Rolm Baptise M.D.   On: 12/16/2017 22:29   Dg Chest Port 1 View  Result Date: 12/16/2017 CLINICAL DATA:  Lung cancer, cough, nausea EXAM: PORTABLE CHEST 1 VIEW COMPARISON:  11/07/2017 FINDINGS: Right mediastinal adenopathy again visualized as described on prior PET CT. Heart is mildly enlarged. No confluent airspace opacities or effusions. Previously seen right upper lobe mass on PET CT difficult to visualize by plain film. IMPRESSION: Right mediastinal adenopathy again noted, unchanged. Mild cardiomegaly. Electronically Signed   By: Rolm Baptise M.D.   On: 12/16/2017 20:57   Ir Imaging Guided Port Insertion  Result Date: 12/19/2017 CLINICAL DATA:  Right upper lobe small cell lung cancer, access for chemotherapy EXAM: RIGHT INTERNAL JUGULAR SINGLE LUMEN POWER PORT CATHETER INSERTION Date:  12/19/2017 12/19/2017 11:11 am Radiologist:  Jerilynn Mages. Daryll Brod, MD Guidance:  Ultrasound and fluoroscopic MEDICATIONS: Ancef 2 g; The antibiotic was  administered within an appropriate time interval prior to skin puncture. ANESTHESIA/SEDATION: Versed 2.0 mg IV; Fentanyl 75 mcg IV; Moderate Sedation Time:  29 minutes The patient was continuously monitored during the procedure by the interventional radiology nurse under my direct supervision. FLUOROSCOPY TIME:  0 minutes, 48 seconds (7.9 mGy) COMPLICATIONS: None immediate. CONTRAST:  None. PROCEDURE: Informed consent was obtained from the patient following explanation of the procedure, risks, benefits and alternatives. The patient understands, agrees and consents for the procedure. All questions were addressed. A time out was performed. Maximal barrier sterile technique utilized including caps, mask, sterile gowns, sterile gloves, large sterile drape, hand hygiene, and 2% chlorhexidine scrub. Under sterile conditions and local anesthesia, right internal jugular micropuncture venous access was performed. Access was performed with ultrasound. Images were obtained for documentation of the patent right internal jugular vein. A guide wire was inserted followed by a transitional dilator. This allowed insertion of a guide wire and catheter into the IVC. Measurements were obtained from the SVC / RA junction back to the right IJ venotomy site. In the right infraclavicular chest, a subcutaneous pocket was created over the second anterior rib. This was done under sterile conditions and local anesthesia. 1% lidocaine with epinephrine was utilized for this. A 2.5 cm incision was made in the skin. Blunt dissection was performed to create a subcutaneous pocket over the right pectoralis major muscle. The pocket was flushed with saline vigorously. There was adequate hemostasis. The port catheter was assembled and checked for leakage. The port catheter was secured in the pocket with two retention sutures. The tubing was tunneled subcutaneously to the right venotomy site and inserted into the SVC/RA junction through a valved  peel-away sheath. Position was confirmed with fluoroscopy. Images were obtained for documentation. The patient tolerated the procedure well. No immediate complications. Incisions were closed in a two layer fashion with 4 - 0 Vicryl suture. Dermabond was applied to the skin. The port catheter was accessed, blood was aspirated followed by saline and heparin flushes. Needle was removed. A dry sterile dressing was applied. IMPRESSION: Ultrasound and fluoroscopically guided right internal jugular single lumen power port catheter insertion. Tip in the SVC/RA junction. Catheter ready for use. Electronically Signed   By: Jerilynn Mages.  Shick M.D.   On: 12/19/2017 11:32  This case was discussed with Dr. Julien Nordmann. He expressed agreement with my management of this patient.

## 2018-01-11 NOTE — Assessment & Plan Note (Signed)
bp is stable today  No cp or palpitations or headaches or edema  No side effects to medicines  BP Readings from Last 3 Encounters:  01/09/18 106/64  01/08/18 102/72  01/02/18 131/86    bp is lower- pt has no symptoms however  inst if dizzy or positional symptoms to alert [place]

## 2018-01-11 NOTE — Assessment & Plan Note (Addendum)
Recent hospitalization for this Reviewed hospital records, lab results and studies in detail  Improvement initially with protonix-now peri umbilical pain has returned  Some constipation/not severe  Will inc ppi to bid  Ref to GI for eval  inst if worse/severe pain/ blood in stool or black stool to alert [place] and get to ED Recommend avoidance of caffeine and acidic/spicy food and bev along with nsaids

## 2018-01-12 ENCOUNTER — Ambulatory Visit
Admission: RE | Admit: 2018-01-12 | Discharge: 2018-01-12 | Disposition: A | Discharge status: Home / Self Care | Payer: Medicare Other | Source: Ambulatory Visit | Attending: Radiation Oncology | Admitting: Radiation Oncology

## 2018-01-12 DIAGNOSIS — C3411 Malignant neoplasm of upper lobe, right bronchus or lung: Secondary | ICD-10-CM | POA: Diagnosis not present

## 2018-01-13 ENCOUNTER — Ambulatory Visit
Admission: RE | Admit: 2018-01-13 | Discharge: 2018-01-13 | Disposition: A | Discharge status: Home / Self Care | Payer: Medicare Other | Source: Ambulatory Visit | Attending: Radiation Oncology | Admitting: Radiation Oncology

## 2018-01-13 ENCOUNTER — Inpatient Hospital Stay: Payer: Medicare Other

## 2018-01-13 DIAGNOSIS — C3411 Malignant neoplasm of upper lobe, right bronchus or lung: Secondary | ICD-10-CM | POA: Diagnosis not present

## 2018-01-13 DIAGNOSIS — Z5111 Encounter for antineoplastic chemotherapy: Secondary | ICD-10-CM | POA: Diagnosis not present

## 2018-01-13 LAB — CBC WITH DIFFERENTIAL (CANCER CENTER ONLY)
Abs Immature Granulocytes: 0.61 10*3/uL — ABNORMAL HIGH (ref 0.00–0.07)
Basophils Absolute: 0.1 10*3/uL (ref 0.0–0.1)
Basophils Relative: 2 %
Eosinophils Absolute: 0 10*3/uL (ref 0.0–0.5)
Eosinophils Relative: 0 %
HCT: 29.6 % — ABNORMAL LOW (ref 39.0–52.0)
Hemoglobin: 9.7 g/dL — ABNORMAL LOW (ref 13.0–17.0)
Immature Granulocytes: 13 %
Lymphocytes Relative: 15 %
Lymphs Abs: 0.7 10*3/uL (ref 0.7–4.0)
MCH: 27.8 pg (ref 26.0–34.0)
MCHC: 32.8 g/dL (ref 30.0–36.0)
MCV: 84.8 fL (ref 80.0–100.0)
Monocytes Absolute: 0.8 10*3/uL (ref 0.1–1.0)
Monocytes Relative: 17 %
NRBC: 0 % (ref 0.0–0.2)
Neutro Abs: 2.5 10*3/uL (ref 1.7–7.7)
Neutrophils Relative %: 53 %
Platelet Count: 64 10*3/uL — ABNORMAL LOW (ref 150–400)
RBC: 3.49 MIL/uL — ABNORMAL LOW (ref 4.22–5.81)
RDW: 14.8 % (ref 11.5–15.5)
WBC Count: 4.7 10*3/uL (ref 4.0–10.5)

## 2018-01-13 LAB — CMP (CANCER CENTER ONLY)
ALT: 12 U/L (ref 0–44)
AST: 12 U/L — ABNORMAL LOW (ref 15–41)
Albumin: 3.5 g/dL (ref 3.5–5.0)
Alkaline Phosphatase: 90 U/L (ref 38–126)
Anion gap: 10 (ref 5–15)
BUN: 14 mg/dL (ref 8–23)
CO2: 24 mmol/L (ref 22–32)
Calcium: 9.2 mg/dL (ref 8.9–10.3)
Chloride: 102 mmol/L (ref 98–111)
Creatinine: 0.9 mg/dL (ref 0.61–1.24)
GFR, Estimated: >60 mL/min (ref >60)
Glucose, Bld: 139 mg/dL — ABNORMAL HIGH (ref 70–99)
Potassium: 3.8 mmol/L (ref 3.5–5.1)
Sodium: 136 mmol/L (ref 135–145)
Total Bilirubin: 0.4 mg/dL (ref 0.3–1.2)
Total Protein: 6.3 g/dL — ABNORMAL LOW (ref 6.5–8.1)

## 2018-01-13 LAB — MAGNESIUM: Magnesium: 1.5 mg/dL — ABNORMAL LOW (ref 1.7–2.4)

## 2018-01-15 ENCOUNTER — Other Ambulatory Visit: Payer: Self-pay | Admitting: Internal Medicine

## 2018-01-15 ENCOUNTER — Ambulatory Visit
Admission: RE | Admit: 2018-01-15 | Discharge: 2018-01-15 | Disposition: A | Discharge status: Home / Self Care | Payer: Medicare Other | Source: Ambulatory Visit | Attending: Radiation Oncology | Admitting: Radiation Oncology

## 2018-01-15 DIAGNOSIS — D649 Anemia, unspecified: Secondary | ICD-10-CM | POA: Insufficient documentation

## 2018-01-15 DIAGNOSIS — Z510 Encounter for antineoplastic radiation therapy: Secondary | ICD-10-CM | POA: Diagnosis not present

## 2018-01-15 DIAGNOSIS — C3411 Malignant neoplasm of upper lobe, right bronchus or lung: Secondary | ICD-10-CM | POA: Insufficient documentation

## 2018-01-16 ENCOUNTER — Encounter: Payer: Self-pay | Admitting: Physician Assistant

## 2018-01-16 ENCOUNTER — Ambulatory Visit: Payer: Medicare Other | Admitting: Physician Assistant

## 2018-01-16 ENCOUNTER — Ambulatory Visit
Admission: RE | Admit: 2018-01-16 | Discharge: 2018-01-16 | Disposition: A | Discharge status: Home / Self Care | Payer: Medicare Other | Source: Ambulatory Visit | Attending: Radiation Oncology | Admitting: Radiation Oncology

## 2018-01-16 ENCOUNTER — Telehealth: Payer: Self-pay

## 2018-01-16 ENCOUNTER — Other Ambulatory Visit: Payer: Self-pay | Admitting: Radiation Oncology

## 2018-01-16 VITALS — BP 126/70 | HR 84 | Ht 68.75 in | Wt 187.0 lb

## 2018-01-16 DIAGNOSIS — R1031 Right lower quadrant pain: Secondary | ICD-10-CM | POA: Diagnosis not present

## 2018-01-16 DIAGNOSIS — K2980 Duodenitis without bleeding: Secondary | ICD-10-CM

## 2018-01-16 DIAGNOSIS — Z510 Encounter for antineoplastic radiation therapy: Secondary | ICD-10-CM | POA: Diagnosis not present

## 2018-01-16 DIAGNOSIS — K5903 Drug induced constipation: Secondary | ICD-10-CM | POA: Diagnosis not present

## 2018-01-16 DIAGNOSIS — R1032 Left lower quadrant pain: Secondary | ICD-10-CM | POA: Diagnosis not present

## 2018-01-16 MED ORDER — LUBIPROSTONE 24 MCG PO CAPS: 24.0000 ug | ORAL_CAPSULE | Freq: Two times a day (BID) | ORAL | 3 refills | Status: DC

## 2018-01-16 MED ORDER — SUCRALFATE 1 G PO TABS: 1.0000 g | ORAL_TABLET | Freq: Three times a day (TID) | ORAL | 2 refills | Status: AC

## 2018-01-16 NOTE — Telephone Encounter (Signed)
Printed calender of upcoming appointment. Per 1/3 walk in

## 2018-01-16 NOTE — Progress Notes (Signed)
Agree with assessment and plan by PA Esterwood. We will see how he does with attempt at bowel cleansing. If the patient continues to have issues agree with consideration of cross-sectional imaging and likely need for colonoscopy.

## 2018-01-16 NOTE — Patient Instructions (Signed)
We sent a prescription to [facility] pharmacy. 1. Amitiza 24 mcg- take 1 capsule twice daily.( sample provided)   Purge the bowels with Magnesium Citrate- you can get this at the pharmacy in the laxative aisle.  Continue prune juice daily. Push fluids. Continue Pantoprazole sodium 40 mg, take 1 tablet by mouth twice daily.  Call us back in a week if the abdominal pain is persisting. Ask for Any's nurse Beth. [phone] Normal BMI (Body Mass Index- based on height and weight) is between 23 and 30. Your BMI today is Body mass index is 27.82 kg/m. Marland Kitchen Please consider follow up  regarding your BMI with your Primary Care Provider.

## 2018-01-16 NOTE — Progress Notes (Signed)
Subjective:    Patient ID: Scott Gomez, male    DOB: [birth year], 84 y.o.   MRN: [id number]  HPI Scott Gomez is a pleasant 84 year old white male, new to GI today referred by Dr. Rica Gomez for evaluation of lower abdominal pain and constipation. Patient has not had any recent GI evaluation, he relates prior colonoscopy done in the 1970s. Patient was diagnosed with a T4 N3 M0 small cell lung cancer in November 2019 is currently being followed by Dr. Earlie Gomez and is undergoing chemotherapy and simultaneous radiation currently.  He says he is has a very difficult time with nausea and inability to eat on chemo days, and generally feels much better when he is in between chemo sessions.  He has been using oxycodone as needed for pain. He says he started noticing lower abdominal pain about a month ago.  He points to his mid lower abdomen, below the umbilicus.  He says his pain is intermittent and dull.  He is not aware of any exacerbating or ameliorating factors.  He denies any increase in discomfort postprandially.  He has been quite constipated for the past few weeks has not had a good bowel movement for several days.  He is not sure whether pain is improved post bowel movement.  He has not noticed any melena or hematochezia. He was started on Protonix 40 mg twice daily a few weeks ago and says this has been somewhat helpful in general.  He has been using Zofran as needed. He takes Colace for constipation and occasional Dulcolax but has not been taking either regularly.  Last imaging of the abdomen was done with CT 12/16/2017 which showed scattered diverticulosis, there was wall thickening of the duodenum and the second and third portion of the duodenum with mild stranding and inflammatory changes consistent with duodenitis.  He has a 4.3 cm abdominal aortic aneurysm. On chest CT he had a large right upper lobe mass invading the mediastinum.  Other medical problems include adult onset diabetes mellitus,  COPD, obstructive sleep apnea, anxiety, and history of DVT for which he is on Eliquis.  Labs from 01/13/2018 reviewed-globin 9.7/hematocrit 29.6, WBC of 4.7, platelets 64, LFTs within normal limits.    Review of Systems; Pertinent positive and negative review of systems were noted in the above HPI section.  All other review of systems was otherwise negative.  Outpatient Encounter Medications as of 01/16/2018  Medication Sig  . albuterol (PROAIR HFA) 108 (90 Base) MCG/ACT inhaler Inhale 2 puffs into the lungs every 4 (four) hours as needed for wheezing or shortness of breath.  . ALPRAZolam (XANAX) 0.5 MG tablet TAKE ONE (1) TABLET BY MOUTH TWO (2) TIMES DAILY AS NEEDED FOR ANXIETY  . apixaban (ELIQUIS) 5 MG TABS tablet Take 1 tablet (5 mg total) by mouth 2 (two) times daily.  . Blood Glucose Monitoring Suppl (ONETOUCH VERIO IQ SYSTEM) w/Device KIT Use to test blood sugar once daily and as needed dx R73.9  . cyclobenzaprine (FLEXERIL) 10 MG tablet Take 10 mg by mouth 2 (two) times daily.   Marland Kitchen docusate sodium (COLACE) 100 MG capsule Take 100 mg by mouth daily.  Marland Kitchen gabapentin (NEURONTIN) 100 MG capsule Take 200 mg by mouth 3 (three) times daily.   Marland Kitchen glucose blood (ONETOUCH VERIO) test strip USE TO CHECK BLOOD SUGAR ONCE DAILY  . glucose blood test strip Use as instructed  . levalbuterol (XOPENEX) 0.63 MG/3ML nebulizer solution Take 3 mLs (0.63 mg total) by nebulization every 4 (four)  hours as needed for wheezing or shortness of breath.  . lisinopril (PRINIVIL,ZESTRIL) 10 MG tablet Take 1 tablet (10 mg total) by mouth daily. (Patient taking differently: Take 5 mg by mouth daily. )  . magnesium oxide (MAG-OX) 400 (241.3 Mg) MG tablet Take 1 tablet (400 mg total) by mouth daily.  . ondansetron (ZOFRAN) 8 MG tablet Take 1 tablet (8 mg total) by mouth every 8 (eight) hours as needed for nausea or vomiting.  . ONE TOUCH LANCETS MISC Use to test blood sugar once daily and as needed. Dx 790.29  . Oxycodone  HCl 10 MG TABS Take 1 tablet by mouth every 6 (six) hours as needed (pain).   . pantoprazole (PROTONIX) 40 MG tablet Take 1 tablet (40 mg total) by mouth 2 (two) times daily.  Marland Kitchen PARoxetine (PAXIL) 10 MG tablet Take 1 tablet (10 mg total) by mouth daily.  . potassium chloride SA (K-DUR,KLOR-CON) 20 MEQ tablet TAKE ONE (1) TABLET BY MOUTH EVERY DAY (Patient taking differently: Take 20 mEq by mouth daily. )  . prochlorperazine (COMPAZINE) 10 MG tablet TAKE 1 TABLET (10 MG TOTAL) BY MOUTH EVERY 6 (SIX) HOURS AS NEEDED FOR NAUSEA OR VOMITING.  . Tiotropium Bromide Monohydrate (SPIRIVA RESPIMAT) 2.5 MCG/ACT AERS Inhale 2 puffs into the lungs daily. (Patient taking differently: Inhale 2 puffs into the lungs daily as needed (wheezing). )  . travoprost, benzalkonium, (TRAVATAN) 0.004 % ophthalmic solution Place 1 drop into both eyes at bedtime.    . vitamin B-12 (CYANOCOBALAMIN) 1000 MCG tablet Take 1,000 mcg by mouth daily.  Marland Kitchen lubiprostone (AMITIZA) 24 MCG capsule Take 1 capsule (24 mcg total) by mouth 2 (two) times daily with a meal.   No facility-administered encounter medications on file as of 01/16/2018.    Allergies  Allergen Reactions  . Buspirone Hcl     REACTION: itching  . Crestor [Rosuvastatin Calcium]     Increased his blood sugar   . Prednisone     REACTION: sick, per wife he can take this.   Patient Active Problem List   Diagnosis Date Noted  . Port-A-Cath in place 01/08/2018  . Protein calorie malnutrition ([place]) 12/26/2017  . Hypomagnesemia 12/26/2017  . Malnutrition of moderate degree 12/18/2017  . Duodenitis   . Small cell lung cancer, right upper lobe ([place]) 12/04/2017  . Encounter for antineoplastic chemotherapy 12/04/2017  . Goals of care, counseling/discussion 12/04/2017  . DVT (deep venous thrombosis) ([place]) 11/27/2017  . Aortic aneurysm ([place]) 11/18/2017  . Small cell lung cancer ([place]) 11/16/2017  . GERD (gastroesophageal reflux disease) 11/07/2017  . Routine general  medical examination at a health care facility 04/09/2015  . Osteoporosis 04/07/2015  . Constipation 05/16/2014  . Polyneuropathy 03/23/2014  . Encounter for Medicare annual wellness exam 11/25/2012  . Spinal stenosis 08/27/2011  . Vitamin B 12 deficiency 08/06/2011  . Falls frequently 05/01/2011  . Prostate cancer screening 11/07/2010  . DEPRESSION 02/26/2010  . Essential hypertension, benign 03/29/2008  . COPD (chronic obstructive pulmonary disease) ([place]) 07/08/2007  . Sleep apnea 07/08/2007  . TESTOSTERONE DEFICIENCY 06/22/2007  . Prediabetes 01/07/2007  . Generalized anxiety disorder 01/07/2007  . CARPAL TUNNEL SYNDROME 01/07/2007  . Peptic ulcer 01/07/2007  . OSTEOARTHRITIS, HANDS, BILATERAL 01/07/2007  . SHOULDER PAIN, LEFT, CHRONIC 01/07/2007  . PALPITATIONS, HX OF 01/07/2007  . Nephrolithiasis 01/07/2007   Social History   Socioeconomic History  . Marital status: Married    Spouse name: Not on file  . Number of children: 3  .  Years of education: Not on file  . Highest education level: Not on file  Occupational History  . Occupation: retired  Scientific laboratory technician  . Financial resource strain: Not on file  . Food insecurity:    Worry: Not on file    Inability: Not on file  . Transportation needs:    Medical: Not on file    Non-medical: Not on file  Tobacco Use  . Smoking status: Former Smoker    Packs/day: 2.00    Years: 38.00    Pack years: 76.00    Types: Cigarettes    Last attempt to quit: 01/14/1993    Years since quitting: 25.0  . Smokeless tobacco: Never Used  Substance and Sexual Activity  . Alcohol use: No    Alcohol/week: 0.0 standard drinks  . Drug use: No  . Sexual activity: Never  Lifestyle  . Physical activity:    Days per week: Not on file    Minutes per session: Not on file  . Stress: Not on file  Relationships  . Social connections:    Talks on phone: Not on file    Gets together: Not on file    Attends religious service: Not on file     Active member of club or organization: Not on file    Attends meetings of clubs or organizations: Not on file    Relationship status: Not on file  . Intimate partner violence:    Fear of current or ex partner: Not on file    Emotionally abused: Not on file    Physically abused: Not on file    Forced sexual activity: Not on file  Other Topics Concern  . Not on file  Social History Narrative   Lives with wife Cuinn Westerhold   Retired    Mr. Olliff family history includes Allergies in his father and mother; Breast cancer in his sister; Diabetes in his father; Heart disease in his brother and father; Hypertension in his father and mother; Kidney cancer in his brother and sister.      Objective:    Vitals:   01/16/18 0835  BP: 126/70  Pulse: 84    Physical Exam; well-developed elderly white male in no acute distress, accompanied by his wife.  Patient chronically ill-appearing BMI 27.8.  HEENT; nontraumatic normocephalic EOMI PERRLA sclera anicteric oral mucosa moist, Cardiovascular; regular rate and rhythm with S1-S2, Pulmonary ;clear bilaterally, Abdomen; soft,, nondistended, nontender there is no palpable mass or hepatosplenomegaly bowel sounds are present.  Rectal ;exam not done, Extremities; no clubbing cyanosis or edema skin warm and dry, Neuropsych; alert and oriented, grossly nonfocal mood and affect appropriate       Assessment & Plan:   #52 84 year old white male with new diagnosis of small cell lung cancer November 2019, T4 N3 M0.  He did have MRI suspicious for early brain mets.  He is currently undergoing chemotherapy and radiation. Patient presents here with 1 month history of intermittent lower abdominal pain and progressive constipation.  Constipation is multifactorial secondary to pain medication, decreased food and fluid intake. Etiology of his lower abdominal pain is not clear, this may be secondary to obstipation. #2 CT scan done about a month ago did show  evidence of duodenitis, and this is being treated with twice daily PPI.  His current location of pain does not correlate with duodenitis. Rule out intra-abdominal metastatic disease/small cell lung CA  #3  anticoagulation-on Eliquis #4.  History of DVT #5  4.3 cm abdominal aortic  aneurysm #6 COPD  Plan; continue Protonix 40 mg p.o. twice daily Purge bowel, with either a MiraLAX prep or mag citrate, then start Amitiza 24 mcg p.o. twice daily.  (Wife says he has tried MiraLAX in the past without much success. Patient encouraged to increase fiber intake and fluid intake, continue prune juice daily. If he has no improvement in abdominal pain after bowel purge, I have asked him to call back in about a week, and may need to pursue repeat abdominal imaging.  Amy S Esterwood PA-C 01/16/2018   Cc: [name], [name], MD

## 2018-01-19 ENCOUNTER — Ambulatory Visit
Admission: RE | Admit: 2018-01-19 | Discharge: 2018-01-19 | Disposition: A | Discharge status: Home / Self Care | Payer: Medicare Other | Source: Ambulatory Visit | Attending: Radiation Oncology | Admitting: Radiation Oncology

## 2018-01-19 DIAGNOSIS — Z510 Encounter for antineoplastic radiation therapy: Secondary | ICD-10-CM | POA: Diagnosis not present

## 2018-01-20 ENCOUNTER — Inpatient Hospital Stay (HOSPITAL_BASED_OUTPATIENT_CLINIC_OR_DEPARTMENT_OTHER): Payer: Medicare Other | Admitting: Medical

## 2018-01-20 ENCOUNTER — Telehealth: Payer: Self-pay | Admitting: Pulmonary Disease

## 2018-01-20 ENCOUNTER — Inpatient Hospital Stay: Payer: Medicare Other

## 2018-01-20 ENCOUNTER — Ambulatory Visit
Admission: RE | Admit: 2018-01-20 | Discharge: 2018-01-20 | Disposition: A | Discharge status: Home / Self Care | Payer: Medicare Other | Source: Ambulatory Visit | Attending: Radiation Oncology | Admitting: Radiation Oncology

## 2018-01-20 ENCOUNTER — Ambulatory Visit: Payer: Medicare Other

## 2018-01-20 ENCOUNTER — Ambulatory Visit (HOSPITAL_COMMUNITY)
Admission: RE | Admit: 2018-01-20 | Discharge: 2018-01-20 | Disposition: A | Discharge status: Home / Self Care | Payer: Medicare Other | Source: Ambulatory Visit | Attending: Internal Medicine | Admitting: Internal Medicine

## 2018-01-20 ENCOUNTER — Inpatient Hospital Stay: Payer: Medicare Other | Attending: Internal Medicine

## 2018-01-20 VITALS — BP 113/79 | HR 86 | Temp 97.8°F | Resp 18 | Ht 68.0 in | Wt 187.7 lb

## 2018-01-20 DIAGNOSIS — D696 Thrombocytopenia, unspecified: Secondary | ICD-10-CM | POA: Diagnosis not present

## 2018-01-20 DIAGNOSIS — Z7901 Long term (current) use of anticoagulants: Secondary | ICD-10-CM | POA: Diagnosis not present

## 2018-01-20 DIAGNOSIS — Z87891 Personal history of nicotine dependence: Secondary | ICD-10-CM | POA: Diagnosis not present

## 2018-01-20 DIAGNOSIS — Z5111 Encounter for antineoplastic chemotherapy: Secondary | ICD-10-CM | POA: Diagnosis not present

## 2018-01-20 DIAGNOSIS — R05 Cough: Secondary | ICD-10-CM | POA: Insufficient documentation

## 2018-01-20 DIAGNOSIS — R0602 Shortness of breath: Secondary | ICD-10-CM

## 2018-01-20 DIAGNOSIS — Z510 Encounter for antineoplastic radiation therapy: Secondary | ICD-10-CM | POA: Diagnosis not present

## 2018-01-20 DIAGNOSIS — Z5189 Encounter for other specified aftercare: Secondary | ICD-10-CM | POA: Diagnosis not present

## 2018-01-20 DIAGNOSIS — I82409 Acute embolism and thrombosis of unspecified deep veins of unspecified lower extremity: Secondary | ICD-10-CM | POA: Diagnosis not present

## 2018-01-20 DIAGNOSIS — K117 Disturbances of salivary secretion: Secondary | ICD-10-CM | POA: Insufficient documentation

## 2018-01-20 DIAGNOSIS — C3411 Malignant neoplasm of upper lobe, right bronchus or lung: Secondary | ICD-10-CM

## 2018-01-20 DIAGNOSIS — D649 Anemia, unspecified: Secondary | ICD-10-CM | POA: Diagnosis not present

## 2018-01-20 DIAGNOSIS — C3490 Malignant neoplasm of unspecified part of unspecified bronchus or lung: Secondary | ICD-10-CM | POA: Diagnosis present

## 2018-01-20 DIAGNOSIS — R112 Nausea with vomiting, unspecified: Secondary | ICD-10-CM | POA: Diagnosis not present

## 2018-01-20 DIAGNOSIS — Z95828 Presence of other vascular implants and grafts: Secondary | ICD-10-CM

## 2018-01-20 LAB — CBC WITH DIFFERENTIAL (CANCER CENTER ONLY)
ABS IMMATURE GRANULOCYTES: 0.44 10*3/uL — AB (ref 0.00–0.07)
BASOS ABS: 0.1 10*3/uL (ref 0.0–0.1)
Basophils Relative: 1 %
EOS ABS: 0 10*3/uL (ref 0.0–0.5)
Eosinophils Relative: 0 %
HCT: 27 % — ABNORMAL LOW (ref 39.0–52.0)
Hemoglobin: 9 g/dL — ABNORMAL LOW (ref 13.0–17.0)
Immature Granulocytes: 4 %
Lymphocytes Relative: 3 %
Lymphs Abs: 0.4 10*3/uL — ABNORMAL LOW (ref 0.7–4.0)
MCH: 28.3 pg (ref 26.0–34.0)
MCHC: 33.3 g/dL (ref 30.0–36.0)
MCV: 84.9 fL (ref 80.0–100.0)
Monocytes Absolute: 1.2 10*3/uL — ABNORMAL HIGH (ref 0.1–1.0)
Monocytes Relative: 11 %
Neutro Abs: 8.8 10*3/uL — ABNORMAL HIGH (ref 1.7–7.7)
Neutrophils Relative %: 81 %
Platelet Count: 250 10*3/uL (ref 150–400)
RBC: 3.18 MIL/uL — ABNORMAL LOW (ref 4.22–5.81)
RDW: 16.1 % — ABNORMAL HIGH (ref 11.5–15.5)
WBC: 10.9 10*3/uL — AB (ref 4.0–10.5)
nRBC: 0 % (ref 0.0–0.2)

## 2018-01-20 LAB — CMP (CANCER CENTER ONLY)
ALBUMIN: 3.4 g/dL — AB (ref 3.5–5.0)
ALT: 11 U/L (ref 0–44)
AST: 11 U/L — ABNORMAL LOW (ref 15–41)
Alkaline Phosphatase: 73 U/L (ref 38–126)
Anion gap: 9 (ref 5–15)
BUN: 10 mg/dL (ref 8–23)
CO2: 23 mmol/L (ref 22–32)
CREATININE: 0.76 mg/dL (ref 0.61–1.24)
Calcium: 9.1 mg/dL (ref 8.9–10.3)
Chloride: 102 mmol/L (ref 98–111)
GFR, Est AFR Am: >60 mL/min (ref >60)
GFR, Estimated: >60 mL/min (ref >60)
Glucose, Bld: 134 mg/dL — ABNORMAL HIGH (ref 70–99)
Potassium: 3.8 mmol/L (ref 3.5–5.1)
Sodium: 134 mmol/L — ABNORMAL LOW (ref 135–145)
Total Bilirubin: 0.4 mg/dL (ref 0.3–1.2)
Total Protein: 6.4 g/dL — ABNORMAL LOW (ref 6.5–8.1)

## 2018-01-20 LAB — MAGNESIUM: Magnesium: 1.7 mg/dL (ref 1.7–2.4)

## 2018-01-20 MED ORDER — IOHEXOL 300 MG/ML  SOLN
75.0000 mL | Freq: Once | INTRAMUSCULAR | Status: AC | PRN
  Administered 2018-01-20: 75 mL via INTRAVENOUS

## 2018-01-20 MED ORDER — SODIUM CHLORIDE (PF) 0.9 % IJ SOLN
INTRAMUSCULAR | Status: AC
  Filled 2018-01-20: qty 50

## 2018-01-20 MED ORDER — SODIUM CHLORIDE 0.9% FLUSH
10.0000 mL | INTRAVENOUS | Status: DC | PRN
  Administered 2018-01-20: 10 mL
  Filled 2018-01-20: qty 10

## 2018-01-20 MED ORDER — HEPARIN SOD (PORK) LOCK FLUSH 100 UNIT/ML IV SOLN
500.0000 [IU] | Freq: Once | INTRAVENOUS | Status: AC | PRN
  Administered 2018-01-20: 500 [IU]
  Filled 2018-01-20: qty 5

## 2018-01-20 NOTE — Patient Instructions (Signed)
Constipation Management  Magnesium Citrate, drink 1/2 bottle, drink remainder if no bowel movement with 30 to 60 minutes  Or  30 mg (1 tablespoon) of Milk of Magnesia in 8 ounces of prune juice, warm in microwave for 20 seconds    Begin the following after you have had a bowel movement:  Senna-S, 1 to 2 tablets twice daily  MiraLAX 17 grams in 8 ounces of liquids 1 to 2 times daily as needed   Remember to remain well hydrated. Drink, Drink, Drink non-caffeinated beverages.   Adjust these medications based on your response. If your bowel movements become too loose then decrease the amount of Senna-S and/or MiraLAX that you are using. If your bowel movements become too firm or are difficult to pass, the increase the amount of Senna-S and/or MiraLAX that you are using and increase your intake of water.

## 2018-01-20 NOTE — Telephone Encounter (Signed)
We do not have samples of Eliquis. I tried to call pt but a fax machine came on. I will try again later.

## 2018-01-20 NOTE — Progress Notes (Signed)
Pt seen by PA Van only, no RN assessment at this time.  PA aware. 

## 2018-01-21 ENCOUNTER — Ambulatory Visit: Payer: Medicare Other

## 2018-01-21 ENCOUNTER — Inpatient Hospital Stay (HOSPITAL_BASED_OUTPATIENT_CLINIC_OR_DEPARTMENT_OTHER): Payer: Medicare Other | Admitting: Internal Medicine

## 2018-01-21 ENCOUNTER — Telehealth: Payer: Self-pay | Admitting: Internal Medicine

## 2018-01-21 ENCOUNTER — Inpatient Hospital Stay: Payer: Medicare Other

## 2018-01-21 ENCOUNTER — Ambulatory Visit: Payer: Medicare Other | Admitting: Nurse Practitioner

## 2018-01-21 ENCOUNTER — Ambulatory Visit
Admission: RE | Admit: 2018-01-21 | Discharge: 2018-01-21 | Disposition: A | Discharge status: Home / Self Care | Payer: Medicare Other | Source: Ambulatory Visit | Attending: Radiation Oncology | Admitting: Radiation Oncology

## 2018-01-21 ENCOUNTER — Other Ambulatory Visit: Payer: Medicare Other

## 2018-01-21 ENCOUNTER — Other Ambulatory Visit: Payer: Self-pay | Admitting: *Deleted

## 2018-01-21 ENCOUNTER — Ambulatory Visit: Payer: Medicare Other | Admitting: Medical

## 2018-01-21 ENCOUNTER — Encounter: Payer: Self-pay | Admitting: Internal Medicine

## 2018-01-21 VITALS — BP 144/91 | HR 72 | Temp 98.3°F | Resp 16

## 2018-01-21 DIAGNOSIS — R5383 Other fatigue: Secondary | ICD-10-CM

## 2018-01-21 DIAGNOSIS — C3411 Malignant neoplasm of upper lobe, right bronchus or lung: Secondary | ICD-10-CM

## 2018-01-21 DIAGNOSIS — Z5111 Encounter for antineoplastic chemotherapy: Secondary | ICD-10-CM

## 2018-01-21 DIAGNOSIS — Z510 Encounter for antineoplastic radiation therapy: Secondary | ICD-10-CM | POA: Diagnosis not present

## 2018-01-21 DIAGNOSIS — J449 Chronic obstructive pulmonary disease, unspecified: Secondary | ICD-10-CM

## 2018-01-21 DIAGNOSIS — R110 Nausea: Secondary | ICD-10-CM | POA: Diagnosis not present

## 2018-01-21 DIAGNOSIS — C3490 Malignant neoplasm of unspecified part of unspecified bronchus or lung: Secondary | ICD-10-CM

## 2018-01-21 MED ORDER — POTASSIUM CHLORIDE 2 MEQ/ML IV SOLN
Freq: Once | INTRAVENOUS | Status: AC
  Administered 2018-01-21: 09:00:00 via INTRAVENOUS
  Filled 2018-01-21: qty 10

## 2018-01-21 MED ORDER — SODIUM CHLORIDE 0.9 % IV SOLN
80.0000 mg/m2 | Freq: Once | INTRAVENOUS | Status: AC
  Administered 2018-01-21: 168 mg via INTRAVENOUS
  Filled 2018-01-21: qty 168

## 2018-01-21 MED ORDER — FAMOTIDINE IN NACL 20-0.9 MG/50ML-% IV SOLN
20.0000 mg | Freq: Once | INTRAVENOUS | Status: AC
  Administered 2018-01-21: 20 mg via INTRAVENOUS

## 2018-01-21 MED ORDER — HEPARIN SOD (PORK) LOCK FLUSH 100 UNIT/ML IV SOLN
500.0000 [IU] | Freq: Once | INTRAVENOUS | Status: AC | PRN
  Administered 2018-01-21: 500 [IU]
  Filled 2018-01-21: qty 5

## 2018-01-21 MED ORDER — SODIUM CHLORIDE 0.9 % IV SOLN
Freq: Once | INTRAVENOUS | Status: AC
  Administered 2018-01-21: 08:00:00 via INTRAVENOUS
  Filled 2018-01-21: qty 250

## 2018-01-21 MED ORDER — SODIUM CHLORIDE 0.9 % IV SOLN
Freq: Once | INTRAVENOUS | Status: AC
  Administered 2018-01-21: 11:00:00 via INTRAVENOUS
  Filled 2018-01-21: qty 5

## 2018-01-21 MED ORDER — PALONOSETRON HCL INJECTION 0.25 MG/5ML
INTRAVENOUS | Status: AC
  Filled 2018-01-21: qty 5

## 2018-01-21 MED ORDER — PALONOSETRON HCL INJECTION 0.25 MG/5ML
0.2500 mg | Freq: Once | INTRAVENOUS | Status: AC
  Administered 2018-01-21: 0.25 mg via INTRAVENOUS

## 2018-01-21 MED ORDER — DIPHENHYDRAMINE HCL 50 MG/ML IJ SOLN
25.0000 mg | Freq: Once | INTRAMUSCULAR | Status: AC
  Administered 2018-01-21: 25 mg via INTRAVENOUS

## 2018-01-21 MED ORDER — SODIUM CHLORIDE 0.9% FLUSH
10.0000 mL | INTRAVENOUS | Status: DC | PRN
  Administered 2018-01-21: 10 mL
  Filled 2018-01-21: qty 10

## 2018-01-21 MED ORDER — SODIUM CHLORIDE 0.9 % IV SOLN
95.0000 mg/m2 | Freq: Once | INTRAVENOUS | Status: AC
  Administered 2018-01-21: 200 mg via INTRAVENOUS
  Filled 2018-01-21: qty 10

## 2018-01-21 MED ORDER — LIDOCAINE-PRILOCAINE 2.5-2.5 % EX CREA: TOPICAL_CREAM | CUTANEOUS | 0 refills | Status: AC

## 2018-01-21 NOTE — Telephone Encounter (Signed)
Added another cycle per 1/8 los - pt to get an updated schedule next visit.

## 2018-01-21 NOTE — Progress Notes (Signed)
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  PRIOR THERAPY: None  CURRENT THERAPY: Cisplatin 80 mg meter squared on day 1 with etoposide 100 mg/m2 days 1, 2, and 3 every 3 weeks.  This will be given concurrently with radiation.  Cycle 1 started on 12/10/2017.  Radiation to begin 12/30/2017.  Status post 2 cycles.  INTERVAL HISTORY: Scott Gomez 84 y.o. male returns to the clinic today for follow-up visit accompanied by his wife.  The patient is feeling fine today with no concerning complaints except for mild fatigue.  He tolerated the second cycle of his systemic chemotherapy with cisplatin and etoposide fairly well.  He has few episodes of nausea after his treatment.  He also gets very anxious during his treatment and required Ativan for anxiety.  He denied having any current chest pain, shortness of breath, cough or hemoptysis.  He denied having any current nausea, vomiting, diarrhea or constipation.  He has no headache or visual changes.  The patient had repeat CT scan of the chest performed recently and is here for evaluation and discussion of his scan results.  MEDICAL HISTORY: Past Medical History:  Diagnosis Date  . Anxiety   . Arthritis    osteoarthritis both hands  . Carpal tunnel syndrome   . COPD (chronic obstructive pulmonary disease) ([place])   . CPAP (continuous positive airway pressure) dependence   . Diabetes mellitus    type II  . Fatigue   . History of kidney stones   . Hypercholesteremia   . Hypopotassemia   . Leg cramps   . Lung cancer ([place])   . Nephrolithiasis    Hx of  . Palpitations    Hx of   .  Shoulder pain, left   . Snoring   . Testosterone deficiency   . Ulcer    peptic ulcer disease    ALLERGIES:  is allergic to buspirone hcl; crestor [rosuvastatin calcium]; and prednisone.  MEDICATIONS:  Current Outpatient Medications  Medication Sig Dispense Refill  . albuterol (PROAIR HFA) 108 (90 Base) MCG/ACT inhaler Inhale 2 puffs into the lungs every 4 (four) hours as needed for wheezing or shortness of breath. 1 Inhaler 2  . ALPRAZolam (XANAX) 0.5 MG tablet TAKE ONE (1) TABLET BY MOUTH TWO (2) TIMES DAILY AS NEEDED FOR ANXIETY 60 tablet 3  . apixaban (ELIQUIS) 5 MG TABS tablet Take 1 tablet (5 mg total) by mouth 2 (two) times daily. 28 tablet 0  . Blood Glucose Monitoring Suppl (ONETOUCH VERIO IQ SYSTEM) w/Device KIT Use to test blood sugar once daily and as needed dx R73.9 1 kit 0  . cyclobenzaprine (FLEXERIL) 10 MG tablet Take 10 mg by mouth 2 (two) times daily.     Marland Kitchen docusate sodium (COLACE) 100 MG capsule Take 100 mg by mouth daily.    Marland Kitchen gabapentin (NEURONTIN) 100 MG capsule Take 200 mg by mouth 3 (three) times daily.     Marland Kitchen glucose blood (ONETOUCH VERIO) test strip USE TO CHECK BLOOD SUGAR ONCE DAILY 100 each 3  . glucose blood test strip Use as instructed 100 each 3  . levalbuterol (XOPENEX) 0.63 MG/3ML nebulizer solution Take 3 mLs (0.63  mg total) by nebulization every 4 (four) hours as needed for wheezing or shortness of breath. 3 mL 12  . lidocaine-prilocaine (EMLA) cream Apply to port 1 hour prior port access 30 g 0  . lisinopril (PRINIVIL,ZESTRIL) 10 MG tablet Take 1 tablet (10 mg total) by mouth daily. (Patient taking differently: Take 5 mg by mouth daily. ) 90 tablet 3  . lubiprostone (AMITIZA) 24 MCG capsule Take 1 capsule (24 mcg total) by mouth 2 (two) times daily with a meal. 60 capsule 3  . magnesium oxide (MAG-OX) 400 (241.3 Mg) MG tablet Take 1 tablet (400 mg total) by mouth daily. 30 tablet 1  . ondansetron (ZOFRAN) 8 MG tablet Take 1 tablet (8 mg total) by mouth  every 8 (eight) hours as needed for nausea or vomiting. 30 tablet 1  . ONE TOUCH LANCETS MISC Use to test blood sugar once daily and as needed. Dx 790.29 200 each 1  . Oxycodone HCl 10 MG TABS Take 1 tablet by mouth every 6 (six) hours as needed (pain).     . pantoprazole (PROTONIX) 40 MG tablet Take 1 tablet (40 mg total) by mouth 2 (two) times daily. 180 tablet 3  . PARoxetine (PAXIL) 10 MG tablet Take 1 tablet (10 mg total) by mouth daily. 30 tablet 11  . potassium chloride SA (K-DUR,KLOR-CON) 20 MEQ tablet TAKE ONE (1) TABLET BY MOUTH EVERY DAY (Patient taking differently: Take 20 mEq by mouth daily. ) 90 tablet 3  . prochlorperazine (COMPAZINE) 10 MG tablet TAKE 1 TABLET (10 MG TOTAL) BY MOUTH EVERY 6 (SIX) HOURS AS NEEDED FOR NAUSEA OR VOMITING. 30 tablet 0  . sucralfate (CARAFATE) 1 g tablet Take 1 tablet (1 g total) by mouth 4 (four) times daily -  with meals and at bedtime. 5 min before meals for radiation induced esophagitis 120 tablet 2  . Tiotropium Bromide Monohydrate (SPIRIVA RESPIMAT) 2.5 MCG/ACT AERS Inhale 2 puffs into the lungs daily. (Patient taking differently: Inhale 2 puffs into the lungs daily as needed (wheezing). ) 4 g 2  . travoprost, benzalkonium, (TRAVATAN) 0.004 % ophthalmic solution Place 1 drop into both eyes at bedtime.      . vitamin B-12 (CYANOCOBALAMIN) 1000 MCG tablet Take 1,000 mcg by mouth daily.     No current facility-administered medications for this visit.    Facility-Administered Medications Ordered in Other Visits  Medication Dose Route Frequency Provider Last Rate Last Dose  . CISplatin (PLATINOL) 168 mg in sodium chloride 0.9 % 500 mL chemo infusion  80 mg/m2 (Treatment Plan Recorded) Intravenous Once Curt Bears, MD      . etoposide (VEPESID) 200 mg in sodium chloride 0.9 % 500 mL chemo infusion  95 mg/m2 (Treatment Plan Recorded) Intravenous Once Curt Bears, MD      . fosaprepitant (EMEND) 150 mg, dexamethasone (DECADRON) 12 mg in sodium  chloride 0.9 % 145 mL IVPB   Intravenous Once Curt Bears, MD 454 mL/hr at 01/21/18 1103    . heparin lock flush 100 unit/mL  500 Units Intracatheter Once PRN Curt Bears, MD      . sodium chloride flush (NS) 0.9 % injection 10 mL  10 mL Intracatheter PRN Curt Bears, MD        SURGICAL HISTORY:  Past Surgical History:  Procedure Laterality Date  . BACK SURGERY  1978   x2 disc  . IR IMAGING GUIDED PORT INSERTION  12/19/2017    REVIEW OF SYSTEMS:  Constitutional: positive for fatigue Eyes: negative Ears,  nose, mouth, throat, and face: negative Respiratory: negative Cardiovascular: negative Gastrointestinal: negative Genitourinary:negative Integument/breast: negative Hematologic/lymphatic: negative Musculoskeletal:negative Neurological: negative Behavioral/Psych: negative Endocrine: negative Allergic/Immunologic: negative   PHYSICAL EXAMINATION: General appearance: alert, cooperative, fatigued and no distress Head: Normocephalic, without obvious abnormality, atraumatic Neck: no adenopathy, no JVD, supple, symmetrical, trachea midline and thyroid not enlarged, symmetric, no tenderness/mass/nodules Lymph nodes: Cervical, supraclavicular, and axillary nodes normal. Resp: clear to auscultation bilaterally Back: symmetric, no curvature. ROM normal. No CVA tenderness. Cardio: regular rate and rhythm, S1, S2 normal, no murmur, click, rub or gallop GI: soft, non-tender; bowel sounds normal; no masses,  no organomegaly Extremities: extremities normal, atraumatic, no cyanosis or edema Neurologic: Alert and oriented X 3, normal strength and tone. Normal symmetric reflexes. Normal coordination and gait  ECOG PERFORMANCE STATUS: 1 - Symptomatic but completely ambulatory  There were no vitals taken for this visit.  LABORATORY DATA: Lab Results  Component Value Date   WBC 10.9 (H) 01/20/2018   HGB 9.0 (L) 01/20/2018   HCT 27.0 (L) 01/20/2018   MCV 84.9 01/20/2018   PLT  250 01/20/2018      Chemistry      Component Value Date/Time   NA 134 (L) 01/20/2018 0920   K 3.8 01/20/2018 0920   CL 102 01/20/2018 0920   CO2 23 01/20/2018 0920   BUN 10 01/20/2018 0920   CREATININE 0.76 01/20/2018 0920      Component Value Date/Time   CALCIUM 9.1 01/20/2018 0920   ALKPHOS 73 01/20/2018 0920   AST 11 (L) 01/20/2018 0920   ALT 11 01/20/2018 0920   BILITOT 0.4 01/20/2018 0920       RADIOGRAPHIC STUDIES: Ct Chest W Contrast  Result Date: 01/20/2018 CLINICAL DATA:  84 year old male with history of right-sided lung cancer diagnosed in November 2019 treated with chemotherapy and radiation therapy. 13 pound weight loss over the past 2 months. Dyspnea on exertion. EXAM: CT CHEST WITH CONTRAST TECHNIQUE: Multidetector CT imaging of the chest was performed during intravenous contrast administration. CONTRAST:  77m OMNIPAQUE IOHEXOL 300 MG/ML  SOLN COMPARISON:  Chest CT 12/16/2017. FINDINGS: Cardiovascular: Heart size is normal. Small amount of pericardial fluid and/or thickening, similar to the prior study and unlikely to be of hemodynamic significance at this time. No associated pericardial calcification. There is aortic atherosclerosis, as well as atherosclerosis of the great vessels of the mediastinum and the coronary arteries, including calcified atherosclerotic plaque in the left main, left anterior descending, left circumflex and right coronary arteries. In addition, there is aneurysmal dilatation of the ascending thoracic aorta which measures up to 4.6 cm in diameter. Moderate calcifications of the aortic valve. Left-sided internal jugular single-lumen porta cath with tip terminating at the superior cavoatrial junction. Mediastinum/Nodes: There continues to be extensive right hilar and mediastinal lymphadenopathy, resulting in a confluent soft tissue mass extending from the superior mediastinum around the origin of the right common carotid artery and innominate artery down  to the level of the subcarinal region, involving all intervening nodal stations and extending into the right hilar region. This area is very irregular in shape and therefore difficult to accurately measure, however, the mass has clearly decreased in size compared to the prior study. This is best demonstrated in a relatively avascular region of the mass which currently measures approximately 5.2 x 3.4 cm (axial image 37 of series 2) as compared with 5.8 x 4.8 cm when measured in a similar fashion in the same region on axial image 14 of series 2 of  prior study 12/16/2017. This mass continues to exert local mass effect upon adjacent structures causing moderate to severe narrowing of the right internal jugular vein. Superior vena cava remains widely patent at this time. Esophagus is unremarkable in appearance. No axillary lymphadenopathy. Lungs/Pleura: Continued decrease in size of the primary right upper lobe lesion which currently measures 1.6 x 1.7 cm (axial image 26 of series 5). There are new patchy multifocal areas of peribronchovascular ground-glass attenuation scattered throughout the posterior aspect of the right upper lobe adjacent to the lesion and more laterally, presumably reflective of evolving postradiation changes. No new suspicious appearing pulmonary nodules or masses are noted. Trace right pleural effusion lying dependently, decreased compared to the prior study. No left pleural effusion. Upper Abdomen: Aortic atherosclerosis. Musculoskeletal: Old chronic L1 compression fracture with 60% loss of anterior vertebral body height. There are no aggressive appearing lytic or blastic lesions noted in the visualized portions of the skeleton. IMPRESSION: 1. Today's study demonstrates a positive response to therapy with decreased size of the primary right upper lobe lesion as well as the right hilar and mediastinal nodal mass, as detailed above. 2. Decreasing trace right pleural effusion. 3. Evolving  postradiation changes in the right upper lobe, as above. 4. Aortic atherosclerosis, in addition to left main and 3 vessel coronary artery disease. 5. In addition, there is aneurysmal dilatation of the ascending thoracic aorta which measures 4.6 cm in diameter. Ascending thoracic aortic aneurysm. Recommend semi-annual imaging followup by CTA or MRA and referral to cardiothoracic surgery if not already obtained. This recommendation follows 2010 ACCF/AHA/AATS/ACR/ASA/SCA/SCAI/SIR/STS/SVM Guidelines for the Diagnosis and Management of Patients With Thoracic Aortic Disease. Circulation. 2010; 121: H474-Q595. Aortic Atherosclerosis (ICD10-I70.0). Electronically Signed   By: Vinnie Langton M.D.   On: 01/20/2018 09:44   Mr Jeri Cos GL Contrast  Result Date: 12/27/2017 CLINICAL DATA:  84 year old male with small cell lung cancer. Staging. Nausea and difficulty walking. EXAM: MRI HEAD WITHOUT AND WITH CONTRAST TECHNIQUE: Multiplanar, multiecho pulse sequences of the brain and surrounding structures were obtained without and with intravenous contrast. CONTRAST:  23m MULTIHANCE GADOBENATE DIMEGLUMINE 529 MG/ML IV SOLN COMPARISON:  Face CT 07/30/2015.  Brain MRI 01/01/2011. FINDINGS: Brain: There is a small but suspicious 3 millimeter rounded area of enhancement in the left periventricular white matter on series 13, image 94 and series 15, image 16. And there is a 2nd similar small but suspicious focus of enhancement in the lower right cerebellum along the lateral aspect of the vermis on series 13, image 31. No associated edema or mass effect at the sites. No other abnormal parenchymal enhancement identified. No dural thickening. No restricted diffusion to suggest acute infarction. No midline shift, mass effect, ventriculomegaly, extra-axial collection or acute intracranial hemorrhage. Cervicomedullary junction and pituitary are within normal limits. No chronic cerebral blood products or cortical encephalomalacia  identified. Scattered small but occasionally patchy cerebral white matter T2 and FLAIR hyperintensity in both hemispheres is similar to that in 2012. Vascular: Major intracranial vascular flow voids are stable since 2012. the major dural venous sinuses are enhancing and appear to be patent. Skull and upper cervical spine: Visualized bone marrow signal is within normal limits. Sinuses/Orbits: Stable and negative. Other: Mastoid air cells are clear. Visible internal auditory structures appear normal. Occasional small cervical lymph nodes to do not appear enlarged by CT criteria. Scalp and face soft tissues appear negative. IMPRESSION: 1. 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 No associated edema or mass effect. 2. No other acute intracranial abnormality. Chronic cerebral white matter signal changes which are probably small vessel disease related. Electronically Signed   By: Genevie Ann M.D.   On: 12/27/2017 20:01    ASSESSMENT AND PLAN: This is a very pleasant 84 years old white male recently diagnosed with limited stage small cell lung cancer presented with large perihilar mass in addition to bilateral hilar and mediastinal lymphadenopathy.  The patient was also found on recent MRI of the brain to have suspicious early metastatic disease to the brain which can make his final staging as extensive stage disease. I discussed the MRI results with the patient and his family. He was a started on systemic chemotherapy with cisplatin and etoposide status post 2 cycles. He tolerated the last cycle of his treatment well with no concerning adverse effects. The patient had repeat CT scan of the chest performed recently.  I personally and independently reviewed the scan images and discussed the result and showed the images to the patient and his wife. His  scan showed significant improvement of his disease after the first 2 cycles of his treatment. I recommended for the patient to continue on his current systemic chemotherapy with cisplatin and etoposide and he will proceed with cycle #3 today. He will come back for follow-up visit in 3 weeks for evaluation before starting cycle #4. For the nausea the patient will continue his current treatment with Compazine. He was advised to call immediately if he has any concerning symptoms in the interval. The patient voices understanding of current disease status and treatment options and is in agreement with the current care plan.  All questions were answered. The patient knows to call the clinic with any problems, questions or concerns. We can certainly see the patient much sooner if necessary.  Disclaimer: This note was dictated with voice recognition software. Similar sounding words can inadvertently be transcribed and may not be corrected upon review.

## 2018-01-21 NOTE — Progress Notes (Signed)
Shortly after etoposide infusion began, patient exhibited bright red facial flushing and feeling of impending doom. Etoposide infusion stopped and hypersensitivity protocol initiated and patient medicated as documented in [facility]. Sandi Mealy, PA-C came to infusion room to see patient. Patient rapidly improved and returned to baseline before resuming infusion. Etoposide resumed at 50% reduced rate, and then increased to prescribed rate. Etoposide infusion completed without further incident.

## 2018-01-21 NOTE — Telephone Encounter (Signed)
LMTCB

## 2018-01-21 NOTE — Patient Instructions (Signed)
[facility] Discharge Instructions for Patients Receiving Chemotherapy  Today you received the following chemotherapy agents Etoposide; Cisplatin  To help prevent nausea and vomiting after your treatment, we encourage you to take your nausea medication as directed.  If you develop nausea and vomiting that is not controlled by your nausea medication, call the clinic.   BELOW ARE SYMPTOMS THAT SHOULD BE REPORTED IMMEDIATELY:  *FEVER GREATER THAN 100.5 F  *CHILLS WITH OR WITHOUT FEVER  NAUSEA AND VOMITING THAT IS NOT CONTROLLED WITH YOUR NAUSEA MEDICATION  *UNUSUAL SHORTNESS OF BREATH  *UNUSUAL BRUISING OR BLEEDING  TENDERNESS IN MOUTH AND THROAT WITH OR WITHOUT PRESENCE OF ULCERS  *URINARY PROBLEMS  *BOWEL PROBLEMS  UNUSUAL RASH Items with * indicate a potential emergency and should be followed up as soon as possible.  Feel free to call the clinic should you have any questions or concerns. The clinic phone number is [phone].  Please show the [facility] at check-in to the Emergency Department and triage nurse.

## 2018-01-22 ENCOUNTER — Inpatient Hospital Stay: Payer: Medicare Other | Admitting: Nutrition

## 2018-01-22 ENCOUNTER — Inpatient Hospital Stay (HOSPITAL_BASED_OUTPATIENT_CLINIC_OR_DEPARTMENT_OTHER): Payer: Medicare Other | Admitting: Medical

## 2018-01-22 ENCOUNTER — Ambulatory Visit: Payer: Medicare Other

## 2018-01-22 ENCOUNTER — Inpatient Hospital Stay: Payer: Medicare Other

## 2018-01-22 ENCOUNTER — Ambulatory Visit
Admission: RE | Admit: 2018-01-22 | Discharge: 2018-01-22 | Disposition: A | Discharge status: Home / Self Care | Payer: Medicare Other | Source: Ambulatory Visit | Attending: Radiation Oncology | Admitting: Radiation Oncology

## 2018-01-22 VITALS — BP 126/81 | HR 72 | Temp 98.5°F | Resp 16

## 2018-01-22 DIAGNOSIS — C3411 Malignant neoplasm of upper lobe, right bronchus or lung: Secondary | ICD-10-CM

## 2018-01-22 DIAGNOSIS — T8090XA Unspecified complication following infusion and therapeutic injection, initial encounter: Secondary | ICD-10-CM | POA: Diagnosis not present

## 2018-01-22 DIAGNOSIS — Z5111 Encounter for antineoplastic chemotherapy: Secondary | ICD-10-CM | POA: Diagnosis not present

## 2018-01-22 DIAGNOSIS — Z510 Encounter for antineoplastic radiation therapy: Secondary | ICD-10-CM | POA: Diagnosis not present

## 2018-01-22 MED ORDER — LORAZEPAM 2 MG/ML IJ SOLN
INTRAMUSCULAR | Status: AC
  Filled 2018-01-22: qty 1

## 2018-01-22 MED ORDER — FAMOTIDINE IN NACL 20-0.9 MG/50ML-% IV SOLN
20.0000 mg | Freq: Once | INTRAVENOUS | Status: AC
  Administered 2018-01-22: 20 mg via INTRAVENOUS

## 2018-01-22 MED ORDER — FAMOTIDINE IN NACL 20-0.9 MG/50ML-% IV SOLN
INTRAVENOUS | Status: AC
  Filled 2018-01-22: qty 50

## 2018-01-22 MED ORDER — DIPHENHYDRAMINE HCL 25 MG PO CAPS
ORAL_CAPSULE | ORAL | Status: AC
  Filled 2018-01-22: qty 1

## 2018-01-22 MED ORDER — SODIUM CHLORIDE 0.9% FLUSH
10.0000 mL | Freq: Once | INTRAVENOUS | Status: AC
  Administered 2018-01-22: 10 mL via INTRAVENOUS
  Filled 2018-01-22: qty 10

## 2018-01-22 MED ORDER — DEXAMETHASONE SODIUM PHOSPHATE 10 MG/ML IJ SOLN
INTRAMUSCULAR | Status: AC
  Filled 2018-01-22: qty 1

## 2018-01-22 MED ORDER — SODIUM CHLORIDE 0.9 % IV SOLN
95.0000 mg/m2 | Freq: Once | INTRAVENOUS | Status: AC
  Administered 2018-01-22: 200 mg via INTRAVENOUS
  Filled 2018-01-22: qty 10

## 2018-01-22 MED ORDER — DIPHENHYDRAMINE HCL 50 MG/ML IJ SOLN
25.0000 mg | Freq: Once | INTRAMUSCULAR | Status: AC
  Administered 2018-01-22: 25 mg via INTRAVENOUS

## 2018-01-22 MED ORDER — DEXAMETHASONE SODIUM PHOSPHATE 10 MG/ML IJ SOLN
10.0000 mg | Freq: Once | INTRAMUSCULAR | Status: AC
  Administered 2018-01-22: 10 mg via INTRAVENOUS

## 2018-01-22 MED ORDER — DIPHENHYDRAMINE HCL 25 MG PO CAPS
25.0000 mg | ORAL_CAPSULE | Freq: Once | ORAL | Status: AC
  Administered 2018-01-22: 25 mg via ORAL

## 2018-01-22 MED ORDER — HEPARIN SOD (PORK) LOCK FLUSH 100 UNIT/ML IV SOLN
500.0000 [IU] | Freq: Once | INTRAVENOUS | Status: AC
  Administered 2018-01-22: 500 [IU] via INTRAVENOUS
  Filled 2018-01-22: qty 5

## 2018-01-22 MED ORDER — SODIUM CHLORIDE 0.9 % IV SOLN
Freq: Once | INTRAVENOUS | Status: AC
  Administered 2018-01-22: 13:00:00 via INTRAVENOUS
  Filled 2018-01-22: qty 250

## 2018-01-22 MED ORDER — LORAZEPAM 2 MG/ML IJ SOLN
0.5000 mg | Freq: Once | INTRAMUSCULAR | Status: AC
  Administered 2018-01-22: 0.5 mg via INTRAVENOUS

## 2018-01-22 NOTE — Telephone Encounter (Signed)
Patient's wife returning your phone call.  Phone number is [phone].

## 2018-01-22 NOTE — Telephone Encounter (Signed)
Patient returned call, CB is [phone]

## 2018-01-22 NOTE — Patient Instructions (Signed)
[facility] Discharge Instructions for Patients Receiving Chemotherapy  Today you received the following chemotherapy agents Etoposide  To help prevent nausea and vomiting after your treatment, we encourage you to take your nausea medication as directed.  If you develop nausea and vomiting that is not controlled by your nausea medication, call the clinic.   BELOW ARE SYMPTOMS THAT SHOULD BE REPORTED IMMEDIATELY:  *FEVER GREATER THAN 100.5 F  *CHILLS WITH OR WITHOUT FEVER  NAUSEA AND VOMITING THAT IS NOT CONTROLLED WITH YOUR NAUSEA MEDICATION  *UNUSUAL SHORTNESS OF BREATH  *UNUSUAL BRUISING OR BLEEDING  TENDERNESS IN MOUTH AND THROAT WITH OR WITHOUT PRESENCE OF ULCERS  *URINARY PROBLEMS  *BOWEL PROBLEMS  UNUSUAL RASH Items with * indicate a potential emergency and should be followed up as soon as possible.  Feel free to call the clinic should you have any questions or concerns. The clinic phone number is [phone].  Please show the [facility] at check-in to the Emergency Department and triage nurse.

## 2018-01-22 NOTE — Progress Notes (Signed)
On 01/21/2018, patient experienced a hypersensitivity reaction to etoposide. Discussed situation with Dr. Julien Nordmann. Verbal orders for famotidine 20 IVPB, diphenhydramine 25 mg po, and lorazepam 0.5 mg IV prior to future etoposide treatments. Orders repeated and confirmed.

## 2018-01-22 NOTE — Progress Notes (Signed)
84 year old male diagnosed with small cell lung cancer.  Past medical history includes anxiety, diabetes, fatigue, hypercholesterolemia, and PUD.  Medications include Xanax, Colace, Zofran, Protonix, Compazine, and vitamin B12.  Labs include sodium 134, glucose 134 and albumin 3.4.  Height: 68 inches. Weight: 187.7 pounds January 7.  182 pounds December 27. Usual body weight: 221 pounds in April. BMI: 28.54.  Patient reports his appetite is decreased. He denies difficulty chewing and swallowing. Reports he likes soup and Jell-O. Reports he had nausea after chemotherapy with cisplatin and etoposide. Reports he did have constipation however it was relieved with an enema. Patient reports trying oral nutrition supplements but dislikes them. Unable to complete nutrition focused physical exam as nurse beginning chemotherapy.  Nutrition diagnosis:  Unintended weight loss related to small cell lung cancer and associated treatments as evidenced by 15% weight loss since April 2019.  Intervention: Educated patient to consume smaller more frequent meals and snacks. Reviewed importance of high-protein foods.  I provided a fact sheet. Encourage patient to try to find an oral nutrition shake he prefers.  Explained this is an easy way for him to get extra calories and protein to minimize weight loss. Brief strategies were reviewed to avoid constipation in the future. Encouraged additional water intake. Questions were answered.  Teach back method used.  Contact information provided.  Monitoring, evaluation, goals: Patient will tolerate adequate calories and protein to minimize further weight loss.  Next visit: Tuesday, January 28 during infusion.  **Disclaimer: This note was dictated with voice recognition software. Similar sounding words can inadvertently be transcribed and this note may contain transcription errors which may not have been corrected upon publication of note.**

## 2018-01-22 NOTE — Progress Notes (Signed)
Shortly after infusion began of Etoposide, patient experienced facial redness and shortness of breath. Infusion stopped and emergency protocols initiated. The patient's symptoms improved and returned to baseline.  Lucianne Lei PA, assessed and spoke with Dr. Julien Nordmann. Per Dr. Worthy Flank orders Etoposide infusion restarted at half the prescribed rate.

## 2018-01-22 NOTE — Telephone Encounter (Addendum)
Spoke with pt's wife and advised her that we have no Eliquis samples. She wanted to know if there was anything else he can take because they cannot afford the medication at this time. According to the chart it looks like Beth suggested patient assistance for Eliquis.   Blood clot: Continue Eliquis 5mg  twice daily, we will try and apply for assistance program  COPD: Continue Spiriva 2 puffs daily  (samples given)  Peptic ulcer/duodenitis: Continue Protonix  Zofran as needed for nausea  Sleep apnea: Continue to wear CPAP every night for 4-6 hours Do not drive if experiencing excessive daytime fatigue or somnolence  Nutrition recommendations: Continue eating soft solids, focus on high protein foods and encourage freq snacks Boost/ensure shakes also good source of protein and calories  Encouraged oral nutrition supplements to add additional calories and protein Encouraged adding snack or noon time meal as often skips during the day  Follow up: 2 months with [name] or sooner if develops worsening shortness of breath or cough   Referral: Ambulatory referral to case management for evaluation and medication assistance

## 2018-01-22 NOTE — Telephone Encounter (Signed)
I called pt's wife back but the line rang busy X 3 and I wasn't able to leave a message. Will call back later

## 2018-01-22 NOTE — Telephone Encounter (Signed)
LMTCB

## 2018-01-23 ENCOUNTER — Telehealth: Payer: Self-pay | Admitting: Pulmonary Disease

## 2018-01-23 ENCOUNTER — Ambulatory Visit: Payer: Medicare Other

## 2018-01-23 ENCOUNTER — Ambulatory Visit
Admission: RE | Admit: 2018-01-23 | Discharge: 2018-01-23 | Disposition: A | Discharge status: Home / Self Care | Payer: Medicare Other | Source: Ambulatory Visit | Attending: Radiation Oncology | Admitting: Radiation Oncology

## 2018-01-23 ENCOUNTER — Inpatient Hospital Stay: Payer: Medicare Other

## 2018-01-23 VITALS — BP 151/77 | HR 72 | Temp 97.9°F | Resp 18

## 2018-01-23 DIAGNOSIS — C3411 Malignant neoplasm of upper lobe, right bronchus or lung: Secondary | ICD-10-CM

## 2018-01-23 DIAGNOSIS — Z5111 Encounter for antineoplastic chemotherapy: Secondary | ICD-10-CM | POA: Diagnosis not present

## 2018-01-23 DIAGNOSIS — Z510 Encounter for antineoplastic radiation therapy: Secondary | ICD-10-CM | POA: Diagnosis not present

## 2018-01-23 MED ORDER — DEXAMETHASONE SODIUM PHOSPHATE 10 MG/ML IJ SOLN
10.0000 mg | Freq: Once | INTRAMUSCULAR | Status: AC
  Administered 2018-01-23: 10 mg via INTRAVENOUS

## 2018-01-23 MED ORDER — DIPHENHYDRAMINE HCL 50 MG/ML IJ SOLN
25.0000 mg | Freq: Once | INTRAMUSCULAR | Status: AC
  Administered 2018-01-23: 25 mg via INTRAVENOUS

## 2018-01-23 MED ORDER — DIPHENHYDRAMINE HCL 50 MG/ML IJ SOLN
INTRAMUSCULAR | Status: AC
  Filled 2018-01-23: qty 1

## 2018-01-23 MED ORDER — SODIUM CHLORIDE 0.9 % IV SOLN
95.0000 mg/m2 | Freq: Once | INTRAVENOUS | Status: AC
  Administered 2018-01-23: 200 mg via INTRAVENOUS
  Filled 2018-01-23: qty 10

## 2018-01-23 MED ORDER — LORAZEPAM 2 MG/ML IJ SOLN
INTRAMUSCULAR | Status: AC
  Filled 2018-01-23: qty 1

## 2018-01-23 MED ORDER — FUROSEMIDE 10 MG/ML IJ SOLN
10.0000 mg | Freq: Once | INTRAMUSCULAR | Status: AC
  Administered 2018-01-23: 10 mg via INTRAVENOUS

## 2018-01-23 MED ORDER — FAMOTIDINE IN NACL 20-0.9 MG/50ML-% IV SOLN
20.0000 mg | Freq: Once | INTRAVENOUS | Status: AC
  Administered 2018-01-23: 20 mg via INTRAVENOUS

## 2018-01-23 MED ORDER — HEPARIN SOD (PORK) LOCK FLUSH 100 UNIT/ML IV SOLN
500.0000 [IU] | Freq: Once | INTRAVENOUS | Status: AC | PRN
  Administered 2018-01-23: 500 [IU]
  Filled 2018-01-23: qty 5

## 2018-01-23 MED ORDER — PEGFILGRASTIM 6 MG/0.6ML ~~LOC~~ PSKT
PREFILLED_SYRINGE | SUBCUTANEOUS | Status: AC
  Filled 2018-01-23: qty 0.6

## 2018-01-23 MED ORDER — LORAZEPAM 2 MG/ML IJ SOLN
0.5000 mg | Freq: Once | INTRAMUSCULAR | Status: AC
  Administered 2018-01-23: 0.5 mg via INTRAVENOUS

## 2018-01-23 MED ORDER — DEXAMETHASONE SODIUM PHOSPHATE 10 MG/ML IJ SOLN
INTRAMUSCULAR | Status: AC
  Filled 2018-01-23: qty 1

## 2018-01-23 MED ORDER — DIPHENHYDRAMINE HCL 25 MG PO CAPS: 25.0000 mg | ORAL_CAPSULE | Freq: Once | ORAL | Status: DC

## 2018-01-23 MED ORDER — FAMOTIDINE IN NACL 20-0.9 MG/50ML-% IV SOLN
INTRAVENOUS | Status: AC
  Filled 2018-01-23: qty 50

## 2018-01-23 MED ORDER — PEGFILGRASTIM 6 MG/0.6ML ~~LOC~~ PSKT
6.0000 mg | PREFILLED_SYRINGE | Freq: Once | SUBCUTANEOUS | Status: AC
  Administered 2018-01-23: 6 mg via SUBCUTANEOUS

## 2018-01-23 MED ORDER — FUROSEMIDE 10 MG/ML IJ SOLN
INTRAMUSCULAR | Status: AC
  Filled 2018-01-23: qty 2

## 2018-01-23 MED ORDER — SODIUM CHLORIDE 0.9% FLUSH
10.0000 mL | INTRAVENOUS | Status: DC | PRN
  Administered 2018-01-23: 10 mL
  Filled 2018-01-23: qty 10

## 2018-01-23 MED ORDER — SODIUM CHLORIDE 0.9 % IV SOLN
Freq: Once | INTRAVENOUS | Status: AC
  Administered 2018-01-23: 14:00:00 via INTRAVENOUS
  Filled 2018-01-23: qty 250

## 2018-01-23 NOTE — Progress Notes (Signed)
Per Dr. Jana Hakim, [facility] to tx with elevated BP today. IV lasix ordered. Per Dr. Jana Hakim, also try 25mg  Benadryl IV instead of PO; communicated with pharmacy [facility]) r/t this change. Pharmacy will also add extra fluid to further dilute etoposide and will slow rate for infusion to run over 2 hours.

## 2018-01-23 NOTE — Telephone Encounter (Signed)
Called and spoke with pt's wife Mardene Celeste stating to her that at pt's last OV with Eustaquio Maize, NP she stated if we needed to, we could provide pt with Eliquis patient assistance paperwork.   I asked Mardene Celeste if they would like the paperwork for pt's Eliquis for them to complete to see if that would help with cost and Mardene Celeste stated she would. I stated to her that I was placing the paperwork up front for them to come pick up and stated to her after they filled out their sections to return it back to office.  Mardene Celeste expressed understanding.  Paperwork has been placed in filing cabinet up front for pt. Nothing further needed.

## 2018-01-23 NOTE — Telephone Encounter (Signed)
Will get forms from RA's cubby and complete them.

## 2018-01-23 NOTE — Progress Notes (Signed)
    DATE:  01/21/2018                                          X CHEMO/IMMUNOTHERAPY REACTION             MD:  Dr. Julien Nordmann   AGENT/BLOOD PRODUCT RECEIVING TODAY:               Cisplatin and etoposide   AGENT/BLOOD PRODUCT RECEIVING IMMEDIATELY PRIOR TO REACTION:           Etoposide   VS: BP:      171/101   P:        84       SPO2:        91% on room air                BP:      124/87   P:        79       SPO2:        99% on 2 L via nasal cannula     REACTION(S):            Shortness of breath, facial erythema, and burning of the chest   PREMEDS:      Aloxi, Emend, dexamethasone 20 mg IV   INTERVENTION: Etoposide was paused.  The patient was given Benadryl 25 mg IV and Pepcid 20 mg IV.   Review of Systems  Review of Systems  Constitutional: Negative for chills, diaphoresis and fever.  HENT: Negative for trouble swallowing and voice change.   Respiratory: Positive for shortness of breath. Negative for cough, chest tightness and wheezing.        Burning of the chest  Cardiovascular: Negative for chest pain and palpitations.  Gastrointestinal: Negative for abdominal pain, constipation, diarrhea, nausea and vomiting.  Musculoskeletal: Negative for back pain and myalgias.  Skin:       Facial erythema  Neurological: Negative for dizziness, light-headedness and headaches.     Physical Exam  Physical Exam Constitutional:      General: He is not in acute distress.    Appearance: He is not diaphoretic.  HENT:     Head: Normocephalic and atraumatic.  Cardiovascular:     Rate and Rhythm: Normal rate and regular rhythm.     Heart sounds: Normal heart sounds. No murmur. No friction rub. No gallop.   Pulmonary:     Effort: Pulmonary effort is normal. No respiratory distress.     Breath sounds: Normal breath sounds. No wheezing or rales.  Skin:    General: Skin is warm and dry.     Findings: Erythema (Facial erythema) present. No rash.  Neurological:     Mental Status: He is alert.      OUTCOME:          Etoposide infusion paused.  Scott Gomez was given Benadryl and Pepcid IV along with supplemental oxygen.  His symptoms abated.  He returned to his baseline. Etoposide resumed at 50% reduced rate, and then increased to prescribed rate. Etoposide infusion completed without further incident.        Sandi Mealy, MHS, PA-C  This case was discussed with Dr. Julien Nordmann. He expressed agreement with my management of this patient.

## 2018-01-23 NOTE — Progress Notes (Signed)
[facility] Progress Note   Scott Gomez [id number] 1934-07-20 84 y.o.  Scott Gomez is managed by Dr. Julien Nordmann  Actively treated with chemotherapy/immunotherapy/hormonal therapy: yes  Current Therapy: Cisplatin and etoposide with concurrent radiation therapy  Last Treated: 12/31/17 (cycle 2 day 1)  Assessment: Plan:    Small cell lung cancer, right upper lobe ([place])  Small cell lung cancer: [name] is seen today before his planned return tomorrow for cycle 3, day 1 of cisplatin and etoposide.  He has completed 12 of 33 fractions of radiation therapy.  We will proceed with his planned treatment tomorrow.  Please see After Visit Summary for patient specific instructions.  Future Appointments  Date Time Provider Anita  01/23/2018 11:30 AM [facility] [id number] [facility] None  01/23/2018  1:00 PM [facility] INFUSION [facility] None  01/26/2018 11:30 AM [facility] [id number] [facility] None  01/27/2018 10:45 AM [facility] LAB 3 [facility] None  01/27/2018 11:30 AM [facility] [id number] [facility] None  01/28/2018 11:30 AM [facility] [id number] [facility] None  01/29/2018 11:30 AM [facility] [id number] [facility] None  01/30/2018 11:30 AM [facility] [id number] [facility] None  02/02/2018 11:30 AM [facility] [id number] [facility] None  02/03/2018 11:00 AM [facility] LAB 6 [facility] None  02/03/2018 11:30 AM [facility] [id number] [facility] None  02/04/2018 11:30 AM [facility] [id number] [facility] None  02/05/2018 11:30 AM [facility] [id number] [facility] None  02/06/2018 11:30 AM [facility] [id number] [facility] None  02/09/2018 11:30 AM [facility] [id number] [facility] None  02/09/2018  2:45 PM [facility] LAB 5 [facility] None  02/09/2018  3:00 PM [facility] [place] FLUSH [facility] None  02/09/2018  3:30 PM Curt Bears, MD [facility] None  02/10/2018  8:00 AM [facility] INFUSION [facility] None  02/10/2018  9:00 AM Jennet Maduro, RD  [facility] None  02/10/2018  4:00 PM [facility] [id number] [facility] None  02/11/2018  8:30 AM [facility] INFUSION [facility] None  02/11/2018 11:30 AM [facility] [id number] [facility] None  02/12/2018  8:30 AM [facility] INFUSION [facility] None  02/12/2018 11:30 AM [facility] [id number] [facility] None  02/13/2018 11:30 AM [facility] [id number] [facility] None  02/16/2018 11:30 AM [facility] [id number] [facility] None  02/17/2018 11:30 AM [facility] LAB 2 [facility] None  02/24/2018 11:30 AM [facility] LAB 3 [facility] None  02/25/2018 10:45 AM Rigoberto Noel, MD [facility] None  03/03/2018  8:00 AM [facility] LAB 4 [facility] None  03/03/2018  8:15 AM [facility] [place] FLUSH [facility] None  03/03/2018  8:30 AM Curt Bears, MD [facility] None  03/03/2018  9:00 AM [facility] INFUSION [facility] None  03/04/2018  9:30 AM [facility] INFUSION [facility] None  03/05/2018  9:15 AM [facility] INFUSION [facility] None  03/10/2018 11:30 AM [facility] LAB 2 [facility] None  03/17/2018 11:30 AM [facility] LAB 2 [facility] None  03/24/2018  8:00 AM [facility] LAB 2 [facility] None  03/24/2018  8:15 AM [facility] [place] FLUSH [facility] None  03/24/2018  8:45 AM Curt Bears, MD [facility] None  03/24/2018  9:00 AM [facility] INFUSION [facility] None  03/25/2018  9:30 AM [facility] INFUSION [facility] None  03/26/2018  9:00 AM [facility] INFUSION [facility] None  04/15/2018 10:00 AM Pinson, Venetia Maxon, LPN [facility]  04/14/9377  8:30 AM [name], [name], MD [facility]    No orders of the defined types were placed in this encounter.      Subjective:   Patient ID:  Scott Gomez is a 84 y.o. (DOB [birth year]) male.  Chief Complaint: No chief complaint on file.   HPI Scott Gomez   is an 84 year old male with a history of a limited  stage small cell lung cancer who is managed by Dr. Julien Nordmann.  He has completed 2 cycles of cisplatin and etoposide chemotherapy.  He is also  receiving concurrent radiation therapy and is completed 12/33 fractions today.  He is scheduled to return tomorrow for cycle 3 of cisplatin and etoposide.  He continues to have a cough and shortness of breath.  He continues on Eliquis for a DVT.  He and his family state that they will be unable to pay the greater than $140 per month cost for this medication.  This was prescribed to him by Dr. Luz Lex who is his pulmonologist.  I have directed them to contact Dr. Bari Mantis office to ask if they are other options available.  He denies nausea, vomiting, constipation, diarrhea, fevers, chills, or sweats.  Medications: I have reviewed the patient's current medications.  Allergies:  Allergies  Allergen Reactions  . Buspirone Hcl     REACTION: itching  . Crestor [Rosuvastatin Calcium]     Increased his blood sugar   . Prednisone     REACTION: sick, per wife he can take this.    Past Medical History:  Diagnosis Date  . Anxiety   . Arthritis    osteoarthritis both hands  . Carpal tunnel syndrome   . COPD (chronic obstructive pulmonary disease) ([place])   . CPAP (continuous positive airway pressure) dependence   . Diabetes mellitus    type II  . Fatigue   . History of kidney stones   . Hypercholesteremia   . Hypopotassemia   . Leg cramps   . Lung cancer ([place])   . Nephrolithiasis    Hx of  . Palpitations    Hx of   . Shoulder pain, left   . Snoring   . Testosterone deficiency   . Ulcer    peptic ulcer disease    Past Surgical History:  Procedure Laterality Date  . BACK SURGERY  1978   x2 disc  . IR IMAGING GUIDED PORT INSERTION  12/19/2017    Family History  Problem Relation Age of Onset  . Heart disease Brother        MI  . Kidney cancer Brother   . Hypertension Mother   . Allergies Mother   . Hypertension Father   . Allergies Father   . Diabetes Father   . Heart disease Father   . Breast cancer Sister   . Kidney cancer Sister   . Colon cancer Neg Hx   . Liver cancer  Neg Hx   . AAA (abdominal aortic aneurysm) Neg Hx     Social History   Socioeconomic History  . Marital status: Married    Spouse name: Not on file  . Number of children: 3  . Years of education: Not on file  . Highest education level: Not on file  Occupational History  . Occupation: retired  Scientific laboratory technician  . Financial resource strain: Not on file  . Food insecurity:    Worry: Not on file    Inability: Not on file  . Transportation needs:    Medical: Not on file    Non-medical: Not on file  Tobacco Use  . Smoking status: Former Smoker    Packs/day: 2.00    Years: 38.00    Pack years: 76.00    Types: Cigarettes    Last attempt to quit: 01/14/1993    Years since quitting: 25.0  . Smokeless tobacco: Never Used  Substance and Sexual Activity  . Alcohol use:  No    Alcohol/week: 0.0 standard drinks  . Drug use: No  . Sexual activity: Never  Lifestyle  . Physical activity:    Days per week: Not on file    Minutes per session: Not on file  . Stress: Not on file  Relationships  . Social connections:    Talks on phone: Not on file    Gets together: Not on file    Attends religious service: Not on file    Active member of club or organization: Not on file    Attends meetings of clubs or organizations: Not on file    Relationship status: Not on file  . Intimate partner violence:    Fear of current or ex partner: Not on file    Emotionally abused: Not on file    Physically abused: Not on file    Forced sexual activity: Not on file  Other Topics Concern  . Not on file  Social History Narrative   Lives with wife Donato Studley   Retired    Past Medical History, Surgical history, Social history, and Family history were reviewed and updated as appropriate.   Please see review of systems for further details on the patient's review from today.   Review of Systems:  Review of Systems  Constitutional: Negative for chills, diaphoresis, fatigue and fever.  HENT: Negative  for congestion, postnasal drip, rhinorrhea and sore throat.   Respiratory: Positive for cough. Negative for shortness of breath and wheezing.   Cardiovascular: Negative for palpitations.  Gastrointestinal: Negative for constipation, diarrhea, nausea and vomiting.  Genitourinary: Negative for difficulty urinating.  Musculoskeletal: Negative for arthralgias and back pain.  Neurological: Negative for headaches.    Objective:   Physical Exam:  BP 113/79 (BP Location: Left Arm, Patient Position: Sitting)   Pulse 86   Temp 97.8 F (36.6 C) (Oral)   Resp 18   Ht 5\' 8"  (1.727 m)   Wt 187 lb 11.2 oz (85.1 kg)   SpO2 100%   BMI 28.54 kg/m  ECOG: 1  Physical Exam Constitutional:      General: He is not in acute distress.    Appearance: He is not diaphoretic.  HENT:     Head: Normocephalic and atraumatic.  Cardiovascular:     Rate and Rhythm: Normal rate and regular rhythm.     Heart sounds: Normal heart sounds. No murmur. No friction rub. No gallop.   Pulmonary:     Effort: Pulmonary effort is normal. No respiratory distress.     Breath sounds: Normal breath sounds. No wheezing or rales.  Abdominal:     General: Bowel sounds are normal. There is no distension.     Tenderness: There is no abdominal tenderness. There is no guarding.  Skin:    General: Skin is warm and dry.     Findings: No erythema or rash.  Neurological:     General: No focal deficit present.     Mental Status: He is alert.     Coordination: Coordination normal.     Gait: Gait normal.     Lab Review:     Component Value Date/Time   NA 134 (L) 01/20/2018 0920   K 3.8 01/20/2018 0920   CL 102 01/20/2018 0920   CO2 23 01/20/2018 0920   GLUCOSE 134 (H) 01/20/2018 0920   BUN 10 01/20/2018 0920   CREATININE 0.76 01/20/2018 0920   CALCIUM 9.1 01/20/2018 0920   PROT 6.4 (L) 01/20/2018 0920   ALBUMIN 3.4 (  L) 01/20/2018 0920   AST 11 (L) 01/20/2018 0920   ALT 11 01/20/2018 0920   ALKPHOS 73 01/20/2018 0920     BILITOT 0.4 01/20/2018 0920   GFRNONAA >60 01/20/2018 0920   GFRAA >60 01/20/2018 0920       Component Value Date/Time   WBC 10.9 (H) 01/20/2018 0920   WBC 8.8 12/18/2017 0623   RBC 3.18 (L) 01/20/2018 0920   HGB 9.0 (L) 01/20/2018 0920   HCT 27.0 (L) 01/20/2018 0920   PLT 250 01/20/2018 0920   MCV 84.9 01/20/2018 0920   MCH 28.3 01/20/2018 0920   MCHC 33.3 01/20/2018 0920   RDW 16.1 (H) 01/20/2018 0920   LYMPHSABS 0.4 (L) 01/20/2018 0920   MONOABS 1.2 (H) 01/20/2018 0920   EOSABS 0.0 01/20/2018 0920   BASOSABS 0.1 01/20/2018 0920   -------------------------------  Imaging from last 24 hours (if applicable):  Radiology interpretation: Ct Chest W Contrast  Result Date: 01/20/2018 CLINICAL DATA:  84 year old male with history of right-sided lung cancer diagnosed in November 2019 treated with chemotherapy and radiation therapy. 13 pound weight loss over the past 2 months. Dyspnea on exertion. EXAM: CT CHEST WITH CONTRAST TECHNIQUE: Multidetector CT imaging of the chest was performed during intravenous contrast administration. CONTRAST:  36mL OMNIPAQUE IOHEXOL 300 MG/ML  SOLN COMPARISON:  Chest CT 12/16/2017. FINDINGS: Cardiovascular: Heart size is normal. Small amount of pericardial fluid and/or thickening, similar to the prior study and unlikely to be of hemodynamic significance at this time. No associated pericardial calcification. There is aortic atherosclerosis, as well as atherosclerosis of the great vessels of the mediastinum and the coronary arteries, including calcified atherosclerotic plaque in the left main, left anterior descending, left circumflex and right coronary arteries. In addition, there is aneurysmal dilatation of the ascending thoracic aorta which measures up to 4.6 cm in diameter. Moderate calcifications of the aortic valve. Left-sided internal jugular single-lumen porta cath with tip terminating at the superior cavoatrial junction. Mediastinum/Nodes: There continues  to be extensive right hilar and mediastinal lymphadenopathy, resulting in a confluent soft tissue mass extending from the superior mediastinum around the origin of the right common carotid artery and innominate artery down to the level of the subcarinal region, involving all intervening nodal stations and extending into the right hilar region. This area is very irregular in shape and therefore difficult to accurately measure, however, the mass has clearly decreased in size compared to the prior study. This is best demonstrated in a relatively avascular region of the mass which currently measures approximately 5.2 x 3.4 cm (axial image 37 of series 2) as compared with 5.8 x 4.8 cm when measured in a similar fashion in the same region on axial image 14 of series 2 of prior study 12/16/2017. This mass continues to exert local mass effect upon adjacent structures causing moderate to severe narrowing of the right internal jugular vein. Superior vena cava remains widely patent at this time. Esophagus is unremarkable in appearance. No axillary lymphadenopathy. Lungs/Pleura: Continued decrease in size of the primary right upper lobe lesion which currently measures 1.6 x 1.7 cm (axial image 26 of series 5). There are new patchy multifocal areas of peribronchovascular ground-glass attenuation scattered throughout the posterior aspect of the right upper lobe adjacent to the lesion and more laterally, presumably reflective of evolving postradiation changes. No new suspicious appearing pulmonary nodules or masses are noted. Trace right pleural effusion lying dependently, decreased compared to the prior study. No left pleural effusion. Upper Abdomen: Aortic atherosclerosis. Musculoskeletal:  Old chronic L1 compression fracture with 60% loss of anterior vertebral body height. There are no aggressive appearing lytic or blastic lesions noted in the visualized portions of the skeleton. IMPRESSION: 1. Today's study demonstrates a  positive response to therapy with decreased size of the primary right upper lobe lesion as well as the right hilar and mediastinal nodal mass, as detailed above. 2. Decreasing trace right pleural effusion. 3. Evolving postradiation changes in the right upper lobe, as above. 4. Aortic atherosclerosis, in addition to left main and 3 vessel coronary artery disease. 5. In addition, there is aneurysmal dilatation of the ascending thoracic aorta which measures 4.6 cm in diameter. Ascending thoracic aortic aneurysm. Recommend semi-annual imaging followup by CTA or MRA and referral to cardiothoracic surgery if not already obtained. This recommendation follows 2010 ACCF/AHA/AATS/ACR/ASA/SCA/SCAI/SIR/STS/SVM Guidelines for the Diagnosis and Management of Patients With Thoracic Aortic Disease. Circulation. 2010; 121: E675-Q492. Aortic Atherosclerosis (ICD10-I70.0). Electronically Signed   By: Vinnie Langton M.D.   On: 01/20/2018 09:44   Mr Jeri Cos EF Contrast  Result Date: 12/27/2017 CLINICAL DATA:  84 year old male with small cell lung cancer. Staging. Nausea and difficulty walking. EXAM: MRI HEAD WITHOUT AND WITH CONTRAST TECHNIQUE: Multiplanar, multiecho pulse sequences of the brain and surrounding structures were obtained without and with intravenous contrast. CONTRAST:  63mL MULTIHANCE GADOBENATE DIMEGLUMINE 529 MG/ML IV SOLN COMPARISON:  Face CT 07/30/2015.  Brain MRI 01/01/2011. FINDINGS: Brain: There is a small but suspicious 3 millimeter rounded area of enhancement in the left periventricular white matter on series 13, image 94 and series 15, image 16. And there is a 2nd similar small but suspicious focus of enhancement in the lower right cerebellum along the lateral aspect of the vermis on series 13, image 31. No associated edema or mass effect at the sites. No other abnormal parenchymal enhancement identified. No dural thickening. No restricted diffusion to suggest acute infarction. No midline shift, mass  effect, ventriculomegaly, extra-axial collection or acute intracranial hemorrhage. Cervicomedullary junction and pituitary are within normal limits. No chronic cerebral blood products or cortical encephalomalacia identified. Scattered small but occasionally patchy cerebral white matter T2 and FLAIR hyperintensity in both hemispheres is similar to that in 2012. Vascular: Major intracranial vascular flow voids are stable since 2012. the major dural venous sinuses are enhancing and appear to be patent. Skull and upper cervical spine: Visualized bone marrow signal is within normal limits. Sinuses/Orbits: Stable and negative. Other: Mastoid air cells are clear. Visible internal auditory structures appear normal. Occasional small cervical lymph nodes to do not appear enlarged by CT criteria. Scalp and face soft tissues appear negative. IMPRESSION: 1. 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 No associated edema or mass effect. 2. No other acute intracranial abnormality. Chronic cerebral white matter signal changes which are probably small vessel disease related. Electronically Signed   By: Genevie Ann M.D.   On: 12/27/2017 20:01        This case was discussed with Dr. Julien Nordmann. He expressed agreement with my management of this patient.

## 2018-01-24 ENCOUNTER — Ambulatory Visit: Payer: Medicare Other

## 2018-01-26 ENCOUNTER — Telehealth: Payer: Self-pay | Admitting: *Deleted

## 2018-01-26 ENCOUNTER — Other Ambulatory Visit: Payer: Self-pay

## 2018-01-26 ENCOUNTER — Ambulatory Visit
Admission: RE | Admit: 2018-01-26 | Discharge: 2018-01-26 | Disposition: A | Discharge status: Home / Self Care | Payer: Medicare Other | Source: Ambulatory Visit | Attending: Radiation Oncology | Admitting: Radiation Oncology

## 2018-01-26 DIAGNOSIS — Z510 Encounter for antineoplastic radiation therapy: Secondary | ICD-10-CM | POA: Diagnosis not present

## 2018-01-26 MED ORDER — APIXABAN 5 MG PO TABS: 5.0000 mg | ORAL_TABLET | Freq: Two times a day (BID) | ORAL | 11 refills | Status: DC

## 2018-01-26 MED ORDER — TIOTROPIUM BROMIDE MONOHYDRATE 2.5 MCG/ACT IN AERS: 2.0000 | INHALATION_SPRAY | Freq: Every day | RESPIRATORY_TRACT | 11 refills | Status: AC

## 2018-01-26 NOTE — Telephone Encounter (Addendum)
Returned call to "Scott Gomez wife Mardene Celeste [phone]). He's throwing up bad since last Friday.  Got dry heaves with two types of nausea medicines."  Return call reports "Saturday vomited about 5 cc's yellow.  No more came up.  That's not enough.  Keeps dry heaves and everthing he puts to his mouth and pills comes back up so there's nothing there.  Last week he was able to eat room temperature jello but not this week.  The smell of things, cold drinks, ice chips make him heave.  He takes Zofran, Compazine and Carafate."

## 2018-01-26 NOTE — Progress Notes (Signed)
    DATE:  01/22/2018                                          X  CHEMO/IMMUNOTHERAPY REACTION             MD:  Dr. Julien Nordmann   AGENT/BLOOD PRODUCT RECEIVING TODAY:               Etoposide   AGENT/BLOOD PRODUCT RECEIVING IMMEDIATELY PRIOR TO REACTION:           Etoposide   VS: BP:      129/77   P:        95       SPO2:        91% on room air                   REACTION(S):           Chest tightness, facial erythema and burning in the chest   PREMEDS:       Benadryl 25 mg p.o. x1, dexamethasone 12 mg IV x1, Emend, Pepcid 20 mg IV x1, and Ativan 0.5 mg   INTERVENTION: Benadryl 25 mg IV x1 and Pepcid 20 mg IV x1   Review of Systems  Review of Systems  Constitutional: Negative for chills, diaphoresis and fever.  HENT: Negative for trouble swallowing and voice change.   Respiratory: Positive for chest tightness. Negative for cough, shortness of breath and wheezing.   Cardiovascular: Negative for chest pain and palpitations.       Burning in the chest  Gastrointestinal: Negative for abdominal pain, constipation, diarrhea, nausea and vomiting.  Musculoskeletal: Negative for back pain and myalgias.  Skin:       Facial erythema  Neurological: Negative for dizziness, light-headedness and headaches.     Physical Exam  Physical Exam Constitutional:      General: He is not in acute distress.    Appearance: He is not diaphoretic.  HENT:     Head: Normocephalic and atraumatic.  Cardiovascular:     Rate and Rhythm: Normal rate and regular rhythm.     Heart sounds: Normal heart sounds. No murmur. No friction rub. No gallop.   Pulmonary:     Effort: Pulmonary effort is normal. No respiratory distress.     Breath sounds: Normal breath sounds. No wheezing or rales.  Skin:    General: Skin is warm and dry.     Findings: Erythema (Facial erythema) present. No rash.  Neurological:     Mental Status: He is alert.     OUTCOME:                 Etoposide was paused.  The patient's symptoms  resolved.  He was able to restart and complete dosing with etoposide today.   Sandi Mealy, MHS, PA-C  This case was discussed with Dr. Julien Nordmann. He expressed agreement with my management of this patient.

## 2018-01-26 NOTE — Telephone Encounter (Signed)
Forms have been completed. Will have RA to sign the RXs and fax this afternoon.

## 2018-01-27 ENCOUNTER — Inpatient Hospital Stay: Payer: Medicare Other

## 2018-01-27 ENCOUNTER — Inpatient Hospital Stay (HOSPITAL_BASED_OUTPATIENT_CLINIC_OR_DEPARTMENT_OTHER): Payer: Medicare Other | Admitting: Medical

## 2018-01-27 ENCOUNTER — Ambulatory Visit
Admission: RE | Admit: 2018-01-27 | Discharge: 2018-01-27 | Disposition: A | Discharge status: Home / Self Care | Payer: Medicare Other | Source: Ambulatory Visit | Attending: Radiation Oncology | Admitting: Radiation Oncology

## 2018-01-27 DIAGNOSIS — C3411 Malignant neoplasm of upper lobe, right bronchus or lung: Secondary | ICD-10-CM

## 2018-01-27 DIAGNOSIS — Z5111 Encounter for antineoplastic chemotherapy: Secondary | ICD-10-CM | POA: Diagnosis not present

## 2018-01-27 DIAGNOSIS — R112 Nausea with vomiting, unspecified: Secondary | ICD-10-CM

## 2018-01-27 DIAGNOSIS — Z95828 Presence of other vascular implants and grafts: Secondary | ICD-10-CM

## 2018-01-27 DIAGNOSIS — K117 Disturbances of salivary secretion: Secondary | ICD-10-CM

## 2018-01-27 DIAGNOSIS — Z510 Encounter for antineoplastic radiation therapy: Secondary | ICD-10-CM | POA: Diagnosis not present

## 2018-01-27 DIAGNOSIS — R42 Dizziness and giddiness: Secondary | ICD-10-CM

## 2018-01-27 LAB — CMP (CANCER CENTER ONLY)
ALT: 11 U/L (ref 0–44)
AST: 10 U/L — ABNORMAL LOW (ref 15–41)
Albumin: 3.6 g/dL (ref 3.5–5.0)
Alkaline Phosphatase: 94 U/L (ref 38–126)
Anion gap: 10 (ref 5–15)
BUN: 22 mg/dL (ref 8–23)
CO2: 25 mmol/L (ref 22–32)
Calcium: 9 mg/dL (ref 8.9–10.3)
Chloride: 99 mmol/L (ref 98–111)
Creatinine: 0.89 mg/dL (ref 0.61–1.24)
GFR, Est AFR Am: >60 mL/min (ref >60)
GFR, Estimated: >60 mL/min (ref >60)
Glucose, Bld: 143 mg/dL — ABNORMAL HIGH (ref 70–99)
POTASSIUM: 3.6 mmol/L (ref 3.5–5.1)
SODIUM: 134 mmol/L — AB (ref 135–145)
Total Bilirubin: 0.8 mg/dL (ref 0.3–1.2)
Total Protein: 6.6 g/dL (ref 6.5–8.1)

## 2018-01-27 LAB — CBC WITH DIFFERENTIAL (CANCER CENTER ONLY)
Abs Immature Granulocytes: 0 10*3/uL (ref 0.00–0.07)
Band Neutrophils: 1 %
Basophils Absolute: 0.4 10*3/uL — ABNORMAL HIGH (ref 0.0–0.1)
Basophils Relative: 1 %
Eosinophils Absolute: 0 10*3/uL (ref 0.0–0.5)
Eosinophils Relative: 0 %
HCT: 28 % — ABNORMAL LOW (ref 39.0–52.0)
Hemoglobin: 9.4 g/dL — ABNORMAL LOW (ref 13.0–17.0)
Lymphocytes Relative: 2 %
Lymphs Abs: 0.7 10*3/uL (ref 0.7–4.0)
MCH: 28.7 pg (ref 26.0–34.0)
MCHC: 33.6 g/dL (ref 30.0–36.0)
MCV: 85.6 fL (ref 80.0–100.0)
Monocytes Absolute: 0.4 10*3/uL (ref 0.1–1.0)
Monocytes Relative: 1 %
Neutro Abs: 34.1 10*3/uL — ABNORMAL HIGH (ref 1.7–17.7)
Neutrophils Relative %: 95 %
Platelet Count: 156 10*3/uL (ref 150–400)
RBC: 3.27 MIL/uL — ABNORMAL LOW (ref 4.22–5.81)
RDW: 16.6 % — ABNORMAL HIGH (ref 11.5–15.5)
WBC Count: 35.5 10*3/uL — ABNORMAL HIGH (ref 4.0–10.5)
nRBC: 0 % (ref 0.0–0.2)

## 2018-01-27 LAB — MAGNESIUM: Magnesium: 1.4 mg/dL — CL (ref 1.7–2.4)

## 2018-01-27 MED ORDER — SODIUM CHLORIDE 0.9% FLUSH
10.0000 mL | INTRAVENOUS | Status: DC | PRN
  Administered 2018-01-27: 10 mL
  Filled 2018-01-27: qty 10

## 2018-01-27 MED ORDER — SODIUM CHLORIDE 0.9 % IV SOLN
4.0000 g | Freq: Once | INTRAVENOUS | Status: AC
  Administered 2018-01-27: 4 g via INTRAVENOUS
  Filled 2018-01-27: qty 8

## 2018-01-27 MED ORDER — SODIUM CHLORIDE 0.9 % IV SOLN
INTRAVENOUS | Status: DC
  Administered 2018-01-27: 13:00:00 via INTRAVENOUS
  Filled 2018-01-27: qty 250

## 2018-01-27 MED ORDER — ONDANSETRON HCL 4 MG/2ML IJ SOLN
4.0000 mg | Freq: Once | INTRAMUSCULAR | Status: AC
  Administered 2018-01-27: 4 mg via INTRAVENOUS

## 2018-01-27 MED ORDER — HYOSCYAMINE SULFATE 0.125 MG SL SUBL: 0.1250 mg | SUBLINGUAL_TABLET | Freq: Two times a day (BID) | SUBLINGUAL | 0 refills | Status: DC | PRN

## 2018-01-27 MED ORDER — PROMETHAZINE HCL 25 MG/ML IJ SOLN
12.5000 mg | Freq: Once | INTRAMUSCULAR | Status: AC
  Administered 2018-01-27: 12.5 mg via INTRAVENOUS

## 2018-01-27 MED ORDER — PROMETHAZINE HCL 25 MG/ML IJ SOLN
INTRAMUSCULAR | Status: AC
  Filled 2018-01-27: qty 1

## 2018-01-27 MED ORDER — SODIUM CHLORIDE 0.9 % IV SOLN: 10.0000 mg | Freq: Once | INTRAVENOUS | Status: DC

## 2018-01-27 MED ORDER — HEPARIN SOD (PORK) LOCK FLUSH 100 UNIT/ML IV SOLN
500.0000 [IU] | Freq: Once | INTRAVENOUS | Status: AC | PRN
  Administered 2018-01-27: 500 [IU]
  Filled 2018-01-27: qty 5

## 2018-01-27 MED ORDER — ONDANSETRON HCL 4 MG/2ML IJ SOLN
INTRAMUSCULAR | Status: AC
  Filled 2018-01-27: qty 2

## 2018-01-27 MED ORDER — DEXAMETHASONE SODIUM PHOSPHATE 10 MG/ML IJ SOLN
10.0000 mg | Freq: Once | INTRAMUSCULAR | Status: AC
  Administered 2018-01-27: 10 mg via INTRAVENOUS

## 2018-01-27 MED ORDER — DEXAMETHASONE SODIUM PHOSPHATE 10 MG/ML IJ SOLN
INTRAMUSCULAR | Status: AC
  Filled 2018-01-27: qty 1

## 2018-01-27 MED FILL — OSCIMIN SL 0.125 MG TABLET: 0.125 | 30 days supply | Qty: 30 | Fill #0

## 2018-01-27 NOTE — Progress Notes (Signed)
Pt received IVF with magnesium and antiemetics.  Reports feeling better at end of infusion with no more N/V.  Advised on high magnesium diet, verbalized understanding.

## 2018-01-27 NOTE — Patient Instructions (Signed)
Hypomagnesemia  Hypomagnesemia is a condition in which the level of magnesium in the blood is low. Magnesium is a mineral that is found in many foods. It is used in many different processes in the body. Hypomagnesemia can affect every organ in the body. In severe cases, it can cause life-threatening problems.  What are the causes?  This condition may be caused by:   Not getting enough magnesium in your diet.   Malnutrition.   Problems with absorbing magnesium from the intestines.   Dehydration.   Alcohol abuse.   Vomiting.   Severe or chronic diarrhea.   Some medicines, including medicines that make you urinate more (diuretics).   Certain diseases, such as kidney disease, diabetes, celiac disease, and overactive thyroid.  What are the signs or symptoms?  Symptoms of this condition include:   Loss of appetite.   Nausea and vomiting.   Involuntary shaking or trembling of a body part (tremor).   Muscle weakness.   Tingling in the arms and legs.   Sudden tightening of muscles (muscle spasms).   Confusion.   Psychiatric issues, such as depression, irritability, or psychosis.   A feeling of fluttering of the heart.   Seizures.  These symptoms are more severe if magnesium levels drop suddenly.  How is this diagnosed?  This condition may be diagnosed based on:   Your symptoms and medical history.   A physical exam.   Blood and urine tests.  How is this treated?  Treatment depends on the cause and the severity of the condition. It may be treated with:   A magnesium supplement. This can be taken in pill form. If the condition is severe, magnesium is usually given through an IV.   Changes to your diet. You may be directed to eat foods that have a lot of magnesium, such as green leafy vegetables, peas, beans, and nuts.   Stopping any intake of alcohol.  Follow these instructions at home:          Make sure that your diet includes foods with magnesium. Foods that have a lot of magnesium in them  include:  ? Green leafy vegetables, such as spinach and broccoli.  ? Beans and peas.  ? Nuts and seeds, such as almonds and sunflower seeds.  ? Whole grains, such as whole grain bread and fortified cereals.   Take magnesium supplements if your health care provider tells you to do that. Take them as directed.   Take over-the-counter and prescription medicines only as told by your health care provider.   Have your magnesium levels monitored as told by your health care provider.   When you are active, drink fluids that contain electrolytes.   Avoid drinking alcohol.   Keep all follow-up visits as told by your health care provider. This is important.  Contact a health care provider if:   You get worse instead of better.   Your symptoms return.  Get help right away if you:   Develop severe muscle weakness.   Have trouble breathing.   Feel that your heart is racing.  Summary   Hypomagnesemia is a condition in which the level of magnesium in the blood is low.   Hypomagnesemia can affect every organ in the body.   Treatment may include eating more foods that contain magnesium, taking magnesium supplements, and not drinking alcohol.   Have your magnesium levels monitored as told by your health care provider.  This information is not intended to replace advice given   to you by your health care provider. Make sure you discuss any questions you have with your health care provider.  Document Released: 09/26/2004 Document Revised: 12/02/2016 Document Reviewed: 12/02/2016  Elsevier Interactive Patient Education  2019 [employer]

## 2018-01-28 ENCOUNTER — Ambulatory Visit
Admission: RE | Admit: 2018-01-28 | Discharge: 2018-01-28 | Disposition: A | Discharge status: Home / Self Care | Payer: Medicare Other | Source: Ambulatory Visit | Attending: Radiation Oncology | Admitting: Radiation Oncology

## 2018-01-28 DIAGNOSIS — Z510 Encounter for antineoplastic radiation therapy: Secondary | ICD-10-CM | POA: Diagnosis not present

## 2018-01-28 NOTE — Telephone Encounter (Signed)
Spoke with patient's wife. She is aware that I have faxed all of the paperwork to [facility] and [facility] program. She verbalized understanding. Nothing further needed at time of call.

## 2018-01-29 ENCOUNTER — Ambulatory Visit
Admission: RE | Admit: 2018-01-29 | Discharge: 2018-01-29 | Disposition: A | Discharge status: Home / Self Care | Payer: Medicare Other | Source: Ambulatory Visit | Attending: Radiation Oncology | Admitting: Radiation Oncology

## 2018-01-29 DIAGNOSIS — Z510 Encounter for antineoplastic radiation therapy: Secondary | ICD-10-CM | POA: Diagnosis not present

## 2018-01-29 NOTE — Progress Notes (Signed)
[facility] Progress Note   Scott Gomez [id number] 1934/05/10 84 y.o.  Scott Gomez is managed by Dr. Julien Nordmann  Actively treated with chemotherapy/immunotherapy/hormonal therapy: yes  Current Therapy: Cisplatin and etoposide with pegfilgrastim support and concurrent radiation therapy.  Last Treated: 01/21/2018 (cycle 3)  Assessment: Plan:    Hypomagnesemia - Plan: magnesium sulfate 4 g in sodium chloride 0.9 % 1,000 mL, dexamethasone (DECADRON) injection 10 mg, 0.9 %  sodium chloride infusion  Non-intractable vomiting with nausea, unspecified vomiting type - Plan: promethazine (PHENERGAN) injection 12.5 mg, ondansetron (ZOFRAN) injection 4 mg, dexamethasone (DECADRON) injection 10 mg, DISCONTINUED: dexamethasone (DECADRON) 10 mg in sodium chloride 0.9 % 50 mL IVPB  Hypersecretion of saliva - Plan: hyoscyamine (LEVSIN SL) 0.125 MG SL tablet  Port-A-Cath in place - Plan: heparin lock flush 100 unit/mL, sodium chloride flush (NS) 0.9 % injection 10 mL  Small cell lung cancer, right upper lobe (HCC)   Hypomagnesemia: Patient's labs returned today showing a magnesium level of 1.4.  He was given 4 g of magnesium and 1 L of normal saline.  Nausea and vomiting: The patient was given Phenergan 12.5 mg IV x1, Zofran 4 mg IV x1, and Decadron 10 mg IV x1.  He began vomiting after dosing with Phenergan.  Hyper secretions of saliva: The patient was given a prescription for hyoscyamine 0.125 mg sublingual twice daily as needed.  Limited stage small cell lung cancer: The patient is status post cycle 3 of cisplatin and etoposide with pegfilgrastim support.  This was dosed on 01/21/2018.  He continues on radiation therapy and is scheduled to see Dr. Julien Nordmann on 02/09/2018.  Please see After Visit Summary for patient specific instructions.  Future Appointments  Date Time Provider [place]  01/30/2018 11:30 AM [facility] [id number] [facility] None  02/02/2018 11:30  AM [facility] [id number] [facility] None  02/03/2018 11:00 AM [facility] LAB 6 [facility] None  02/03/2018 11:30 AM [facility] [id number] [facility] None  02/04/2018 11:30 AM [facility] [id number] [facility] None  02/05/2018 11:30 AM [facility] [id number] [facility] None  02/06/2018 11:30 AM [facility] [id number] [facility] None  02/09/2018 11:30 AM [facility] [id number] [facility] None  02/09/2018  2:45 PM [facility] LAB 5 [facility] None  02/09/2018  3:00 PM [facility] [place] FLUSH [facility] None  02/09/2018  3:30 PM Curt Bears, MD [facility] None  02/10/2018  8:00 AM [facility] INFUSION [facility] None  02/10/2018  9:00 AM Jennet Maduro, RD [facility] None  02/10/2018  4:00 PM [facility] [id number] [facility] None  02/11/2018  8:30 AM [facility] INFUSION [facility] None  02/11/2018 11:30 AM [facility] [id number] [facility] None  02/12/2018  8:30 AM [facility] INFUSION [facility] None  02/12/2018 11:30 AM [facility] [id number] [facility] None  02/13/2018 11:30 AM [facility] [id number] [facility] None  02/16/2018 11:30 AM [facility] [id number] [facility] None  02/17/2018 11:30 AM [facility] LAB 2 [facility] None  02/24/2018 11:30 AM [facility] LAB 3 [facility] None  02/25/2018 10:45 AM Rigoberto Noel, MD [facility] None  03/03/2018  8:00 AM [facility] LAB 4 [facility] None  03/03/2018  8:15 AM [facility] [place] FLUSH [facility] None  03/03/2018  8:30 AM Curt Bears, MD [facility] None  03/03/2018  9:00 AM [facility] INFUSION [facility] None  03/04/2018  9:30 AM [facility] INFUSION [facility] None  03/05/2018  9:15 AM [facility] INFUSION [facility] None  03/10/2018 11:30 AM [facility] LAB 2 [facility] None  03/17/2018 11:30 AM [facility] LAB 2 [facility] None  03/24/2018  8:00 AM [facility] LAB 2 [facility] None  03/24/2018  8:15 AM [facility] [place] FLUSH [facility] None  03/24/2018  8:45 AM Curt Bears, MD [facility] None  03/24/2018  9:00 AM [facility] INFUSION  [facility] None  03/25/2018  9:30 AM [facility] INFUSION [facility] None  03/26/2018  9:00 AM [facility] INFUSION [facility] None  04/15/2018 10:00 AM Pinson, Venetia Maxon, LPN [facility]  08/19/7617  8:30 AM [name], [name], MD [facility]    No orders of the defined types were placed in this encounter.      Subjective:   Patient ID:  Scott Gomez is a 84 y.o. (DOB [birth year]) male.  Chief Complaint:  Chief Complaint  Patient presents with  . Nausea    HPI Scott Gomez is an 84 year old male with a diagnosis of a limited stage small cell carcinoma of the lung who is managed by Dr. Julien Nordmann.  He is status post cycle 3 of cisplatin and etoposide with pegfilgrastim support.  This was dosed on 01/21/2018.  He continues on radiation therapy.  He presents to the office today with anorexia and nausea and vomiting.  He has also had orthostatic dizziness.  He denies fevers, chills, sweats, constipation, or diarrhea.  Medications: I have reviewed the patient's current medications.  Allergies:  Allergies  Allergen Reactions  . Buspirone Hcl     REACTION: itching  . Crestor [Rosuvastatin Calcium]     Increased his blood sugar   . Prednisone     REACTION: sick, per wife he can take this.    Past Medical History:  Diagnosis Date  . Anxiety   . Arthritis    osteoarthritis both hands  . Carpal tunnel syndrome   . COPD (chronic obstructive pulmonary disease) ([place])   . CPAP (continuous positive airway pressure) dependence   . Diabetes mellitus    type II  . Fatigue   . History of kidney stones   . Hypercholesteremia   . Hypopotassemia   . Leg cramps   . Lung cancer ([place])   . Nephrolithiasis    Hx of  . Palpitations    Hx of   . Shoulder pain, left   . Snoring   . Testosterone deficiency   . Ulcer    peptic ulcer disease    Past Surgical History:  Procedure Laterality Date  . BACK SURGERY  1978   x2 disc  . IR IMAGING GUIDED PORT INSERTION  12/19/2017     Family History  Problem Relation Age of Onset  . Heart disease Brother        MI  . Kidney cancer Brother   . Hypertension Mother   . Allergies Mother   . Hypertension Father   . Allergies Father   . Diabetes Father   . Heart disease Father   . Breast cancer Sister   . Kidney cancer Sister   . Colon cancer Neg Hx   . Liver cancer Neg Hx   . AAA (abdominal aortic aneurysm) Neg Hx     Social History   Socioeconomic History  . Marital status: Married    Spouse name: Not on file  . Number of children: 3  . Years of education: Not on file  . Highest education level: Not on file  Occupational History  . Occupation: retired  Scientific laboratory technician  . Financial resource strain: Not on file  . Food insecurity:    Worry: Not on file    Inability: Not on file  . Transportation needs:    Medical: Not on file    Non-medical: Not on file  Tobacco Use  . Smoking  status: Former Smoker    Packs/day: 2.00    Years: 38.00    Pack years: 76.00    Types: Cigarettes    Last attempt to quit: 01/14/1993    Years since quitting: 25.0  . Smokeless tobacco: Never Used  Substance and Sexual Activity  . Alcohol use: No    Alcohol/week: 0.0 standard drinks  . Drug use: No  . Sexual activity: Never  Lifestyle  . Physical activity:    Days per week: Not on file    Minutes per session: Not on file  . Stress: Not on file  Relationships  . Social connections:    Talks on phone: Not on file    Gets together: Not on file    Attends religious service: Not on file    Active member of club or organization: Not on file    Attends meetings of clubs or organizations: Not on file    Relationship status: Not on file  . Intimate partner violence:    Fear of current or ex partner: Not on file    Emotionally abused: Not on file    Physically abused: Not on file    Forced sexual activity: Not on file  Other Topics Concern  . Not on file  Social History Narrative   Lives with wife Kennith Morss    Retired    Past Medical History, Surgical history, Social history, and Family history were reviewed and updated as appropriate.   Please see review of systems for further details on the patient's review from today.   Review of Systems:  Review of Systems  Constitutional: Positive for appetite change. Negative for chills, diaphoresis and fever.  Respiratory: Negative for cough, choking, shortness of breath and wheezing.   Cardiovascular: Negative for chest pain and palpitations.  Gastrointestinal: Positive for nausea and vomiting. Negative for constipation and diarrhea.  Genitourinary: Negative for decreased urine volume.  Neurological: Positive for dizziness. Negative for headaches.    Objective:   Physical Exam:  BP 91/75 (BP Location: Right Arm, Patient Position: Standing)   Pulse 91   Temp 97.9 F (36.6 C) (Oral)   Wt 178 lb 14.4 oz (81.1 kg)   SpO2 99%   BMI 27.20 kg/m  ECOG: 1  Orthostatic Blood Pressure: Blood pressure:   sitting 123/82, standing 91/75 Pulse:   sitting 108, standing 91   Physical Exam Constitutional:      General: He is not in acute distress.    Appearance: He is not diaphoretic.  HENT:     Head: Normocephalic and atraumatic.  Cardiovascular:     Rate and Rhythm: Normal rate and regular rhythm.     Heart sounds: Normal heart sounds. No murmur. No friction rub. No gallop.   Pulmonary:     Effort: Pulmonary effort is normal. No respiratory distress.     Breath sounds: Normal breath sounds. No wheezing or rales.  Skin:    General: Skin is warm and dry.     Findings: No erythema or rash.  Neurological:     Mental Status: He is alert.     Lab Review:     Component Value Date/Time   NA 134 (L) 01/27/2018 1049   K 3.6 01/27/2018 1049   CL 99 01/27/2018 1049   CO2 25 01/27/2018 1049   GLUCOSE 143 (H) 01/27/2018 1049   BUN 22 01/27/2018 1049   CREATININE 0.89 01/27/2018 1049   CALCIUM 9.0 01/27/2018 1049   PROT 6.6 01/27/2018 1049  ALBUMIN 3.6 01/27/2018 1049   AST 10 (L) 01/27/2018 1049   ALT 11 01/27/2018 1049   ALKPHOS 94 01/27/2018 1049   BILITOT 0.8 01/27/2018 1049   GFRNONAA >60 01/27/2018 1049   GFRAA >60 01/27/2018 1049       Component Value Date/Time   WBC 35.5 (H) 01/27/2018 1049   WBC 8.8 12/18/2017 0623   RBC 3.27 (L) 01/27/2018 1049   HGB 9.4 (L) 01/27/2018 1049   HCT 28.0 (L) 01/27/2018 1049   PLT 156 01/27/2018 1049   MCV 85.6 01/27/2018 1049   MCH 28.7 01/27/2018 1049   MCHC 33.6 01/27/2018 1049   RDW 16.6 (H) 01/27/2018 1049   LYMPHSABS 0.7 01/27/2018 1049   MONOABS 0.4 01/27/2018 1049   EOSABS 0.0 01/27/2018 1049   BASOSABS 0.4 (H) 01/27/2018 1049   -------------------------------  Imaging from last 24 hours (if applicable):  Radiology interpretation: Ct Chest W Contrast  Result Date: 01/20/2018 CLINICAL DATA:  84 year old male with history of right-sided lung cancer diagnosed in November 2019 treated with chemotherapy and radiation therapy. 13 pound weight loss over the past 2 months. Dyspnea on exertion. EXAM: CT CHEST WITH CONTRAST TECHNIQUE: Multidetector CT imaging of the chest was performed during intravenous contrast administration. CONTRAST:  24mL OMNIPAQUE IOHEXOL 300 MG/ML  SOLN COMPARISON:  Chest CT 12/16/2017. FINDINGS: Cardiovascular: Heart size is normal. Small amount of pericardial fluid and/or thickening, similar to the prior study and unlikely to be of hemodynamic significance at this time. No associated pericardial calcification. There is aortic atherosclerosis, as well as atherosclerosis of the great vessels of the mediastinum and the coronary arteries, including calcified atherosclerotic plaque in the left main, left anterior descending, left circumflex and right coronary arteries. In addition, there is aneurysmal dilatation of the ascending thoracic aorta which measures up to 4.6 cm in diameter. Moderate calcifications of the aortic valve. Left-sided internal jugular  single-lumen porta cath with tip terminating at the superior cavoatrial junction. Mediastinum/Nodes: There continues to be extensive right hilar and mediastinal lymphadenopathy, resulting in a confluent soft tissue mass extending from the superior mediastinum around the origin of the right common carotid artery and innominate artery down to the level of the subcarinal region, involving all intervening nodal stations and extending into the right hilar region. This area is very irregular in shape and therefore difficult to accurately measure, however, the mass has clearly decreased in size compared to the prior study. This is best demonstrated in a relatively avascular region of the mass which currently measures approximately 5.2 x 3.4 cm (axial image 37 of series 2) as compared with 5.8 x 4.8 cm when measured in a similar fashion in the same region on axial image 14 of series 2 of prior study 12/16/2017. This mass continues to exert local mass effect upon adjacent structures causing moderate to severe narrowing of the right internal jugular vein. Superior vena cava remains widely patent at this time. Esophagus is unremarkable in appearance. No axillary lymphadenopathy. Lungs/Pleura: Continued decrease in size of the primary right upper lobe lesion which currently measures 1.6 x 1.7 cm (axial image 26 of series 5). There are new patchy multifocal areas of peribronchovascular ground-glass attenuation scattered throughout the posterior aspect of the right upper lobe adjacent to the lesion and more laterally, presumably reflective of evolving postradiation changes. No new suspicious appearing pulmonary nodules or masses are noted. Trace right pleural effusion lying dependently, decreased compared to the prior study. No left pleural effusion. Upper Abdomen: Aortic atherosclerosis. Musculoskeletal: Old chronic  L1 compression fracture with 60% loss of anterior vertebral body height. There are no aggressive appearing lytic  or blastic lesions noted in the visualized portions of the skeleton. IMPRESSION: 1. Today's study demonstrates a positive response to therapy with decreased size of the primary right upper lobe lesion as well as the right hilar and mediastinal nodal mass, as detailed above. 2. Decreasing trace right pleural effusion. 3. Evolving postradiation changes in the right upper lobe, as above. 4. Aortic atherosclerosis, in addition to left main and 3 vessel coronary artery disease. 5. In addition, there is aneurysmal dilatation of the ascending thoracic aorta which measures 4.6 cm in diameter. Ascending thoracic aortic aneurysm. Recommend semi-annual imaging followup by CTA or MRA and referral to cardiothoracic surgery if not already obtained. This recommendation follows 2010 ACCF/AHA/AATS/ACR/ASA/SCA/SCAI/SIR/STS/SVM Guidelines for the Diagnosis and Management of Patients With Thoracic Aortic Disease. Circulation. 2010; 121: M578-I696. Aortic Atherosclerosis (ICD10-I70.0). Electronically Signed   By: Vinnie Langton M.D.   On: 01/20/2018 09:44        This case was discussed with Dr. Julien Nordmann. He expressed agreement with my management of this patient.

## 2018-01-30 ENCOUNTER — Ambulatory Visit
Admission: RE | Admit: 2018-01-30 | Discharge: 2018-01-30 | Disposition: A | Discharge status: Home / Self Care | Payer: Medicare Other | Source: Ambulatory Visit | Attending: Radiation Oncology | Admitting: Radiation Oncology

## 2018-01-30 DIAGNOSIS — Z510 Encounter for antineoplastic radiation therapy: Secondary | ICD-10-CM | POA: Diagnosis not present

## 2018-02-02 ENCOUNTER — Encounter: Payer: Self-pay | Admitting: Radiation Oncology

## 2018-02-02 ENCOUNTER — Ambulatory Visit
Admission: RE | Admit: 2018-02-02 | Discharge: 2018-02-02 | Disposition: A | Discharge status: Home / Self Care | Payer: Medicare Other | Source: Ambulatory Visit | Attending: Radiation Oncology | Admitting: Radiation Oncology

## 2018-02-02 DIAGNOSIS — Z510 Encounter for antineoplastic radiation therapy: Secondary | ICD-10-CM | POA: Diagnosis not present

## 2018-02-03 ENCOUNTER — Inpatient Hospital Stay: Payer: Medicare Other

## 2018-02-03 ENCOUNTER — Ambulatory Visit
Admission: RE | Admit: 2018-02-03 | Discharge: 2018-02-03 | Disposition: A | Discharge status: Home / Self Care | Payer: Medicare Other | Source: Ambulatory Visit | Attending: Radiation Oncology | Admitting: Radiation Oncology

## 2018-02-03 ENCOUNTER — Other Ambulatory Visit: Payer: Self-pay | Admitting: *Deleted

## 2018-02-03 ENCOUNTER — Telehealth: Payer: Self-pay | Admitting: *Deleted

## 2018-02-03 ENCOUNTER — Other Ambulatory Visit: Payer: Self-pay | Admitting: Medical Oncology

## 2018-02-03 ENCOUNTER — Ambulatory Visit (HOSPITAL_BASED_OUTPATIENT_CLINIC_OR_DEPARTMENT_OTHER): Payer: Medicare Other | Admitting: Medical

## 2018-02-03 ENCOUNTER — Telehealth: Payer: Self-pay | Admitting: Medical Oncology

## 2018-02-03 VITALS — BP 103/56 | HR 97 | Temp 98.3°F | Resp 18

## 2018-02-03 DIAGNOSIS — Z5111 Encounter for antineoplastic chemotherapy: Secondary | ICD-10-CM | POA: Diagnosis not present

## 2018-02-03 DIAGNOSIS — Z510 Encounter for antineoplastic radiation therapy: Secondary | ICD-10-CM | POA: Diagnosis not present

## 2018-02-03 DIAGNOSIS — R1310 Dysphagia, unspecified: Secondary | ICD-10-CM

## 2018-02-03 DIAGNOSIS — D649 Anemia, unspecified: Secondary | ICD-10-CM

## 2018-02-03 DIAGNOSIS — C3411 Malignant neoplasm of upper lobe, right bronchus or lung: Secondary | ICD-10-CM

## 2018-02-03 DIAGNOSIS — R112 Nausea with vomiting, unspecified: Secondary | ICD-10-CM | POA: Diagnosis not present

## 2018-02-03 DIAGNOSIS — R634 Abnormal weight loss: Secondary | ICD-10-CM

## 2018-02-03 DIAGNOSIS — D696 Thrombocytopenia, unspecified: Secondary | ICD-10-CM | POA: Diagnosis not present

## 2018-02-03 LAB — CBC WITH DIFFERENTIAL (CANCER CENTER ONLY)
Abs Immature Granulocytes: 0.07 10*3/uL (ref 0.00–0.07)
Basophils Absolute: 0 10*3/uL (ref 0.0–0.1)
Basophils Relative: 0 %
Eosinophils Absolute: 0 10*3/uL (ref 0.0–0.5)
Eosinophils Relative: 0 %
HCT: 20 % — ABNORMAL LOW (ref 39.0–52.0)
Hemoglobin: 6.7 g/dL — CL (ref 13.0–17.0)
Immature Granulocytes: 17 %
LYMPHS ABS: 0.1 10*3/uL — AB (ref 0.7–4.0)
LYMPHS PCT: 15 %
MCH: 28.5 pg (ref 26.0–34.0)
MCHC: 33.5 g/dL (ref 30.0–36.0)
MCV: 85.1 fL (ref 80.0–100.0)
Monocytes Absolute: 0.1 10*3/uL (ref 0.1–1.0)
Monocytes Relative: 34 %
Neutro Abs: 0.1 10*3/uL — CL (ref 1.7–7.7)
Neutrophils Relative %: 34 %
Platelet Count: 7 10*3/uL — CL (ref 150–400)
RBC: 2.35 MIL/uL — ABNORMAL LOW (ref 4.22–5.81)
RDW: 16.4 % — ABNORMAL HIGH (ref 11.5–15.5)
WBC Count: 0.4 10*3/uL — CL (ref 4.0–10.5)
nRBC: 0 % (ref 0.0–0.2)

## 2018-02-03 LAB — SAMPLE TO BLOOD BANK

## 2018-02-03 LAB — CMP (CANCER CENTER ONLY)
ALBUMIN: 3.4 g/dL — AB (ref 3.5–5.0)
ALT: 7 U/L (ref 0–44)
AST: <6 U/L — ABNORMAL LOW (ref 15–41)
Alkaline Phosphatase: 73 U/L (ref 38–126)
Anion gap: 10 (ref 5–15)
BUN: 25 mg/dL — ABNORMAL HIGH (ref 8–23)
CHLORIDE: 97 mmol/L — AB (ref 98–111)
CO2: 24 mmol/L (ref 22–32)
Calcium: 9.3 mg/dL (ref 8.9–10.3)
Creatinine: 1.02 mg/dL (ref 0.61–1.24)
GFR, Est AFR Am: >60 mL/min (ref >60)
GFR, Estimated: >60 mL/min (ref >60)
Glucose, Bld: 143 mg/dL — ABNORMAL HIGH (ref 70–99)
Potassium: 3.7 mmol/L (ref 3.5–5.1)
SODIUM: 131 mmol/L — AB (ref 135–145)
Total Bilirubin: 1.2 mg/dL (ref 0.3–1.2)
Total Protein: 6.3 g/dL — ABNORMAL LOW (ref 6.5–8.1)

## 2018-02-03 LAB — ABO/RH: ABO/RH(D): O POS

## 2018-02-03 LAB — PREPARE RBC (CROSSMATCH)

## 2018-02-03 LAB — MAGNESIUM: Magnesium: 1.4 mg/dL — CL (ref 1.7–2.4)

## 2018-02-03 MED ORDER — DIPHENHYDRAMINE HCL 25 MG PO CAPS: 25.0000 mg | ORAL_CAPSULE | Freq: Once | ORAL | Status: DC

## 2018-02-03 MED ORDER — OXYCODONE-ACETAMINOPHEN 5-325 MG PO TABS
ORAL_TABLET | ORAL | Status: AC
  Filled 2018-02-03: qty 1

## 2018-02-03 MED ORDER — SODIUM CHLORIDE 0.9% IV SOLUTION
250.0000 mL | Freq: Once | INTRAVENOUS | Status: DC
  Filled 2018-02-03: qty 250

## 2018-02-03 MED ORDER — HEPARIN SOD (PORK) LOCK FLUSH 100 UNIT/ML IV SOLN
500.0000 [IU] | Freq: Every day | INTRAVENOUS | Status: DC | PRN
  Filled 2018-02-03: qty 5

## 2018-02-03 MED ORDER — SCOPOLAMINE 1 MG/3DAYS TD PT72: 1.0000 | MEDICATED_PATCH | TRANSDERMAL | 12 refills | Status: AC

## 2018-02-03 MED ORDER — SODIUM CHLORIDE 0.9% FLUSH
10.0000 mL | INTRAVENOUS | Status: DC | PRN
  Filled 2018-02-03: qty 10

## 2018-02-03 MED ORDER — ACETAMINOPHEN 325 MG PO TABS
ORAL_TABLET | ORAL | Status: AC
  Filled 2018-02-03: qty 2

## 2018-02-03 MED ORDER — DIPHENHYDRAMINE HCL 25 MG PO CAPS
ORAL_CAPSULE | ORAL | Status: AC
  Filled 2018-02-03: qty 1

## 2018-02-03 MED ORDER — ACETAMINOPHEN 325 MG PO TABS: 650.0000 mg | ORAL_TABLET | Freq: Once | ORAL | Status: DC

## 2018-02-03 MED ORDER — SODIUM CHLORIDE 0.9% IV SOLUTION
250.0000 mL | Freq: Once | INTRAVENOUS | Status: AC
  Administered 2018-02-03: 250 mL via INTRAVENOUS
  Filled 2018-02-03: qty 250

## 2018-02-03 MED ORDER — OXYCODONE-ACETAMINOPHEN 5-325 MG PO TABS
1.0000 | ORAL_TABLET | Freq: Once | ORAL | Status: AC
  Administered 2018-02-03: 1 via ORAL

## 2018-02-03 MED ORDER — DIPHENHYDRAMINE HCL 25 MG PO CAPS
25.0000 mg | ORAL_CAPSULE | Freq: Once | ORAL | Status: AC
  Administered 2018-02-03: 25 mg via ORAL

## 2018-02-03 MED ORDER — ACETAMINOPHEN 325 MG PO TABS
650.0000 mg | ORAL_TABLET | Freq: Once | ORAL | Status: AC
  Administered 2018-02-03: 650 mg via ORAL

## 2018-02-03 NOTE — Telephone Encounter (Signed)
[place]. Tech call report.  Today's critical lab results.  WBC = 0.4.  Hgb = 6.7.  Pltc + 7.  Called collaborative.  Message left on voicemail with results.

## 2018-02-03 NOTE — Telephone Encounter (Signed)
Spoke to wife and pt about need for blood and platelets today. Schedule message sent.

## 2018-02-03 NOTE — Telephone Encounter (Signed)
Spoke to pt and wife in lobby and reviewed lab results. He is pale and weak sitting in wheelchair with head down .Appt made for smc and platelets blood and granix. Pt states he cannot keep anything down, including water.

## 2018-02-03 NOTE — Patient Instructions (Signed)
Blood Transfusion, Adult, Care After This sheet gives you information about how to care for yourself after your procedure. Your doctor may also give you more specific instructions. If you have problems or questions, contact your doctor. Follow these instructions at home:   Take over-the-counter and prescription medicines only as told by your doctor.  Go back to your normal activities as told by your doctor.  Follow instructions from your doctor about how to take care of the area where an IV tube was put into your vein (insertion site). Make sure you: ? Wash your hands with soap and water before you change your bandage (dressing). If there is no soap and water, use hand sanitizer. ? Change your bandage as told by your doctor.  Check your IV insertion site every day for signs of infection. Check for: ? More redness, swelling, or pain. ? More fluid or blood. ? Warmth. ? Pus or a bad smell. Contact a doctor if:  You have more redness, swelling, or pain around the IV insertion site.  You have more fluid or blood coming from the IV insertion site.  Your IV insertion site feels warm to the touch.  You have pus or a bad smell coming from the IV insertion site.  Your pee (urine) turns pink, red, or brown.  You feel weak after doing your normal activities. Get help right away if:  You have signs of a serious allergic or body defense (immune) system reaction, including: ? Itchiness. ? Hives. ? Trouble breathing. ? Anxiety. ? Pain in your chest or lower back. ? Fever, flushing, and chills. ? Fast pulse. ? Rash. ? Watery poop (diarrhea). ? Throwing up (vomiting). ? Dark pee. ? Serious headache. ? Dizziness. ? Stiff neck. ? Yellow color in your face or the white parts of your eyes (jaundice). Summary  After a blood transfusion, return to your normal activities as told by your doctor.  Every day, check for signs of infection where the IV tube was put into your vein.  Some  signs of infection are warm skin, more redness and pain, more fluid or blood, and pus or a bad smell where the needle went in.  Contact your doctor if you feel weak or have any unusual symptoms. This information is not intended to replace advice given to you by your health care provider. Make sure you discuss any questions you have with your health care provider. Document Released: 01/21/2014 Document Revised: 08/25/2015 Document Reviewed: 08/25/2015 Elsevier Interactive Patient Education  2019 [employer].    Platelet Transfusion A platelet transfusion is a procedure in which you receive donated platelets through an IV. Platelets are tiny pieces of blood cells. When you get an injury, platelets clump together in the area to form a blood clot. This helps stop bleeding and is the beginning of the healing process. If you have too few platelets, your blood may have trouble clotting. This may cause you to bleed and bruise very easily. You may need a platelet transfusion if you have a condition that causes a low number of platelets (thrombocytopenia). A platelet transfusion may be used to stop or prevent excessive bleeding. Tell a health care provider about:  Any reactions you have had during previous transfusions.  Any allergies you have.  All medicines you are taking, including vitamins, herbs, eye drops, creams, and over-the-counter medicines.  Any blood disorders you have.  Any surgeries you have had.  Any medical conditions you have.  Whether you are pregnant or  may be pregnant. What are the risks? Generally, this is a safe procedure. However, problems may occur, including:  Fever.  Infection.  Allergic reaction to the donor platelets.  Your body's disease-fighting system (immune system) attacking the donor platelets (hemolytic reaction). This is rare.  A rare reaction that causes lung damage (transfusion-related acute lung injury). What happens before the  procedure? Medicines  Ask your health care provider about: ? Changing or stopping your regular medicines. This is especially important if you are taking diabetes medicines or blood thinners. ? Taking medicines such as aspirin and ibuprofen. These medicines can thin your blood. Do not take these medicines unless your health care provider tells you to take them. ? Taking over-the-counter medicines, vitamins, herbs, and supplements. General instructions  You will have a blood test to determine your blood type. Your blood type determines what kind of platelets you will be given.  Follow instructions from your health care provider about eating or drinking restrictions.  If you have had an allergic reaction to a transfusion in the past, you may be given medicine to help prevent a reaction.  Your temperature, blood pressure, pulse, and breathing will be monitored. What happens during the procedure?   An IV will be inserted into one of your veins.  For your safety, two health care providers will verify your identity along with the donor platelets about to be infused.  A bag of donor platelets will be connected to your IV. The platelets will flow into your bloodstream. This usually takes 30-60 minutes.  Your temperature, blood pressure, pulse, and breathing will be monitored during the transfusion. This helps detect early signs of any reaction.  You will also be monitored for other symptoms that may indicate a reaction, including chills, hives, or itching.  If you have signs of a reaction at any time, your transfusion will be stopped, and you may be given medicine to help manage the reaction.  When your transfusion is complete, your IV will be removed.  Pressure may be applied to the IV site for a few minutes to stop any bleeding.  The IV site will be covered with a bandage (dressing). The procedure may vary among health care providers and hospitals. What happens after the  procedure?  Your blood pressure, temperature, pulse, and breathing will be monitored until you leave the hospital or clinic.  You may have some bruising and soreness at your IV site. Follow these instructions at home: Medicines  Take over-the-counter and prescription medicines only as told by your health care provider.  Talk with your health care provider before you take any medicines that contain aspirin or NSAIDs. These medicines increase your risk for dangerous bleeding. General instructions  Change or remove your dressing as told by your health care provider.  Return to your normal activities as told by your health care provider. Ask your health care provider what activities are safe for you.  Do not take baths, swim, or use a hot tub until your health care provider approves. Ask your health care provider if you may take showers.  Check your IV site every day for signs of infection. Check for: ? Redness, swelling, or pain. ? Fluid or blood. If fluid or blood drains from your IV site, use your hands to press down firmly on a bandage covering the area for a minute or two. Doing this should stop the bleeding. ? Warmth. ? Pus or a bad smell.  Keep all follow-up visits as told by your  health care provider. This is important. Contact a health care provider if you have:  A headache that does not go away with medicine.  Hives, rash, or itchy skin.  Nausea or vomiting.  Unusual tiredness or weakness.  Signs of infection at your IV site. Get help right away if:  You have a fever or chills.  You urinate less often than usual.  Your urine is darker colored than normal.  You have any of the following: ? Trouble breathing. ? Pain in your back, abdomen, or chest. ? Cool, clammy skin. ? A fast heartbeat. Summary  Platelets are tiny pieces of blood cells that clump together to form a blood clot when you have an injury. If you have too few platelets, your blood may have trouble  clotting.  A platelet transfusion is a procedure in which you receive donated platelets through an IV.  A platelet transfusion may be used to stop or prevent excessive bleeding.  After the procedure, check your IV site every day for signs of infection, including redness, swelling, pain, or warmth. This information is not intended to replace advice given to you by your health care provider. Make sure you discuss any questions you have with your health care provider. Document Released: 10/28/2006 Document Revised: 02/05/2017 Document Reviewed: 02/05/2017 Elsevier Interactive Patient Education  2019 [employer].

## 2018-02-03 NOTE — Progress Notes (Signed)
The patient was seen in clinic prior to treatment. He has 7 treatments left to the right neck base. He has developed moist desquamation since last Friday. He's been using sonafine and neosporin with pain relief. He was counseled on using silvadene and does not have any sulfa allergies he is aware of. He was also given a jar of this as well as hydrogel pads to use in an alternating fashion. We will follow this expectantly but if his skin is not improving, he will let us know sooner.

## 2018-02-03 NOTE — Progress Notes (Signed)
Pt transferred to infusion area (H section) to complete his transfusions for today.. Report given to [facility], RN  Pt and his wife understand to come back tomorrow morning@ 7:30 for his 2nd unit of packed RBC's. He has tolerated transfusion of platelts and 1st unit of blood well. No evidence of transfusion reaction @ this time

## 2018-02-04 ENCOUNTER — Other Ambulatory Visit: Payer: Self-pay | Admitting: Medical Oncology

## 2018-02-04 ENCOUNTER — Telehealth: Payer: Self-pay | Admitting: Internal Medicine

## 2018-02-04 ENCOUNTER — Inpatient Hospital Stay: Payer: Medicare Other

## 2018-02-04 ENCOUNTER — Ambulatory Visit
Admission: RE | Admit: 2018-02-04 | Discharge: 2018-02-04 | Disposition: A | Discharge status: Home / Self Care | Payer: Medicare Other | Source: Ambulatory Visit | Attending: Radiation Oncology | Admitting: Radiation Oncology

## 2018-02-04 DIAGNOSIS — Z510 Encounter for antineoplastic radiation therapy: Secondary | ICD-10-CM | POA: Diagnosis not present

## 2018-02-04 DIAGNOSIS — D702 Other drug-induced agranulocytosis: Secondary | ICD-10-CM

## 2018-02-04 DIAGNOSIS — R112 Nausea with vomiting, unspecified: Secondary | ICD-10-CM

## 2018-02-04 DIAGNOSIS — Z5111 Encounter for antineoplastic chemotherapy: Secondary | ICD-10-CM | POA: Diagnosis not present

## 2018-02-04 DIAGNOSIS — D649 Anemia, unspecified: Secondary | ICD-10-CM

## 2018-02-04 LAB — BPAM PLATELET PHERESIS
Blood Product Expiration Date: 202001222359
ISSUE DATE / TIME: 202001211426
Unit Type and Rh: 600

## 2018-02-04 LAB — PREPARE PLATELET PHERESIS: Unit division: 0

## 2018-02-04 LAB — PREPARE RBC (CROSSMATCH)

## 2018-02-04 MED ORDER — TBO-FILGRASTIM 480 MCG/0.8ML ~~LOC~~ SOSY
PREFILLED_SYRINGE | SUBCUTANEOUS | Status: AC
  Filled 2018-02-04: qty 0.8

## 2018-02-04 MED ORDER — DIPHENHYDRAMINE HCL 25 MG PO CAPS
25.0000 mg | ORAL_CAPSULE | Freq: Once | ORAL | Status: AC
  Administered 2018-02-04: 25 mg via ORAL

## 2018-02-04 MED ORDER — ONDANSETRON HCL 4 MG/2ML IJ SOLN
8.0000 mg | Freq: Once | INTRAMUSCULAR | Status: AC
  Administered 2018-02-04: 8 mg via INTRAVENOUS

## 2018-02-04 MED ORDER — SODIUM CHLORIDE 0.9% FLUSH
10.0000 mL | Freq: Once | INTRAVENOUS | Status: AC
  Administered 2018-02-04: 10 mL via INTRAVENOUS
  Filled 2018-02-04: qty 10

## 2018-02-04 MED ORDER — ACETAMINOPHEN 325 MG PO TABS
ORAL_TABLET | ORAL | Status: AC
  Filled 2018-02-04: qty 2

## 2018-02-04 MED ORDER — ONDANSETRON HCL 4 MG/2ML IJ SOLN
INTRAMUSCULAR | Status: AC
  Filled 2018-02-04: qty 4

## 2018-02-04 MED ORDER — DIPHENHYDRAMINE HCL 25 MG PO CAPS
ORAL_CAPSULE | ORAL | Status: AC
  Filled 2018-02-04: qty 1

## 2018-02-04 MED ORDER — TBO-FILGRASTIM 480 MCG/0.8ML ~~LOC~~ SOSY
480.0000 ug | PREFILLED_SYRINGE | Freq: Once | SUBCUTANEOUS | Status: AC
  Administered 2018-02-04: 480 ug via SUBCUTANEOUS

## 2018-02-04 MED ORDER — SODIUM CHLORIDE 0.9 % IV SOLN
INTRAVENOUS | Status: DC
  Administered 2018-02-04: 10:00:00 via INTRAVENOUS
  Filled 2018-02-04 (×2): qty 250

## 2018-02-04 MED ORDER — SODIUM CHLORIDE 0.9 % IV SOLN: Freq: Once | INTRAVENOUS | Status: DC

## 2018-02-04 MED ORDER — HEPARIN SOD (PORK) LOCK FLUSH 100 UNIT/ML IV SOLN
500.0000 [IU] | Freq: Once | INTRAVENOUS | Status: AC
  Administered 2018-02-04: 500 [IU] via INTRAVENOUS
  Filled 2018-02-04: qty 5

## 2018-02-04 MED ORDER — ACETAMINOPHEN 325 MG PO TABS
650.0000 mg | ORAL_TABLET | Freq: Once | ORAL | Status: AC
  Administered 2018-02-04: 650 mg via ORAL

## 2018-02-04 MED ORDER — SODIUM CHLORIDE 0.9% IV SOLUTION
250.0000 mL | Freq: Once | INTRAVENOUS | Status: AC
  Administered 2018-02-04: 250 mL via INTRAVENOUS
  Filled 2018-02-04: qty 250

## 2018-02-04 NOTE — Telephone Encounter (Signed)
Scheduled appt per 1/22 sch message - pt is aware.

## 2018-02-04 NOTE — Telephone Encounter (Signed)
Patient wife came and got a print out of the calendar.

## 2018-02-04 NOTE — Patient Instructions (Addendum)
Blood Transfusion, Adult, Care After This sheet gives you information about how to care for yourself after your procedure. Your doctor may also give you more specific instructions. If you have problems or questions, contact your doctor. Follow these instructions at home:   Take over-the-counter and prescription medicines only as told by your doctor.  Go back to your normal activities as told by your doctor.  Follow instructions from your doctor about how to take care of the area where an IV tube was put into your vein (insertion site). Make sure you: ? Wash your hands with soap and water before you change your bandage (dressing). If there is no soap and water, use hand sanitizer. ? Change your bandage as told by your doctor.  Check your IV insertion site every day for signs of infection. Check for: ? More redness, swelling, or pain. ? More fluid or blood. ? Warmth. ? Pus or a bad smell. Contact a doctor if:  You have more redness, swelling, or pain around the IV insertion site.  You have more fluid or blood coming from the IV insertion site.  Your IV insertion site feels warm to the touch.  You have pus or a bad smell coming from the IV insertion site.  Your pee (urine) turns pink, red, or brown.  You feel weak after doing your normal activities. Get help right away if:  You have signs of a serious allergic or body defense (immune) system reaction, including: ? Itchiness. ? Hives. ? Trouble breathing. ? Anxiety. ? Pain in your chest or lower back. ? Fever, flushing, and chills. ? Fast pulse. ? Rash. ? Watery poop (diarrhea). ? Throwing up (vomiting). ? Dark pee. ? Serious headache. ? Dizziness. ? Stiff neck. ? Yellow color in your face or the white parts of your eyes (jaundice). Summary  After a blood transfusion, return to your normal activities as told by your doctor.  Every day, check for signs of infection where the IV tube was put into your vein.  Some  signs of infection are warm skin, more redness and pain, more fluid or blood, and pus or a bad smell where the needle went in.  Contact your doctor if you feel weak or have any unusual symptoms. This information is not intended to replace advice given to you by your health care provider. Make sure you discuss any questions you have with your health care provider. Document Released: 01/21/2014 Document Revised: 08/25/2015 Document Reviewed: 08/25/2015 Elsevier Interactive Patient Education  2019 [employer].   Tbo-Filgrastim injection Granix) What is this medicine? TBO-FILGRASTIM (T B O fil GRA stim) is a granulocyte colony-stimulating factor that stimulates the growth of neutrophils, a type of white blood cell important in the body's fight against infection. It is used to reduce the incidence of fever and infection in patients with certain types of cancer who are receiving chemotherapy that affects the bone marrow. This medicine may be used for other purposes; ask your health care provider or pharmacist if you have questions. COMMON BRAND NAME(S): Granix What should I tell my health care provider before I take this medicine? They need to know if you have any of these conditions: -bone scan or tests planned -kidney disease -sickle cell anemia -an unusual or allergic reaction to tbo-filgrastim, filgrastim, pegfilgrastim, other medicines, foods, dyes, or preservatives -pregnant or trying to get pregnant -breast-feeding How should I use this medicine? This medicine is for injection under the skin. If you get this medicine at home,  you will be taught how to prepare and give this medicine. Refer to the Instructions for Use that come with your medication packaging. Use exactly as directed. Take your medicine at regular intervals. Do not take your medicine more often than directed. It is important that you put your used needles and syringes in a special sharps container. Do not put them in a trash  can. If you do not have a sharps container, call your pharmacist or healthcare provider to get one. Talk to your pediatrician regarding the use of this medicine in children. While this drug may be prescribed for children as young as 45 month of age for selected conditions, precautions do apply. Overdosage: If you think you have taken too much of this medicine contact a poison control center or emergency room at once. NOTE: This medicine is only for you. Do not share this medicine with others. What if I miss a dose? It is important not to miss your dose. Call your doctor or health care professional if you miss a dose. What may interact with this medicine? This medicine may interact with the following medications: -medicines that may cause a release of neutrophils, such as lithium This list may not describe all possible interactions. Give your health care provider a list of all the medicines, herbs, non-prescription drugs, or dietary supplements you use. Also tell them if you smoke, drink alcohol, or use illegal drugs. Some items may interact with your medicine. What should I watch for while using this medicine? You may need blood work done while you are taking this medicine. What side effects may I notice from receiving this medicine? Side effects that you should report to your doctor or health care professional as soon as possible: -allergic reactions like skin rash, itching or hives, swelling of the face, lips, or tongue -back pain -blood in the urine -dark urine -dizziness -fast heartbeat -feeling faint -shortness of breath or breathing problems -signs and symptoms of infection like fever or chills; cough; or sore throat -signs and symptoms of kidney injury like trouble passing urine or change in the amount of urine -stomach or side pain, or pain at the shoulder -sweating -swelling of the legs, ankles, or abdomen -tiredness Side effects that usually do not require medical attention  (report to your doctor or health care professional if they continue or are bothersome): -bone pain -diarrhea -headache -muscle pain -vomiting This list may not describe all possible side effects. Call your doctor for medical advice about side effects. You may report side effects to FDA at 1-800-FDA-1088. Where should I keep my medicine? Keep out of the reach of children. Store in a refrigerator between 2 and 8 degrees C (36 and 46 degrees F). Keep in carton to protect from light. Throw away this medicine if it is left out of the refrigerator for more than 5 consecutive days. Throw away any unused medicine after the expiration date. NOTE: This sheet is a summary. It may not cover all possible information. If you have questions about this medicine, talk to your doctor, pharmacist, or health care provider.  2019 Elsevier/[employer] (2016-08-20 16:56:18)

## 2018-02-05 ENCOUNTER — Ambulatory Visit
Admission: RE | Admit: 2018-02-05 | Discharge: 2018-02-05 | Disposition: A | Discharge status: Home / Self Care | Payer: Medicare Other | Source: Ambulatory Visit | Attending: Radiation Oncology | Admitting: Radiation Oncology

## 2018-02-05 ENCOUNTER — Inpatient Hospital Stay: Payer: Medicare Other

## 2018-02-05 DIAGNOSIS — Z510 Encounter for antineoplastic radiation therapy: Secondary | ICD-10-CM | POA: Diagnosis not present

## 2018-02-05 DIAGNOSIS — Z5111 Encounter for antineoplastic chemotherapy: Secondary | ICD-10-CM | POA: Diagnosis not present

## 2018-02-05 DIAGNOSIS — D702 Other drug-induced agranulocytosis: Secondary | ICD-10-CM

## 2018-02-05 LAB — BPAM RBC
BLOOD PRODUCT EXPIRATION DATE: 202002232359
Blood Product Expiration Date: 202002232359
ISSUE DATE / TIME: 202001220831
ISSUE DATE / TIME: 202001211348
UNIT TYPE AND RH: 5100
Unit Type and Rh: 5100

## 2018-02-05 LAB — TYPE AND SCREEN
ABO/RH(D): O POS
Antibody Screen: NEGATIVE
Unit division: 0
Unit division: 0

## 2018-02-05 MED ORDER — TBO-FILGRASTIM 480 MCG/0.8ML ~~LOC~~ SOSY
480.0000 ug | PREFILLED_SYRINGE | Freq: Once | SUBCUTANEOUS | Status: AC
  Administered 2018-02-05: 480 ug via SUBCUTANEOUS

## 2018-02-05 MED ORDER — TBO-FILGRASTIM 480 MCG/0.8ML ~~LOC~~ SOSY
PREFILLED_SYRINGE | SUBCUTANEOUS | Status: AC
  Filled 2018-02-05: qty 0.8

## 2018-02-05 NOTE — Patient Instructions (Signed)
Tbo-Filgrastim injection What is this medicine? TBO-FILGRASTIM (T B O fil GRA stim) is a granulocyte colony-stimulating factor that stimulates the growth of neutrophils, a type of white blood cell important in the body's fight against infection. It is used to reduce the incidence of fever and infection in patients with certain types of cancer who are receiving chemotherapy that affects the bone marrow. This medicine may be used for other purposes; ask your health care provider or pharmacist if you have questions. COMMON BRAND NAME(S): Granix What should I tell my health care provider before I take this medicine? They need to know if you have any of these conditions: -bone scan or tests planned -kidney disease -sickle cell anemia -an unusual or allergic reaction to tbo-filgrastim, filgrastim, pegfilgrastim, other medicines, foods, dyes, or preservatives -pregnant or trying to get pregnant -breast-feeding How should I use this medicine? This medicine is for injection under the skin. If you get this medicine at home, you will be taught how to prepare and give this medicine. Refer to the Instructions for Use that come with your medication packaging. Use exactly as directed. Take your medicine at regular intervals. Do not take your medicine more often than directed. It is important that you put your used needles and syringes in a special sharps container. Do not put them in a trash can. If you do not have a sharps container, call your pharmacist or healthcare provider to get one. Talk to your pediatrician regarding the use of this medicine in children. While this drug may be prescribed for children as young as 1 month of age for selected conditions, precautions do apply. Overdosage: If you think you have taken too much of this medicine contact a poison control center or emergency room at once. NOTE: This medicine is only for you. Do not share this medicine with others. What if I miss a dose? It is  important not to miss your dose. Call your doctor or health care professional if you miss a dose. What may interact with this medicine? This medicine may interact with the following medications: -medicines that may cause a release of neutrophils, such as lithium This list may not describe all possible interactions. Give your health care provider a list of all the medicines, herbs, non-prescription drugs, or dietary supplements you use. Also tell them if you smoke, drink alcohol, or use illegal drugs. Some items may interact with your medicine. What should I watch for while using this medicine? You may need blood work done while you are taking this medicine. What side effects may I notice from receiving this medicine? Side effects that you should report to your doctor or health care professional as soon as possible: -allergic reactions like skin rash, itching or hives, swelling of the face, lips, or tongue -back pain -blood in the urine -dark urine -dizziness -fast heartbeat -feeling faint -shortness of breath or breathing problems -signs and symptoms of infection like fever or chills; cough; or sore throat -signs and symptoms of kidney injury like trouble passing urine or change in the amount of urine -stomach or side pain, or pain at the shoulder -sweating -swelling of the legs, ankles, or abdomen -tiredness Side effects that usually do not require medical attention (report to your doctor or health care professional if they continue or are bothersome): -bone pain -diarrhea -headache -muscle pain -vomiting This list may not describe all possible side effects. Call your doctor for medical advice about side effects. You may report side effects to FDA at   1-800-FDA-1088. Where should I keep my medicine? Keep out of the reach of children. Store in a refrigerator between 2 and 8 degrees C (36 and 46 degrees F). Keep in carton to protect from light. Throw away this medicine if it is left out  of the refrigerator for more than 5 consecutive days. Throw away any unused medicine after the expiration date. NOTE: This sheet is a summary. It may not cover all possible information. If you have questions about this medicine, talk to your doctor, pharmacist, or health care provider.  2019 Elsevier/[employer] (2016-08-20 16:56:18)  

## 2018-02-06 ENCOUNTER — Ambulatory Visit
Admission: RE | Admit: 2018-02-06 | Discharge: 2018-02-06 | Disposition: A | Discharge status: Home / Self Care | Payer: Medicare Other | Source: Ambulatory Visit | Attending: Radiation Oncology | Admitting: Radiation Oncology

## 2018-02-06 DIAGNOSIS — Z510 Encounter for antineoplastic radiation therapy: Secondary | ICD-10-CM | POA: Diagnosis not present

## 2018-02-09 ENCOUNTER — Other Ambulatory Visit: Payer: Self-pay | Admitting: *Deleted

## 2018-02-09 ENCOUNTER — Inpatient Hospital Stay: Payer: Medicare Other

## 2018-02-09 ENCOUNTER — Encounter: Payer: Self-pay | Admitting: Internal Medicine

## 2018-02-09 ENCOUNTER — Ambulatory Visit
Admission: RE | Admit: 2018-02-09 | Discharge: 2018-02-09 | Disposition: A | Discharge status: Home / Self Care | Payer: Medicare Other | Source: Ambulatory Visit | Attending: Radiation Oncology | Admitting: Radiation Oncology

## 2018-02-09 ENCOUNTER — Inpatient Hospital Stay (HOSPITAL_BASED_OUTPATIENT_CLINIC_OR_DEPARTMENT_OTHER): Payer: Medicare Other | Admitting: Internal Medicine

## 2018-02-09 VITALS — BP 122/76 | HR 89 | Temp 98.4°F | Resp 18 | Ht 68.0 in | Wt 173.9 lb

## 2018-02-09 DIAGNOSIS — R0609 Other forms of dyspnea: Secondary | ICD-10-CM

## 2018-02-09 DIAGNOSIS — C3411 Malignant neoplasm of upper lobe, right bronchus or lung: Secondary | ICD-10-CM

## 2018-02-09 DIAGNOSIS — Z5111 Encounter for antineoplastic chemotherapy: Secondary | ICD-10-CM

## 2018-02-09 DIAGNOSIS — D6481 Anemia due to antineoplastic chemotherapy: Secondary | ICD-10-CM

## 2018-02-09 DIAGNOSIS — R5383 Other fatigue: Secondary | ICD-10-CM

## 2018-02-09 DIAGNOSIS — R531 Weakness: Secondary | ICD-10-CM

## 2018-02-09 DIAGNOSIS — Z95828 Presence of other vascular implants and grafts: Secondary | ICD-10-CM

## 2018-02-09 DIAGNOSIS — Z510 Encounter for antineoplastic radiation therapy: Secondary | ICD-10-CM | POA: Diagnosis not present

## 2018-02-09 DIAGNOSIS — R1310 Dysphagia, unspecified: Secondary | ICD-10-CM

## 2018-02-09 DIAGNOSIS — I10 Essential (primary) hypertension: Secondary | ICD-10-CM

## 2018-02-09 DIAGNOSIS — J449 Chronic obstructive pulmonary disease, unspecified: Secondary | ICD-10-CM

## 2018-02-09 DIAGNOSIS — E46 Unspecified protein-calorie malnutrition: Secondary | ICD-10-CM

## 2018-02-09 DIAGNOSIS — R05 Cough: Secondary | ICD-10-CM

## 2018-02-09 DIAGNOSIS — D649 Anemia, unspecified: Secondary | ICD-10-CM

## 2018-02-09 LAB — CMP (CANCER CENTER ONLY)
ALT: 8 U/L (ref 0–44)
ANION GAP: 7 (ref 5–15)
AST: 9 U/L — ABNORMAL LOW (ref 15–41)
Albumin: 3.3 g/dL — ABNORMAL LOW (ref 3.5–5.0)
Alkaline Phosphatase: 69 U/L (ref 38–126)
BUN: 12 mg/dL (ref 8–23)
CALCIUM: 9.1 mg/dL (ref 8.9–10.3)
CO2: 32 mmol/L (ref 22–32)
Chloride: 96 mmol/L — ABNORMAL LOW (ref 98–111)
Creatinine: 0.87 mg/dL (ref 0.61–1.24)
Glucose, Bld: 140 mg/dL — ABNORMAL HIGH (ref 70–99)
Potassium: 3.2 mmol/L — ABNORMAL LOW (ref 3.5–5.1)
Sodium: 135 mmol/L (ref 135–145)
Total Bilirubin: 0.5 mg/dL (ref 0.3–1.2)
Total Protein: 6.1 g/dL — ABNORMAL LOW (ref 6.5–8.1)

## 2018-02-09 LAB — CBC WITH DIFFERENTIAL (CANCER CENTER ONLY)
Abs Immature Granulocytes: 0.05 10*3/uL (ref 0.00–0.07)
Basophils Absolute: 0 10*3/uL (ref 0.0–0.1)
Basophils Relative: 0 %
EOS ABS: 0 10*3/uL (ref 0.0–0.5)
Eosinophils Relative: 0 %
HCT: 24.6 % — ABNORMAL LOW (ref 39.0–52.0)
Hemoglobin: 8.1 g/dL — ABNORMAL LOW (ref 13.0–17.0)
Immature Granulocytes: 2 %
Lymphocytes Relative: 7 %
Lymphs Abs: 0.2 10*3/uL — ABNORMAL LOW (ref 0.7–4.0)
MCH: 28.8 pg (ref 26.0–34.0)
MCHC: 32.9 g/dL (ref 30.0–36.0)
MCV: 87.5 fL (ref 80.0–100.0)
Monocytes Absolute: 0.6 10*3/uL (ref 0.1–1.0)
Monocytes Relative: 22 %
NRBC: 0 % (ref 0.0–0.2)
Neutro Abs: 1.9 10*3/uL (ref 1.7–7.7)
Neutrophils Relative %: 69 %
Platelet Count: 113 10*3/uL — ABNORMAL LOW (ref 150–400)
RBC: 2.81 MIL/uL — ABNORMAL LOW (ref 4.22–5.81)
RDW: 16.5 % — ABNORMAL HIGH (ref 11.5–15.5)
WBC Count: 2.8 10*3/uL — ABNORMAL LOW (ref 4.0–10.5)

## 2018-02-09 LAB — MAGNESIUM: Magnesium: 1.6 mg/dL — ABNORMAL LOW (ref 1.7–2.4)

## 2018-02-09 MED ORDER — HEPARIN SOD (PORK) LOCK FLUSH 100 UNIT/ML IV SOLN
500.0000 [IU] | Freq: Once | INTRAVENOUS | Status: AC | PRN
  Administered 2018-02-09: 500 [IU]
  Filled 2018-02-09: qty 5

## 2018-02-09 MED ORDER — SODIUM CHLORIDE 0.9% FLUSH
10.0000 mL | INTRAVENOUS | Status: DC | PRN
  Administered 2018-02-09: 10 mL
  Filled 2018-02-09: qty 10

## 2018-02-09 NOTE — Progress Notes (Signed)
Pt will need 2 units of blood, to be given this week. Orders entered.  Message to scheduling for lab appt and blood.

## 2018-02-09 NOTE — Progress Notes (Signed)
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  PRIOR THERAPY: None  CURRENT THERAPY: Cisplatin 80 mg meter squared on day 1 with etoposide 100 mg/m2 days 1, 2, and 3 every 3 weeks.  This will be given concurrently with radiation.  Cycle 1 started on 12/10/2017.  Radiation to begin 12/30/2017.  Status post 3 cycles.  INTERVAL HISTORY: Scott Gomez 84 y.o. male returns to the clinic today for follow-up visit accompanied by his wife and daughter.  The patient continues to complain of increasing fatigue and weakness.  He also has a skin burn from the radiation therapy as well as odynophagia and dysphagia.  He is followed by Dr. Lisbeth Renshaw for his radiation therapy.  He is currently on Carafate.  The patient denied having any chest pain but has shortness of breath with exertion with mild cough and no hemoptysis.  He denied having any current fever or chills.  He has no current nausea, vomiting, diarrhea or constipation.  His last cycle of the chemotherapy was complicated with significant pancytopenia requiring PRBCs and platelet transfusion as well as growth factors.  The patient is here today for evaluation before starting cycle #4.  MEDICAL HISTORY: Past Medical History:  Diagnosis Date  . Anxiety   . Arthritis    osteoarthritis both hands  . Carpal tunnel syndrome   . COPD (chronic obstructive pulmonary disease) ([place])   . CPAP (continuous positive airway pressure) dependence   . Diabetes mellitus    type II  . Fatigue   . History of kidney stones   . Hypercholesteremia   . Hypopotassemia   . Leg cramps   . Lung cancer ([place])   .  Nephrolithiasis    Hx of  . Palpitations    Hx of   . Shoulder pain, left   . Snoring   . Testosterone deficiency   . Ulcer    peptic ulcer disease    ALLERGIES:  is allergic to buspirone hcl; crestor [rosuvastatin calcium]; and prednisone.  MEDICATIONS:  Current Outpatient Medications  Medication Sig Dispense Refill  . albuterol (PROAIR HFA) 108 (90 Base) MCG/ACT inhaler Inhale 2 puffs into the lungs every 4 (four) hours as needed for wheezing or shortness of breath. (Patient not taking: Reported on 02/03/2018) 1 Inhaler 2  . ALPRAZolam (XANAX) 0.5 MG tablet TAKE ONE (1) TABLET BY MOUTH TWO (2) TIMES DAILY AS NEEDED FOR ANXIETY 60 tablet 3  . apixaban (ELIQUIS) 5 MG TABS tablet Take 1 tablet (5 mg total) by mouth 2 (two) times daily. 60 tablet 11  . Blood Glucose Monitoring Suppl (ONETOUCH VERIO IQ SYSTEM) w/Device KIT Use to test blood sugar once daily and as needed dx R73.9 1 kit 0  . cyclobenzaprine (FLEXERIL) 10 MG tablet Take 10 mg by mouth 2 (two) times daily.     Marland Kitchen docusate sodium (COLACE) 100 MG capsule Take 100 mg by mouth daily.    Marland Kitchen gabapentin (NEURONTIN) 100 MG capsule Take 200 mg by mouth 3 (three) times daily.     Marland Kitchen glucose blood (ONETOUCH VERIO) test strip USE TO CHECK BLOOD SUGAR ONCE DAILY 100 each 3  . glucose blood test strip Use as  instructed 100 each 3  . hyoscyamine (LEVSIN SL) 0.125 MG SL tablet Place 1 tablet (0.125 mg total) under the tongue every 12 (twelve) hours as needed. 30 tablet 0  . levalbuterol (XOPENEX) 0.63 MG/3ML nebulizer solution Take 3 mLs (0.63 mg total) by nebulization every 4 (four) hours as needed for wheezing or shortness of breath. 3 mL 12  . lidocaine-prilocaine (EMLA) cream Apply to port 1 hour prior port access 30 g 0  . lisinopril (PRINIVIL,ZESTRIL) 10 MG tablet Take 1 tablet (10 mg total) by mouth daily. (Patient taking differently: Take 5 mg by mouth daily. ) 90 tablet 3  . lubiprostone (AMITIZA) 24 MCG capsule Take 1 capsule (24 mcg  total) by mouth 2 (two) times daily with a meal. 60 capsule 3  . magnesium oxide (MAG-OX) 400 (241.3 Mg) MG tablet Take 1 tablet (400 mg total) by mouth daily. 30 tablet 1  . ondansetron (ZOFRAN) 8 MG tablet Take 1 tablet (8 mg total) by mouth every 8 (eight) hours as needed for nausea or vomiting. 30 tablet 1  . ONE TOUCH LANCETS MISC Use to test blood sugar once daily and as needed. Dx 790.29 200 each 1  . Oxycodone HCl 10 MG TABS Take 1 tablet by mouth every 6 (six) hours as needed (pain).     . pantoprazole (PROTONIX) 40 MG tablet Take 1 tablet (40 mg total) by mouth 2 (two) times daily. 180 tablet 3  . PARoxetine (PAXIL) 10 MG tablet Take 1 tablet (10 mg total) by mouth daily. 30 tablet 11  . potassium chloride SA (K-DUR,KLOR-CON) 20 MEQ tablet TAKE ONE (1) TABLET BY MOUTH EVERY DAY (Patient taking differently: Take 20 mEq by mouth daily. ) 90 tablet 3  . prochlorperazine (COMPAZINE) 10 MG tablet TAKE 1 TABLET (10 MG TOTAL) BY MOUTH EVERY 6 (SIX) HOURS AS NEEDED FOR NAUSEA OR VOMITING. 30 tablet 0  . scopolamine (TRANSDERM-SCOP) 1 MG/3DAYS Place 1 patch (1.5 mg total) onto the skin every 3 (three) days. 10 patch 12  . sucralfate (CARAFATE) 1 g tablet Take 1 tablet (1 g total) by mouth 4 (four) times daily -  with meals and at bedtime. 5 min before meals for radiation induced esophagitis 120 tablet 2  . Tiotropium Bromide Monohydrate (SPIRIVA RESPIMAT) 2.5 MCG/ACT AERS Inhale 2 puffs into the lungs daily. 4 g 11  . travoprost, benzalkonium, (TRAVATAN) 0.004 % ophthalmic solution Place 1 drop into both eyes at bedtime.      . vitamin B-12 (CYANOCOBALAMIN) 1000 MCG tablet Take 1,000 mcg by mouth daily.     No current facility-administered medications for this visit.    Facility-Administered Medications Ordered in Other Visits  Medication Dose Route Frequency Provider Last Rate Last Dose  . heparin lock flush 100 unit/mL  500 Units Intracatheter Daily PRN Curt Bears, MD      . sodium  chloride flush (NS) 0.9 % injection 10 mL  10 mL Intracatheter PRN Curt Bears, MD        SURGICAL HISTORY:  Past Surgical History:  Procedure Laterality Date  . BACK SURGERY  1978   x2 disc  . IR IMAGING GUIDED PORT INSERTION  12/19/2017    REVIEW OF SYSTEMS:  Constitutional: positive for anorexia, fatigue and weight loss Eyes: negative Ears, nose, mouth, throat, and face: negative Respiratory: positive for dyspnea on exertion Cardiovascular: negative Gastrointestinal: positive for dysphagia and odynophagia Genitourinary:negative Integument/breast: negative Hematologic/lymphatic: negative Musculoskeletal:positive for muscle weakness Neurological: negative Behavioral/Psych: negative Endocrine: negative Allergic/Immunologic: negative  PHYSICAL EXAMINATION: General appearance: alert, cooperative, fatigued and no distress Head: Normocephalic, without obvious abnormality, atraumatic Neck: no adenopathy, no JVD, supple, symmetrical, trachea midline and thyroid not enlarged, symmetric, no tenderness/mass/nodules Lymph nodes: Cervical, supraclavicular, and axillary nodes normal. Resp: clear to auscultation bilaterally Back: symmetric, no curvature. ROM normal. No CVA tenderness. Cardio: regular rate and rhythm, S1, S2 normal, no murmur, click, rub or gallop GI: soft, non-tender; bowel sounds normal; no masses,  no organomegaly Extremities: extremities normal, atraumatic, no cyanosis or edema Neurologic: Alert and oriented X 3, normal strength and tone. Normal symmetric reflexes. Normal coordination and gait  ECOG PERFORMANCE STATUS: 1 - Symptomatic but completely ambulatory  Blood pressure 122/76, pulse 89, temperature 98.4 F (36.9 C), temperature source Oral, resp. rate 18, height _0  (1.727 m), weight 173 lb 14.4 oz (78.9 kg), SpO2 100 %.  LABORATORY DATA: Lab Results  Component Value Date   WBC 2.8 (L) 02/09/2018   HGB 8.1 (L) 02/09/2018   HCT 24.6 (L) 02/09/2018    MCV 87.5 02/09/2018   PLT 113 (L) 02/09/2018      Chemistry      Component Value Date/Time   NA 135 02/09/2018 1431   K 3.2 (L) 02/09/2018 1431   CL 96 (L) 02/09/2018 1431   CO2 32 02/09/2018 1431   BUN 12 02/09/2018 1431   CREATININE 0.87 02/09/2018 1431      Component Value Date/Time   CALCIUM 9.1 02/09/2018 1431   ALKPHOS 69 02/09/2018 1431   AST 9 (L) 02/09/2018 1431   ALT 8 02/09/2018 1431   BILITOT 0.5 02/09/2018 1431       RADIOGRAPHIC STUDIES: Ct Chest W Contrast  Result Date: 01/20/2018 CLINICAL DATA:  84 year old male with history of right-sided lung cancer diagnosed in November 2019 treated with chemotherapy and radiation therapy. 13 pound weight loss over the past 2 months. Dyspnea on exertion. EXAM: CT CHEST WITH CONTRAST TECHNIQUE: Multidetector CT imaging of the chest was performed during intravenous contrast administration. CONTRAST:  83m OMNIPAQUE IOHEXOL 300 MG/ML  SOLN COMPARISON:  Chest CT 12/16/2017. FINDINGS: Cardiovascular: Heart size is normal. Small amount of pericardial fluid and/or thickening, similar to the prior study and unlikely to be of hemodynamic significance at this time. No associated pericardial calcification. There is aortic atherosclerosis, as well as atherosclerosis of the great vessels of the mediastinum and the coronary arteries, including calcified atherosclerotic plaque in the left main, left anterior descending, left circumflex and right coronary arteries. In addition, there is aneurysmal dilatation of the ascending thoracic aorta which measures up to 4.6 cm in diameter. Moderate calcifications of the aortic valve. Left-sided internal jugular single-lumen porta cath with tip terminating at the superior cavoatrial junction. Mediastinum/Nodes: There continues to be extensive right hilar and mediastinal lymphadenopathy, resulting in a confluent soft tissue mass extending from the superior mediastinum around the origin of the right common carotid  artery and innominate artery down to the level of the subcarinal region, involving all intervening nodal stations and extending into the right hilar region. This area is very irregular in shape and therefore difficult to accurately measure, however, the mass has clearly decreased in size compared to the prior study. This is best demonstrated in a relatively avascular region of the mass which currently measures approximately 5.2 x 3.4 cm (axial image 37 of series 2) as compared with 5.8 x 4.8 cm when measured in a similar fashion in the same region on axial image 14 of series 2 of prior study 12/16/2017.  This mass continues to exert local mass effect upon adjacent structures causing moderate to severe narrowing of the right internal jugular vein. Superior vena cava remains widely patent at this time. Esophagus is unremarkable in appearance. No axillary lymphadenopathy. Lungs/Pleura: Continued decrease in size of the primary right upper lobe lesion which currently measures 1.6 x 1.7 cm (axial image 26 of series 5). There are new patchy multifocal areas of peribronchovascular ground-glass attenuation scattered throughout the posterior aspect of the right upper lobe adjacent to the lesion and more laterally, presumably reflective of evolving postradiation changes. No new suspicious appearing pulmonary nodules or masses are noted. Trace right pleural effusion lying dependently, decreased compared to the prior study. No left pleural effusion. Upper Abdomen: Aortic atherosclerosis. Musculoskeletal: Old chronic L1 compression fracture with 60% loss of anterior vertebral body height. There are no aggressive appearing lytic or blastic lesions noted in the visualized portions of the skeleton. IMPRESSION: 1. Today's study demonstrates a positive response to therapy with decreased size of the primary right upper lobe lesion as well as the right hilar and mediastinal nodal mass, as detailed above. 2. Decreasing trace right  pleural effusion. 3. Evolving postradiation changes in the right upper lobe, as above. 4. Aortic atherosclerosis, in addition to left main and 3 vessel coronary artery disease. 5. In addition, there is aneurysmal dilatation of the ascending thoracic aorta which measures 4.6 cm in diameter. Ascending thoracic aortic aneurysm. Recommend semi-annual imaging followup by CTA or MRA and referral to cardiothoracic surgery if not already obtained. This recommendation follows 2010 ACCF/AHA/AATS/ACR/ASA/SCA/SCAI/SIR/STS/SVM Guidelines for the Diagnosis and Management of Patients With Thoracic Aortic Disease. Circulation. 2010; 121: T016-W109. Aortic Atherosclerosis (ICD10-I70.0). Electronically Signed   By: Vinnie Langton M.D.   On: 01/20/2018 09:44    ASSESSMENT AND PLAN: This is a very pleasant 84 years old white male recently diagnosed with limited stage small cell lung cancer presented with large perihilar mass in addition to bilateral hilar and mediastinal lymphadenopathy.  The patient was also found on recent MRI of the brain to have suspicious early metastatic disease to the brain which can make his final staging as extensive stage disease. I discussed the MRI results with the patient and his family. He was a started on systemic chemotherapy with cisplatin and etoposide status post 3 cycles.  This is concurrent with radiotherapy and he is expected to complete the radiation therapy later this week. He has a rough time with his treatment with significant pancytopenia requiring PRBCs and platelet transfusion.  The patient also required growth factor for the white blood count. I recommended for the patient to proceed with cycle #4 of his treatment tomorrow but I will reduce the dose of cisplatin to 60 mg/M2 on day 1 and etoposide to 80 mg/M2 on days 1, 2 and 3. I will see the patient back for follow-up visit in 3 weeks for evaluation before considering him for any additional treatment. For the  chemotherapy-induced anemia, I will arrange for the patient to receive 2 units of PRBCs transfusion this week. For the odynophagia and dysphagia, he will continue his current treatment with Carafate. The patient was advised to call immediately if he has any concerning symptoms in the interval. The patient voices understanding of current disease status and treatment options and is in agreement with the current care plan.  All questions were answered. The patient knows to call the clinic with any problems, questions or concerns. We can certainly see the patient much sooner if necessary.  Disclaimer:  This note was dictated with voice recognition software. Similar sounding words can inadvertently be transcribed and may not be corrected upon review.

## 2018-02-10 ENCOUNTER — Telehealth: Payer: Self-pay | Admitting: Internal Medicine

## 2018-02-10 ENCOUNTER — Inpatient Hospital Stay: Payer: Medicare Other

## 2018-02-10 ENCOUNTER — Ambulatory Visit
Admission: RE | Admit: 2018-02-10 | Discharge: 2018-02-10 | Disposition: A | Discharge status: Home / Self Care | Payer: Medicare Other | Source: Ambulatory Visit | Attending: Radiation Oncology | Admitting: Radiation Oncology

## 2018-02-10 ENCOUNTER — Other Ambulatory Visit: Payer: Self-pay | Admitting: *Deleted

## 2018-02-10 VITALS — BP 118/67 | HR 85 | Temp 98.2°F | Resp 16

## 2018-02-10 DIAGNOSIS — Z510 Encounter for antineoplastic radiation therapy: Secondary | ICD-10-CM | POA: Diagnosis not present

## 2018-02-10 DIAGNOSIS — C3411 Malignant neoplasm of upper lobe, right bronchus or lung: Secondary | ICD-10-CM

## 2018-02-10 DIAGNOSIS — D649 Anemia, unspecified: Secondary | ICD-10-CM

## 2018-02-10 DIAGNOSIS — Z5111 Encounter for antineoplastic chemotherapy: Secondary | ICD-10-CM | POA: Diagnosis not present

## 2018-02-10 MED ORDER — FAMOTIDINE IN NACL 20-0.9 MG/50ML-% IV SOLN
20.0000 mg | Freq: Once | INTRAVENOUS | Status: AC
  Administered 2018-02-10: 20 mg via INTRAVENOUS

## 2018-02-10 MED ORDER — SODIUM CHLORIDE 0.9 % IV SOLN
60.0000 mg/m2 | Freq: Once | INTRAVENOUS | Status: DC
  Filled 2018-02-10: qty 126

## 2018-02-10 MED ORDER — HEPARIN SOD (PORK) LOCK FLUSH 100 UNIT/ML IV SOLN
500.0000 [IU] | Freq: Once | INTRAVENOUS | Status: AC | PRN
  Filled 2018-02-10: qty 5

## 2018-02-10 MED ORDER — FAMOTIDINE IN NACL 20-0.9 MG/50ML-% IV SOLN
INTRAVENOUS | Status: AC
  Filled 2018-02-10: qty 50

## 2018-02-10 MED ORDER — LORAZEPAM 2 MG/ML IJ SOLN
INTRAMUSCULAR | Status: AC
  Filled 2018-02-10: qty 1

## 2018-02-10 MED ORDER — POTASSIUM CHLORIDE 2 MEQ/ML IV SOLN
Freq: Once | INTRAVENOUS | Status: AC
  Administered 2018-02-10: 10:00:00 via INTRAVENOUS
  Filled 2018-02-10: qty 20

## 2018-02-10 MED ORDER — PALONOSETRON HCL INJECTION 0.25 MG/5ML
INTRAVENOUS | Status: AC
  Filled 2018-02-10: qty 5

## 2018-02-10 MED ORDER — LORAZEPAM 2 MG/ML IJ SOLN
0.5000 mg | Freq: Once | INTRAMUSCULAR | Status: AC
  Administered 2018-02-10: 0.5 mg via INTRAVENOUS

## 2018-02-10 MED ORDER — SODIUM CHLORIDE 0.9 % IV SOLN
80.0000 mg/m2 | Freq: Once | INTRAVENOUS | Status: DC
  Filled 2018-02-10: qty 8.5

## 2018-02-10 MED ORDER — SODIUM CHLORIDE 0.9 % IV SOLN
80.0000 mg/m2 | Freq: Once | INTRAVENOUS | Status: AC
  Administered 2018-02-10: 170 mg via INTRAVENOUS
  Filled 2018-02-10: qty 8.5

## 2018-02-10 MED ORDER — SODIUM CHLORIDE 0.9 % IV SOLN
60.0000 mg/m2 | Freq: Once | INTRAVENOUS | Status: AC
  Administered 2018-02-10: 126 mg via INTRAVENOUS
  Filled 2018-02-10: qty 126

## 2018-02-10 MED ORDER — PALONOSETRON HCL INJECTION 0.25 MG/5ML
0.2500 mg | Freq: Once | INTRAVENOUS | Status: AC
  Administered 2018-02-10: 0.25 mg via INTRAVENOUS

## 2018-02-10 MED ORDER — SODIUM CHLORIDE 0.9% FLUSH
10.0000 mL | INTRAVENOUS | Status: AC | PRN
  Filled 2018-02-10: qty 10

## 2018-02-10 MED ORDER — DIPHENHYDRAMINE HCL 50 MG/ML IJ SOLN
INTRAMUSCULAR | Status: AC
  Filled 2018-02-10: qty 1

## 2018-02-10 MED ORDER — DIPHENHYDRAMINE HCL 50 MG/ML IJ SOLN
25.0000 mg | Freq: Once | INTRAMUSCULAR | Status: AC
  Administered 2018-02-10: 25 mg via INTRAVENOUS

## 2018-02-10 MED ORDER — SODIUM CHLORIDE 0.9 % IV SOLN
Freq: Once | INTRAVENOUS | Status: AC
  Administered 2018-02-10: 12:00:00 via INTRAVENOUS
  Filled 2018-02-10: qty 5

## 2018-02-10 MED ORDER — SODIUM CHLORIDE 0.9 % IV SOLN
Freq: Once | INTRAVENOUS | Status: AC
  Administered 2018-02-10: 09:00:00 via INTRAVENOUS
  Filled 2018-02-10: qty 250

## 2018-02-10 NOTE — Progress Notes (Signed)
Sympt[facility] Management Clinic Progress Note   Scott Gomez [id number] [birth year] 84 y.o.  Scott Gomez is managed by Dr. Fanny Bien. Scott Gomez  Actively treated with chemotherapy/immunotherapy/hormonal therapy: yes  Current Therapy: Cisplatin and etoposide with pegfilgrastim support and concurrent radiation therapy  Last Treated: 01/21/2018 (cycle 3)  Assessment: Plan:    Intractable vomiting with nausea, unspecified vomiting type - Plan: scopolamine (TRANSDERM-SCOP) 1 MG/3DAYS  Anemia, unspecified type - Plan: 0.9 %  sodium chloride infusion (Manually program via Guardrails IV Fluids), Transfuse platelet pheresis, diphenhydrAMINE (BENADRYL) capsule 25 mg, acetaminophen (TYLENOL) tablet 650 mg, Transfuse RBC, heparin lock flush 100 unit/mL, sodium chloride flush (NS) 0.9 % injection 10 mL, Care order/instruction, Complete patient signature process for consent form, Practitioner attestation of consent, Transfuse platelet pheresis, Transfuse RBC  Esophageal dysphagia - Plan: oxyCODONE-acetaminophen (PERCOCET/ROXICET) 5-325 MG per tablet 1 tablet  Thrombocytopenia (HCC)  Small cell lung cancer, right upper lobe (HCC)   Intractable nausea and vomiting: The patient was given a prescription for scopolamine patches with instructions to use 1 patch every 3 days.  Anemia: A CBC completed earlier today returned with a hemoglobin of 6.7.  Patient will receive 1 unit of packed red blood cells today and 1 unit of packed red blood cells tomorrow.  Esophageal dysphasia: The patient was having throat pain today and was given 1 Percocet.  Thrombocytopenia: A CBC completed today returned with a platelet count of 7.  The patient was given a platelet infusion today.  Small cell lung cancer: The patient continues to be treated with cisplatin and etoposide with concurrent radiation therapy and PEG filgrastim support.  His next cycle of chemotherapy is scheduled for 02/10/2018.  He will return for  labs on 02/04 and 02/11 and will see Dr. Julien Nordmann in follow-up on 03/03/2018.  Please see After Visit Summary for patient specific instructions.  Future Appointments  Date Time Provider [place]  02/10/2018  4:00 PM [facility] LINAC 3 [facility] None  02/11/2018  8:30 AM [facility] INFUSION [facility] None  02/11/2018 11:30 AM [facility] [id number] [facility] None  02/12/2018  8:30 AM [facility] INFUSION [facility] None  02/12/2018 11:30 AM [facility] [id number] [facility] None  02/13/2018 11:30 AM [facility] [id number] [facility] None  02/16/2018 11:30 AM [facility] [id number] [facility] None  02/17/2018 11:30 AM [facility] LAB 2 [facility] None  02/24/2018 11:30 AM [facility] LAB 3 [facility] None  02/25/2018 10:45 AM Rigoberto Noel, MD [facility] None  03/03/2018  8:00 AM [facility] LAB 4 [facility] None  03/03/2018  8:15 AM [facility] [place] FLUSH [facility] None  03/03/2018  8:30 AM Curt Bears, MD [facility] None  03/03/2018  9:00 AM [facility] INFUSION [facility] None  03/03/2018 11:15 AM Jennet Maduro, RD [facility] None  03/04/2018  9:30 AM [facility] INFUSION [facility] None  03/05/2018  9:15 AM [facility] INFUSION [facility] None  03/10/2018 11:30 AM [facility] LAB 2 [facility] None  03/17/2018 11:30 AM [facility] LAB 2 [facility] None  03/24/2018  8:00 AM [facility] LAB 2 [facility] None  03/24/2018  8:15 AM [facility] [place] FLUSH [facility] None  03/24/2018  8:45 AM Curt Bears, MD [facility] None  03/24/2018  9:00 AM [facility] INFUSION [facility] None  03/25/2018  9:30 AM [facility] INFUSION [facility] None  03/26/2018  9:00 AM [facility] INFUSION [facility] None  04/15/2018 10:00 AM Pinson, Venetia Maxon, LPN [facility]  07/14/2456  8:30 AM [name], [name], MD [facility]    Orders Placed This Encounter  Procedures  . Care order/instruction  . Complete patient signature process for consent form  .  Practitioner attestation of consent        Subjective:   Patient ID:  Scott Gomez is a 84 y.o. (DOB [birth year]) male.  Chief Complaint:  Chief Complaint  Patient presents with  . Anemia    low hgb, platelets, wbc    HPI Scott Gomez   is an 84 year old male with a history of a small cell carcinoma of the right lung who is managed by Dr. Fanny Bien. [name] and is status post cycle 3 of cisplatin and etoposide with pegfilgrastim support and concurrent radiation therapy.  Cycle 3 of chemotherapy was initiated on 01/21/2018.  He presents to the clinic today with a 13 pound weight loss since 01/20/2018.  He continues to have pain in his throat which is increasing in severity.  He is having trouble swallowing due to the pain and cannot swallow many of his medications.  He continues to have nausea and vomiting.  He reports having significant pain in his throat today.  He is getting a transfusion of platelets today and is scheduled to get 1 unit of red blood cells today and tomorrow.  Medications: I have reviewed the patient's current medications.  Allergies:  Allergies  Allergen Reactions  . Buspirone Hcl     REACTION: itching  . Crestor [Rosuvastatin Calcium]     Increased his blood sugar   . Prednisone     REACTION: sick, per wife he can take this.    Past Medical History:  Diagnosis Date  . Anxiety   . Arthritis    osteoarthritis both hands  . Carpal tunnel syndrome   . COPD (chronic obstructive pulmonary disease) ([place])   . CPAP (continuous positive airway pressure) dependence   . Diabetes mellitus    type II  . Fatigue   . History of kidney stones   . Hypercholesteremia   . Hypopotassemia   . Leg cramps   . Lung cancer ([place])   . Nephrolithiasis    Hx of  . Palpitations    Hx of   . Shoulder pain, left   . Snoring   . Testosterone deficiency   . Ulcer    peptic ulcer disease    Past Surgical History:  Procedure Laterality Date  . BACK SURGERY  1978   x2 disc  . IR IMAGING GUIDED PORT  INSERTION  12/19/2017    Family History  Problem Relation Age of Onset  . Heart disease Brother        MI  . Kidney cancer Brother   . Hypertension Mother   . Allergies Mother   . Hypertension Father   . Allergies Father   . Diabetes Father   . Heart disease Father   . Breast cancer Sister   . Kidney cancer Sister   . Colon cancer Neg Hx   . Liver cancer Neg Hx   . AAA (abdominal aortic aneurysm) Neg Hx     Social History   Socioeconomic History  . Marital status: Married    Spouse name: Not on file  . Number of children: 3  . Years of education: Not on file  . Highest education level: Not on file  Occupational History  . Occupation: retired  Scientific laboratory technician  . Financial resource strain: Not on file  . Food insecurity:    Worry: Not on file    Inability: Not on file  . Transportation needs:    Medical: Not on file    Non-medical: Not on file  Tobacco  Use  . Smoking status: Former Smoker    Packs/day: 2.00    Years: 38.00    Pack years: 76.00    Types: Cigarettes    Last attempt to quit: 01/14/1993    Years since quitting: 25.0  . Smokeless tobacco: Never Used  Substance and Sexual Activity  . Alcohol use: No    Alcohol/week: 0.0 standard drinks  . Drug use: No  . Sexual activity: Never  Lifestyle  . Physical activity:    Days per week: Not on file    Minutes per session: Not on file  . Stress: Not on file  Relationships  . Social connections:    Talks on phone: Not on file    Gets together: Not on file    Attends religious service: Not on file    Active member of club or organization: Not on file    Attends meetings of clubs or organizations: Not on file    Relationship status: Not on file  . Intimate partner violence:    Fear of current or ex partner: Not on file    Emotionally abused: Not on file    Physically abused: Not on file    Forced sexual activity: Not on file  Other Topics Concern  . Not on file  Social History Narrative   Lives with wife  Saurabh Hettich   Retired    Past Medical History, Surgical history, Social history, and Family history were reviewed and updated as appropriate.   Please see review of systems for further details on the patient's review from today.   Review of Systems:  Review of Systems  Constitutional: Positive for appetite change, fatigue and unexpected weight change. Negative for chills, diaphoresis and fever.  HENT: Positive for sore throat and trouble swallowing.   Respiratory: Negative for cough, choking, shortness of breath and wheezing.   Cardiovascular: Negative for chest pain and palpitations.  Gastrointestinal: Positive for constipation, nausea and vomiting. Negative for diarrhea.  Genitourinary: Negative for decreased urine volume.  Neurological: Negative for headaches.    Objective:   Physical Exam:  BP (!) 103/56   Pulse 97   Temp 98.3 F (36.8 C) (Oral)   Resp 18   SpO2 96%  ECOG: 1  Physical Exam Constitutional:      General: He is not in acute distress.    Appearance: He is not diaphoretic.     Comments: Patient is an elderly male who appears to be in no acute distress.  HENT:     Head: Normocephalic and atraumatic.     Mouth/Throat:     Mouth: Mucous membranes are moist.     Pharynx: No oropharyngeal exudate or posterior oropharyngeal erythema.  Cardiovascular:     Rate and Rhythm: Normal rate and regular rhythm.     Heart sounds: Normal heart sounds. No murmur. No friction rub. No gallop.   Pulmonary:     Effort: Pulmonary effort is normal. No respiratory distress.     Breath sounds: Normal breath sounds. No wheezing or rales.  Abdominal:     General: Bowel sounds are normal. There is no distension.     Tenderness: There is no abdominal tenderness.  Skin:    General: Skin is warm and dry.     Comments: There is an area of hyperpigmentation and scaling in the right superior to mid chest which is consistent with a radiation field.  Neurological:     Mental  Status: He is alert.  Gait: Gait abnormal (The patient is ambulating with use of a wheelchair.).     Lab Review:     Component Value Date/Time   NA 135 02/09/2018 1431   K 3.2 (L) 02/09/2018 1431   CL 96 (L) 02/09/2018 1431   CO2 32 02/09/2018 1431   GLUCOSE 140 (H) 02/09/2018 1431   BUN 12 02/09/2018 1431   CREATININE 0.87 02/09/2018 1431   CALCIUM 9.1 02/09/2018 1431   PROT 6.1 (L) 02/09/2018 1431   ALBUMIN 3.3 (L) 02/09/2018 1431   AST 9 (L) 02/09/2018 1431   ALT 8 02/09/2018 1431   ALKPHOS 69 02/09/2018 1431   BILITOT 0.5 02/09/2018 1431   GFRNONAA >60 02/09/2018 1431   GFRAA >60 02/09/2018 1431       Component Value Date/Time   WBC 2.8 (L) 02/09/2018 1431   WBC 8.8 12/18/2017 0623   RBC 2.81 (L) 02/09/2018 1431   HGB 8.1 (L) 02/09/2018 1431   HCT 24.6 (L) 02/09/2018 1431   PLT 113 (L) 02/09/2018 1431   MCV 87.5 02/09/2018 1431   MCH 28.8 02/09/2018 1431   MCHC 32.9 02/09/2018 1431   RDW 16.5 (H) 02/09/2018 1431   LYMPHSABS 0.2 (L) 02/09/2018 1431   MONOABS 0.6 02/09/2018 1431   EOSABS 0.0 02/09/2018 1431   BASOSABS 0.0 02/09/2018 1431   -------------------------------  Imaging from last 24 hours (if applicable):  Radiology interpretation: Ct Chest W Contrast  Result Date: 01/20/2018 CLINICAL DATA:  84 year old male with history of right-sided lung cancer diagnosed in November 2019 treated with chemotherapy and radiation therapy. 13 pound weight loss over the past 2 months. Dyspnea on exertion. EXAM: CT CHEST WITH CONTRAST TECHNIQUE: Multidetector CT imaging of the chest was performed during intravenous contrast administration. CONTRAST:  16mL OMNIPAQUE IOHEXOL 300 MG/ML  SOLN COMPARISON:  Chest CT 12/16/2017. FINDINGS: Cardiovascular: Heart size is normal. Small amount of pericardial fluid and/or thickening, similar to the prior study and unlikely to be of hemodynamic significance at this time. No associated pericardial calcification. There is aortic  atherosclerosis, as well as atherosclerosis of the great vessels of the mediastinum and the coronary arteries, including calcified atherosclerotic plaque in the left main, left anterior descending, left circumflex and right coronary arteries. In addition, there is aneurysmal dilatation of the ascending thoracic aorta which measures up to 4.6 cm in diameter. Moderate calcifications of the aortic valve. Left-sided internal jugular single-lumen porta cath with tip terminating at the superior cavoatrial junction. Mediastinum/Nodes: There continues to be extensive right hilar and mediastinal lymphadenopathy, resulting in a confluent soft tissue mass extending from the superior mediastinum around the origin of the right common carotid artery and innominate artery down to the level of the subcarinal region, involving all intervening nodal stations and extending into the right hilar region. This area is very irregular in shape and therefore difficult to accurately measure, however, the mass has clearly decreased in size compared to the prior study. This is best demonstrated in a relatively avascular region of the mass which currently measures approximately 5.2 x 3.4 cm (axial image 37 of series 2) as compared with 5.8 x 4.8 cm when measured in a similar fashion in the same region on axial image 14 of series 2 of prior study 12/16/2017. This mass continues to exert local mass effect upon adjacent structures causing moderate to severe narrowing of the right internal jugular vein. Superior vena cava remains widely patent at this time. Esophagus is unremarkable in appearance. No axillary lymphadenopathy. Lungs/Pleura: Continued decrease in  size of the primary right upper lobe lesion which currently measures 1.6 x 1.7 cm (axial image 26 of series 5). There are new patchy multifocal areas of peribronchovascular ground-glass attenuation scattered throughout the posterior aspect of the right upper lobe adjacent to the lesion and  more laterally, presumably reflective of evolving postradiation changes. No new suspicious appearing pulmonary nodules or masses are noted. Trace right pleural effusion lying dependently, decreased compared to the prior study. No left pleural effusion. Upper Abdomen: Aortic atherosclerosis. Musculoskeletal: Old chronic L1 compression fracture with 60% loss of anterior vertebral body height. There are no aggressive appearing lytic or blastic lesions noted in the visualized portions of the skeleton. IMPRESSION: 1. Today's study demonstrates a positive response to therapy with decreased size of the primary right upper lobe lesion as well as the right hilar and mediastinal nodal mass, as detailed above. 2. Decreasing trace right pleural effusion. 3. Evolving postradiation changes in the right upper lobe, as above. 4. Aortic atherosclerosis, in addition to left main and 3 vessel coronary artery disease. 5. In addition, there is aneurysmal dilatation of the ascending thoracic aorta which measures 4.6 cm in diameter. Ascending thoracic aortic aneurysm. Recommend semi-annual imaging followup by CTA or MRA and referral to cardiothoracic surgery if not already obtained. This recommendation follows 2010 ACCF/AHA/AATS/ACR/ASA/SCA/SCAI/SIR/STS/SVM Guidelines for the Diagnosis and Management of Patients With Thoracic Aortic Disease. Circulation. 2010; 121: O060-O459. Aortic Atherosclerosis (ICD10-I70.0). Electronically Signed   By: Vinnie Langton M.D.   On: 01/20/2018 09:44        This case was discussed with Dr. Julien Nordmann. He expressed agreement with my management of this patient.

## 2018-02-10 NOTE — Progress Notes (Unsigned)
Per Dr. Julien Nordmann, ok to proceed with treatment with current urinary output of 150 mL.

## 2018-02-10 NOTE — Patient Instructions (Signed)
[facility] Discharge Instructions for Patients Receiving Chemotherapy  Today you received the following chemotherapy agents:  Etoposide and Cisplatin.  To help prevent nausea and vomiting after your treatment, we encourage you to take your nausea medication as directed.   If you develop nausea and vomiting that is not controlled by your nausea medication, call the clinic.   BELOW ARE SYMPTOMS THAT SHOULD BE REPORTED IMMEDIATELY:  *FEVER GREATER THAN 100.5 F  *CHILLS WITH OR WITHOUT FEVER  NAUSEA AND VOMITING THAT IS NOT CONTROLLED WITH YOUR NAUSEA MEDICATION  *UNUSUAL SHORTNESS OF BREATH  *UNUSUAL BRUISING OR BLEEDING  TENDERNESS IN MOUTH AND THROAT WITH OR WITHOUT PRESENCE OF ULCERS  *URINARY PROBLEMS  *BOWEL PROBLEMS  UNUSUAL RASH Items with * indicate a potential emergency and should be followed up as soon as possible.  Feel free to call the clinic should you have any questions or concerns. The clinic phone number is [phone].  Please show the [place] at check-in to the Emergency Department and triage nurse.

## 2018-02-10 NOTE — Progress Notes (Incomplete)
Per Dr. Julien Nordmann, ok to proceed with treatment with current urinary output of 150 mL. At 1545, notified Dr. Julien Nordmann of limited

## 2018-02-10 NOTE — Progress Notes (Signed)
MD would like to increase the potassium chloride to 40 Meq and 20 Meq (2.5gm) in hydration fluids due to electrolyte results from 02/09/18.  B. Corey Skains, PharmD, BCPS, BCOP

## 2018-02-10 NOTE — Progress Notes (Signed)
Nutrition Follow-up:  Patient with small cell lung cancer followed by Dr. Earlie Server.    Met with patient and wife during infusion this am.  Wife reports that patient can't eat or drink anything as it comes right back up. Reports that they have talked with MD regarding feeding tube placement and decision was made that patient is not strong enough for feeding tube placement per wife.  Patient tearful during visit.     Medications: reviewed  Labs: reviewed  Anthropometrics:   Weight 173 lb 14.4 oz today decreased from 187 .7 lb on Jan 7.  8% weight loss in the last 21 days, significant   NUTRITION DIAGNOSIS: Unintentional weight loss continues   INTERVENTION:  Offered empathy and support to patient and wife.   Encouraged patient every bite counts.      MONITORING, EVALUATION, GOAL: plan of care, weight trends, intake   NEXT VISIT: Feb 18 during infusion  Tenesha Garza B. Zenia Resides, [place], [place] Registered Dietitian [phone] (pager)

## 2018-02-10 NOTE — Telephone Encounter (Signed)
appts already scheduled per 1/27 los.

## 2018-02-11 ENCOUNTER — Ambulatory Visit
Admission: RE | Admit: 2018-02-11 | Discharge: 2018-02-11 | Disposition: A | Discharge status: Home / Self Care | Payer: Medicare Other | Source: Ambulatory Visit | Attending: Radiation Oncology | Admitting: Radiation Oncology

## 2018-02-11 ENCOUNTER — Inpatient Hospital Stay: Payer: Medicare Other

## 2018-02-11 VITALS — BP 140/69 | HR 67 | Temp 97.5°F | Resp 14

## 2018-02-11 DIAGNOSIS — C3411 Malignant neoplasm of upper lobe, right bronchus or lung: Secondary | ICD-10-CM

## 2018-02-11 DIAGNOSIS — Z5111 Encounter for antineoplastic chemotherapy: Secondary | ICD-10-CM | POA: Diagnosis not present

## 2018-02-11 DIAGNOSIS — Z510 Encounter for antineoplastic radiation therapy: Secondary | ICD-10-CM | POA: Diagnosis not present

## 2018-02-11 DIAGNOSIS — D649 Anemia, unspecified: Secondary | ICD-10-CM

## 2018-02-11 LAB — PREPARE RBC (CROSSMATCH)

## 2018-02-11 MED ORDER — DIPHENHYDRAMINE HCL 25 MG PO CAPS
25.0000 mg | ORAL_CAPSULE | Freq: Once | ORAL | Status: AC
  Administered 2018-02-11: 25 mg via ORAL

## 2018-02-11 MED ORDER — DIPHENHYDRAMINE HCL 50 MG/ML IJ SOLN: 25.0000 mg | Freq: Once | INTRAMUSCULAR | Status: DC

## 2018-02-11 MED ORDER — SODIUM CHLORIDE 0.9% FLUSH
10.0000 mL | INTRAVENOUS | Status: AC | PRN
  Administered 2018-02-11: 10 mL
  Filled 2018-02-11: qty 10

## 2018-02-11 MED ORDER — ACETAMINOPHEN 325 MG PO TABS
ORAL_TABLET | ORAL | Status: AC
  Filled 2018-02-11: qty 2

## 2018-02-11 MED ORDER — HEPARIN SOD (PORK) LOCK FLUSH 100 UNIT/ML IV SOLN
250.0000 [IU] | INTRAVENOUS | Status: DC | PRN
  Filled 2018-02-11: qty 5

## 2018-02-11 MED ORDER — SODIUM CHLORIDE 0.9 % IV SOLN
Freq: Once | INTRAVENOUS | Status: AC
  Administered 2018-02-11: 14:00:00 via INTRAVENOUS
  Filled 2018-02-11: qty 250

## 2018-02-11 MED ORDER — HEPARIN SOD (PORK) LOCK FLUSH 100 UNIT/ML IV SOLN
500.0000 [IU] | Freq: Every day | INTRAVENOUS | Status: AC | PRN
  Administered 2018-02-11: 500 [IU]
  Filled 2018-02-11: qty 5

## 2018-02-11 MED ORDER — ACETAMINOPHEN 325 MG PO TABS
650.0000 mg | ORAL_TABLET | Freq: Once | ORAL | Status: AC
  Administered 2018-02-11: 650 mg via ORAL

## 2018-02-11 MED ORDER — FAMOTIDINE IN NACL 20-0.9 MG/50ML-% IV SOLN
20.0000 mg | Freq: Once | INTRAVENOUS | Status: AC
  Administered 2018-02-11: 20 mg via INTRAVENOUS

## 2018-02-11 MED ORDER — LORAZEPAM 2 MG/ML IJ SOLN: 0.5000 mg | Freq: Once | INTRAMUSCULAR | Status: DC

## 2018-02-11 MED ORDER — FAMOTIDINE IN NACL 20-0.9 MG/50ML-% IV SOLN
INTRAVENOUS | Status: AC
  Filled 2018-02-11: qty 50

## 2018-02-11 MED ORDER — SODIUM CHLORIDE 0.9% FLUSH
10.0000 mL | INTRAVENOUS | Status: DC | PRN
  Administered 2018-02-11: 10 mL
  Filled 2018-02-11: qty 10

## 2018-02-11 MED ORDER — HEPARIN SOD (PORK) LOCK FLUSH 100 UNIT/ML IV SOLN
500.0000 [IU] | Freq: Once | INTRAVENOUS | Status: DC | PRN
  Filled 2018-02-11: qty 5

## 2018-02-11 MED ORDER — SODIUM CHLORIDE 0.9 % IV SOLN
80.0000 mg/m2 | Freq: Once | INTRAVENOUS | Status: AC
  Administered 2018-02-11: 170 mg via INTRAVENOUS
  Filled 2018-02-11: qty 8.5

## 2018-02-11 MED ORDER — DEXAMETHASONE SODIUM PHOSPHATE 10 MG/ML IJ SOLN
INTRAMUSCULAR | Status: AC
  Filled 2018-02-11: qty 1

## 2018-02-11 MED ORDER — DEXAMETHASONE SODIUM PHOSPHATE 10 MG/ML IJ SOLN
10.0000 mg | Freq: Once | INTRAMUSCULAR | Status: AC
  Administered 2018-02-11: 10 mg via INTRAVENOUS

## 2018-02-11 MED ORDER — DIPHENHYDRAMINE HCL 25 MG PO CAPS
ORAL_CAPSULE | ORAL | Status: AC
  Filled 2018-02-11: qty 1

## 2018-02-11 NOTE — Progress Notes (Signed)
Per Dr. Julien Nordmann, ok to tx today. Pt lethargic and respirations slow, holding ativan prior to tx.

## 2018-02-11 NOTE — Patient Instructions (Signed)
[facility] Discharge Instructions for Patients Receiving Chemotherapy  Today you received the following chemotherapy agents:  Etoposide   To help prevent nausea and vomiting after your treatment, we encourage you to take your nausea medication as directed.   If you develop nausea and vomiting that is not controlled by your nausea medication, call the clinic.   BELOW ARE SYMPTOMS THAT SHOULD BE REPORTED IMMEDIATELY:  *FEVER GREATER THAN 100.5 F  *CHILLS WITH OR WITHOUT FEVER  NAUSEA AND VOMITING THAT IS NOT CONTROLLED WITH YOUR NAUSEA MEDICATION  *UNUSUAL SHORTNESS OF BREATH  *UNUSUAL BRUISING OR BLEEDING  TENDERNESS IN MOUTH AND THROAT WITH OR WITHOUT PRESENCE OF ULCERS  *URINARY PROBLEMS  *BOWEL PROBLEMS  UNUSUAL RASH Items with * indicate a potential emergency and should be followed up as soon as possible.  Feel free to call the clinic should you have any questions or concerns. The clinic phone number is [phone].  Please show the [place] at check-in to the Emergency Department and triage nurse.   Blood Transfusion, Adult, Care After This sheet gives you information about how to care for yourself after your procedure. Your doctor may also give you more specific instructions. If you have problems or questions, contact your doctor. Follow these instructions at home:   Take over-the-counter and prescription medicines only as told by your doctor.  Go back to your normal activities as told by your doctor.  Follow instructions from your doctor about how to take care of the area where an IV tube was put into your vein (insertion site). Make sure you: ? Wash your hands with soap and water before you change your bandage (dressing). If there is no soap and water, use hand sanitizer. ? Change your bandage as told by your doctor.  Check your IV insertion site every day for signs of infection. Check for: ? More redness, swelling, or pain. ? More  fluid or blood. ? Warmth. ? Pus or a bad smell. Contact a doctor if:  You have more redness, swelling, or pain around the IV insertion site.  You have more fluid or blood coming from the IV insertion site.  Your IV insertion site feels warm to the touch.  You have pus or a bad smell coming from the IV insertion site.  Your pee (urine) turns pink, red, or brown.  You feel weak after doing your normal activities. Get help right away if:  You have signs of a serious allergic or body defense (immune) system reaction, including: ? Itchiness. ? Hives. ? Trouble breathing. ? Anxiety. ? Pain in your chest or lower back. ? Fever, flushing, and chills. ? Fast pulse. ? Rash. ? Watery poop (diarrhea). ? Throwing up (vomiting). ? Dark pee. ? Serious headache. ? Dizziness. ? Stiff neck. ? Yellow color in your face or the white parts of your eyes (jaundice). Summary  After a blood transfusion, return to your normal activities as told by your doctor.  Every day, check for signs of infection where the IV tube was put into your vein.  Some signs of infection are warm skin, more redness and pain, more fluid or blood, and pus or a bad smell where the needle went in.  Contact your doctor if you feel weak or have any unusual symptoms. This information is not intended to replace advice given to you by your health care provider. Make sure you discuss any questions you have with your health care provider. Document Released: 01/21/2014 Document  Revised: 08/25/2015 Document Reviewed: 08/25/2015 Elsevier Interactive Patient Education  [employer].

## 2018-02-12 ENCOUNTER — Ambulatory Visit (HOSPITAL_COMMUNITY)
Admission: RE | Admit: 2018-02-12 | Discharge: 2018-02-12 | Disposition: A | Discharge status: Home / Self Care | Payer: Medicare Other | Source: Ambulatory Visit | Attending: Medical | Admitting: Medical

## 2018-02-12 ENCOUNTER — Inpatient Hospital Stay (HOSPITAL_BASED_OUTPATIENT_CLINIC_OR_DEPARTMENT_OTHER): Payer: Medicare Other | Admitting: Medical

## 2018-02-12 ENCOUNTER — Ambulatory Visit
Admission: RE | Admit: 2018-02-12 | Discharge: 2018-02-12 | Disposition: A | Discharge status: Home / Self Care | Payer: Medicare Other | Source: Ambulatory Visit | Attending: Radiation Oncology | Admitting: Radiation Oncology

## 2018-02-12 ENCOUNTER — Ambulatory Visit: Payer: Medicare Other

## 2018-02-12 ENCOUNTER — Inpatient Hospital Stay: Payer: Medicare Other | Admitting: Medical

## 2018-02-12 ENCOUNTER — Inpatient Hospital Stay: Payer: Medicare Other

## 2018-02-12 ENCOUNTER — Other Ambulatory Visit: Payer: Self-pay | Admitting: Medical

## 2018-02-12 VITALS — BP 163/91 | HR 70 | Temp 97.6°F | Resp 16

## 2018-02-12 DIAGNOSIS — R530 Neoplastic (malignant) related fatigue: Secondary | ICD-10-CM

## 2018-02-12 DIAGNOSIS — R5383 Other fatigue: Secondary | ICD-10-CM | POA: Diagnosis not present

## 2018-02-12 DIAGNOSIS — R6883 Chills (without fever): Secondary | ICD-10-CM | POA: Diagnosis present

## 2018-02-12 DIAGNOSIS — R1310 Dysphagia, unspecified: Secondary | ICD-10-CM | POA: Diagnosis not present

## 2018-02-12 DIAGNOSIS — C3490 Malignant neoplasm of unspecified part of unspecified bronchus or lung: Secondary | ICD-10-CM | POA: Diagnosis not present

## 2018-02-12 DIAGNOSIS — Z510 Encounter for antineoplastic radiation therapy: Secondary | ICD-10-CM | POA: Diagnosis not present

## 2018-02-12 DIAGNOSIS — C3491 Malignant neoplasm of unspecified part of right bronchus or lung: Secondary | ICD-10-CM

## 2018-02-12 DIAGNOSIS — Z5111 Encounter for antineoplastic chemotherapy: Secondary | ICD-10-CM | POA: Diagnosis not present

## 2018-02-12 DIAGNOSIS — R1319 Other dysphagia: Secondary | ICD-10-CM

## 2018-02-12 DIAGNOSIS — R531 Weakness: Secondary | ICD-10-CM | POA: Diagnosis not present

## 2018-02-12 DIAGNOSIS — C3411 Malignant neoplasm of upper lobe, right bronchus or lung: Secondary | ICD-10-CM

## 2018-02-12 DIAGNOSIS — Z95828 Presence of other vascular implants and grafts: Secondary | ICD-10-CM

## 2018-02-12 LAB — CBC WITH DIFFERENTIAL (CANCER CENTER ONLY)
Abs Immature Granulocytes: 0.08 10*3/uL — ABNORMAL HIGH (ref 0.00–0.07)
BASOS ABS: 0 10*3/uL (ref 0.0–0.1)
Basophils Relative: 0 %
Eosinophils Absolute: 0 10*3/uL (ref 0.0–0.5)
Eosinophils Relative: 0 %
HEMATOCRIT: 31 % — AB (ref 39.0–52.0)
HEMOGLOBIN: 10.2 g/dL — AB (ref 13.0–17.0)
Immature Granulocytes: 2 %
Lymphocytes Relative: 4 %
Lymphs Abs: 0.2 10*3/uL — ABNORMAL LOW (ref 0.7–4.0)
MCH: 29 pg (ref 26.0–34.0)
MCHC: 32.9 g/dL (ref 30.0–36.0)
MCV: 88.1 fL (ref 80.0–100.0)
Monocytes Absolute: 0.4 10*3/uL (ref 0.1–1.0)
Monocytes Relative: 7 %
Neutro Abs: 4.4 10*3/uL (ref 1.7–7.7)
Neutrophils Relative %: 87 %
Platelet Count: 162 10*3/uL (ref 150–400)
RBC: 3.52 MIL/uL — ABNORMAL LOW (ref 4.22–5.81)
RDW: 17.2 % — ABNORMAL HIGH (ref 11.5–15.5)
WBC Count: 5.1 10*3/uL (ref 4.0–10.5)
nRBC: 0 % (ref 0.0–0.2)

## 2018-02-12 LAB — BPAM RBC
Blood Product Expiration Date: 202003012359
Blood Product Expiration Date: 202003012359
ISSUE DATE / TIME: 202001290930
ISSUE DATE / TIME: 202001290930
UNIT TYPE AND RH: 5100
Unit Type and Rh: 5100

## 2018-02-12 LAB — TYPE AND SCREEN
ABO/RH(D): O POS
ANTIBODY SCREEN: NEGATIVE
Unit division: 0
Unit division: 0

## 2018-02-12 LAB — CMP (CANCER CENTER ONLY)
ALT: 7 U/L (ref 0–44)
AST: 12 U/L — ABNORMAL LOW (ref 15–41)
Albumin: 3.1 g/dL — ABNORMAL LOW (ref 3.5–5.0)
Alkaline Phosphatase: 60 U/L (ref 38–126)
Anion gap: 8 (ref 5–15)
BUN: 22 mg/dL (ref 8–23)
CHLORIDE: 104 mmol/L (ref 98–111)
CO2: 24 mmol/L (ref 22–32)
Calcium: 8.6 mg/dL — ABNORMAL LOW (ref 8.9–10.3)
Creatinine: 0.98 mg/dL (ref 0.61–1.24)
GFR, Est AFR Am: >60 mL/min (ref >60)
GFR, Estimated: >60 mL/min (ref >60)
Glucose, Bld: 109 mg/dL — ABNORMAL HIGH (ref 70–99)
Potassium: 3.7 mmol/L (ref 3.5–5.1)
Sodium: 136 mmol/L (ref 135–145)
Total Bilirubin: 0.5 mg/dL (ref 0.3–1.2)
Total Protein: 5.7 g/dL — ABNORMAL LOW (ref 6.5–8.1)

## 2018-02-12 LAB — MAGNESIUM: Magnesium: 1.8 mg/dL (ref 1.7–2.4)

## 2018-02-12 MED ORDER — ALUM & MAG HYDROXIDE-SIMETH 200-200-20 MG/5ML PO SUSP
30.0000 mL | Freq: Once | ORAL | Status: AC
  Administered 2018-02-12: 30 mL via ORAL

## 2018-02-12 MED ORDER — SODIUM CHLORIDE 0.9 % IV SOLN
INTRAVENOUS | Status: DC
  Administered 2018-02-12: 09:00:00 via INTRAVENOUS
  Filled 2018-02-12 (×2): qty 250

## 2018-02-12 MED ORDER — LIDOCAINE VISCOUS HCL 2 % MT SOLN
15.0000 mL | Freq: Once | OROMUCOSAL | Status: AC
  Administered 2018-02-12: 15 mL via ORAL
  Filled 2018-02-12: qty 15

## 2018-02-12 MED ORDER — HEPARIN SOD (PORK) LOCK FLUSH 100 UNIT/ML IV SOLN
500.0000 [IU] | Freq: Once | INTRAVENOUS | Status: AC | PRN
  Administered 2018-02-12: 500 [IU]
  Filled 2018-02-12: qty 5

## 2018-02-12 MED ORDER — ONDANSETRON HCL 4 MG/2ML IJ SOLN
INTRAMUSCULAR | Status: AC
  Filled 2018-02-12: qty 2

## 2018-02-12 MED ORDER — ALUM & MAG HYDROXIDE-SIMETH 200-200-20 MG/5ML PO SUSP
ORAL | Status: AC
  Filled 2018-02-12: qty 30

## 2018-02-12 MED ORDER — SODIUM CHLORIDE 0.9% FLUSH
10.0000 mL | INTRAVENOUS | Status: DC | PRN
  Administered 2018-02-12: 10 mL
  Filled 2018-02-12: qty 10

## 2018-02-12 MED ORDER — ONDANSETRON HCL 4 MG/2ML IJ SOLN
INTRAMUSCULAR | Status: AC
  Filled 2018-02-12: qty 4

## 2018-02-12 MED ORDER — ONDANSETRON HCL 4 MG/2ML IJ SOLN
8.0000 mg | Freq: Once | INTRAMUSCULAR | Status: AC
  Administered 2018-02-12: 8 mg via INTRAVENOUS

## 2018-02-12 NOTE — Progress Notes (Signed)
Patient states that he is not feeling well at all today. C/o nausea, chills, and "feeling bad". Dr. Grayland Ormond made aware and ordered normal saline 533ml over 1 hour and Zofran 8mg  IV. Per Dr. Grayland Ormond if patient still does not feel well after this it is ok to hold treatment today. Sandi Mealy, PA over to infusion to assess patient. Labs, chest x -ray and urine culture ordered. Patient and wife aware that after chest x-ray completed they will return to symptom management clinic to be reassessed by Sandi Mealy, PA. Patient to x-ray via wheelchair.

## 2018-02-12 NOTE — Patient Instructions (Signed)
Dehydration, Adult  Dehydration is when there is not enough fluid or water in your body. This happens when you lose more fluids than you take in. Dehydration can range from mild to very bad. It should be treated right away to keep it from getting very bad. Symptoms of mild dehydration may include:  Thirst.  Dry lips.  Slightly dry mouth.  Dry, warm skin.  Dizziness. Symptoms of moderate dehydration may include:  Very dry mouth.  Muscle cramps.  Dark pee (urine). Pee may be the color of tea.  Your body making less pee.  Your eyes making fewer tears.  Heartbeat that is uneven or faster than normal (palpitations).  Headache.  Light-headedness, especially when you stand up from sitting.  Fainting (syncope). Symptoms of very bad dehydration may include:  Changes in skin, such as: ? Cold and clammy skin. ? Blotchy (mottled) or pale skin. ? Skin that does not quickly return to normal after being lightly pinched and let go (poor skin turgor).  Changes in body fluids, such as: ? Feeling very thirsty. ? Your eyes making fewer tears. ? Not sweating when body temperature is high, such as in hot weather. ? Your body making very little pee.  Changes in vital signs, such as: ? Weak pulse. ? Pulse that is more than 100 beats a minute when you are sitting still. ? Fast breathing. ? Low blood pressure.  Other changes, such as: ? Sunken eyes. ? Cold hands and feet. ? Confusion. ? Lack of energy (lethargy). ? Trouble waking up from sleep. ? Short-term weight loss. ? Unconsciousness. Follow these instructions at home:   If told by your doctor, drink an ORS: ? Make an ORS by using instructions on the package. ? Start by drinking small amounts, about  cup (120 mL) every 5-10 minutes. ? Slowly drink more until you have had the amount that your doctor said to have.  Drink enough clear fluid to keep your pee clear or pale yellow. If you were told to drink an ORS, finish the  ORS first, then start slowly drinking clear fluids. Drink fluids such as: ? Water. Do not drink only water by itself. Doing that can make the salt (sodium) level in your body get too low (hyponatremia). ? Ice chips. ? Fruit juice that you have added water to (diluted). ? Low-calorie sports drinks.  Avoid: ? Alcohol. ? Drinks that have a lot of sugar. These include high-calorie sports drinks, fruit juice that does not have water added, and soda. ? Caffeine. ? Foods that are greasy or have a lot of fat or sugar.  Take over-the-counter and prescription medicines only as told by your doctor.  Do not take salt tablets. Doing that can make the salt level in your body get too high (hypernatremia).  Eat foods that have minerals (electrolytes). Examples include bananas, oranges, potatoes, tomatoes, and spinach.  Keep all follow-up visits as told by your doctor. This is important. Contact a doctor if:  You have belly (abdominal) pain that: ? Gets worse. ? Stays in one area (localizes).  You have a rash.  You have a stiff neck.  You get angry or annoyed more easily than normal (irritability).  You are more sleepy than normal.  You have a harder time waking up than normal.  You feel: ? Weak. ? Dizzy. ? Very thirsty.  You have peed (urinated) only a small amount of very dark pee during 6-8 hours. Get help right away if:  You have   symptoms of very bad dehydration.  You cannot drink fluids without throwing up (vomiting).  Your symptoms get worse with treatment.  You have a fever.  You have a very bad headache.  You are throwing up or having watery poop (diarrhea) and it: ? Gets worse. ? Does not go away.  You have blood or something green (bile) in your throw-up.  You have blood in your poop (stool). This may cause poop to look black and tarry.  You have not peed in 6-8 hours.  You pass out (faint).  Your heart rate when you are sitting still is more than 100 beats a  minute.  You have trouble breathing. This information is not intended to replace advice given to you by your health care provider. Make sure you discuss any questions you have with your health care provider. Document Released: 10/27/2008 Document Revised: 07/21/2015 Document Reviewed: 02/24/2015 Elsevier Interactive Patient Education  2019 [employer] Inc.  

## 2018-02-12 NOTE — Patient Instructions (Signed)
X-Rays  X-rays are pictures of the inside of the body. An X-ray machine creates these pictures using waves of energy called radiation. Bones and tissues in the body absorb different amounts of radiation, which show up on the X-ray pictures in shades of black, gray, and white. X-rays are used to check for many health conditions, including broken bones, lung problems, and some types of cancer. Tell a health care provider about:  Any allergies you have.  All medicines you are taking, including vitamins, herbs, eye drops, creams, and over-the-counter medicines.  Any surgeries you have had.  Any medical conditions you have.  Whether you are pregnant or may be pregnant. What are the risks? Generally, this is a safe procedure. However, being exposed to too much radiation over a lifetime can increase the risk of cancer. The risk from a single X-ray test is small. What happens before the procedure?  You may need to remove glasses, jewelry, and any other metal objects.  You will likely be asked to undress whatever part of your body needs the X-ray. If needed, you will be given a hospital gown to wear.  You may be asked to wear a protective lead apron to shield parts of your body from the X-ray. What happens during the procedure?   You will be asked to stay as still as possible during the exam in order to get the best possible images.  The X-ray machine will create a picture by using a tiny burst of radiation. This is painless.  You may need to have several pictures taken at different angles. The procedure may vary among health care providers and hospitals. What happens after the procedure?  You will be able to return to your normal activities.  The X-ray images will be examined by your health care provider or an X-ray (radiology) specialist.  It is up to you to get your test results. Ask your health care provider, or the department that is doing the test, when your results will be  ready. Summary  X-rays are pictures of the inside of the body. An X-ray machine creates these pictures using waves of energy called radiation.  Generally, this is a safe procedure. However, being exposed to too much radiation over a lifetime can increase the risk of cancer. The risk from a single X-ray test is small.  You will be asked to stay as still as possible during the exam in order to get the best possible images.  It is up to you to get your test results. Ask your health care provider, or the department that is doing the test, when your results will be ready. This information is not intended to replace advice given to you by your health care provider. Make sure you discuss any questions you have with your health care provider. Document Released: 12/31/2004 Document Revised: 11/14/2016 Document Reviewed: 11/14/2016 Elsevier Interactive Patient Education  2019 [employer].

## 2018-02-12 NOTE — Progress Notes (Signed)
Patient did not receive Etoposide treatment today.

## 2018-02-13 ENCOUNTER — Inpatient Hospital Stay: Payer: Medicare Other | Admitting: Medical

## 2018-02-13 ENCOUNTER — Ambulatory Visit
Admission: RE | Admit: 2018-02-13 | Discharge: 2018-02-13 | Disposition: A | Discharge status: Home / Self Care | Payer: Medicare Other | Source: Ambulatory Visit | Attending: Radiation Oncology | Admitting: Radiation Oncology

## 2018-02-13 VITALS — BP 159/84 | HR 61 | Temp 98.3°F | Resp 18 | Ht 68.0 in

## 2018-02-13 DIAGNOSIS — Z5111 Encounter for antineoplastic chemotherapy: Secondary | ICD-10-CM | POA: Diagnosis not present

## 2018-02-13 DIAGNOSIS — C3411 Malignant neoplasm of upper lobe, right bronchus or lung: Secondary | ICD-10-CM

## 2018-02-13 DIAGNOSIS — Z510 Encounter for antineoplastic radiation therapy: Secondary | ICD-10-CM | POA: Diagnosis not present

## 2018-02-13 DIAGNOSIS — E860 Dehydration: Secondary | ICD-10-CM

## 2018-02-13 MED ORDER — SILVER SULFADIAZINE 1 % EX CREA
TOPICAL_CREAM | Freq: Every day | CUTANEOUS | Status: DC
  Administered 2018-02-13: 15:00:00 via TOPICAL

## 2018-02-13 MED ORDER — PEGFILGRASTIM INJECTION 6 MG/0.6ML ~~LOC~~
PREFILLED_SYRINGE | SUBCUTANEOUS | Status: AC
  Filled 2018-02-13: qty 0.6

## 2018-02-13 MED ORDER — PEGFILGRASTIM INJECTION 6 MG/0.6ML ~~LOC~~
6.0000 mg | PREFILLED_SYRINGE | Freq: Once | SUBCUTANEOUS | Status: AC
  Administered 2018-02-13: 6 mg via SUBCUTANEOUS

## 2018-02-13 MED ORDER — SODIUM CHLORIDE 0.9 % IV SOLN
Freq: Once | INTRAVENOUS | Status: AC
  Administered 2018-02-13: 14:00:00 via INTRAVENOUS
  Filled 2018-02-13: qty 250

## 2018-02-13 MED ORDER — HEPARIN SOD (PORK) LOCK FLUSH 100 UNIT/ML IV SOLN
500.0000 [IU] | Freq: Once | INTRAVENOUS | Status: AC
  Administered 2018-02-13: 500 [IU] via INTRAVENOUS
  Filled 2018-02-13: qty 5

## 2018-02-13 MED ORDER — SODIUM CHLORIDE 0.9% FLUSH
10.0000 mL | Freq: Once | INTRAVENOUS | Status: AC
  Administered 2018-02-13: 10 mL
  Filled 2018-02-13: qty 10

## 2018-02-13 NOTE — Patient Instructions (Signed)

## 2018-02-13 NOTE — Progress Notes (Signed)
Pt given Neulasta shot in L arm per VO from [facility] despite not having chemo yesterday.  Pharmacy aware.  Pt normally gets onpro but verbalized understanding that this is the same medication but without the kit that stays on the body for a period of time, and therefore has the same side effects ,etc.  Verbalized understanding of d/c instructions to call as needed for questions or concerns.  Palliative care discussed with family and PA Lucianne Lei will be placing referral.

## 2018-02-14 ENCOUNTER — Other Ambulatory Visit: Payer: Self-pay | Admitting: Internal Medicine

## 2018-02-16 ENCOUNTER — Encounter: Payer: Self-pay | Admitting: Radiation Oncology

## 2018-02-16 ENCOUNTER — Telehealth: Payer: Self-pay | Admitting: *Deleted

## 2018-02-16 ENCOUNTER — Other Ambulatory Visit: Payer: Self-pay | Admitting: *Deleted

## 2018-02-16 ENCOUNTER — Ambulatory Visit
Admission: RE | Admit: 2018-02-16 | Discharge: 2018-02-16 | Disposition: A | Discharge status: Home / Self Care | Payer: Medicare Other | Source: Ambulatory Visit | Attending: Radiation Oncology | Admitting: Radiation Oncology

## 2018-02-16 ENCOUNTER — Inpatient Hospital Stay: Payer: Medicare Other | Attending: Internal Medicine

## 2018-02-16 DIAGNOSIS — D649 Anemia, unspecified: Secondary | ICD-10-CM | POA: Insufficient documentation

## 2018-02-16 DIAGNOSIS — C3411 Malignant neoplasm of upper lobe, right bronchus or lung: Secondary | ICD-10-CM | POA: Insufficient documentation

## 2018-02-16 DIAGNOSIS — Z510 Encounter for antineoplastic radiation therapy: Secondary | ICD-10-CM | POA: Insufficient documentation

## 2018-02-16 LAB — CBC WITH DIFFERENTIAL (CANCER CENTER ONLY)
Abs Immature Granulocytes: 0.2 10*3/uL — ABNORMAL HIGH (ref 0.00–0.07)
Band Neutrophils: 5 %
Basophils Absolute: 0.1 10*3/uL (ref 0.0–0.1)
Basophils Relative: 1 %
Eosinophils Absolute: 0 10*3/uL (ref 0.0–0.5)
Eosinophils Relative: 0 %
HCT: 30.3 % — ABNORMAL LOW (ref 39.0–52.0)
Hemoglobin: 10.3 g/dL — ABNORMAL LOW (ref 13.0–17.0)
Lymphocytes Relative: 4 %
Lymphs Abs: 0.4 10*3/uL — ABNORMAL LOW (ref 0.7–4.0)
MCH: 29.3 pg (ref 26.0–34.0)
MCHC: 34 g/dL (ref 30.0–36.0)
MCV: 86.1 fL (ref 80.0–100.0)
MONOS PCT: 2 %
Metamyelocytes Relative: 1 %
Monocytes Absolute: 0.2 10*3/uL (ref 0.1–1.0)
Myelocytes: 1 %
Neutro Abs: 9.8 10*3/uL (ref 1.7–17.7)
Neutrophils Relative %: 86 %
Platelet Count: 108 10*3/uL — ABNORMAL LOW (ref 150–400)
RBC: 3.52 MIL/uL — ABNORMAL LOW (ref 4.22–5.81)
RDW: 16.4 % — ABNORMAL HIGH (ref 11.5–15.5)
WBC Count: 10.8 10*3/uL — ABNORMAL HIGH (ref 4.0–10.5)
nRBC: 0 % (ref 0.0–0.2)

## 2018-02-16 LAB — CMP (CANCER CENTER ONLY)
ALT: 8 U/L (ref 0–44)
AST: 17 U/L (ref 15–41)
Albumin: 3.2 g/dL — ABNORMAL LOW (ref 3.5–5.0)
Alkaline Phosphatase: 91 U/L (ref 38–126)
Anion gap: 8 (ref 5–15)
BUN: 17 mg/dL (ref 8–23)
CO2: 31 mmol/L (ref 22–32)
Calcium: 8.4 mg/dL — ABNORMAL LOW (ref 8.9–10.3)
Chloride: 94 mmol/L — ABNORMAL LOW (ref 98–111)
Creatinine: 0.99 mg/dL (ref 0.61–1.24)
GFR, Est AFR Am: >60 mL/min (ref >60)
GFR, Estimated: >60 mL/min (ref >60)
Glucose, Bld: 116 mg/dL — ABNORMAL HIGH (ref 70–99)
Potassium: 2.9 mmol/L — CL (ref 3.5–5.1)
Sodium: 133 mmol/L — ABNORMAL LOW (ref 135–145)
Total Bilirubin: 0.7 mg/dL (ref 0.3–1.2)
Total Protein: 5.7 g/dL — ABNORMAL LOW (ref 6.5–8.1)

## 2018-02-16 LAB — MAGNESIUM: Magnesium: 1.4 mg/dL — CL (ref 1.7–2.4)

## 2018-02-16 MED ORDER — POTASSIUM CHLORIDE CRYS ER 20 MEQ PO TBCR: 20.0000 meq | EXTENDED_RELEASE_TABLET | Freq: Two times a day (BID) | ORAL | 0 refills | Status: AC

## 2018-02-16 NOTE — Progress Notes (Signed)
These preliminary result these preliminary results were noted.  Awaiting final report.

## 2018-02-16 NOTE — Telephone Encounter (Signed)
TCT patient's home to talk with patient or his wife.  K+ is 2.9 today.  Prescription for K+ called in to [facility]  20 meq  Twice a day for 10 days. Message left on identified phone # with above information.

## 2018-02-17 ENCOUNTER — Inpatient Hospital Stay: Payer: Medicare Other

## 2018-02-17 ENCOUNTER — Telehealth: Payer: Self-pay | Admitting: *Deleted

## 2018-02-17 LAB — CULTURE, BLOOD (SINGLE)
Culture: NO GROWTH
Culture: NO GROWTH
Special Requests: ADEQUATE

## 2018-02-17 NOTE — Telephone Encounter (Signed)
Done and in IN box 

## 2018-02-17 NOTE — Telephone Encounter (Signed)
Pt's wife notified order ready for pick up

## 2018-02-17 NOTE — Progress Notes (Signed)
Sympt[facility] Management Clinic Progress Note   ISAIC SYLER [id number] 04/01/1934 84 y.o.  Scott Gomez is managed by Dr. Fanny Bien. Mohamed  Actively treated with chemotherapy/immunotherapy/hormonal therapy: yes  Current Therapy: Cisplatin and etoposide with pegfilgrastim support and concurrent radiation therapy  Last Treated: 02/11/2018 (cycle 4, day 2)  Assessment: Plan:    Esophageal dysphagia - Plan: alum & mag hydroxide-simeth (MAALOX/MYLANTA) 200-200-20 MG/5ML suspension 30 mL, lidocaine (XYLOCAINE) 2 % viscous mouth solution 15 mL  Small cell lung cancer ([place])   Esophageal dysphasia: The patient was given a GI cocktail while he was here today and was given a prescription for viscous lidocaine.  The patient was given normal saline IV today.  I attempted to review with the patient and his wife what the expectations are for his recovery from radiation.  I repeatedly encouraged the patient to take small sips of liquids.  He was given multiple samples of supplements including Ensure clear and water.  He will return tomorrow for additional IV fluids.  I believe that with the patient refers to as "spitting up" is due to his gagging on his own secretions and not truly vomiting.  Small cell lung cancer: The patient is status post cycle 4, day 2 of cisplatin and etoposide which was dosed on 02/11/2018.  His treatment will be held today because of his report of ongoing weakness.  The patient is wife believes that his significant weakness and dysphasia today is related to the fact that he was given a boost of radiation today.  He will return for labs and to be seen by Dr. Earlie Server on 03/03/2018.  Please see After Visit Summary for patient specific instructions.  Future Appointments  Date Time Provider [place]  02/24/2018 11:30 AM [facility] LAB 3 [facility] None  02/25/2018 10:45 AM Rigoberto Noel, MD [facility] None  03/03/2018  8:00 AM [facility] LAB 4 [facility] None    03/03/2018  8:15 AM [facility] [place] FLUSH [facility] None  03/03/2018  8:30 AM Curt Bears, MD [facility] None  03/03/2018  9:00 AM [facility] INFUSION [facility] None  03/03/2018 11:15 AM Jennet Maduro, RD [facility] None  03/04/2018  9:30 AM [facility] INFUSION [facility] None  03/05/2018  9:15 AM [facility] INFUSION [facility] None  03/10/2018 11:30 AM [facility] LAB 2 [facility] None  03/17/2018 11:30 AM [facility] LAB 2 [facility] None  03/24/2018  8:00 AM [facility] LAB 2 [facility] None  03/24/2018  8:15 AM [facility] [place] FLUSH [facility] None  03/24/2018  8:45 AM Curt Bears, MD [facility] None  03/24/2018  9:00 AM [facility] INFUSION [facility] None  03/25/2018  9:30 AM [facility] INFUSION [facility] None  03/26/2018  9:00 AM [facility] INFUSION [facility] None  03/31/2018  1:30 PM Hayden Pedro, PA-C [facility] None  04/15/2018 10:00 AM Pinson, Venetia Maxon, LPN [facility]  9/0/3009  8:30 AM [name], [name], MD [facility]    No orders of the defined types were placed in this encounter.      Subjective:   Patient ID:  Scott Gomez is a 84 y.o. (DOB [birth year]) male.  Chief Complaint: No chief complaint on file.   HPI Scott Gomez  is an 84 year old male with a history of a small cell carcinoma of the right lung who is managed by Dr. Fanny Bien. Mohamed and is status post cycle cycle 4, day 2 of cisplatin and etoposide with pegfilgrastim support and concurrent radiation therapy.    He was last treated yesterday.  He was given a  radiation boost today.  He continues to have severe throat pain and is having difficulty swallowing.  He has difficulty swallowing medications and gets choked on his own saliva which causes him to "spit up."  He has significant fatigue.  He and his wife believe that his significant fatigue and continued swallowing difficulties are related to the boost of radiation that he received today.  He has 2 additional days of radiation by  his report.  Medications: I have reviewed the patient's current medications.  Allergies:  Allergies  Allergen Reactions  . Buspirone Hcl     REACTION: itching  . Crestor [Rosuvastatin Calcium]     Increased his blood sugar   . Prednisone     REACTION: sick, per wife he can take this.    Past Medical History:  Diagnosis Date  . Anxiety   . Arthritis    osteoarthritis both hands  . Carpal tunnel syndrome   . COPD (chronic obstructive pulmonary disease) ([place])   . CPAP (continuous positive airway pressure) dependence   . Diabetes mellitus    type II  . Fatigue   . History of kidney stones   . Hypercholesteremia   . Hypopotassemia   . Leg cramps   . Lung cancer ([place])   . Nephrolithiasis    Hx of  . Palpitations    Hx of   . Shoulder pain, left   . Snoring   . Testosterone deficiency   . Ulcer    peptic ulcer disease    Past Surgical History:  Procedure Laterality Date  . BACK SURGERY  1978   x2 disc  . IR IMAGING GUIDED PORT INSERTION  12/19/2017    Family History  Problem Relation Age of Onset  . Heart disease Brother        MI  . Kidney cancer Brother   . Hypertension Mother   . Allergies Mother   . Hypertension Father   . Allergies Father   . Diabetes Father   . Heart disease Father   . Breast cancer Sister   . Kidney cancer Sister   . Colon cancer Neg Hx   . Liver cancer Neg Hx   . AAA (abdominal aortic aneurysm) Neg Hx     Social History   Socioeconomic History  . Marital status: Married    Spouse name: Not on file  . Number of children: 3  . Years of education: Not on file  . Highest education level: Not on file  Occupational History  . Occupation: retired  Scientific laboratory technician  . Financial resource strain: Not on file  . Food insecurity:    Worry: Not on file    Inability: Not on file  . Transportation needs:    Medical: Not on file    Non-medical: Not on file  Tobacco Use  . Smoking status: Former Smoker    Packs/day: 2.00    Years:  38.00    Pack years: 76.00    Types: Cigarettes    Last attempt to quit: 01/14/1993    Years since quitting: 25.1  . Smokeless tobacco: Never Used  Substance and Sexual Activity  . Alcohol use: No    Alcohol/week: 0.0 standard drinks  . Drug use: No  . Sexual activity: Never  Lifestyle  . Physical activity:    Days per week: Not on file    Minutes per session: Not on file  . Stress: Not on file  Relationships  . Social connections:    Talks  on phone: Not on file    Gets together: Not on file    Attends religious service: Not on file    Active member of club or organization: Not on file    Attends meetings of clubs or organizations: Not on file    Relationship status: Not on file  . Intimate partner violence:    Fear of current or ex partner: Not on file    Emotionally abused: Not on file    Physically abused: Not on file    Forced sexual activity: Not on file  Other Topics Concern  . Not on file  Social History Narrative   Lives with wife Chevis Weisensel   Retired    Past Medical History, Surgical history, Social history, and Family history were reviewed and updated as appropriate.   Please see review of systems for further details on the patient's review from today.   Review of Systems:  Review of Systems  Constitutional: Positive for fatigue. Negative for chills, diaphoresis and fever.  HENT: Positive for sore throat and trouble swallowing. Negative for voice change.   Respiratory: Negative for cough, chest tightness, shortness of breath and wheezing.   Cardiovascular: Negative for chest pain and palpitations.  Gastrointestinal: Negative for abdominal pain, constipation, diarrhea, nausea and vomiting.  Musculoskeletal: Negative for back pain and myalgias.  Neurological: Negative for dizziness, light-headedness and headaches.    Objective:   Physical Exam:  There were no vitals taken for this visit. ECOG: 1  Physical Exam Constitutional:      General: He is  not in acute distress.    Appearance: He is not diaphoretic.  HENT:     Head: Normocephalic and atraumatic.  Cardiovascular:     Rate and Rhythm: Normal rate and regular rhythm.     Heart sounds: Normal heart sounds. No murmur. No friction rub. No gallop.   Pulmonary:     Effort: Pulmonary effort is normal. No respiratory distress.     Breath sounds: Normal breath sounds. No wheezing or rales.  Skin:    General: Skin is warm and dry.       Neurological:     Mental Status: He is alert.     Gait: Gait abnormal (The patient is ambulating with the use of a wheelchair.).     Lab Review:     Component Value Date/Time   NA 133 (L) 02/16/2018 1132   K 2.9 (LL) 02/16/2018 1132   CL 94 (L) 02/16/2018 1132   CO2 31 02/16/2018 1132   GLUCOSE 116 (H) 02/16/2018 1132   BUN 17 02/16/2018 1132   CREATININE 0.99 02/16/2018 1132   CALCIUM 8.4 (L) 02/16/2018 1132   PROT 5.7 (L) 02/16/2018 1132   ALBUMIN 3.2 (L) 02/16/2018 1132   AST 17 02/16/2018 1132   ALT 8 02/16/2018 1132   ALKPHOS 91 02/16/2018 1132   BILITOT 0.7 02/16/2018 1132   GFRNONAA >60 02/16/2018 1132   GFRAA >60 02/16/2018 1132       Component Value Date/Time   WBC 10.8 (H) 02/16/2018 1132   WBC 8.8 12/18/2017 0623   RBC 3.52 (L) 02/16/2018 1132   HGB 10.3 (L) 02/16/2018 1132   HCT 30.3 (L) 02/16/2018 1132   PLT 108 (L) 02/16/2018 1132   MCV 86.1 02/16/2018 1132   MCH 29.3 02/16/2018 1132   MCHC 34.0 02/16/2018 1132   RDW 16.4 (H) 02/16/2018 1132   LYMPHSABS 0.4 (L) 02/16/2018 1132   MONOABS 0.2 02/16/2018 1132   EOSABS 0.0  02/16/2018 1132   BASOSABS 0.1 02/16/2018 1132   -------------------------------  Imaging from last 24 hours (if applicable):  Radiology interpretation: Dg Chest 2 View  Result Date: 02/12/2018 CLINICAL DATA:  Weakness.  Small cell lung cancer EXAM: CHEST - 2 VIEW COMPARISON:  01/20/2018 chest CT FINDINGS: Borderline heart size. A small pericardial effusion with present by CT. Stable  mediastinal contours. Porta catheter from the left with tip at the upper cavoatrial junction. Hyperinflation. There is no edema, consolidation, effusion, or pneumothorax. Remote L1 compression fracture. IMPRESSION: No evidence of active disease. Electronically Signed   By: Monte Fantasia M.D.   On: 02/12/2018 11:23   Ct Chest W Contrast  Result Date: 01/20/2018 CLINICAL DATA:  84 year old male with history of right-sided lung cancer diagnosed in November 2019 treated with chemotherapy and radiation therapy. 13 pound weight loss over the past 2 months. Dyspnea on exertion. EXAM: CT CHEST WITH CONTRAST TECHNIQUE: Multidetector CT imaging of the chest was performed during intravenous contrast administration. CONTRAST:  60mL OMNIPAQUE IOHEXOL 300 MG/ML  SOLN COMPARISON:  Chest CT 12/16/2017. FINDINGS: Cardiovascular: Heart size is normal. Small amount of pericardial fluid and/or thickening, similar to the prior study and unlikely to be of hemodynamic significance at this time. No associated pericardial calcification. There is aortic atherosclerosis, as well as atherosclerosis of the great vessels of the mediastinum and the coronary arteries, including calcified atherosclerotic plaque in the left main, left anterior descending, left circumflex and right coronary arteries. In addition, there is aneurysmal dilatation of the ascending thoracic aorta which measures up to 4.6 cm in diameter. Moderate calcifications of the aortic valve. Left-sided internal jugular single-lumen porta cath with tip terminating at the superior cavoatrial junction. Mediastinum/Nodes: There continues to be extensive right hilar and mediastinal lymphadenopathy, resulting in a confluent soft tissue mass extending from the superior mediastinum around the origin of the right common carotid artery and innominate artery down to the level of the subcarinal region, involving all intervening nodal stations and extending into the right hilar region. This  area is very irregular in shape and therefore difficult to accurately measure, however, the mass has clearly decreased in size compared to the prior study. This is best demonstrated in a relatively avascular region of the mass which currently measures approximately 5.2 x 3.4 cm (axial image 37 of series 2) as compared with 5.8 x 4.8 cm when measured in a similar fashion in the same region on axial image 14 of series 2 of prior study 12/16/2017. This mass continues to exert local mass effect upon adjacent structures causing moderate to severe narrowing of the right internal jugular vein. Superior vena cava remains widely patent at this time. Esophagus is unremarkable in appearance. No axillary lymphadenopathy. Lungs/Pleura: Continued decrease in size of the primary right upper lobe lesion which currently measures 1.6 x 1.7 cm (axial image 26 of series 5). There are new patchy multifocal areas of peribronchovascular ground-glass attenuation scattered throughout the posterior aspect of the right upper lobe adjacent to the lesion and more laterally, presumably reflective of evolving postradiation changes. No new suspicious appearing pulmonary nodules or masses are noted. Trace right pleural effusion lying dependently, decreased compared to the prior study. No left pleural effusion. Upper Abdomen: Aortic atherosclerosis. Musculoskeletal: Old chronic L1 compression fracture with 60% loss of anterior vertebral body height. There are no aggressive appearing lytic or blastic lesions noted in the visualized portions of the skeleton. IMPRESSION: 1. Today's study demonstrates a positive response to therapy with decreased size of  the primary right upper lobe lesion as well as the right hilar and mediastinal nodal mass, as detailed above. 2. Decreasing trace right pleural effusion. 3. Evolving postradiation changes in the right upper lobe, as above. 4. Aortic atherosclerosis, in addition to left main and 3 vessel coronary artery  disease. 5. In addition, there is aneurysmal dilatation of the ascending thoracic aorta which measures 4.6 cm in diameter. Ascending thoracic aortic aneurysm. Recommend semi-annual imaging followup by CTA or MRA and referral to cardiothoracic surgery if not already obtained. This recommendation follows 2010 ACCF/AHA/AATS/ACR/ASA/SCA/SCAI/SIR/STS/SVM Guidelines for the Diagnosis and Management of Patients With Thoracic Aortic Disease. Circulation. 2010; 121: X094-M768. Aortic Atherosclerosis (ICD10-I70.0). Electronically Signed   By: Vinnie Langton M.D.   On: 01/20/2018 09:44        This case was discussed with Dr. Julien Nordmann. He expressed agreement with my management of this patient.

## 2018-02-17 NOTE — Telephone Encounter (Signed)
Spoke to pts wife who is requesting an order for a bedside commode. She states that the pt fell twice last night attempting to go to the restroom, but did not sustain any injuries and she doesn't feel he needs to be evaluated. pls advise

## 2018-02-18 NOTE — Progress Notes (Signed)
The patient was seen briefly today as he was receiving IV fluids.  He continues to have significant weakness and dysphasia.  He and his wife are agreeable to speak with palliative care regarding the difficulties he has been having with his treatments.  Sandi Mealy, MHS, PA-C Physician Assistant

## 2018-02-19 ENCOUNTER — Inpatient Hospital Stay (HOSPITAL_COMMUNITY)
Admission: EM | Admit: 2018-02-19 | Discharge: 2018-02-21 | DRG: 157 | Disposition: A | Discharge status: Home Health | Payer: Medicare Other | Attending: Family Medicine | Admitting: Family Medicine

## 2018-02-19 ENCOUNTER — Other Ambulatory Visit: Payer: Self-pay

## 2018-02-19 ENCOUNTER — Encounter (HOSPITAL_COMMUNITY): Payer: Self-pay

## 2018-02-19 ENCOUNTER — Telehealth: Payer: Self-pay | Admitting: Medical Oncology

## 2018-02-19 ENCOUNTER — Emergency Department (HOSPITAL_COMMUNITY): Payer: Medicare Other

## 2018-02-19 DIAGNOSIS — R350 Frequency of micturition: Secondary | ICD-10-CM | POA: Diagnosis present

## 2018-02-19 DIAGNOSIS — Z8249 Family history of ischemic heart disease and other diseases of the circulatory system: Secondary | ICD-10-CM

## 2018-02-19 DIAGNOSIS — Z803 Family history of malignant neoplasm of breast: Secondary | ICD-10-CM

## 2018-02-19 DIAGNOSIS — I712 Thoracic aortic aneurysm, without rupture: Secondary | ICD-10-CM | POA: Diagnosis present

## 2018-02-19 DIAGNOSIS — I4891 Unspecified atrial fibrillation: Secondary | ICD-10-CM | POA: Diagnosis not present

## 2018-02-19 DIAGNOSIS — C3490 Malignant neoplasm of unspecified part of unspecified bronchus or lung: Secondary | ICD-10-CM | POA: Diagnosis present

## 2018-02-19 DIAGNOSIS — I10 Essential (primary) hypertension: Secondary | ICD-10-CM | POA: Diagnosis present

## 2018-02-19 DIAGNOSIS — R600 Localized edema: Secondary | ICD-10-CM | POA: Diagnosis present

## 2018-02-19 DIAGNOSIS — R112 Nausea with vomiting, unspecified: Secondary | ICD-10-CM

## 2018-02-19 DIAGNOSIS — D61818 Other pancytopenia: Secondary | ICD-10-CM | POA: Diagnosis not present

## 2018-02-19 DIAGNOSIS — K1231 Oral mucositis (ulcerative) due to antineoplastic therapy: Secondary | ICD-10-CM | POA: Diagnosis present

## 2018-02-19 DIAGNOSIS — Z888 Allergy status to other drugs, medicaments and biological substances status: Secondary | ICD-10-CM

## 2018-02-19 DIAGNOSIS — Z8711 Personal history of peptic ulcer disease: Secondary | ICD-10-CM

## 2018-02-19 DIAGNOSIS — Z9119 Patient's noncompliance with other medical treatment and regimen: Secondary | ICD-10-CM | POA: Diagnosis not present

## 2018-02-19 DIAGNOSIS — M19041 Primary osteoarthritis, right hand: Secondary | ICD-10-CM | POA: Diagnosis present

## 2018-02-19 DIAGNOSIS — T451X5A Adverse effect of antineoplastic and immunosuppressive drugs, initial encounter: Secondary | ICD-10-CM | POA: Diagnosis present

## 2018-02-19 DIAGNOSIS — X58XXXA Exposure to other specified factors, initial encounter: Secondary | ICD-10-CM | POA: Diagnosis present

## 2018-02-19 DIAGNOSIS — K219 Gastro-esophageal reflux disease without esophagitis: Secondary | ICD-10-CM | POA: Diagnosis present

## 2018-02-19 DIAGNOSIS — K279 Peptic ulcer, site unspecified, unspecified as acute or chronic, without hemorrhage or perforation: Secondary | ICD-10-CM | POA: Diagnosis present

## 2018-02-19 DIAGNOSIS — E860 Dehydration: Secondary | ICD-10-CM | POA: Diagnosis present

## 2018-02-19 DIAGNOSIS — Z79891 Long term (current) use of opiate analgesic: Secondary | ICD-10-CM

## 2018-02-19 DIAGNOSIS — Z87891 Personal history of nicotine dependence: Secondary | ICD-10-CM

## 2018-02-19 DIAGNOSIS — E43 Unspecified severe protein-calorie malnutrition: Secondary | ICD-10-CM | POA: Diagnosis present

## 2018-02-19 DIAGNOSIS — G4733 Obstructive sleep apnea (adult) (pediatric): Secondary | ICD-10-CM | POA: Diagnosis present

## 2018-02-19 DIAGNOSIS — J449 Chronic obstructive pulmonary disease, unspecified: Secondary | ICD-10-CM | POA: Diagnosis present

## 2018-02-19 DIAGNOSIS — Z86718 Personal history of other venous thrombosis and embolism: Secondary | ICD-10-CM | POA: Diagnosis not present

## 2018-02-19 DIAGNOSIS — I480 Paroxysmal atrial fibrillation: Secondary | ICD-10-CM | POA: Diagnosis present

## 2018-02-19 DIAGNOSIS — R531 Weakness: Secondary | ICD-10-CM

## 2018-02-19 DIAGNOSIS — Z79899 Other long term (current) drug therapy: Secondary | ICD-10-CM

## 2018-02-19 DIAGNOSIS — Z66 Do not resuscitate: Secondary | ICD-10-CM | POA: Diagnosis present

## 2018-02-19 DIAGNOSIS — E119 Type 2 diabetes mellitus without complications: Secondary | ICD-10-CM | POA: Diagnosis present

## 2018-02-19 DIAGNOSIS — F419 Anxiety disorder, unspecified: Secondary | ICD-10-CM | POA: Diagnosis present

## 2018-02-19 DIAGNOSIS — Z923 Personal history of irradiation: Secondary | ICD-10-CM

## 2018-02-19 DIAGNOSIS — R627 Adult failure to thrive: Secondary | ICD-10-CM | POA: Diagnosis present

## 2018-02-19 DIAGNOSIS — Z87442 Personal history of urinary calculi: Secondary | ICD-10-CM | POA: Diagnosis not present

## 2018-02-19 DIAGNOSIS — M19042 Primary osteoarthritis, left hand: Secondary | ICD-10-CM | POA: Diagnosis present

## 2018-02-19 DIAGNOSIS — F329 Major depressive disorder, single episode, unspecified: Secondary | ICD-10-CM | POA: Diagnosis present

## 2018-02-19 DIAGNOSIS — E876 Hypokalemia: Secondary | ICD-10-CM | POA: Diagnosis present

## 2018-02-19 DIAGNOSIS — E7800 Pure hypercholesterolemia, unspecified: Secondary | ICD-10-CM | POA: Diagnosis present

## 2018-02-19 DIAGNOSIS — D61810 Antineoplastic chemotherapy induced pancytopenia: Secondary | ICD-10-CM | POA: Diagnosis present

## 2018-02-19 DIAGNOSIS — I361 Nonrheumatic tricuspid (valve) insufficiency: Secondary | ICD-10-CM | POA: Diagnosis not present

## 2018-02-19 DIAGNOSIS — Z8051 Family history of malignant neoplasm of kidney: Secondary | ICD-10-CM

## 2018-02-19 DIAGNOSIS — Z7901 Long term (current) use of anticoagulants: Secondary | ICD-10-CM

## 2018-02-19 DIAGNOSIS — Z833 Family history of diabetes mellitus: Secondary | ICD-10-CM

## 2018-02-19 LAB — LACTIC ACID, PLASMA
Lactic Acid, Venous: 1.1 mmol/L (ref 0.5–1.9)
Lactic Acid, Venous: 0.7 mmol/L (ref 0.5–1.9)

## 2018-02-19 LAB — URINALYSIS, ROUTINE W REFLEX MICROSCOPIC
Bacteria, UA: NONE SEEN
Bilirubin Urine: NEGATIVE
Glucose, UA: 50 mg/dL — AB
Hgb urine dipstick: NEGATIVE
Ketones, ur: 5 mg/dL — AB
Leukocytes, UA: NEGATIVE
Nitrite: NEGATIVE
PROTEIN: 30 mg/dL — AB
Specific Gravity, Urine: 1.011 (ref 1.005–1.030)
pH: 8 (ref 5.0–8.0)

## 2018-02-19 LAB — CBC WITH DIFFERENTIAL/PLATELET
ABS IMMATURE GRANULOCYTES: 0.01 10*3/uL (ref 0.00–0.07)
Basophils Absolute: 0 10*3/uL (ref 0.0–0.1)
Basophils Relative: 1 %
Eosinophils Absolute: 0 10*3/uL (ref 0.0–0.5)
Eosinophils Relative: 0 %
HCT: 27.6 % — ABNORMAL LOW (ref 39.0–52.0)
Hemoglobin: 8.9 g/dL — ABNORMAL LOW (ref 13.0–17.0)
Immature Granulocytes: 1 %
Lymphocytes Relative: 6 %
Lymphs Abs: 0.1 10*3/uL — ABNORMAL LOW (ref 0.7–4.0)
MCH: 28.2 pg (ref 26.0–34.0)
MCHC: 32.2 g/dL (ref 30.0–36.0)
MCV: 87.3 fL (ref 80.0–100.0)
Monocytes Absolute: 0.2 10*3/uL (ref 0.1–1.0)
Monocytes Relative: 16 %
NEUTROS PCT: 76 %
Neutro Abs: 1.1 10*3/uL — ABNORMAL LOW (ref 1.7–7.7)
Platelets: 38 10*3/uL — ABNORMAL LOW (ref 150–400)
RBC: 3.16 MIL/uL — AB (ref 4.22–5.81)
RDW: 16 % — ABNORMAL HIGH (ref 11.5–15.5)
WBC: 1.5 10*3/uL — ABNORMAL LOW (ref 4.0–10.5)
nRBC: 0 % (ref 0.0–0.2)

## 2018-02-19 LAB — COMPREHENSIVE METABOLIC PANEL
ALBUMIN: 3.4 g/dL — AB (ref 3.5–5.0)
ALT: 11 U/L (ref 0–44)
AST: 13 U/L — ABNORMAL LOW (ref 15–41)
Alkaline Phosphatase: 60 U/L (ref 38–126)
Anion gap: 10 (ref 5–15)
BUN: 14 mg/dL (ref 8–23)
CO2: 27 mmol/L (ref 22–32)
Calcium: 8.4 mg/dL — ABNORMAL LOW (ref 8.9–10.3)
Chloride: 95 mmol/L — ABNORMAL LOW (ref 98–111)
Creatinine, Ser: 0.89 mg/dL (ref 0.61–1.24)
GFR calc Af Amer: >60 mL/min (ref >60)
GFR calc non Af Amer: >60 mL/min (ref >60)
Glucose, Bld: 141 mg/dL — ABNORMAL HIGH (ref 70–99)
Potassium: 2.9 mmol/L — ABNORMAL LOW (ref 3.5–5.1)
Sodium: 132 mmol/L — ABNORMAL LOW (ref 135–145)
Total Bilirubin: 0.9 mg/dL (ref 0.3–1.2)
Total Protein: 5.9 g/dL — ABNORMAL LOW (ref 6.5–8.1)

## 2018-02-19 LAB — MAGNESIUM: Magnesium: 1.5 mg/dL — ABNORMAL LOW (ref 1.7–2.4)

## 2018-02-19 LAB — GLUCOSE, CAPILLARY: Glucose-Capillary: 88 mg/dL (ref 70–99)

## 2018-02-19 LAB — PHOSPHORUS: Phosphorus: 2.3 mg/dL — ABNORMAL LOW (ref 2.5–4.6)

## 2018-02-19 LAB — PROTIME-INR
INR: 1.25
Prothrombin Time: 15.6 seconds — ABNORMAL HIGH (ref 11.4–15.2)

## 2018-02-19 MED ORDER — BARRIER CREAM NON-SPECIFIED
1.0000 "application " | TOPICAL_CREAM | Freq: Two times a day (BID) | TOPICAL | Status: DC
  Filled 2018-02-19: qty 1

## 2018-02-19 MED ORDER — LIDOCAINE-PRILOCAINE 2.5-2.5 % EX CREA
TOPICAL_CREAM | CUTANEOUS | Status: DC | PRN
  Filled 2018-02-19: qty 5

## 2018-02-19 MED ORDER — POTASSIUM PHOSPHATE MONOBASIC 500 MG PO TABS
1000.0000 mg | ORAL_TABLET | Freq: Once | ORAL | Status: AC
  Administered 2018-02-19: 1000 mg via ORAL
  Filled 2018-02-19: qty 2

## 2018-02-19 MED ORDER — ONDANSETRON HCL 4 MG/2ML IJ SOLN
4.0000 mg | Freq: Four times a day (QID) | INTRAMUSCULAR | Status: DC | PRN
  Administered 2018-02-19 – 2018-02-21 (×5): 4 mg via INTRAVENOUS
  Filled 2018-02-19 (×5): qty 2

## 2018-02-19 MED ORDER — SUCRALFATE 1 G PO TABS
1.0000 g | ORAL_TABLET | Freq: Three times a day (TID) | ORAL | Status: DC
  Administered 2018-02-19: 1 g via ORAL
  Filled 2018-02-19 (×2): qty 1

## 2018-02-19 MED ORDER — ONDANSETRON HCL 4 MG PO TABS: 8.0000 mg | ORAL_TABLET | Freq: Three times a day (TID) | ORAL | Status: DC | PRN

## 2018-02-19 MED ORDER — ACETAMINOPHEN 650 MG RE SUPP: 650.0000 mg | Freq: Four times a day (QID) | RECTAL | Status: DC | PRN

## 2018-02-19 MED ORDER — PANTOPRAZOLE SODIUM 40 MG PO TBEC
40.0000 mg | DELAYED_RELEASE_TABLET | Freq: Two times a day (BID) | ORAL | Status: DC
  Administered 2018-02-19 – 2018-02-21 (×4): 40 mg via ORAL
  Filled 2018-02-19 (×4): qty 1

## 2018-02-19 MED ORDER — ZINC OXIDE 11.3 % EX CREA
TOPICAL_CREAM | Freq: Two times a day (BID) | CUTANEOUS | Status: DC
  Administered 2018-02-19: 1 via TOPICAL
  Administered 2018-02-20 – 2018-02-21 (×2): via TOPICAL
  Filled 2018-02-19: qty 56

## 2018-02-19 MED ORDER — DILTIAZEM HCL 30 MG PO TABS: 15.0000 mg | ORAL_TABLET | Freq: Four times a day (QID) | ORAL | Status: DC

## 2018-02-19 MED ORDER — SODIUM CHLORIDE 0.9% FLUSH: 3.0000 mL | Freq: Two times a day (BID) | INTRAVENOUS | Status: DC

## 2018-02-19 MED ORDER — GABAPENTIN 100 MG PO CAPS
200.0000 mg | ORAL_CAPSULE | Freq: Three times a day (TID) | ORAL | Status: DC
  Administered 2018-02-19 – 2018-02-21 (×5): 200 mg via ORAL
  Filled 2018-02-19 (×5): qty 2

## 2018-02-19 MED ORDER — POTASSIUM CHLORIDE 10 MEQ/100ML IV SOLN
10.0000 meq | Freq: Once | INTRAVENOUS | Status: AC
  Administered 2018-02-19: 10 meq via INTRAVENOUS
  Filled 2018-02-19: qty 100

## 2018-02-19 MED ORDER — LATANOPROST 0.005 % OP SOLN
1.0000 [drp] | Freq: Every day | OPHTHALMIC | Status: DC
  Filled 2018-02-19: qty 2.5

## 2018-02-19 MED ORDER — SODIUM CHLORIDE 0.9 % IV BOLUS
500.0000 mL | Freq: Once | INTRAVENOUS | Status: AC
  Administered 2018-02-19: 500 mL via INTRAVENOUS

## 2018-02-19 MED ORDER — ENSURE ENLIVE PO LIQD: 237.0000 mL | Freq: Two times a day (BID) | ORAL | Status: DC

## 2018-02-19 MED ORDER — MAGIC MOUTHWASH
5.0000 mL | Freq: Four times a day (QID) | ORAL | Status: DC
  Administered 2018-02-20 – 2018-02-21 (×4): 5 mL via ORAL
  Filled 2018-02-19 (×10): qty 5

## 2018-02-19 MED ORDER — PAROXETINE HCL 10 MG PO TABS
10.0000 mg | ORAL_TABLET | Freq: Every day | ORAL | Status: DC
  Administered 2018-02-20 – 2018-02-21 (×2): 10 mg via ORAL
  Filled 2018-02-19 (×2): qty 1

## 2018-02-19 MED ORDER — POTASSIUM CHLORIDE CRYS ER 20 MEQ PO TBCR
40.0000 meq | EXTENDED_RELEASE_TABLET | Freq: Once | ORAL | Status: AC
  Administered 2018-02-19: 40 meq via ORAL
  Filled 2018-02-19: qty 2

## 2018-02-19 MED ORDER — OXYCODONE HCL 5 MG PO TABS
10.0000 mg | ORAL_TABLET | Freq: Four times a day (QID) | ORAL | Status: DC | PRN
  Administered 2018-02-19 – 2018-02-21 (×3): 10 mg via ORAL
  Filled 2018-02-19 (×3): qty 2

## 2018-02-19 MED ORDER — INSULIN ASPART 100 UNIT/ML ~~LOC~~ SOLN
0.0000 [IU] | Freq: Three times a day (TID) | SUBCUTANEOUS | Status: DC
  Administered 2018-02-20: 1 [IU] via SUBCUTANEOUS
  Administered 2018-02-20: 2 [IU] via SUBCUTANEOUS

## 2018-02-19 MED ORDER — LACTATED RINGERS IV SOLN
INTRAVENOUS | Status: AC
  Administered 2018-02-19: 21:00:00 via INTRAVENOUS

## 2018-02-19 MED ORDER — SODIUM CHLORIDE 0.9% FLUSH
10.0000 mL | INTRAVENOUS | Status: DC | PRN
  Administered 2018-02-21 (×2): 10 mL
  Filled 2018-02-19 (×2): qty 40

## 2018-02-19 MED ORDER — ACETAMINOPHEN 325 MG PO TABS: 650.0000 mg | ORAL_TABLET | Freq: Four times a day (QID) | ORAL | Status: DC | PRN

## 2018-02-19 MED ORDER — IPRATROPIUM-ALBUTEROL 0.5-2.5 (3) MG/3ML IN SOLN
3.0000 mL | Freq: Three times a day (TID) | RESPIRATORY_TRACT | Status: DC
  Administered 2018-02-19 – 2018-02-21 (×5): 3 mL via RESPIRATORY_TRACT
  Filled 2018-02-19 (×5): qty 3

## 2018-02-19 MED ORDER — MAGNESIUM SULFATE 2 GM/50ML IV SOLN
2.0000 g | Freq: Once | INTRAVENOUS | Status: AC
  Administered 2018-02-19: 2 g via INTRAVENOUS
  Filled 2018-02-19: qty 50

## 2018-02-19 MED ORDER — LUBIPROSTONE 24 MCG PO CAPS
24.0000 ug | ORAL_CAPSULE | Freq: Two times a day (BID) | ORAL | Status: DC
  Administered 2018-02-20 – 2018-02-21 (×3): 24 ug via ORAL
  Filled 2018-02-19 (×4): qty 1

## 2018-02-19 MED ORDER — ALBUTEROL SULFATE (2.5 MG/3ML) 0.083% IN NEBU: 2.5000 mg | INHALATION_SOLUTION | RESPIRATORY_TRACT | Status: DC | PRN

## 2018-02-19 MED ORDER — METOPROLOL TARTRATE 25 MG PO TABS
12.5000 mg | ORAL_TABLET | Freq: Two times a day (BID) | ORAL | Status: DC
  Administered 2018-02-19 – 2018-02-21 (×4): 12.5 mg via ORAL
  Filled 2018-02-19 (×5): qty 1

## 2018-02-19 MED ORDER — PROCHLORPERAZINE MALEATE 10 MG PO TABS: 10.0000 mg | ORAL_TABLET | Freq: Four times a day (QID) | ORAL | Status: DC | PRN

## 2018-02-19 MED ORDER — ALBUTEROL SULFATE HFA 108 (90 BASE) MCG/ACT IN AERS: 2.0000 | INHALATION_SPRAY | RESPIRATORY_TRACT | Status: DC | PRN

## 2018-02-19 MED ORDER — ALPRAZOLAM 0.5 MG PO TABS
0.5000 mg | ORAL_TABLET | Freq: Two times a day (BID) | ORAL | Status: DC | PRN
  Administered 2018-02-19 – 2018-02-21 (×3): 0.5 mg via ORAL
  Filled 2018-02-19 (×3): qty 1

## 2018-02-19 MED ORDER — ONDANSETRON HCL 4 MG/2ML IJ SOLN
4.0000 mg | Freq: Once | INTRAMUSCULAR | Status: AC
  Administered 2018-02-19: 4 mg via INTRAVENOUS
  Filled 2018-02-19: qty 2

## 2018-02-19 MED ORDER — OXYCODONE HCL 10 MG PO TABS: 10.0000 mg | ORAL_TABLET | Freq: Four times a day (QID) | ORAL | Status: DC | PRN

## 2018-02-19 MED ORDER — HEPARIN SOD (PORK) LOCK FLUSH 100 UNIT/ML IV SOLN
500.0000 [IU] | Freq: Once | INTRAVENOUS | Status: DC
  Administered 2018-02-21: 500 [IU]
  Filled 2018-02-19: qty 5

## 2018-02-19 MED ORDER — SODIUM CHLORIDE 0.9% FLUSH
3.0000 mL | Freq: Once | INTRAVENOUS | Status: AC
  Administered 2018-02-19: 3 mL via INTRAVENOUS

## 2018-02-19 MED ORDER — DOCUSATE SODIUM 100 MG PO CAPS
100.0000 mg | ORAL_CAPSULE | Freq: Every day | ORAL | Status: DC
  Administered 2018-02-20 – 2018-02-21 (×2): 100 mg via ORAL
  Filled 2018-02-19 (×2): qty 1

## 2018-02-19 MED ORDER — VITAMIN B-12 1000 MCG PO TABS
1000.0000 ug | ORAL_TABLET | Freq: Every day | ORAL | Status: DC
  Administered 2018-02-20 – 2018-02-21 (×2): 1000 ug via ORAL
  Filled 2018-02-19 (×2): qty 1

## 2018-02-19 MED ORDER — SCOPOLAMINE 1 MG/3DAYS TD PT72
1.0000 | MEDICATED_PATCH | TRANSDERMAL | Status: DC
  Administered 2018-02-21: 1.5 mg via TRANSDERMAL
  Filled 2018-02-19: qty 1

## 2018-02-19 MED ORDER — TRAVOPROST 0.004 % OP SOLN: 1.0000 [drp] | Freq: Every day | OPHTHALMIC | Status: DC

## 2018-02-19 NOTE — Discharge Instructions (Addendum)
We recommended admission today, however you prefer to go home and follow-up with your primary care provider.   It is important that you follow-up with your oncologist tomorrow regarding your visit today. Schedule an appointment with the cardiologist as soon as possible for evaluation of your new diagnosis of atrial fibrillation.

## 2018-02-19 NOTE — H&P (Addendum)
History and Physical    Scott Gomez EPP:[id number] DOB: [birth year] DOA: 02/19/2018  PCP: Abner Greenspan, MD Patient coming from: Home  I have personally briefly reviewed patient's old medical records in [facility]  Chief Complaint: Dehydration, nausea, vomiting  HPI: Scott Gomez is a 84 y.o. 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 He received Neulasta injection on 1/31. Since initiating chemotherapy in November of 2019, he has lost approximately 50 pounds. He had been unable to eat food due to chemotherapy-related mucositis and poor appetite. He feels that his weakness and unsteadiness has worsened in the last 3-4 days. He denies fever, chills, night sweats, cough, hemoptysis. Endorses exertional dyspnea, nausea and vomiting. Denies diarrhea or constipation. No blood in stool or urine. No sick contacts.  ED Course: In ED, he was afebrile, normotensive and saturating comfortably on room air.  Initial EKG showed atrial fibrillation with a rate of 89.  He does not carry a prior diagnosis of atrial fibrillation.  Labs notable for WBC 1.5, absolute neutrophil count 1100, Hgb 8.9, platelets 38, potassium 2.9, BUN 14, creatinine 0.89.  He received IV fluids and potassium IV and oral while in the ED.  Review of Systems: As per HPI otherwise 10 point review of systems negative.   Past Medical History:  Diagnosis Date  . Anxiety   . Arthritis    osteoarthritis both hands  . Carpal tunnel syndrome   . COPD (chronic obstructive pulmonary disease) ([place])   . CPAP (continuous positive airway pressure) dependence   .  Diabetes mellitus    type II  . Fatigue   . History of kidney stones   . Hypercholesteremia   . Hypopotassemia   . Leg cramps   . Lung cancer ([place])   . Nephrolithiasis    Hx of  . Palpitations    Hx of   . Shoulder pain, left   . Snoring   . Testosterone deficiency   . Ulcer    peptic ulcer disease    Past Surgical History:  Procedure Laterality Date  . BACK SURGERY  1978   x2 disc  . IR IMAGING GUIDED PORT INSERTION  12/19/2017     reports that he quit smoking about 25 years ago. His smoking use included cigarettes. He has a 76.00 pack-year smoking history. He has never used smokeless tobacco. He reports that he does not drink alcohol or use drugs.  Allergies  Allergen Reactions  . Buspirone Hcl     REACTION: itching  . Crestor [Rosuvastatin Calcium]     Increased his blood sugar   . Prednisone     REACTION: sick, per wife he can tolerate for a few days and then stomach upset occurs.     Family History  Problem Relation Age of Onset  . Heart disease Brother        MI  . Kidney cancer Brother   . Hypertension Mother   . Allergies Mother   . Hypertension Father   . Allergies Father   . Diabetes Father   . Heart disease Father   . Breast cancer Sister   . Kidney cancer Sister   . Colon cancer  Neg Hx   . Liver cancer Neg Hx   . AAA (abdominal aortic aneurysm) Neg Hx     Prior to Admission medications   Medication Sig Start Date End Date Taking? Authorizing Provider  albuterol (PROAIR HFA) 108 (90 Base) MCG/ACT inhaler Inhale 2 puffs into the lungs every 4 (four) hours as needed for wheezing or shortness of breath. 11/03/17  Yes Martyn Ehrich, NP  ALPRAZolam Duanne Moron) 0.5 MG tablet TAKE ONE (1) TABLET BY MOUTH TWO (2) TIMES DAILY AS NEEDED FOR ANXIETY Patient taking differently: Take 0.5 mg by mouth 2 (two) times daily as needed for anxiety.  12/19/17  Yes [name], [name], MD  cyclobenzaprine (FLEXERIL) 10 MG tablet Take 10 mg by mouth 2 (two) times daily.   11/19/10  Yes [name], [name], MD  docusate sodium (COLACE) 100 MG capsule Take 100 mg by mouth daily.   Yes [provider]  gabapentin (NEURONTIN) 100 MG capsule Take 200 mg by mouth 3 (three) times daily.    Yes [provider]  hyoscyamine (LEVSIN SL) 0.125 MG SL tablet Place 1 tablet (0.125 mg total) under the tongue every 12 (twelve) hours as needed. Patient taking differently: Place 0.125 mg under the tongue every 12 (twelve) hours as needed for cramping.  01/27/18  Yes Tanner, Lyndon Code., PA-C  levalbuterol (XOPENEX) 0.63 MG/3ML nebulizer solution Take 3 mLs (0.63 mg total) by nebulization every 4 (four) hours as needed for wheezing or shortness of breath. 11/07/17  Yes Martyn Ehrich, NP  lidocaine-prilocaine (EMLA) cream Apply to port 1 hour prior port access 01/21/18  Yes Curt Bears, MD  lisinopril (PRINIVIL,ZESTRIL) 10 MG tablet Take 1 tablet (10 mg total) by mouth daily. 04/14/17  Yes [name], [name], MD  lubiprostone (AMITIZA) 24 MCG capsule Take 1 capsule (24 mcg total) by mouth 2 (two) times daily with a meal. 01/16/18  Yes Esterwood, Amy S, PA-C  magnesium oxide (MAG-OX) 400 (241.3 Mg) MG tablet Take 1 tablet (400 mg total) by mouth daily. 12/25/17  Yes Curcio, Roselie Awkward, NP  ondansetron (ZOFRAN) 8 MG tablet Take 1 tablet (8 mg total) by mouth every 8 (eight) hours as needed for nausea or vomiting. 12/24/17  Yes [name], [name], MD  ONE TOUCH LANCETS MISC Use to test blood sugar once daily and as needed. Dx 790.29 05/06/13  Yes [name], [name], MD  Oxycodone HCl 10 MG TABS Take 10 mg by mouth every 6 (six) hours as needed (pain).  10/28/12  Yes [provider]  PARoxetine (PAXIL) 10 MG tablet Take 1 tablet (10 mg total) by mouth daily. 12/24/17  Yes [name], [name], MD  potassium chloride (MICRO-K) 10 MEQ CR capsule Take 20 mEq by mouth 2 (two) times daily. 02/16/18  Yes [provider]  prochlorperazine (COMPAZINE) 10 MG tablet TAKE 1 TABLET (10 MG TOTAL) BY  MOUTH EVERY 6 (SIX) HOURS AS NEEDED FOR NAUSEA OR VOMITING. 02/14/18  Yes Curt Bears, MD  scopolamine (TRANSDERM-SCOP) 1 MG/3DAYS Place 1 patch (1.5 mg total) onto the skin every 3 (three) days. 02/03/18  Yes Tanner, Lyndon Code., PA-C  sucralfate (CARAFATE) 1 g tablet Take 1 tablet (1 g total) by mouth 4 (four) times daily -  with meals and at bedtime. 5 min before meals for radiation induced esophagitis 01/16/18  Yes Tyler Pita, MD  travoprost, benzalkonium, (TRAVATAN) 0.004 % ophthalmic solution Place 1 drop into both eyes at bedtime.     Yes [provider]  vitamin B-12 (CYANOCOBALAMIN) 1000 MCG tablet Take 1,000 mcg by mouth daily.   Yes [provider]  apixaban (ELIQUIS) 5 MG TABS tablet Take 1 tablet (5 mg total) by mouth 2 (two) times daily. Patient not taking: Reported on 02/19/2018 01/26/18   Rigoberto Noel, MD  Blood Glucose Monitoring Suppl (ONETOUCH VERIO IQ SYSTEM) w/Device KIT Use to test blood sugar once daily and as needed dx R73.9 10/14/16   [name], Roque Lias A, MD  glucose blood (ONETOUCH VERIO) test strip USE TO CHECK BLOOD SUGAR ONCE DAILY 04/23/17   [name], Roque Lias A, MD  glucose blood test strip Use as instructed 12/24/17   [name], [name], MD  pantoprazole (PROTONIX) 40 MG tablet Take 1 tablet (40 mg total) by mouth 2 (two) times daily. 01/08/18 02/07/18  Tanner, Lyndon Code., PA-C  potassium chloride SA (K-DUR,KLOR-CON) 20 MEQ tablet TAKE ONE (1) TABLET BY MOUTH EVERY DAY Patient taking differently: Take 20 mEq by mouth daily.  04/14/17   [name], [name], MD  potassium chloride SA (K-DUR,KLOR-CON) 20 MEQ tablet Take 1 tablet (20 mEq total) by mouth 2 (two) times daily for 10 days. Patient not taking: Reported on 02/19/2018 02/16/18 02/26/18  Curt Bears, MD  Tiotropium Bromide Monohydrate (SPIRIVA RESPIMAT) 2.5 MCG/ACT AERS Inhale 2 puffs into the lungs daily. Patient not taking: Reported on 02/19/2018 01/26/18   Rigoberto Noel, MD    Physical Exam: Vitals:   02/19/18 1900  02/19/18 1906 02/19/18 1930 02/19/18 2014  BP: 133/89 133/89 (!) 142/101 (!) 157/98  Pulse:  90  97  Resp: (!) 24 20 (!) 28 16  Temp:  99.1 F (37.3 C)  98.8 F (37.1 C)  TempSrc:  Oral  Oral  SpO2:  95%  99%  Weight:      Height:        Constitutional: thin, elderly man in NAD, calm, comfortable Eyes: PERRL, lids and conjunctivae normal ENMT: Mucous membranes are dry. Posterior pharynx with mild erythema, no exudates. Neck: normal, supple, no masses Respiratory: clear to auscultation bilaterally, no wheezing, no crackles. Normal respiratory effort. No accessory muscle use.  Cardiovascular: Irregularly irregular rate and rhythm, no murmurs / rubs / gallops. 2+ pitting edema present. 2+ pedal pulses.  Abdomen: no tenderness, no masses palpated. No hepatosplenomegaly. Bowel sounds positive.  Musculoskeletal: no clubbing / cyanosis. No joint deformity upper and lower extremities. Good ROM, no contractures. Decreased muscle tone.  Skin: erythema at XRT site on right upper chest and back with skin sloughing anteriorly Neurologic: CN 2-12 grossly intact. Sensation intact, DTR normal. Strength 5/5 in all 4.  Psychiatric: Normal judgment and insight. Alert and oriented x 3. Normal mood.   Labs on Admission: I have personally reviewed following labs and imaging studies  CBC: Recent Labs  Lab 02/16/18 1132 02/19/18 1326  WBC 10.8* 1.5*  NEUTROABS 9.8 1.1*  HGB 10.3* 8.9*  HCT 30.3* 27.6*  MCV 86.1 87.3  PLT 108* 38*   Basic Metabolic Panel: Recent Labs  Lab 02/16/18 1132 02/19/18 1326 02/19/18 2013  NA 133* 132*  --   K 2.9* 2.9*  --   CL 94* 95*  --   CO2 31 27  --   GLUCOSE 116* 141*  --   BUN 17 14  --   CREATININE 0.99 0.89  --   CALCIUM 8.4* 8.4*  --   MG 1.4*  --  1.5*  PHOS  --   --  2.3*   GFR: Estimated Creatinine Clearance: 63  mL/min (by C-G formula based on SCr of 0.89 mg/dL). Liver Function Tests: Recent Labs  Lab 02/16/18 1132 02/19/18 1326  AST 17  13*  ALT 8 11  ALKPHOS 91 60  BILITOT 0.7 0.9  PROT 5.7* 5.9*  ALBUMIN 3.2* 3.4*   No results for input(s): LIPASE, AMYLASE in the last 168 hours. No results for input(s): AMMONIA in the last 168 hours. Coagulation Profile: Recent Labs  Lab 02/19/18 1326  INR 1.25   Cardiac Enzymes: No results for input(s): CKTOTAL, CKMB, CKMBINDEX, TROPONINI in the last 168 hours. BNP (last 3 results) No results for input(s): PROBNP in the last 8760 hours. HbA1C: No results for input(s): HGBA1C in the last 72 hours. CBG: Recent Labs  Lab 02/19/18 2038  GLUCAP 88   Lipid Profile: No results for input(s): CHOL, HDL, LDLCALC, TRIG, CHOLHDL, LDLDIRECT in the last 72 hours. Thyroid Function Tests: No results for input(s): TSH, T4TOTAL, FREET4, T3FREE, THYROIDAB in the last 72 hours. Anemia Panel: No results for input(s): VITAMINB12, FOLATE, FERRITIN, TIBC, IRON, RETICCTPCT in the last 72 hours. Urine analysis:    Component Value Date/Time   COLORURINE YELLOW 02/19/2018 [place] 02/19/2018 1457   LABSPEC 1.011 02/19/2018 1457   PHURINE 8.0 02/19/2018 1457   GLUCOSEU 50 (A) 02/19/2018 1457   HGBUR NEGATIVE 02/19/2018 [place] 02/19/2018 1457   BILIRUBINUR negative 11/25/2012 1312   KETONESUR 5 (A) 02/19/2018 1457   PROTEINUR 30 (A) 02/19/2018 1457   UROBILINOGEN 0.2 11/25/2012 1312   UROBILINOGEN 2.0 (H) 12/31/2010 2219   NITRITE NEGATIVE 02/19/2018 1457   LEUKOCYTESUR NEGATIVE 02/19/2018 1457    Radiological Exams on Admission: No results found.  EKG: Independently reviewed. Afib, rate 89.  Assessment/Plan Active Problems:   Failure to thrive in adult  Failure to thrive Chemotherapy-induced nausea and vomiting Chemotherapy-induced mucositis Pt presenting with 50 lb weight loss, dehydration, poor PO intake, nausea and vomiting in setting of limited stage SCLC on etoposide/carboplatin. - Hydrate with LR at 100 cc/h - Anti-emetics PRN for  nausea, vomiting - Continue scopolamine patch per home - Magic mouthwash, sucralfate for mucositis - Consider addition of appetite stimulant - Consider discussion of PEG placement - Nutrition consult - Continue B12, MVI  New Afib Lower extremity edema Afib is reportedly new. It is not present on any prior EKGs reviewed. Likely in setting of electrolyte abnormalities. Pt also has lower extremity edema that may suggest new diagnosis of heart failure. - Continue to hold Eliquis given thrombocytopenia - Start low-dose beta blockade - Replete electrolytes - Monitor on telemetry - Obtain TTE  Hypokalemia, hypomagnesemia, hypophosphatemia - Replete lytes  Limited stage SCLC on active chemotherapy Chemotherapy-induced pancytopenia - Continue home oxycodone for pain, anti-emetics as above - Volume resuscitation as above - Pt is not neutropenic on admission; last received Neulasta on 1/31 - Goal Hgb >7, plt >10  H/o DVT previously on Eliquis DVT diagnosed in November per pt. Was previously on Eliquis, but discontinued in January due to cost. - Hold Eliquis in setting of thrombocytopenia - Can discuss re-initiation of anticoagulation as outpatient  Ascending thoracic aortic aneurysm - Previously noted on CT chest; increased in size from 4.3 cm to 4.6 cm from 12/2017 to 01/2018 - Initiate beta blockade as above - Continue outpatient follow up  Chronic medical conditions: - Moderately severe COPD: Duo-Nebs TID sched - Depression/anxiety: continue home Paxil, Xanax  DVT prophylaxis: None Code Status: DNR/DNI Disposition Plan: Home in 1-2 days Consults called:  None Admission status: Inpatient, telemetry   Gunnison Chahal Sharene Butters MD [facility]  If 7PM-7AM, please contact night-coverage [email or web address] Password [email or web address]  02/19/2018, 9:11 PM

## 2018-02-19 NOTE — ED Notes (Signed)
It was recommended to pt that he be admitted.  Upon going into room to D/C pt, and this RN explaining again his new diagnosis, pt changed his mind and wants to be admitted again.

## 2018-02-19 NOTE — ED Notes (Signed)
Pt given urinal and made aware of needed sample

## 2018-02-19 NOTE — Progress Notes (Signed)
Pt refused CPAP qhs.  Pt states that he hasn't been able to tolerate sleeping with CPAP on for the past 4 months and doesn't want to wear it while in the hospital.  Pt encouraged to contact RT should he change his mind.

## 2018-02-19 NOTE — ED Notes (Signed)
ED TO INPATIENT HANDOFF REPORT  Name/Age/Gender Scott Gomez 84 y.o. male  Code Status Code Status History    Date Active Date Inactive Code Status Order ID Comments User Context   12/17/2017 0304 12/19/2017 2146 Full Code [id number]  Rise Patience, MD ED      Home/SNF/Other Home  Chief Complaint chemo patient, weakness  Level of Care/Admitting Diagnosis ED Disposition    ED Disposition Condition [place] [facility] Area: [facility] [[id number]]  Level of Care: Telemetry [5]  Admit to tele based on following criteria: Complex arrhythmia (Bradycardia/Tachycardia)  Diagnosis: Failure to thrive in adult [865784]  Admitting Physician: Bennie Pierini [[id number]]  Attending Physician: Jonnie Finner, [place] [[id number]]  PT Class (Do Not Modify): Observation [104]  PT Acc Code (Do Not Modify): Observation [10022]       Medical History Past Medical History:  Diagnosis Date  . Anxiety   . Arthritis    osteoarthritis both hands  . Carpal tunnel syndrome   . COPD (chronic obstructive pulmonary disease) ([place])   . CPAP (continuous positive airway pressure) dependence   . Diabetes mellitus    type II  . Fatigue   . History of kidney stones   . Hypercholesteremia   . Hypopotassemia   . Leg cramps   . Lung cancer ([place])   . Nephrolithiasis    Hx of  . Palpitations    Hx of   . Shoulder pain, left   . Snoring   . Testosterone deficiency   . Ulcer    peptic ulcer disease    Allergies Allergies  Allergen Reactions  . Buspirone Hcl     REACTION: itching  . Crestor [Rosuvastatin Calcium]     Increased his blood sugar   . Prednisone     REACTION: sick, per wife he can tolerate for a few days and then stomach upset occurs.     IV Location/Drains/Wounds Patient Lines/Drains/Airways Status   Active Line/Drains/Airways    Name:   Placement date:   Placement time:   Site:   Days:   Implanted Port 12/19/17 Left Chest   12/19/17    1111     Chest   61          Labs/Imaging Results for orders placed or performed during the hospital encounter of 02/19/18 (from the past 48 hour(s))  Culture, blood (Routine x 2)     Status: None (Preliminary result)   Collection Time: 02/19/18 12:46 PM  Result Value Ref Range   Specimen Description      BLOOD LEFT CHEST PORTA CATH Performed at [facility], [address] [place], [place] [address]    Special Requests      BOTTLES DRAWN AEROBIC AND ANAEROBIC Blood Culture adequate volume Performed at [facility] [address], [place], [place] [address]    Culture PENDING    Report Status PENDING   Lactic acid, plasma     Status: None   Collection Time: 02/19/18  1:23 PM  Result Value Ref Range   Lactic Acid, Venous 1.1 0.5 - 1.9 mmol/L    Comment: Performed at [facility], [place] [address] [place], [place] [address]  Comprehensive metabolic panel     Status: Abnormal   Collection Time: 02/19/18  1:26 PM  Result Value Ref Range   Sodium 132 (L) 135 - 145 mmol/L   Potassium 2.9 (L) 3.5 - 5.1 mmol/L   Chloride 95 (  L) 98 - 111 mmol/L   CO2 27 22 - 32 mmol/L   Glucose, Bld 141 (H) 70 - 99 mg/dL   BUN 14 8 - 23 mg/dL   Creatinine, Ser 0.89 0.61 - 1.24 mg/dL   Calcium 8.4 (L) 8.9 - 10.3 mg/dL   Total Protein 5.9 (L) 6.5 - 8.1 g/dL   Albumin 3.4 (L) 3.5 - 5.0 g/dL   AST 13 (L) 15 - 41 U/L   ALT 11 0 - 44 U/L   Alkaline Phosphatase 60 38 - 126 U/L   Total Bilirubin 0.9 0.3 - 1.2 mg/dL   GFR calc non Af Amer >60 >60 mL/min   GFR calc Af Amer >60 >60 mL/min   Anion gap 10 5 - 15    Comment: Performed at [facility], [place] [address], [place], [place] [address]  CBC with Differential     Status: Abnormal   Collection Time: 02/19/18  1:26 PM  Result Value Ref Range   WBC 1.5 (L) 4.0 - 10.5 K/uL   RBC 3.16 (L) 4.22 - 5.81 MIL/uL   Hemoglobin 8.9 (L) 13.0 - 17.0 g/dL   HCT 27.6 (L) 39.0 - 52.0 %   MCV 87.3 80.0 - 100.0  fL   MCH 28.2 26.0 - 34.0 pg   MCHC 32.2 30.0 - 36.0 g/dL   RDW 16.0 (H) 11.5 - 15.5 %   Platelets 38 (L) 150 - 400 K/uL    Comment: REPEATED TO VERIFY SPECIMEN CHECKED FOR CLOTS Immature Platelet Fraction may be clinically indicated, consider ordering this additional test [id number] CONSISTENT WITH PREVIOUS RESULT    nRBC 0.0 0.0 - 0.2 %   Neutrophils Relative % 76 %   Neutro Abs 1.1 (L) 1.7 - 7.7 K/uL   Lymphocytes Relative 6 %   Lymphs Abs 0.1 (L) 0.7 - 4.0 K/uL   Monocytes Relative 16 %   Monocytes Absolute 0.2 0.1 - 1.0 K/uL   Eosinophils Relative 0 %   Eosinophils Absolute 0.0 0.0 - 0.5 K/uL   Basophils Relative 1 %   Basophils Absolute 0.0 0.0 - 0.1 K/uL   WBC Morphology MORPHOLOGY UNREMARKABLE    Immature Granulocytes 1 %   Abs Immature Granulocytes 0.01 0.00 - 0.07 K/uL    Comment: Performed at [facility], [place] [address] [place], [place] [address]  Protime-INR     Status: Abnormal   Collection Time: 02/19/18  1:26 PM  Result Value Ref Range   Prothrombin Time 15.6 (H) 11.4 - 15.2 seconds   INR 1.25     Comment: Performed at [facility], [place] [address] [place], [place] [address]  Urinalysis, Routine w reflex microscopic     Status: Abnormal   Collection Time: 02/19/18  2:57 PM  Result Value Ref Range   Color, Urine YELLOW YELLOW   APPearance CLEAR CLEAR   Specific Gravity, Urine 1.011 1.005 - 1.030   pH 8.0 5.0 - 8.0   Glucose, UA 50 (A) NEGATIVE mg/dL   Hgb urine dipstick NEGATIVE NEGATIVE   Bilirubin Urine NEGATIVE NEGATIVE   Ketones, ur 5 (A) NEGATIVE mg/dL   Protein, ur 30 (A) NEGATIVE mg/dL   Nitrite NEGATIVE NEGATIVE   Leukocytes, UA NEGATIVE NEGATIVE   RBC / HPF 0-5 0 - 5 RBC/hpf   WBC, UA 0-5 0 - 5 WBC/hpf   Bacteria, UA NONE SEEN NONE SEEN   Squamous Epithelial / LPF 0-5 0 - 5   Mucus PRESENT  Comment: Performed at [facility], [place] [address] [place], [place] [address]  Lactic acid,  plasma     Status: None   Collection Time: 02/19/18  5:37 PM  Result Value Ref Range   Lactic Acid, Venous 0.7 0.5 - 1.9 mmol/L    Comment: Performed at [facility], [place] [address] [place], [place] [address]   No results found. EKG Interpretation  Date/Time:  Thursday February 19 2018 12:22:51 EST Ventricular Rate:  89 PR Interval:    QRS Duration: 81 QT Interval:  340 QTC Calculation: 414 R Axis:   27 Text Interpretation:  Atrial fibrillation Abnormal ekg Confirmed by Carmin Muskrat [phone]) on 02/19/2018 5:42:28 PM   Pending Labs Unresulted Labs (From admission, onward)    Start     Ordered   02/19/18 1226  Culture, blood (Routine x 2)  BLOOD CULTURE X 2,   STAT     02/19/18 1225   Signed and Held  Magnesium  Add-on,   R     Signed and Held   Signed and Held  Phosphorus  Add-on,   R     Signed and Held   Signed and Occupational hygienist morning,   R     Signed and Held   Signed and Held  CBC  Tomorrow morning,   R     Signed and Held   Signed and Held  Magnesium  Tomorrow morning,   R     Signed and Held          Vitals/Pain Today's Vitals   02/19/18 1300 02/19/18 1330 02/19/18 1400 02/19/18 1906  BP: 125/90 127/75 126/83 133/89  Pulse:   77 90  Resp: 14 20 17 20   Temp:    99.1 F (37.3 C)  TempSrc:    Oral  SpO2: 98% 98% 98% 95%  Weight:      Height:      PainSc:        Isolation Precautions No active isolations  Medications Medications  heparin lock flush 100 unit/mL (has no administration in time range)  sodium chloride flush (NS) 0.9 % injection 3 mL (3 mLs Intravenous Given 02/19/18 1420)  ondansetron (ZOFRAN) injection 4 mg (4 mg Intravenous Given 02/19/18 1408)  sodium chloride 0.9 % bolus 500 mL (0 mLs Intravenous Stopped 02/19/18 1514)  potassium chloride 10 mEq in 100 mL IVPB (0 mEq Intravenous Stopped 02/19/18 1519)  sodium chloride 0.9 % bolus 500 mL (500 mLs Intravenous New Bag/Given 02/19/18 1737)     Mobility walks with device

## 2018-02-19 NOTE — ED Provider Notes (Addendum)
[facility] DEPT Provider Note   CSN: [id number] Arrival date & time: 02/19/18  1210     History   Chief Complaint Chief Complaint  Patient presents with  . Weakness  . chemo patient    HPI Scott Gomez is a 84 y.o. male with past medical history of COPD, nephrolithiasis, PUD, thoracic aortic aneurysm, testosterone deficiency, small cell lung cancer currently undergoing chemo/radiation treatment (last chemo on 02/11/2018, radiation 02/12/2018), presenting to the emergency department with complaint of gradually worsening generalized weakness and fatigue this week.  Patient states he has had weakness and fatigue since he began chemoradiation treatment for his small cell lung cancer, however this past week has been worse.  His weakness causes him now to have difficulty ambulating at home to the point where he needs to use a bedside commode.  He also has had ongoing nausea and vomiting, with reports of difficulty managing with multiple prescription medications.  Currently taking Compazine, last dose this morning for nausea and vomiting, however without much relief.  He has had decreased appetite.  States overnight he had very frequent urination, about once every hour.  His urine appeared dark this morning.  No associated dysuria.  Denies fevers or chills, shortness of breath, cough, abdominal pain.  The history is provided by the patient.    Past Medical History:  Diagnosis Date  . Anxiety   . Arthritis    osteoarthritis both hands  . Carpal tunnel syndrome   . COPD (chronic obstructive pulmonary disease) ([place])   . CPAP (continuous positive airway pressure) dependence   . Diabetes mellitus    type II  . Fatigue   . History of kidney stones   . Hypercholesteremia   . Hypopotassemia   . Leg cramps   . Lung cancer ([place])   . Nephrolithiasis    Hx of  . Palpitations    Hx of   . Shoulder pain, left   . Snoring   . Testosterone deficiency   . Ulcer      peptic ulcer disease    Patient Active Problem List   Diagnosis Date Noted  . Port-A-Cath in place 01/08/2018  . Protein calorie malnutrition ([place]) 12/26/2017  . Hypomagnesemia 12/26/2017  . Malnutrition of moderate degree 12/18/2017  . Duodenitis   . Small cell lung cancer, right upper lobe ([place]) 12/04/2017  . Encounter for antineoplastic chemotherapy 12/04/2017  . Goals of care, counseling/discussion 12/04/2017  . DVT (deep venous thrombosis) ([place]) 11/27/2017  . Aortic aneurysm ([place]) 11/18/2017  . Small cell lung cancer ([place]) 11/16/2017  . GERD (gastroesophageal reflux disease) 11/07/2017  . Routine general medical examination at a health care facility 04/09/2015  . Osteoporosis 04/07/2015  . Constipation 05/16/2014  . Polyneuropathy 03/23/2014  . Encounter for Medicare annual wellness exam 11/25/2012  . Spinal stenosis 08/27/2011  . Vitamin B 12 deficiency 08/06/2011  . Falls frequently 05/01/2011  . Prostate cancer screening 11/07/2010  . DEPRESSION 02/26/2010  . Essential hypertension, benign 03/29/2008  . COPD (chronic obstructive pulmonary disease) ([place]) 07/08/2007  . Sleep apnea 07/08/2007  . TESTOSTERONE DEFICIENCY 06/22/2007  . Prediabetes 01/07/2007  . Generalized anxiety disorder 01/07/2007  . CARPAL TUNNEL SYNDROME 01/07/2007  . Peptic ulcer 01/07/2007  . OSTEOARTHRITIS, HANDS, BILATERAL 01/07/2007  . SHOULDER PAIN, LEFT, CHRONIC 01/07/2007  . PALPITATIONS, HX OF 01/07/2007  . Nephrolithiasis 01/07/2007    Past Surgical History:  Procedure Laterality Date  . BACK SURGERY  1978   x2 disc  .  IR IMAGING GUIDED PORT INSERTION  12/19/2017        Home Medications    Prior to Admission medications   Medication Sig Start Date End Date Taking? Authorizing Provider  albuterol (PROAIR HFA) 108 (90 Base) MCG/ACT inhaler Inhale 2 puffs into the lungs every 4 (four) hours as needed for wheezing or shortness of breath. 11/03/17  Yes Martyn Ehrich, NP   ALPRAZolam Duanne Moron) 0.5 MG tablet TAKE ONE (1) TABLET BY MOUTH TWO (2) TIMES DAILY AS NEEDED FOR ANXIETY Patient taking differently: Take 0.5 mg by mouth 2 (two) times daily as needed for anxiety.  12/19/17  Yes [name], [name], MD  cyclobenzaprine (FLEXERIL) 10 MG tablet Take 10 mg by mouth 2 (two) times daily.  11/19/10  Yes [name], [name], MD  docusate sodium (COLACE) 100 MG capsule Take 100 mg by mouth daily.   Yes [provider]  gabapentin (NEURONTIN) 100 MG capsule Take 200 mg by mouth 3 (three) times daily.    Yes [provider]  hyoscyamine (LEVSIN SL) 0.125 MG SL tablet Place 1 tablet (0.125 mg total) under the tongue every 12 (twelve) hours as needed. Patient taking differently: Place 0.125 mg under the tongue every 12 (twelve) hours as needed for cramping.  01/27/18  Yes Tanner, Lyndon Code., PA-C  levalbuterol (XOPENEX) 0.63 MG/3ML nebulizer solution Take 3 mLs (0.63 mg total) by nebulization every 4 (four) hours as needed for wheezing or shortness of breath. 11/07/17  Yes Martyn Ehrich, NP  lidocaine-prilocaine (EMLA) cream Apply to port 1 hour prior port access 01/21/18  Yes Curt Bears, MD  lisinopril (PRINIVIL,ZESTRIL) 10 MG tablet Take 1 tablet (10 mg total) by mouth daily. 04/14/17  Yes [name], [name], MD  lubiprostone (AMITIZA) 24 MCG capsule Take 1 capsule (24 mcg total) by mouth 2 (two) times daily with a meal. 01/16/18  Yes Esterwood, Amy S, PA-C  magnesium oxide (MAG-OX) 400 (241.3 Mg) MG tablet Take 1 tablet (400 mg total) by mouth daily. 12/25/17  Yes Curcio, Roselie Awkward, NP  ondansetron (ZOFRAN) 8 MG tablet Take 1 tablet (8 mg total) by mouth every 8 (eight) hours as needed for nausea or vomiting. 12/24/17  Yes [name], [name], MD  ONE TOUCH LANCETS MISC Use to test blood sugar once daily and as needed. Dx 790.29 05/06/13  Yes [name], [name], MD  Oxycodone HCl 10 MG TABS Take 10 mg by mouth every 6 (six) hours as needed (pain).  10/28/12  Yes [provider]  PARoxetine (PAXIL) 10 MG tablet Take 1 tablet (10 mg total) by mouth daily. 12/24/17  Yes [name], [name], MD  potassium chloride (MICRO-K) 10 MEQ CR capsule Take 20 mEq by mouth 2 (two) times daily. 02/16/18  Yes [provider]  prochlorperazine (COMPAZINE) 10 MG tablet TAKE 1 TABLET (10 MG TOTAL) BY MOUTH EVERY 6 (SIX) HOURS AS NEEDED FOR NAUSEA OR VOMITING. 02/14/18  Yes Curt Bears, MD  scopolamine (TRANSDERM-SCOP) 1 MG/3DAYS Place 1 patch (1.5 mg total) onto the skin every 3 (three) days. 02/03/18  Yes Tanner, Lyndon Code., PA-C  sucralfate (CARAFATE) 1 g tablet Take 1 tablet (1 g total) by mouth 4 (four) times daily -  with meals and at bedtime. 5 min before meals for radiation induced esophagitis 01/16/18  Yes Tyler Pita, MD  travoprost, benzalkonium, (TRAVATAN) 0.004 % ophthalmic solution Place 1 drop into both eyes at bedtime.     Yes [provider]  vitamin B-12 (CYANOCOBALAMIN)  1000 MCG tablet Take 1,000 mcg by mouth daily.   Yes [provider]  apixaban (ELIQUIS) 5 MG TABS tablet Take 1 tablet (5 mg total) by mouth 2 (two) times daily. Patient not taking: Reported on 02/19/2018 01/26/18   Rigoberto Noel, MD  Blood Glucose Monitoring Suppl (ONETOUCH VERIO IQ SYSTEM) w/Device KIT Use to test blood sugar once daily and as needed dx R73.9 10/14/16   [name], Roque Lias A, MD  glucose blood (ONETOUCH VERIO) test strip USE TO CHECK BLOOD SUGAR ONCE DAILY 04/23/17   [name], Roque Lias A, MD  glucose blood test strip Use as instructed 12/24/17   [name], [name], MD  pantoprazole (PROTONIX) 40 MG tablet Take 1 tablet (40 mg total) by mouth 2 (two) times daily. 01/08/18 02/07/18  Tanner, Lyndon Code., PA-C  potassium chloride SA (K-DUR,KLOR-CON) 20 MEQ tablet TAKE ONE (1) TABLET BY MOUTH EVERY DAY Patient taking differently: Take 20 mEq by mouth daily.  04/14/17   [name], [name], MD  potassium chloride SA (K-DUR,KLOR-CON) 20 MEQ tablet Take 1 tablet (20 mEq total) by mouth 2 (two) times daily  for 10 days. Patient not taking: Reported on 02/19/2018 02/16/18 02/26/18  Curt Bears, MD  Tiotropium Bromide Monohydrate (SPIRIVA RESPIMAT) 2.5 MCG/ACT AERS Inhale 2 puffs into the lungs daily. Patient not taking: Reported on 02/19/2018 01/26/18   Rigoberto Noel, MD    Family History Family History  Problem Relation Age of Onset  . Heart disease Brother        MI  . Kidney cancer Brother   . Hypertension Mother   . Allergies Mother   . Hypertension Father   . Allergies Father   . Diabetes Father   . Heart disease Father   . Breast cancer Sister   . Kidney cancer Sister   . Colon cancer Neg Hx   . Liver cancer Neg Hx   . AAA (abdominal aortic aneurysm) Neg Hx     Social History Social History   Tobacco Use  . Smoking status: Former Smoker    Packs/day: 2.00    Years: 38.00    Pack years: 76.00    Types: Cigarettes    Last attempt to quit: 01/14/1993    Years since quitting: 25.1  . Smokeless tobacco: Never Used  Substance Use Topics  . Alcohol use: No    Alcohol/week: 0.0 standard drinks  . Drug use: No     Allergies   Buspirone hcl; Crestor [rosuvastatin calcium]; and Prednisone   Review of Systems Review of Systems  Constitutional: Positive for appetite change and fatigue. Negative for fever.  Respiratory: Negative for cough and shortness of breath.   Gastrointestinal: Positive for nausea and vomiting. Negative for abdominal pain.  Genitourinary: Positive for frequency. Negative for dysuria.  Allergic/Immunologic: Positive for immunocompromised state.  All other systems reviewed and are negative.    Physical Exam Updated Vital Signs BP 126/83   Pulse 77   Temp 98.2 F (36.8 C) (Oral)   Resp 17   Ht _0  (1.727 m)   Wt 78.5 kg   SpO2 98%   BMI 26.30 kg/m   Physical Exam Vitals signs and nursing note reviewed.  Constitutional:      Appearance: He is well-developed.     Comments: Thin, pale-appearing male  HENT:     Head: Normocephalic and  atraumatic.     Mouth/Throat:     Mouth: Mucous membranes are moist.  Eyes:     Conjunctiva/sclera: Conjunctivae normal.  Cardiovascular:     Rate and Rhythm: Normal rate and regular rhythm.  Pulmonary:     Effort: Pulmonary effort is normal.     Breath sounds: Normal breath sounds.  Abdominal:     General: Bowel sounds are normal. There is no distension.     Palpations: Abdomen is soft.     Tenderness: There is no abdominal tenderness. There is no guarding or rebound.  Musculoskeletal:     Comments: Trace LE edema  Skin:    General: Skin is warm.  Neurological:     Mental Status: He is alert.  Psychiatric:        Behavior: Behavior normal.      ED Treatments / Results  Labs (all labs ordered are listed, but only abnormal results are displayed) Labs Reviewed  COMPREHENSIVE METABOLIC PANEL - Abnormal; Notable for the following components:      Result Value   Sodium 132 (*)    Potassium 2.9 (*)    Chloride 95 (*)    Glucose, Bld 141 (*)    Calcium 8.4 (*)    Total Protein 5.9 (*)    Albumin 3.4 (*)    AST 13 (*)    All other components within normal limits  CBC WITH DIFFERENTIAL/PLATELET - Abnormal; Notable for the following components:   WBC 1.5 (*)    RBC 3.16 (*)    Hemoglobin 8.9 (*)    HCT 27.6 (*)    RDW 16.0 (*)    Platelets 38 (*)    Neutro Abs 1.1 (*)    Lymphs Abs 0.1 (*)    All other components within normal limits  PROTIME-INR - Abnormal; Notable for the following components:   Prothrombin Time 15.6 (*)    All other components within normal limits  URINALYSIS, ROUTINE W REFLEX MICROSCOPIC - Abnormal; Notable for the following components:   Glucose, UA 50 (*)    Ketones, ur 5 (*)    Protein, ur 30 (*)    All other components within normal limits  CULTURE, BLOOD (ROUTINE X 2)  CULTURE, BLOOD (ROUTINE X 2)  LACTIC ACID, PLASMA  LACTIC ACID, PLASMA    EKG EKG Interpretation  Date/Time:  Thursday February 19 2018 12:22:51 EST Ventricular Rate:   89 PR Interval:    QRS Duration: 81 QT Interval:  340 QTC Calculation: 414 R Axis:   27 Text Interpretation:  Atrial fibrillation Abnormal ekg Confirmed by Carmin Muskrat [phone]) on 02/19/2018 5:42:28 PM   Radiology No results found.  Procedures Procedures (including critical care time)  Medications Ordered in ED Medications  sodium chloride 0.9 % bolus 500 mL (500 mLs Intravenous New Bag/Given 02/19/18 1737)  heparin lock flush 100 unit/mL (has no administration in time range)  sodium chloride flush (NS) 0.9 % injection 3 mL (3 mLs Intravenous Given 02/19/18 1420)  ondansetron (ZOFRAN) injection 4 mg (4 mg Intravenous Given 02/19/18 1408)  sodium chloride 0.9 % bolus 500 mL (0 mLs Intravenous Stopped 02/19/18 1514)  potassium chloride 10 mEq in 100 mL IVPB (0 mEq Intravenous Stopped 02/19/18 1519)     Initial Impression / Assessment and Plan / ED Course  I have reviewed the triage vital signs and the nursing notes.  Pertinent labs & imaging results that were available during my care of the patient were reviewed by me and considered in my medical decision making (see chart for details).  Clinical Course as of Feb 19 1818  Thu Feb 19, 2018  1433 pancytopenia  CBC with Differential(!) [JR]  1434 Pt discussed with and evaluated by Dr. Lita Mains. Will admit for IV hydration   [JR]  1633 Patient discussed with Dr. Irene Limbo with oncology. Agrees with discharge if symptoms controlled.   [JR]  1752 EKG reviewed with new onset a fib. Discussed recommendation with patient for admission, however declined. States he can follow up tomorrow with his oncologist. Declines anticoagulation.    [JR]    Clinical Course User Index [JR] Tedra Coppernoll, [place] N, PA-C    Patient with small cell carcinoma of the lung, currently undergoing chemo radiation therapy, presenting with 1 week of worsening generalized fatigue, weakness, nausea and vomiting.  Vital signs are stable, no fever.  Labs with pancytopenia,  hypokalemia of 2.9.  UA without evidence of infection.  Lactic acid within normal limits.  Patient hydrated and provided antiemetics.  Tolerating p.o.  EKG however showing new onset of atrial fibrillation,normal rate.  Not currently on anticoagulation.  Strongly recommended patient for admission, however declines and states he would rather not spend the night in the hospital and follow-up on an outpatient basis.  Discussed the risks of leaving.  Patient declined prescription for anticoagulation.  Stressed close follow-up.  Return precautions.  Patient is alert and oriented, acting appropriately.  Believe he is a capacity to make this decision.  Patient discharged.  Patient discussed with and evaluated by Dr. Lita Mains. Patient discussed with Dr. Vanita Panda.  6:47 PM . After discharge paperwork printed, pt changed his mind and is now agreeable to admission. Will consult hospitalist.  Final Clinical Impressions(s) / ED Diagnoses   Final diagnoses:  Atrial fibrillation, new onset ([place])  Pancytopenia ([place])  Chemotherapy-induced nausea and vomiting  Generalized weakness    ED Discharge Orders    None       Tambra Muller, [place] N, PA-C 02/19/18 1821    Yaslyn Cumby, [place] N, PA-C 02/19/18 Sharen Counter, MD 02/22/18 [phone]

## 2018-02-19 NOTE — ED Notes (Signed)
Patient refused chest x-ray. Patient stated, he had a CXR last week.

## 2018-02-19 NOTE — ED Triage Notes (Signed)
Patient c/o weakness and vomiting x 1 week. Patient states his last treatment of chemo ws 8 days ago and radiation 3 days ago.

## 2018-02-19 NOTE — Telephone Encounter (Addendum)
Very weak ,cannot get up very well. Using BSC. Fell twice 3 days ago. He is taking his kdur ,but not drinking adequate fluids. Last chemo 1/29 - Per Dr Julien Nordmann, I instructed hi wife to take pt to ED -

## 2018-02-20 ENCOUNTER — Other Ambulatory Visit: Payer: Self-pay

## 2018-02-20 ENCOUNTER — Inpatient Hospital Stay (HOSPITAL_COMMUNITY): Payer: Medicare Other

## 2018-02-20 DIAGNOSIS — I361 Nonrheumatic tricuspid (valve) insufficiency: Secondary | ICD-10-CM

## 2018-02-20 DIAGNOSIS — D61818 Other pancytopenia: Secondary | ICD-10-CM

## 2018-02-20 DIAGNOSIS — R112 Nausea with vomiting, unspecified: Secondary | ICD-10-CM

## 2018-02-20 DIAGNOSIS — R531 Weakness: Secondary | ICD-10-CM

## 2018-02-20 DIAGNOSIS — E43 Unspecified severe protein-calorie malnutrition: Secondary | ICD-10-CM

## 2018-02-20 DIAGNOSIS — T451X5A Adverse effect of antineoplastic and immunosuppressive drugs, initial encounter: Secondary | ICD-10-CM

## 2018-02-20 LAB — BASIC METABOLIC PANEL
Anion gap: 6 (ref 5–15)
BUN: 12 mg/dL (ref 8–23)
CO2: 26 mmol/L (ref 22–32)
Calcium: 8 mg/dL — ABNORMAL LOW (ref 8.9–10.3)
Chloride: 100 mmol/L (ref 98–111)
Creatinine, Ser: 0.82 mg/dL (ref 0.61–1.24)
GFR calc Af Amer: >60 mL/min (ref >60)
GFR calc non Af Amer: >60 mL/min (ref >60)
Glucose, Bld: 98 mg/dL (ref 70–99)
Potassium: 3.1 mmol/L — ABNORMAL LOW (ref 3.5–5.1)
SODIUM: 132 mmol/L — AB (ref 135–145)

## 2018-02-20 LAB — CBC
HCT: 24.2 % — ABNORMAL LOW (ref 39.0–52.0)
Hemoglobin: 7.9 g/dL — ABNORMAL LOW (ref 13.0–17.0)
MCH: 29.4 pg (ref 26.0–34.0)
MCHC: 32.6 g/dL (ref 30.0–36.0)
MCV: 90 fL (ref 80.0–100.0)
Platelets: 26 10*3/uL — CL (ref 150–400)
RBC: 2.69 MIL/uL — AB (ref 4.22–5.81)
RDW: 16.1 % — ABNORMAL HIGH (ref 11.5–15.5)
WBC: 1 10*3/uL — CL (ref 4.0–10.5)
nRBC: 0 % (ref 0.0–0.2)

## 2018-02-20 LAB — GLUCOSE, CAPILLARY
GLUCOSE-CAPILLARY: 133 mg/dL — AB (ref 70–99)
Glucose-Capillary: 157 mg/dL — ABNORMAL HIGH (ref 70–99)
Glucose-Capillary: 131 mg/dL — ABNORMAL HIGH (ref 70–99)

## 2018-02-20 LAB — MAGNESIUM: Magnesium: 1.7 mg/dL (ref 1.7–2.4)

## 2018-02-20 LAB — PHOSPHORUS: Phosphorus: 2.5 mg/dL (ref 2.5–4.6)

## 2018-02-20 MED ORDER — ADULT MULTIVITAMIN LIQUID CH
15.0000 mL | Freq: Every day | ORAL | Status: DC
  Filled 2018-02-20 (×2): qty 15

## 2018-02-20 MED ORDER — BOOST / RESOURCE BREEZE PO LIQD CUSTOM
1.0000 | Freq: Two times a day (BID) | ORAL | Status: DC
  Administered 2018-02-20: 1 via ORAL

## 2018-02-20 MED ORDER — ORAL CARE MOUTH RINSE
15.0000 mL | Freq: Two times a day (BID) | OROMUCOSAL | Status: DC
  Administered 2018-02-20: 15 mL via OROMUCOSAL

## 2018-02-20 MED ORDER — POTASSIUM CHLORIDE 10 MEQ/100ML IV SOLN
10.0000 meq | INTRAVENOUS | Status: AC
  Administered 2018-02-20 (×4): 10 meq via INTRAVENOUS
  Filled 2018-02-20 (×2): qty 100

## 2018-02-20 MED ORDER — MAGNESIUM SULFATE 2 GM/50ML IV SOLN
2.0000 g | Freq: Once | INTRAVENOUS | Status: AC
  Administered 2018-02-20: 2 g via INTRAVENOUS
  Filled 2018-02-20: qty 50

## 2018-02-20 MED ORDER — SODIUM CHLORIDE 0.9 % IV SOLN
INTRAVENOUS | Status: DC
  Administered 2018-02-20 – 2018-02-21 (×3): via INTRAVENOUS

## 2018-02-20 NOTE — Care Management Note (Signed)
Case Management Note  Patient Details  Name: Scott Gomez MRN: [id number] Date of Birth: 1934-04-15  Subjective/Objective: From home w/spouse. PT-recc HHPT.Patient in agreement with [facility] for HHPT-rep [facility] aware ,accepted,& following.                  Action/Plan:dc home w/HHC.   Expected Discharge Date:  (unknown)               Expected Discharge Plan:  [address]  In-House Referral:     Discharge planning Services  CM Consult  Post Acute Care Choice:  Durable Medical Equipment(rw) Choice offered to:  Patient  DME Arranged:    DME Agency:     HH Arranged:    HH Agency:  [facility]  Status of Service:  In process, will continue to follow  If discussed at Long Length of Stay Meetings, dates discussed:    Additional Comments:  Dessa Phi, RN 02/20/2018, 3:48 PM

## 2018-02-20 NOTE — Care Management Note (Signed)
Case Management Note  Patient Details  Name: Scott Gomez MRN: [id number] Date of Birth: 12-11-1934  Subjective/Objective:                    Action/Plan:   Expected Discharge Date:  (unknown)               Expected Discharge Plan:  Home/Self Care  In-House Referral:     Discharge planning Services  CM Consult  Post Acute Care Choice:    Choice offered to:     DME Arranged:    DME Agency:     HH Arranged:    [place] Agency:     Status of Service:  In process, will continue to follow  If discussed at Long Length of Stay Meetings, dates discussed:    Additional Comments: Per Sally/[facility] p#[phone] Eliquis is $131.00 and Apixaban is $47.00 for 30day supply no prio auth required.  Kerin Salen 02/20/2018, 2:20 PM

## 2018-02-20 NOTE — Care Management Note (Signed)
Case Management Note  Patient Details  Name: Scott Gomez MRN: [id number] Date of Birth: 11/11/1934  Subjective/Objective:  NSCLCA. FTT. From home. CM referral for affording meds. Patient states his eliquis is too expensive-will check benefit for formulary med covered.  PT cons-await recc.                Action/Plan:dc plan home.   Expected Discharge Date:  (unknown)               Expected Discharge Plan:  Home/Self Care  In-House Referral:     Discharge planning Services  CM Consult  Post Acute Care Choice:    Choice offered to:     DME Arranged:    DME Agency:     HH Arranged:    HH Agency:     Status of Service:  In process, will continue to follow  If discussed at Long Length of Stay Meetings, dates discussed:    Additional Comments:  Dessa Phi, RN 02/20/2018, 1:51 PM

## 2018-02-20 NOTE — Evaluation (Signed)
Physical Therapy Evaluation Patient Details Name: Scott Gomez MRN: [id number] DOB: 12/20/1934 Today's Date: 02/20/2018   History of Present Illness  84 yo male admitted with FTT, N/V, dehydration, weakness. Hx of SCLC, COPD, OSA, DVT, DM, OA, thoracic AA.   Clinical Impression  On eval, pt required Min assist for mobility. He walked ~25 feet with a RW. Pt presents with general weakness, decreased activity tolerance, and impaired gait and balance. No family was present during session. Discussed d/c plan-pt stated he will return home with wife assisting as needed. Will follow during hospital stay.     Follow Up Recommendations Home health PT;Supervision/Assistance - 24 hour    Equipment Recommendations  None recommended by PT    Recommendations for Other Services       Precautions / Restrictions Precautions Precautions: Fall Restrictions Weight Bearing Restrictions: No      Mobility  Bed Mobility Overal bed mobility: Needs Assistance Bed Mobility: Supine to Sit;Sit to Supine     Supine to sit: Supervision;HOB elevated Sit to supine: Supervision;HOB elevated   General bed mobility comments: for safety  Transfers Overall transfer level: Needs assistance Equipment used: Rolling walker (2 wheeled) Transfers: Sit to/from Stand Sit to Stand: Min assist         General transfer comment: Assist to rise, stabilize, control descent. VCs safety, hand placement  Ambulation/Gait Ambulation/Gait assistance: Min assist Gait Distance (Feet): 25 Feet Assistive device: Rolling walker (2 wheeled) Gait Pattern/deviations: Trunk flexed;Decreased stride length     General Gait Details: Assist to stabilize thorughout distance. VCs safety.   Stairs            Wheelchair Mobility    Modified Rankin (Stroke Patients Only)       Balance Overall balance assessment: Mild deficits observed, not formally tested                                            Pertinent Vitals/Pain Pain Assessment: No/denies pain    Home Living Family/patient expects to be discharged to:: Private residence Living Arrangements: Spouse/significant other Available Help at Discharge: Family Type of Home: House Home Access: [place]: One [place]: Environmental consultant - 2 wheels;Cane - single point      Prior Function Level of Independence: Independent with assistive device(s)               Hand Dominance        Extremity/Trunk Assessment   Upper Extremity Assessment Upper Extremity Assessment: Generalized weakness    Lower Extremity Assessment Lower Extremity Assessment: Generalized weakness    Cervical / Trunk Assessment Cervical / Trunk Assessment: Kyphotic  Communication   Communication: Expressive difficulties  Cognition Arousal/Alertness: Awake/alert Behavior During Therapy: WFL for tasks assessed/performed Overall Cognitive Status: Within Functional Limits for tasks assessed                                        General Comments      Exercises     Assessment/Plan    PT Assessment Patient needs continued PT services  PT Problem List Decreased strength;Decreased balance;Decreased mobility;Decreased activity tolerance;Decreased knowledge of use of DME       PT Treatment Interventions DME instruction;Gait training;Functional mobility training;Therapeutic activities;Balance training;Patient/family education;Therapeutic exercise  PT Goals (Current goals can be found in the Care Plan section)  Acute Rehab PT Goals Patient Stated Goal: to get stronger PT Goal Formulation: With patient Time For Goal Achievement: 03/06/18 Potential to Achieve Goals: Fair    Frequency Min 3X/week   Barriers to discharge        Co-evaluation               AM-PAC PT "6 Clicks" Mobility  Outcome Measure Help needed turning from your back to your side while in a flat bed without using  bedrails?: A Little Help needed moving from lying on your back to sitting on the side of a flat bed without using bedrails?: A Little Help needed moving to and from a bed to a chair (including a wheelchair)?: A Little Help needed standing up from a chair using your arms (e.g., wheelchair or bedside chair)?: A Little Help needed to walk in hospital room?: A Little Help needed climbing 3-5 steps with a railing? : A Little 6 Click Score: 18    End of Session Equipment Utilized During Treatment: Gait belt Activity Tolerance: Patient tolerated treatment well Patient left: in bed;with call bell/phone within reach;with bed alarm set   PT Visit Diagnosis: Unsteadiness on feet (R26.81);Muscle weakness (generalized) (M62.81)    Time: 1410-1430 PT Time Calculation (min) (ACUTE ONLY): 20 min   Charges:   PT Evaluation $PT Eval Moderate Complexity: [place], PT Acute Rehabilitation Services Pager: [phone] Office: [phone]

## 2018-02-20 NOTE — Progress Notes (Signed)
PROGRESS NOTE    Scott Gomez  EUM:[id number] DOB: 16-Sep-1934 DOA: 02/19/2018 PCP: Abner Greenspan, MD   Brief Narrative:  Scott Gomez is a 84 y.o. 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 He received Neulasta injection on 1/31. Since initiating chemotherapy in November of 2019, he has lost approximately 50 pounds. He had been unable to eat food due to chemotherapy-related mucositis and poor appetite. He feels that his weakness and unsteadiness has worsened in the last 3-4 days.   Assessment & Plan:   Active Problems:   GERD (gastroesophageal reflux disease)   Hypomagnesemia   Failure to thrive in adult   Protein-calorie malnutrition, severe  Failure to thrive probably secondary to persistent nausea and vomiting associated with mucositis from chemotherapy Patient has 50 pound weight loss since starting chemo as per the family.  Patient is getting etoposide/carboplatin for small cell lung cancer. Continue with IV fluids and supportive management for nausea and vomiting.  Magic mouthwash for mucositis. Nutritionist consulted for supplementation and possible NG tube for tube feeds.  But patient and family very resistant to NG tube.  And with his low platelet count, would not recommend any NG tube placement at this time. Discussed with Dr. Julien Nordmann and see below.  New onset atrial fibrillation, paroxysmal Rate controlled and patient is already on Eliquis for history of DVT in the past. Echocardiogram ordered.  Recommend outpatient follow-up with cardiologist.    Limited stage small cell lung cancer on  chemotherapy Follow-up with Dr. Julien Nordmann as outpatient.   Pancytopenia secondary to recent chemotherapy Transfuse to keep hemoglobin greater than 7 and platelets greater than 10,000.  Patient received Neulasta on February 13, 2018. Watch for neutropenic fever    History of prior DVT on Eliquis Eliquis discontinued in January due to cost and currently he is thrombocytopenic.  Continue to hold Eliquis at this time.   History of ascending thoracic aortic aneurysm Recommend outpatient follow-up with vascular studies.   History of COPD No wheezing heard Continue with duo nebs.  Hypokalemia, hypomagnesemia, hypophosphatemia Replace as needed.   Depression Continue with Paxil   Anxiety Continue with Xanax  DVT prophylaxis: SCD'S Code Status: dnr  Family Communication:family at bedside.  Disposition Plan: Pending improvement in neutrophil count Consultants:   Oncology Dr. Julien Nordmann discussed over the phone, recommended watching his counts, hydrate and nutrition support. No further chemotherapy sessions and outpatient follow up with Dr Julien Nordmann.   Nutritionist  Procedures: None Antimicrobials: None  Subjective: Pt feels weak, and wife at bedside, wanted to talk to oncologist about holding off any more chemo therapy.  They do not want PEG tube or NG tube placement for nutrition.  No chest pain or sob.  Nauseated , vomiting this am.  No abdominal pain.   Objective: Vitals:   02/20/18 0442 02/20/18 0649 02/20/18 1038 02/20/18 1050  BP: 115/78   139/83  Pulse: 71   92  Resp: 17     Temp: 98.4 F (36.9 C)     TempSrc: Oral     SpO2: 98%  95%   Weight:  77.5 kg    Height:        Intake/Output Summary (Last  24 hours) at 02/20/2018 1407 Last data filed at 02/20/2018 0600 Gross per 24 hour  Intake 2668.36 ml  Output -  Net 2668.36 ml   Filed Weights   02/19/18 1220 02/20/18 0649  Weight: 78.5 kg 77.5 kg    Examination:  General exam: cachetic appearing, chronic  ill appearing. Not in distress.  Respiratory system: Clear to auscultation. Respiratory effort normal. Cardiovascular system: S1 & S2 heard,irregular,  No JVD Gastrointestinal system: Abdomen is nondistended, soft and nontender. . Normal bowel sounds heard. Central nervous system: Alert and oriented.to person and place.  Extremities: Trace edema pedal edema.  Skin: No rashes, lesions or ulcers Psychiatry:  Mood & affect appropriate.     Data Reviewed: I have personally reviewed following labs and imaging studies  CBC: Recent Labs  Lab 02/16/18 1132 02/19/18 1326 02/20/18 0430  WBC 10.8* 1.5* 1.0*  NEUTROABS 9.8 1.1*  --   HGB 10.3* 8.9* 7.9*  HCT 30.3* 27.6* 24.2*  MCV 86.1 87.3 90.0  PLT 108* 38* 26*   Basic Metabolic Panel: Recent Labs  Lab 02/16/18 1132 02/19/18 1326 02/19/18 2013 02/20/18 0430  NA 133* 132*  --  132*  K 2.9* 2.9*  --  3.1*  CL 94* 95*  --  100  CO2 31 27  --  26  GLUCOSE 116* 141*  --  98  BUN 17 14  --  12  CREATININE 0.99 0.89  --  0.82  CALCIUM 8.4* 8.4*  --  8.0*  MG 1.4*  --  1.5* 1.7  PHOS  --   --  2.3*  --    GFR: Estimated Creatinine Clearance: 68.4 mL/min (by C-G formula based on SCr of 0.82 mg/dL). Liver Function Tests: Recent Labs  Lab 02/16/18 1132 02/19/18 1326  AST 17 13*  ALT 8 11  ALKPHOS 91 60  BILITOT 0.7 0.9  PROT 5.7* 5.9*  ALBUMIN 3.2* 3.4*   No results for input(s): LIPASE, AMYLASE in the last 168 hours. No results for input(s): AMMONIA in the last 168 hours. Coagulation Profile: Recent Labs  Lab 02/19/18 1326  INR 1.25   Cardiac Enzymes: No results for input(s): CKTOTAL, CKMB, CKMBINDEX, TROPONINI in the last 168 hours. BNP (last 3 results) No results for input(s): PROBNP in the last 8760 hours. HbA1C: No results for input(s): HGBA1C in the last 72 hours. CBG: Recent Labs  Lab 02/19/18 2038 02/20/18 1146 02/20/18 1354  GLUCAP 88 157* 133*   Lipid Profile: No results for input(s): CHOL, HDL,  LDLCALC, TRIG, CHOLHDL, LDLDIRECT in the last 72 hours. Thyroid Function Tests: No results for input(s): TSH, T4TOTAL, FREET4, T3FREE, THYROIDAB in the last 72 hours. Anemia Panel: No results for input(s): VITAMINB12, FOLATE, FERRITIN, TIBC, IRON, RETICCTPCT in the last 72 hours. Sepsis Labs: Recent Labs  Lab 02/19/18 1323 02/19/18 1737  LATICACIDVEN 1.1 0.7    Recent Results (from the past 240 hour(s))  Culture, Blood     Status: None   Collection Time: 02/12/18 10:15 AM  Result Value Ref Range Status   Specimen Description BLOOD PORTA CATH  Final   Special Requests   Final    BOTTLES DRAWN AEROBIC AND ANAEROBIC Blood Culture adequate volume   Culture   Final    NO GROWTH 5 DAYS Performed at [facility], [address], [place], [place] [address]    Report Status 02/17/2018 FINAL  Final  Culture, Blood     Status: None   Collection Time: 02/12/18 10:29 AM  Result Value Ref Range Status   Specimen Description   Final    BLOOD LEFT ARM Performed at [facility], [place] [address] [place], [place] [address]    Special Requests   Final    BOTTLES DRAWN AEROBIC AND ANAEROBIC Blood Culture results may not be optimal due to an excessive volume of blood received in culture bottles   Culture   Final    NO GROWTH 5 DAYS Performed at [facility], [place] [address] [place], [place] [address]    Report Status 02/17/2018 FINAL  Final  Culture, blood (Routine x 2)     Status: None (Preliminary result)   Collection Time: 02/19/18 12:46 PM  Result Value Ref Range Status   Specimen Description   Final    BLOOD LEFT CHEST PORTA CATH Performed at [facility], [place] [address], [place], [place] [address]    Special Requests   Final    BOTTLES DRAWN AEROBIC AND ANAEROBIC Blood Culture adequate volume Performed at [place] [address] [place], [place] [address]    Culture   Final    NO GROWTH < 24 HOURS Performed at  [place] [address], [place], [place] [address]    Report Status PENDING  Incomplete  Culture, blood (Routine x 2)     Status: None (Preliminary result)   Collection Time: 02/19/18 12:46 PM  Result Value Ref Range Status   Specimen Description   Final    BLOOD LEFT ANTECUBITAL Performed at [place] [address], [place], [place] [address]    Special Requests   Final    BOTTLES DRAWN AEROBIC AND ANAEROBIC Blood Culture adequate volume Performed at [place] [address] [place], [place] [address]    Culture   Final    NO GROWTH < 24 HOURS Performed at [place] [address], [place], [place] [address]    Report Status PENDING  Incomplete         Radiology Studies: No results found.      Scheduled Meds: . docusate sodium  100 mg Oral Daily  . feeding supplement  1 Container Oral BID BM  . gabapentin  200 mg Oral TID  . heparin lock flush  500 Units Intracatheter Once  . insulin aspart  0-9 Units Subcutaneous TID WC  . ipratropium-albuterol  3 mL Nebulization TID  . latanoprost  1 drop Both Eyes QHS  . lubiprostone  24 mcg Oral BID WC  . magic mouthwash  5 mL Oral QID  . mouth rinse  15 mL Mouth Rinse BID  . metoprolol tartrate  12.5 mg Oral BID  . multivitamin  15 mL Oral Daily  . pantoprazole  40 mg Oral BID  . PARoxetine  10 mg Oral Daily  . [START ON 02/21/2018] scopolamine  1 patch Transdermal Q72H  . sodium chloride flush  3 mL Intravenous Q12H  . sucralfate  1 g Oral TID WC & HS  . vitamin B-12  1,000 mcg Oral Daily  . zinc oxide   Topical BID   Continuous Infusions: . potassium chloride 10 mEq (02/20/18 1346)     LOS: 1 day    Time spent: 32 minutes    Hosie Poisson, MD [facility] Pager [phone]  If 7PM-7AM, please contact night-coverage [email or web address] Password [email or web address] 02/20/2018, 2:07 PM

## 2018-02-20 NOTE — Progress Notes (Signed)
CRITICAL VALUE ALERT  Critical Value: WBC 1.0  Date & Time Notied:  02/20/2018 0829  Provider Notified: Gaetano Net Orders Received/Actions taken: Awaiting orders. Will continue to monitor. Protective precautions posted at door

## 2018-02-20 NOTE — Progress Notes (Signed)
  Echocardiogram 2D Echocardiogram has been performed.  [name] M 02/20/2018, 8:37 AM

## 2018-02-20 NOTE — Progress Notes (Signed)
Patient's co pay for apixaban is $47 vs Eliqus cost$131. Patient voiced understanding.
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
## 2018-02-21 DIAGNOSIS — K219 Gastro-esophageal reflux disease without esophagitis: Secondary | ICD-10-CM

## 2018-02-21 LAB — CBC WITH DIFFERENTIAL/PLATELET
Abs Immature Granulocytes: 0.17 10*3/uL — ABNORMAL HIGH (ref 0.00–0.07)
Basophils Absolute: 0 10*3/uL (ref 0.0–0.1)
Basophils Relative: 1 %
Eosinophils Absolute: 0 10*3/uL (ref 0.0–0.5)
Eosinophils Relative: 0 %
HCT: 23.2 % — ABNORMAL LOW (ref 39.0–52.0)
Hemoglobin: 7.6 g/dL — ABNORMAL LOW (ref 13.0–17.0)
Immature Granulocytes: 18 %
LYMPHS PCT: 16 %
Lymphs Abs: 0.2 10*3/uL — ABNORMAL LOW (ref 0.7–4.0)
MCH: 28.8 pg (ref 26.0–34.0)
MCHC: 32.8 g/dL (ref 30.0–36.0)
MCV: 87.9 fL (ref 80.0–100.0)
MONOS PCT: 36 %
Monocytes Absolute: 0.4 10*3/uL (ref 0.1–1.0)
Neutro Abs: 0.3 10*3/uL — ABNORMAL LOW (ref 1.7–7.7)
Neutrophils Relative %: 29 %
Platelets: 26 10*3/uL — CL (ref 150–400)
RBC: 2.64 MIL/uL — ABNORMAL LOW (ref 4.22–5.81)
RDW: 16.1 % — ABNORMAL HIGH (ref 11.5–15.5)
WBC: 1 10*3/uL — CL (ref 4.0–10.5)
nRBC: 0 % (ref 0.0–0.2)

## 2018-02-21 LAB — COMPREHENSIVE METABOLIC PANEL
ALT: 9 U/L (ref 0–44)
AST: 12 U/L — ABNORMAL LOW (ref 15–41)
Albumin: 3.1 g/dL — ABNORMAL LOW (ref 3.5–5.0)
Alkaline Phosphatase: 52 U/L (ref 38–126)
Anion gap: 7 (ref 5–15)
BUN: 8 mg/dL (ref 8–23)
CHLORIDE: 99 mmol/L (ref 98–111)
CO2: 26 mmol/L (ref 22–32)
Calcium: 8.2 mg/dL — ABNORMAL LOW (ref 8.9–10.3)
Creatinine, Ser: 0.79 mg/dL (ref 0.61–1.24)
GFR calc Af Amer: >60 mL/min (ref >60)
GFR calc non Af Amer: >60 mL/min (ref >60)
Glucose, Bld: 108 mg/dL — ABNORMAL HIGH (ref 70–99)
POTASSIUM: 3.2 mmol/L — AB (ref 3.5–5.1)
Sodium: 132 mmol/L — ABNORMAL LOW (ref 135–145)
Total Bilirubin: 0.6 mg/dL (ref 0.3–1.2)
Total Protein: 5.4 g/dL — ABNORMAL LOW (ref 6.5–8.1)

## 2018-02-21 LAB — GLUCOSE, CAPILLARY: Glucose-Capillary: 120 mg/dL — ABNORMAL HIGH (ref 70–99)

## 2018-02-21 LAB — MAGNESIUM: Magnesium: 1.8 mg/dL (ref 1.7–2.4)

## 2018-02-21 LAB — PHOSPHORUS: Phosphorus: 2.2 mg/dL — ABNORMAL LOW (ref 2.5–4.6)

## 2018-02-21 MED ORDER — PROCHLORPERAZINE MALEATE 10 MG PO TABS
10.0000 mg | ORAL_TABLET | Freq: Four times a day (QID) | ORAL | Status: DC | PRN
  Administered 2018-02-21: 10 mg via ORAL
  Filled 2018-02-21: qty 1

## 2018-02-21 MED ORDER — HEPARIN SOD (PORK) LOCK FLUSH 100 UNIT/ML IV SOLN: 500.0000 [IU] | INTRAVENOUS | Status: DC | PRN

## 2018-02-21 MED ORDER — APIXABAN 5 MG PO TABS: 5.0000 mg | ORAL_TABLET | Freq: Two times a day (BID) | ORAL | 0 refills | Status: DC

## 2018-02-21 MED ORDER — METOPROLOL TARTRATE 25 MG PO TABS: 12.5000 mg | ORAL_TABLET | Freq: Two times a day (BID) | ORAL | 0 refills | Status: AC

## 2018-02-21 MED ORDER — IPRATROPIUM-ALBUTEROL 0.5-2.5 (3) MG/3ML IN SOLN: 3.0000 mL | Freq: Two times a day (BID) | RESPIRATORY_TRACT | Status: DC

## 2018-02-21 MED ORDER — TBO-FILGRASTIM 480 MCG/0.8ML ~~LOC~~ SOSY
480.0000 ug | PREFILLED_SYRINGE | Freq: Once | SUBCUTANEOUS | Status: AC
  Administered 2018-02-21: 480 ug via SUBCUTANEOUS
  Filled 2018-02-21: qty 0.8

## 2018-02-21 NOTE — Progress Notes (Signed)
Reviewed discharge information with patient and caregiver. Answered all questions. Patient/caregiver able to teach back medications and reasons to contact MD/911. Patient verbalizes importance of PCP follow up appointment.  Bertram Haddix M. Berneice Zettlemoyer, RN  

## 2018-02-21 NOTE — Progress Notes (Signed)
CRITICAL VALUE ALERT  Critical Value: WBC 1.0 & Platelets 26  Date & Time Notied:  06/22/18 @ 0097  Provider Notified: Kennon Holter  Orders Received/Actions taken: waiting on orders

## 2018-02-21 NOTE — Care Management Note (Signed)
Case Management Note  Patient Details  Name: AVYN COATE MRN: [id number] Date of Birth: [birth year]  Subjective/Objective:   FTT,  SCLC                Action/Plan: NCM spoke to pt and wife at bedside. Explained [facility] will call to arrange appt for HHPT. Pt has cane, RW, neb machine and bedside commode. States home is handicap accessible. Dtr and wife will be at home to assist with care. Provided pt with an Eliquis 30 day free trial card. Explained if he has used in the past, he will not be able to use again. Wife states they completed a patient assistance application in the past and was denied assistance.   Expected Discharge Date:  02/21/18               Expected Discharge Plan:  [place]  In-House Referral:  NA  Discharge planning Services  CM Consult  Post Acute Care Choice:  Durable Medical Equipment, Home Health(rw) Choice offered to:  Patient  DME Arranged:  N/A DME Agency:  NA  HH Arranged:  PT HH Agency:  [place]  Status of Service:  Completed, signed off  If discussed at [place] of Stay Meetings, dates discussed:    Additional Comments:  Erenest Rasher, RN 02/21/2018, 1:23 PM

## 2018-02-21 NOTE — Discharge Summary (Signed)
Physician Discharge Summary  Scott Gomez NMM:[id number] DOB: December 18, 1934 DOA: 02/19/2018  PCP: Abner Greenspan, MD  Admit date: 02/19/2018 Discharge date: 02/21/2018  Admitted From: Home Disposition: Home   Recommendations for Outpatient Follow-up:  1. Follow up with PCP in 1-2 weeks 2. Follow up with cardiology as an outpatient for new onset AFib, given 30-day card for eliquis though with risks of bleeding due to thrombocytopenia, this risk is felt to be prohibitive despite his risk of stroke. Eliquis is not to be taken until platelet count rebounds and discussed with outpatient providers. This was discussed in detail with the patient's wife. 3. Follow up CBC w/diff on 2/11 4. Follow up with pulmonology 2/12  Home Health: PT Equipment/Devices: None new Discharge Condition: Stable CODE STATUS: DNR Diet recommendation: As tolerated  Brief/Interim Summary: HPI: Scott Gomez a 83 y.o.malewith medical history significant forlimited stage SCLC on cisplatin/etoposide and recently completed radiation therapy, moderately severe COPD (FEV1 50% pred), OSA non-compliant with CPAP, DVT in November 2019 previously on Eliquis (cannot afford medication) and DM2 presenting to the ED with weakness, nausea, vomiting and dehydration for the last week following his 4th cycle of chemotherapy completed on 1/30. He received Neulasta injection on 1/31.Since initiating chemotherapy in November of 2019, he has lost approximately 50 pounds. He had been unable to eat food due to chemotherapy-related mucositis and poor appetite. He feels that his weakness and unsteadiness has worsened in the last 3-4 days.  Course: He was found to be in new onset atrial fibrillation, though rate was controlled. Due to pancytopenia, granix was administered. Pt's po intake improved with pain control and nausea control. On the day of discharge the patient requests to go home as he feels better.   Discharge Diagnoses:  Active  Problems:   GERD (gastroesophageal reflux disease)   Hypomagnesemia   Failure to thrive in adult   Protein-calorie malnutrition, severe  Failure to thrive probably secondary to persistent nausea and vomiting associated with mucositis from chemotherapy: Patient has 50 pound weight loss since starting chemo as per the family.   - Improved with supportive management for nausea and vomiting.  - Magic mouthwash for mucositis. - Discussed with Dr. Julien Nordmann at admission, see below.  New onset atrial fibrillation, paroxysmal: - Rate controlled.  - Previously on eliquis for history of DVT. Discussed risks of bleeding with patient and wife. With thrombocytopenia, will not restart at this time. But did provide with 30-day card in the event that he does decide to start anticoagulation.  - Will need cardiology follow up, to be scheduled this coming week.  - Echo showed LVEF 60-65%, no LVH, normal RV, severe LAE.   Limited stage small cell lung cancer on chemotherapy (etoposide/carboplatin): - Considering discontinuation of therapy given his poor tolerance. - Follow-up with Dr. Julien Nordmann as outpatient.  Pancytopenia secondary to recent chemotherapy:  - Discussed with Dr. Marin Olp, recommended to give granix prior to discharge. No fever. Has close lab follow up previously scheduled.   Thrombocytopenia: As recently as 1/30 platelets normal, anticipate rebound in platelet count. Plt's stable while admitted.  History of prior DVT on eliquis - Eliquis discontinued in January due to cost.  History of ascending thoracic aortic aneurysm - Recommend outpatient follow-up with vascular studies.  COPD: Stable.  Hypokalemia, hypomagnesemia, hypophosphatemia - Replaced as needed.  Depression - Continue with Paxil  Anxiety - Continue with Xanax  Discharge Instructions Discharge Instructions    Discharge instructions   Complete by:  As directed  You were admitted for weakness and inability to  take food/drink by mouth which has improved. You are stable for discharge with the following recommendations:  - You were found to have an irregular heart rhythm called atrial fibrillation (A Fib) which increases your risk of stroke. To reduce this risk, you would normally take a blood thinner, though your platelets are too low to take this currently. You will get a 30-day card for eliquis from care management and will need to address blood thinners with your doctors going forward.  - You should be contacted to schedule follow up with the cardiology clinic, but if you don't receive a call, please call the number listed below.  - Follow up for labs and follow up as previously scheduled.   Increase activity slowly   Complete by:  As directed      Allergies as of 02/21/2018      Reactions   Buspirone Hcl    REACTION: itching   Crestor [rosuvastatin Calcium]    Increased his blood sugar    Prednisone    REACTION: sick, per wife he can tolerate for a few days and then stomach upset occurs.       Medication List    TAKE these medications   albuterol 108 (90 Base) MCG/ACT inhaler Commonly known as:  PROAIR HFA Inhale 2 puffs into the lungs every 4 (four) hours as needed for wheezing or shortness of breath.   ALPRAZolam 0.5 MG tablet Commonly known as:  XANAX TAKE ONE (1) TABLET BY MOUTH TWO (2) TIMES DAILY AS NEEDED FOR ANXIETY What changed:  See the new instructions.   apixaban 5 MG Tabs tablet Commonly known as:  ELIQUIS Take 1 tablet (5 mg total) by mouth 2 (two) times daily.   cyclobenzaprine 10 MG tablet Commonly known as:  FLEXERIL Take 10 mg by mouth 2 (two) times daily.   docusate sodium 100 MG capsule Commonly known as:  COLACE Take 100 mg by mouth daily.   gabapentin 100 MG capsule Commonly known as:  NEURONTIN Take 200 mg by mouth 3 (three) times daily.   glucose blood test strip Commonly known as:  ONETOUCH VERIO USE TO CHECK BLOOD SUGAR ONCE DAILY   glucose blood  test strip Use as instructed   hyoscyamine 0.125 MG SL tablet Commonly known as:  LEVSIN SL Place 1 tablet (0.125 mg total) under the tongue every 12 (twelve) hours as needed. What changed:  reasons to take this   levalbuterol 0.63 MG/3ML nebulizer solution Commonly known as:  XOPENEX Take 3 mLs (0.63 mg total) by nebulization every 4 (four) hours as needed for wheezing or shortness of breath.   lidocaine-prilocaine cream Commonly known as:  EMLA Apply to port 1 hour prior port access   lisinopril 10 MG tablet Commonly known as:  PRINIVIL,ZESTRIL Take 1 tablet (10 mg total) by mouth daily.   lubiprostone 24 MCG capsule Commonly known as:  AMITIZA Take 1 capsule (24 mcg total) by mouth 2 (two) times daily with a meal.   magnesium oxide 400 (241.3 Mg) MG tablet Commonly known as:  MAG-OX Take 1 tablet (400 mg total) by mouth daily.   metoprolol tartrate 25 MG tablet Commonly known as:  LOPRESSOR Take 0.5 tablets (12.5 mg total) by mouth 2 (two) times daily.   ondansetron 8 MG tablet Commonly known as:  ZOFRAN Take 1 tablet (8 mg total) by mouth every 8 (eight) hours as needed for nausea or vomiting.   ONE TOUCH  LANCETS Misc Use to test blood sugar once daily and as needed. Dx 790.29   ONETOUCH VERIO IQ SYSTEM w/Device Kit Use to test blood sugar once daily and as needed dx R73.9   Oxycodone HCl 10 MG Tabs Take 10 mg by mouth every 6 (six) hours as needed (pain).   pantoprazole 40 MG tablet Commonly known as:  PROTONIX Take 1 tablet (40 mg total) by mouth 2 (two) times daily.   PARoxetine 10 MG tablet Commonly known as:  PAXIL Take 1 tablet (10 mg total) by mouth daily.   potassium chloride 10 MEQ CR capsule Commonly known as:  MICRO-K Take 20 mEq by mouth 2 (two) times daily.   potassium chloride SA 20 MEQ tablet Commonly known as:  K-DUR,KLOR-CON TAKE ONE (1) TABLET BY MOUTH EVERY DAY What changed:    how much to take  how to take this  when to take  this  additional instructions   potassium chloride SA 20 MEQ tablet Commonly known as:  K-DUR,KLOR-CON Take 1 tablet (20 mEq total) by mouth 2 (two) times daily for 10 days. What changed:  Another medication with the same name was changed. Make sure you understand how and when to take each.   prochlorperazine 10 MG tablet Commonly known as:  COMPAZINE TAKE 1 TABLET (10 MG TOTAL) BY MOUTH EVERY 6 (SIX) HOURS AS NEEDED FOR NAUSEA OR VOMITING.   scopolamine 1 MG/3DAYS Commonly known as:  TRANSDERM-SCOP Place 1 patch (1.5 mg total) onto the skin every 3 (three) days.   sucralfate 1 g tablet Commonly known as:  CARAFATE Take 1 tablet (1 g total) by mouth 4 (four) times daily -  with meals and at bedtime. 5 min before meals for radiation induced esophagitis   Tiotropium Bromide Monohydrate 2.5 MCG/ACT Aers Commonly known as:  SPIRIVA RESPIMAT Inhale 2 puffs into the lungs daily.   travoprost (benzalkonium) 0.004 % ophthalmic solution Commonly known as:  TRAVATAN Place 1 drop into both eyes at bedtime.   vitamin B-12 1000 MCG tablet Commonly known as:  CYANOCOBALAMIN Take 1,000 mcg by mouth daily.      Follow-up Information    [facility].   Contact information: [place] [address] [phone]       Go to  [facility] DEPT.   Specialty:  Emergency Medicine Why:  Return to ER for new or worsening symptoms Contact information: [place] [id number] [place] .   Contact information: [place] [address] [phone]        Care, [facility] Follow up.   Why:  [facility] Contact information: [place] [address] [phone]          Allergies  Allergen Reactions  . Buspirone Hcl     REACTION: itching  . Crestor  [Rosuvastatin Calcium]     Increased his blood sugar   . Prednisone     REACTION: sick, per wife he can tolerate for a few days and then stomach upset occurs.     Consultations: Oncology, Dr. Marin Olp by phone only  Procedures/Studies: Dg Chest 2 View  Result Date: 02/12/2018 CLINICAL DATA:  Weakness.  Small cell lung cancer EXAM: CHEST - 2 VIEW COMPARISON:  01/20/2018 chest CT FINDINGS: Borderline heart size. A small pericardial effusion with present by CT. Stable mediastinal contours. Porta  catheter from the left with tip at the upper cavoatrial junction. Hyperinflation. There is no edema, consolidation, effusion, or pneumothorax. Remote L1 compression fracture. IMPRESSION: No evidence of active disease. Electronically Signed   By: Monte Fantasia M.D.   On: 02/12/2018 11:23    Subjective: Feels better, no weakness. Eating/drinking, ambulating at baseline. Wants to go home.  Discharge Exam: Vitals:   02/20/18 2142 02/21/18 0435  BP:  107/69  Pulse:  79  Resp:  13  Temp:  98.4 F (36.9 C)  SpO2: 98% 98%   General: Pt is alert, awake, not in acute distress Cardiovascular: Irreg, rate controlled, S1/S2 +, no rubs, no gallops Respiratory: CTA bilaterally, no wheezing, no rhonchi Abdominal: Soft, NT, ND, bowel sounds + Extremities: No edema, no cyanosis  Labs: BNP (last 3 results) No results for input(s): BNP in the last 8760 hours. Basic Metabolic Panel: Recent Labs  Lab 02/16/18 1132 02/19/18 1326 02/19/18 2013 02/20/18 0430 02/21/18 0345  NA 133* 132*  --  132* 132*  K 2.9* 2.9*  --  3.1* 3.2*  CL 94* 95*  --  100 99  CO2 31 27  --  26 26  GLUCOSE 116* 141*  --  98 108*  BUN 17 14  --  12 8  CREATININE 0.99 0.89  --  0.82 0.79  CALCIUM 8.4* 8.4*  --  8.0* 8.2*  MG 1.4*  --  1.5* 1.7 1.8  PHOS  --   --  2.3* 2.5 2.2*   Liver Function Tests: Recent Labs  Lab 02/16/18 1132 02/19/18 1326 02/21/18 0345  AST 17 13* 12*  ALT '8 11 9  ' ALKPHOS 91 60 52  BILITOT  0.7 0.9 0.6  PROT 5.7* 5.9* 5.4*  ALBUMIN 3.2* 3.4* 3.1*   No results for input(s): LIPASE, AMYLASE in the last 168 hours. No results for input(s): AMMONIA in the last 168 hours. CBC: Recent Labs  Lab 02/16/18 1132 02/19/18 1326 02/20/18 0430 02/21/18 0345  WBC 10.8* 1.5* 1.0* 1.0*  NEUTROABS 9.8 1.1*  --  0.3*  HGB 10.3* 8.9* 7.9* 7.6*  HCT 30.3* 27.6* 24.2* 23.2*  MCV 86.1 87.3 90.0 87.9  PLT 108* 38* 26* 26*   Cardiac Enzymes: No results for input(s): CKTOTAL, CKMB, CKMBINDEX, TROPONINI in the last 168 hours. BNP: Invalid input(s): POCBNP CBG: Recent Labs  Lab 02/19/18 2038 02/20/18 1146 02/20/18 1354 02/20/18 1635 02/21/18 1132  GLUCAP 88 157* 133* 131* 120*   D-Dimer No results for input(s): DDIMER in the last 72 hours. Hgb A1c No results for input(s): HGBA1C in the last 72 hours. Lipid Profile No results for input(s): CHOL, HDL, LDLCALC, TRIG, CHOLHDL, LDLDIRECT in the last 72 hours. Thyroid function studies No results for input(s): TSH, T4TOTAL, T3FREE, THYROIDAB in the last 72 hours.  Invalid input(s): FREET3 Anemia work up No results for input(s): VITAMINB12, FOLATE, FERRITIN, TIBC, IRON, RETICCTPCT in the last 72 hours. Urinalysis    Component Value Date/Time   COLORURINE YELLOW 02/19/2018 [place] 02/19/2018 1457   LABSPEC 1.011 02/19/2018 1457   PHURINE 8.0 02/19/2018 1457   GLUCOSEU 50 (A) 02/19/2018 1457   HGBUR NEGATIVE 02/19/2018 [place] 02/19/2018 1457   BILIRUBINUR negative 11/25/2012 1312   KETONESUR 5 (A) 02/19/2018 1457   PROTEINUR 30 (A) 02/19/2018 1457   UROBILINOGEN 0.2 11/25/2012 1312   UROBILINOGEN 2.0 (H) 12/31/2010 2219   NITRITE NEGATIVE 02/19/2018 [place] 02/19/2018 1457    Microbiology  Recent Results (from the past 240 hour(s))  Culture, blood (Routine x 2)     Status: None (Preliminary result)   Collection Time: 02/19/18 12:46 PM  Result Value Ref Range Status    Specimen Description   Final    BLOOD LEFT CHEST PORTA CATH Performed at [facility], [address] [place], [place] [address]    Special Requests   Final    BOTTLES DRAWN AEROBIC AND ANAEROBIC Blood Culture adequate volume Performed at [facility] [address], [place], [place] [address]    Culture   Final    NO GROWTH 2 DAYS Performed at [facility] [address] [place], [place] [address]    Report Status PENDING  Incomplete  Culture, blood (Routine x 2)     Status: None (Preliminary result)   Collection Time: 02/19/18 12:46 PM  Result Value Ref Range Status   Specimen Description   Final    BLOOD LEFT ANTECUBITAL Performed at [facility] [address] [place], [place] [address]    Special Requests   Final    BOTTLES DRAWN AEROBIC AND ANAEROBIC Blood Culture adequate volume Performed at [facility] [address] [place], [place] [address]    Culture   Final    NO GROWTH 2 DAYS Performed at [facility] [address], [place], [place] [address]    Report Status PENDING  Incomplete    Time coordinating discharge: Approximately 40 minutes  Patrecia Pour, MD  [facility] 02/22/2018, 3:27 PM Pager [phone]

## 2018-02-23 ENCOUNTER — Telehealth: Payer: Self-pay | Admitting: Family Medicine

## 2018-02-23 NOTE — Telephone Encounter (Signed)
Transition Care Management Follow-up Telephone Call  Admit date: 02/19/2018 Discharge date: 02/21/2018  Admitted From: Home Disposition: Home   Recommendations for Outpatient Follow-up:  1. Follow up with PCP in 1-2 weeks 2. Follow up with cardiology as an outpatient for new onset AFib, given 30-day card for eliquis though with risks of bleeding due to thrombocytopenia, this risk is felt to be prohibitive despite his risk of stroke. Eliquis is not to be taken until platelet count rebounds and discussed with outpatient providers. This was discussed in detail with the patient's wife. 3. Follow up CBC w/diff on 2/11 4. Follow up with pulmonology 2/12  Home Health: PT Equipment/Devices: None new Discharge Condition: Stable CODE STATUS: DNR Diet recommendation: As tolerated   Date discharged? 02/21/2018   How have you been since you were released from the hospital? Spoke with patient's wife, states "he is doing okay, but still not eating a whole lot, taking small bites of things here and there, he tries. His throat is very sore from radiation but is doing better with fluid intake. He has a bedside commode so that he will not fall at night to use the restroom, but always uses a cane or walker when he moves around the house."   Do you understand why you were in the hospital? yes   Do you understand the discharge instructions? yes   Where were you discharged to? Home with Wife. Daughter checks in from time to time and Grandson lives next door. They have a very good support system.    Items Reviewed:  Medications reviewed: yes - Eliquis was not started d/t his blood levels being too low.   Allergies reviewed: yes  Dietary changes reviewed: yes  Referrals reviewed: yes, Pt has a Pulmonary appt scheduled 02/25/2018 and they are waiting to hear back about the Infusion appts. Pt was discharged with Home PT orders -- pt declined, states that they do not need PT services. "pt is very  independent!"   Functional Questionnaire:   Activities of Daily Living (ADLs):   He states they are independent in the following: ambulation, bathing and hygiene, feeding, continence, grooming, toileting, dressing and "He will tell me what he wants to eat and I will fix it for him but all other daily tasks he will do for himself." States they require assistance with the following: n/a   Any transportation issues/concerns?: no   Any patient concerns? no   Confirmed importance and date/time of follow-up visits scheduled yes  Provider Appointment booked with Dr Glori Bickers on Friday 02/27/2018 at 3:45pm  Confirmed with patient if condition begins to worsen call PCP or go to the ER.  Patient was given the office number and encouraged to call back with question or concerns.  : yes

## 2018-02-24 ENCOUNTER — Telehealth: Payer: Self-pay | Admitting: Medical Oncology

## 2018-02-24 ENCOUNTER — Inpatient Hospital Stay: Payer: Medicare Other

## 2018-02-24 ENCOUNTER — Telehealth: Payer: Self-pay | Admitting: Cardiology

## 2018-02-24 DIAGNOSIS — C3411 Malignant neoplasm of upper lobe, right bronchus or lung: Secondary | ICD-10-CM

## 2018-02-24 LAB — CMP (CANCER CENTER ONLY)
ALT: 9 U/L (ref 0–44)
AST: 10 U/L — ABNORMAL LOW (ref 15–41)
Albumin: 3.3 g/dL — ABNORMAL LOW (ref 3.5–5.0)
Alkaline Phosphatase: 82 U/L (ref 38–126)
Anion gap: 8 (ref 5–15)
BUN: 10 mg/dL (ref 8–23)
CO2: 29 mmol/L (ref 22–32)
Calcium: 9.2 mg/dL (ref 8.9–10.3)
Chloride: 97 mmol/L — ABNORMAL LOW (ref 98–111)
Creatinine: 0.96 mg/dL (ref 0.61–1.24)
GFR, Est AFR Am: >60 mL/min (ref >60)
GFR, Estimated: >60 mL/min (ref >60)
Glucose, Bld: 116 mg/dL — ABNORMAL HIGH (ref 70–99)
POTASSIUM: 3.2 mmol/L — AB (ref 3.5–5.1)
Sodium: 134 mmol/L — ABNORMAL LOW (ref 135–145)
Total Bilirubin: 0.6 mg/dL (ref 0.3–1.2)
Total Protein: 5.9 g/dL — ABNORMAL LOW (ref 6.5–8.1)

## 2018-02-24 LAB — CBC WITH DIFFERENTIAL (CANCER CENTER ONLY)
Abs Immature Granulocytes: 0.41 10*3/uL — ABNORMAL HIGH (ref 0.00–0.07)
BASOS ABS: 0 10*3/uL (ref 0.0–0.1)
Basophils Relative: 1 %
Eosinophils Absolute: 0 10*3/uL (ref 0.0–0.5)
Eosinophils Relative: 0 %
HCT: 28.3 % — ABNORMAL LOW (ref 39.0–52.0)
Hemoglobin: 9.3 g/dL — ABNORMAL LOW (ref 13.0–17.0)
IMMATURE GRANULOCYTES: 6 %
Lymphocytes Relative: 3 %
Lymphs Abs: 0.2 10*3/uL — ABNORMAL LOW (ref 0.7–4.0)
MCH: 29.2 pg (ref 26.0–34.0)
MCHC: 32.9 g/dL (ref 30.0–36.0)
MCV: 88.7 fL (ref 80.0–100.0)
Monocytes Absolute: 0.7 10*3/uL (ref 0.1–1.0)
Monocytes Relative: 11 %
Neutro Abs: 5 10*3/uL (ref 1.7–7.7)
Neutrophils Relative %: 79 %
PLATELETS: 86 10*3/uL — AB (ref 150–400)
RBC: 3.19 MIL/uL — ABNORMAL LOW (ref 4.22–5.81)
RDW: 16.3 % — ABNORMAL HIGH (ref 11.5–15.5)
WBC Count: 6.4 10*3/uL (ref 4.0–10.5)
nRBC: 0 % (ref 0.0–0.2)

## 2018-02-24 LAB — CULTURE, BLOOD (ROUTINE X 2)
CULTURE: NO GROWTH
Culture: NO GROWTH
Special Requests: ADEQUATE
Special Requests: ADEQUATE

## 2018-02-24 LAB — MAGNESIUM: Magnesium: 1.3 mg/dL — CL (ref 1.7–2.4)

## 2018-02-24 NOTE — Telephone Encounter (Signed)
LVM to call back re mag oxide and how many doses he is taking.

## 2018-02-24 NOTE — Telephone Encounter (Signed)
New Message   Pt c/o medication issue:  1. Name of Medication: Eliquis  2. How are you currently taking this medication (dosage and times per day)? 5mg  2xdaily   3. Are you having a reaction (difficulty breathing--STAT)? No  4. What is your medication issue? Dr Bonner Puna from the ER told the PT to stop taking Eliquis  Pts wife is concerned about his medications and not sure if he needs to be seen sooner  Please call

## 2018-02-24 NOTE — Telephone Encounter (Signed)
Per Julien Nordmann I instructed wife to increase mag to 400 mg bid and to monitor for diarrhea. She voiced understanding.

## 2018-02-24 NOTE — Telephone Encounter (Signed)
Spoke with the pts wife Scott Gomez and she reports that the pt is off of his Eliquis and she is worried.. I explained to her that it was necessary based on his hospital D/C summary since his CBC was abnormal... she says he is having another CBC done today at [facility] and has an appt tomorrow with Pulmonary, he is seeing Dr. Glori Bickers this Friday and coming in to see Dr. Harrell Gave 03/12/18. He is already scheduled for another CBC 03/03/18... advised pt that we are here if she needs [place]. She agreed and was appreciative for the call and support.

## 2018-02-25 ENCOUNTER — Encounter: Payer: Self-pay | Admitting: Pulmonary Disease

## 2018-02-25 ENCOUNTER — Ambulatory Visit: Payer: Medicare Other | Admitting: Pulmonary Disease

## 2018-02-25 DIAGNOSIS — C3490 Malignant neoplasm of unspecified part of unspecified bronchus or lung: Secondary | ICD-10-CM

## 2018-02-25 DIAGNOSIS — G4733 Obstructive sleep apnea (adult) (pediatric): Secondary | ICD-10-CM

## 2018-02-25 DIAGNOSIS — J432 Centrilobular emphysema: Secondary | ICD-10-CM

## 2018-02-25 MED ORDER — TIOTROPIUM BROMIDE MONOHYDRATE 2.5 MCG/ACT IN AERS: 2.0000 | INHALATION_SPRAY | Freq: Every day | RESPIRATORY_TRACT | 0 refills | Status: AC

## 2018-02-25 NOTE — Assessment & Plan Note (Signed)
He has had partial response in the right upper lobe mass but unfortunately suspicious lesions in the brain have cropped up suggestive now of extensive stage small cell lung cancer He has had a rough road with chemotherapy and radiation, has lost 70 pounds and is currently very debilitated.  He certainly does not want to continue any more chemotherapy or radiation. Had a frank discussion with him with his wife present.  They prefer quality of life rather than longevity. I asked him to discuss with his oncology providers about stopping further chemotherapy and radiation.  He would be a good candidate for hospice He is also on chronic pain medication and hospice can take over that part too After much discussion, they preferred for me to make the referral and I will do so and request her PCP to take over as attending
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
## 2018-02-25 NOTE — Addendum Note (Signed)
Addended by: Valerie Salts on: 02/25/2018 12:20 PM   Modules accepted: Orders

## 2018-02-25 NOTE — Patient Instructions (Signed)
Referral to home hospice program Okay to stop using CPAP.

## 2018-02-25 NOTE — Assessment & Plan Note (Signed)
He can finish the amount of Spiriva that he has and then can go on albuterol/Atrovent nebs as needed

## 2018-02-25 NOTE — Addendum Note (Signed)
Addended by: Valerie Salts on: 02/25/2018 02:16 PM   Modules accepted: Orders

## 2018-02-25 NOTE — Assessment & Plan Note (Signed)
He has lost significant weight and his OSA is probably resolved. Can stop using CPAP

## 2018-02-26 ENCOUNTER — Other Ambulatory Visit: Payer: Self-pay | Admitting: Internal Medicine

## 2018-02-26 ENCOUNTER — Other Ambulatory Visit: Payer: Self-pay | Admitting: Medical

## 2018-02-26 DIAGNOSIS — K117 Disturbances of salivary secretion: Secondary | ICD-10-CM

## 2018-02-26 LAB — GLUCOSE, CAPILLARY
GLUCOSE-CAPILLARY: 104 mg/dL — AB (ref 70–99)
Glucose-Capillary: 102 mg/dL — ABNORMAL HIGH (ref 70–99)
Glucose-Capillary: 111 mg/dL — ABNORMAL HIGH (ref 70–99)

## 2018-02-27 ENCOUNTER — Ambulatory Visit (INDEPENDENT_AMBULATORY_CARE_PROVIDER_SITE_OTHER): Payer: Medicare Other | Admitting: Family Medicine

## 2018-02-27 ENCOUNTER — Encounter: Payer: Self-pay | Admitting: Family Medicine

## 2018-02-27 VITALS — BP 104/60 | HR 72 | Temp 97.8°F | Ht 68.75 in | Wt 165.5 lb

## 2018-02-27 DIAGNOSIS — C3411 Malignant neoplasm of upper lobe, right bronchus or lung: Secondary | ICD-10-CM

## 2018-02-27 DIAGNOSIS — J432 Centrilobular emphysema: Secondary | ICD-10-CM

## 2018-02-27 DIAGNOSIS — C3490 Malignant neoplasm of unspecified part of unspecified bronchus or lung: Secondary | ICD-10-CM

## 2018-02-27 DIAGNOSIS — R627 Adult failure to thrive: Secondary | ICD-10-CM

## 2018-02-27 DIAGNOSIS — C771 Secondary and unspecified malignant neoplasm of intrathoracic lymph nodes: Secondary | ICD-10-CM

## 2018-02-27 DIAGNOSIS — I82621 Acute embolism and thrombosis of deep veins of right upper extremity: Secondary | ICD-10-CM

## 2018-02-27 DIAGNOSIS — I10 Essential (primary) hypertension: Secondary | ICD-10-CM | POA: Diagnosis not present

## 2018-02-27 DIAGNOSIS — Z95828 Presence of other vascular implants and grafts: Secondary | ICD-10-CM

## 2018-02-27 MED ORDER — PROMETHAZINE HCL 25 MG RE SUPP: 25.0000 mg | Freq: Four times a day (QID) | RECTAL | 3 refills | Status: AC | PRN

## 2018-02-27 NOTE — Telephone Encounter (Signed)
Refill request.  Dr. Julien Nordmann primary oncologist.  Next MD F/U: 03/03/2018.

## 2018-02-27 NOTE — Progress Notes (Signed)
Subjective:    Patient ID: Scott Gomez, male    DOB: 03/23/1934, 84 y.o.   MRN: [id number]  HPI  Here for f/u of hospitalization from 2/6 to 02/21/18 for multiple problems related to lung cancer and his treatment including failure to thrive and new set a [facility] course included following A/P Discharge Diagnoses:  Active Problems:   GERD (gastroesophageal reflux disease)   Hypomagnesemia   Failure to thrive in adult   Protein-calorie malnutrition, severe  Failure to thrive probably secondary to persistent nausea and vomiting associated with mucositis from chemotherapy: Patient has 50 pound weight loss since starting chemo as per the family.  - Improved with supportive management for nausea and vomiting.  -Magic mouthwash for mucositis. - Discussed with Dr. Julien Nordmann at admission, see below.  New onset atrial fibrillation, paroxysmal: - Rate controlled.  - Previously on eliquis for history of DVT. Discussed risks of bleeding with patient and wife. With thrombocytopenia, will not restart at this time. But did provide with 30-day card in the event that he does decide to start anticoagulation.  - Will need cardiology follow up, to be scheduled this coming week.  - Echo showed LVEF 60-65%, no LVH, normal RV, severe LAE.   Limited stage small cell lung cancer on chemotherapy (etoposide/carboplatin): - Considering discontinuation of therapy given his poor tolerance. - Follow-up with Dr. Julien Nordmann as outpatient.  Lab Results  Component Value Date   WBC 6.4 02/24/2018   HGB 9.3 (L) 02/24/2018   HCT 28.3 (L) 02/24/2018   MCV 88.7 02/24/2018   PLT 86 (L) 02/24/2018     Pancytopenia secondary to recent chemotherapy:  - Discussed with Dr. Marin Olp, recommended to give granix prior to discharge. No fever. Has close lab follow up previously scheduled.   Thrombocytopenia: As recently as 1/30 platelets normal, anticipate rebound in platelet count. Plt's stable while  admitted.  History of prior DVT on eliquis - Eliquis discontinued in January due to cost.  History of ascending thoracic aortic aneurysm - Recommend outpatient follow-up with vascular studies.  COPD: Stable. 12-15 breath rate Does nmts and spiriva  Sob on exertion as well   Hypokalemia, hypomagnesemia, hypophosphatemia - Replaced as needed.  Depression - Continue with Paxil  Anxiety - Continue with Xanax  In regards to cancer and tx has now decided to pursue comfort care/hospice  PT/home care as well   Blood cultures were negative   Had visit with Dr Elsworth Soho   A/p: as follows He has had partial response in the right upper lobe mass but unfortunately suspicious lesions in the brain have cropped up suggestive now of extensive stage small cell lung cancer He has had a rough road with chemotherapy and radiation, has lost 70 pounds and is currently very debilitated.  He certainly does not want to continue any more chemotherapy or radiation. Had a frank discussion with him with his wife present.  They prefer quality of life rather than longevity. I asked him to discuss with his oncology providers about stopping further chemotherapy and radiation.  He would be a good candidate for hospice He is also on chronic pain medication and hospice can take over that part too After much discussion, they preferred for me to make the referral and I will do so and request her PCP to take over as attending   Today Wt Readings from Last 3 Encounters:  02/27/18 165 lb 8 oz (75.1 kg)  02/25/18 152 lb (68.9 kg)  02/20/18 170  lb 13.7 oz (77.5 kg)   24.62 kg/m  BP Readings from Last 3 Encounters:  02/27/18 104/60  02/25/18 110/68  02/21/18 107/69   Pulse Readings from Last 3 Encounters:  02/27/18 72  02/25/18 84  02/21/18 79   Nausea when eats or drinks  ? If will improve at all with no chemo  Infrequent bm due to little intake  Does take stool softener   Wants to try rectal  suppositories   Does not want a feeding tube   Has some skin issues on neck  Wife dresses with silvadene bid dressing   ? If he has headache-he says not  Oxycodone for pain   Too weak for PT   Some depression  Does not feel like a lot of company   Is able to sleep all night -has bedside commode   Vision is fuzzy at times   Patient Active Problem List   Diagnosis Date Noted  . Secondary and unspecified malignant neoplasm of intrathoracic lymph nodes ([place]) 03/01/2018  . Protein-calorie malnutrition, severe 02/20/2018  . Failure to thrive in adult 02/19/2018  . Port-A-Cath in place 01/08/2018  . Protein calorie malnutrition ([place]) 12/26/2017  . Hypomagnesemia 12/26/2017  . Malnutrition of moderate degree 12/18/2017  . Duodenitis   . Small cell lung cancer, right upper lobe ([place]) 12/04/2017  . Encounter for antineoplastic chemotherapy 12/04/2017  . Goals of care, counseling/discussion 12/04/2017  . DVT (deep venous thrombosis) ([place]) 11/27/2017  . Aortic aneurysm ([place]) 11/18/2017  . Small cell lung cancer ([place]) 11/16/2017  . GERD (gastroesophageal reflux disease) 11/07/2017  . Routine general medical examination at a health care facility 04/09/2015  . Osteoporosis 04/07/2015  . Constipation 05/16/2014  . Polyneuropathy 03/23/2014  . Encounter for Medicare annual wellness exam 11/25/2012  . Spinal stenosis 08/27/2011  . Vitamin B 12 deficiency 08/06/2011  . Falls frequently 05/01/2011  . Prostate cancer screening 11/07/2010  . DEPRESSION 02/26/2010  . Essential hypertension, benign 03/29/2008  . COPD (chronic obstructive pulmonary disease) ([place]) 07/08/2007  . Sleep apnea 07/08/2007  . TESTOSTERONE DEFICIENCY 06/22/2007  . Prediabetes 01/07/2007  . Generalized anxiety disorder 01/07/2007  . CARPAL TUNNEL SYNDROME 01/07/2007  . Peptic ulcer 01/07/2007  . OSTEOARTHRITIS, HANDS, BILATERAL 01/07/2007  . SHOULDER PAIN, LEFT, CHRONIC 01/07/2007  . PALPITATIONS, HX OF  01/07/2007  . Nephrolithiasis 01/07/2007   Past Medical History:  Diagnosis Date  . Anxiety   . Arthritis    osteoarthritis both hands  . Carpal tunnel syndrome   . COPD (chronic obstructive pulmonary disease) ([place])   . CPAP (continuous positive airway pressure) dependence   . Diabetes mellitus    type II  . Fatigue   . History of kidney stones   . Hypercholesteremia   . Hypopotassemia   . Leg cramps   . Lung cancer ([place])   . Nephrolithiasis    Hx of  . Palpitations    Hx of   . Shoulder pain, left   . Snoring   . Testosterone deficiency   . Ulcer    peptic ulcer disease   Past Surgical History:  Procedure Laterality Date  . BACK SURGERY  1978   x2 disc  . IR IMAGING GUIDED PORT INSERTION  12/19/2017   Social History   Tobacco Use  . Smoking status: Former Smoker    Packs/day: 2.00    Years: 38.00    Pack years: 76.00    Types: Cigarettes    Last attempt to quit: 01/14/1993  Years since quitting: 25.1  . Smokeless tobacco: Never Used  Substance Use Topics  . Alcohol use: No    Alcohol/week: 0.0 standard drinks  . Drug use: No   Family History  Problem Relation Age of Onset  . Heart disease Brother        MI  . Kidney cancer Brother   . Hypertension Mother   . Allergies Mother   . Hypertension Father   . Allergies Father   . Diabetes Father   . Heart disease Father   . Breast cancer Sister   . Kidney cancer Sister   . Colon cancer Neg Hx   . Liver cancer Neg Hx   . AAA (abdominal aortic aneurysm) Neg Hx    Allergies  Allergen Reactions  . Buspirone Hcl     REACTION: itching  . Crestor [Rosuvastatin Calcium]     Increased his blood sugar   . Prednisone     REACTION: sick, per wife he can tolerate for a few days and then stomach upset occurs.    Current Outpatient Medications on File Prior to Visit  Medication Sig Dispense Refill  . albuterol (PROAIR HFA) 108 (90 Base) MCG/ACT inhaler Inhale 2 puffs into the lungs every 4 (four) hours as  needed for wheezing or shortness of breath. 1 Inhaler 2  . ALPRAZolam (XANAX) 0.5 MG tablet TAKE ONE (1) TABLET BY MOUTH TWO (2) TIMES DAILY AS NEEDED FOR ANXIETY (Patient taking differently: Take 0.5 mg by mouth 2 (two) times daily as needed for anxiety. ) 60 tablet 3  . Blood Glucose Monitoring Suppl (ONETOUCH VERIO IQ SYSTEM) w/Device KIT Use to test blood sugar once daily and as needed dx R73.9 1 kit 0  . cyclobenzaprine (FLEXERIL) 10 MG tablet Take 10 mg by mouth 2 (two) times daily.     Marland Kitchen docusate sodium (COLACE) 100 MG capsule Take 100 mg by mouth daily.    Marland Kitchen gabapentin (NEURONTIN) 100 MG capsule Take 200 mg by mouth 3 (three) times daily.     Marland Kitchen glucose blood (ONETOUCH VERIO) test strip USE TO CHECK BLOOD SUGAR ONCE DAILY 100 each 3  . glucose blood test strip Use as instructed 100 each 3  . levalbuterol (XOPENEX) 0.63 MG/3ML nebulizer solution Take 3 mLs (0.63 mg total) by nebulization every 4 (four) hours as needed for wheezing or shortness of breath. 3 mL 12  . lidocaine-prilocaine (EMLA) cream Apply to port 1 hour prior port access 30 g 0  . lisinopril (PRINIVIL,ZESTRIL) 10 MG tablet Take 1 tablet (10 mg total) by mouth daily. 90 tablet 3  . magnesium oxide (MAG-OX) 400 (241.3 Mg) MG tablet Take 1 tablet (400 mg total) by mouth daily. 30 tablet 1  . metoprolol tartrate (LOPRESSOR) 25 MG tablet Take 0.5 tablets (12.5 mg total) by mouth 2 (two) times daily. 30 tablet 0  . ondansetron (ZOFRAN) 8 MG tablet Take 1 tablet (8 mg total) by mouth every 8 (eight) hours as needed for nausea or vomiting. 30 tablet 1  . ONE TOUCH LANCETS MISC Use to test blood sugar once daily and as needed. Dx 790.29 200 each 1  . Oxycodone HCl 10 MG TABS Take 10 mg by mouth every 6 (six) hours as needed (pain).     Marland Kitchen PARoxetine (PAXIL) 10 MG tablet Take 1 tablet (10 mg total) by mouth daily. 30 tablet 11  . potassium chloride SA (K-DUR,KLOR-CON) 20 MEQ tablet TAKE ONE (1) TABLET BY MOUTH EVERY DAY (Patient taking  differently: Take 20 mEq by mouth daily. ) 90 tablet 3  . scopolamine (TRANSDERM-SCOP) 1 MG/3DAYS Place 1 patch (1.5 mg total) onto the skin every 3 (three) days. 10 patch 12  . sucralfate (CARAFATE) 1 g tablet Take 1 tablet (1 g total) by mouth 4 (four) times daily -  with meals and at bedtime. 5 min before meals for radiation induced esophagitis 120 tablet 2  . Tiotropium Bromide Monohydrate (SPIRIVA RESPIMAT) 2.5 MCG/ACT AERS Inhale 2 puffs into the lungs daily. 4 g 11  . Tiotropium Bromide Monohydrate (SPIRIVA RESPIMAT) 2.5 MCG/ACT AERS Inhale 2 puffs into the lungs daily. 2 Inhaler 0  . travoprost, benzalkonium, (TRAVATAN) 0.004 % ophthalmic solution Place 1 drop into both eyes at bedtime.      . vitamin B-12 (CYANOCOBALAMIN) 1000 MCG tablet Take 1,000 mcg by mouth daily.    . hyoscyamine (LEVSIN SL) 0.125 MG SL tablet PLACE 1 TABLET (0.125 MG TOTAL) UNDER THE TONGUE EVERY 12 (TWELVE) HOURS AS NEEDED. 30 tablet 0  . pantoprazole (PROTONIX) 40 MG tablet Take 1 tablet (40 mg total) by mouth 2 (two) times daily. 180 tablet 3  . potassium chloride SA (K-DUR,KLOR-CON) 20 MEQ tablet Take 1 tablet (20 mEq total) by mouth 2 (two) times daily for 10 days. 20 tablet 0   Current Facility-Administered Medications on File Prior to Visit  Medication Dose Route Frequency Provider Last Rate Last Dose  . heparin lock flush 100 unit/mL  500 Units Intracatheter Once PRN Mohamed, Mohamed, MD      . sodium chloride flush (NS) 0.9 % injection 10 mL  10 mL Intracatheter PRN Mohamed, Mohamed, MD        Review of Systems  Constitutional: Positive for activity change, appetite change and fatigue. Negative for chills, diaphoresis, fever and unexpected weight change.  HENT: Positive for postnasal drip. Negative for congestion, rhinorrhea, sore throat and trouble swallowing.   Eyes: Positive for visual disturbance. Negative for pain, redness and itching.  Respiratory: Positive for cough and choking. Negative for chest  tightness, shortness of breath, wheezing and stridor.   Cardiovascular: Negative for chest pain and palpitations.  Gastrointestinal: Positive for nausea and vomiting. Negative for abdominal pain, blood in stool, constipation and diarrhea.  Endocrine: Negative for cold intolerance, heat intolerance, polydipsia and polyuria.  Genitourinary: Negative for difficulty urinating, dysuria, frequency and urgency.  Musculoskeletal: Positive for back pain and gait problem. Negative for arthralgias, joint swelling and myalgias.  Skin: Negative for pallor and rash.  Neurological: Positive for weakness. Negative for dizziness, tremors, numbness and headaches.       Generalized weakness  Hematological: Negative for adenopathy. Does not bruise/bleed easily.  Psychiatric/Behavioral: Positive for dysphoric mood. Negative for decreased concentration. The patient is not nervous/anxious.        Continues tx for depression in setting of terminal cancer        Objective:   Physical Exam Constitutional:      General: He is not in acute distress.    Appearance: He is normal weight. He is ill-appearing. He is not toxic-appearing or diaphoretic.     Comments: Nauseated and dry heaves when trying to take in fluid In wheelchair-weak and frail appearing   HENT:     Head: Normocephalic and atraumatic.     Mouth/Throat:     Mouth: Mucous membranes are moist.     Pharynx: Oropharynx is clear.  Eyes:     General: No scleral icterus.    Extraocular Movements: Extraocular movements intact.       Conjunctiva/sclera: Conjunctivae normal.     Pupils: Pupils are equal, round, and reactive to light.  Cardiovascular:     Rate and Rhythm: Normal rate and regular rhythm.     Pulses: Normal pulses.  Pulmonary:     Effort: Pulmonary effort is normal. No respiratory distress.     Breath sounds: Normal breath sounds. No wheezing or rales.     Comments: Diffusely distant bs  Abdominal:     General: Abdomen is flat. Bowel  sounds are normal. There is no distension.     Palpations: Abdomen is soft. There is no mass.     Tenderness: There is no abdominal tenderness.  Musculoskeletal:     Right lower leg: No edema.     Left lower leg: No edema.  Lymphadenopathy:     Cervical: No cervical adenopathy.  Skin:    Coloration: Skin is not jaundiced.     Findings: No erythema or rash.  Neurological:     Mental Status: He is alert. Mental status is at baseline.     Motor: Weakness present.  Psychiatric:        Mood and Affect: Mood is depressed.        Speech: Speech normal.        Behavior: Behavior normal.     Comments: Answers questions appropriately  Fatigued but mood is fair given circumstances Not overly anxious            Assessment & Plan:   Problem List Items Addressed This Visit      Cardiovascular and Mediastinum   Essential hypertension, benign    bp in fair control at this time  BP Readings from Last 1 Encounters:  02/27/18 104/60   No changes needed Most recent labs reviewed  Disc lifstyle change with low sodium diet and exercise  If bp lowers further can stop ace      DVT (deep venous thrombosis) (HCC)    No longer on anticoag due to low platelet count  Now hospice pt due to lung cancer        Respiratory   COPD (chronic obstructive pulmonary disease) (HCC)    Overall stable Will transition to nmt only now that he is in hospice care for lung cancer       Relevant Medications   promethazine (PHENERGAN) 25 MG suppository   Small cell lung cancer (HCC)    Intolerant of chemo Has decided at this time to transition to hospice/comfort care  Hope he feels better as he gets farther out from chemo (esp nausea and inability to eat)       Small cell lung cancer, right upper lobe (HCC) - Primary    Hospice now        Immune and Lymphatic   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 If helpful for hospice med admin however      Failure to thrive in adult    Hope as he gets farther from chemo his nausea may improve  Hospice/comfort care now  Px phenergan suppositories at wife's req since he is not keeping oral med down Enc calories as tolerated/ ensure or other supplements and fluids          

## 2018-02-27 NOTE — Patient Instructions (Addendum)
I hope as the chemo leaves your system you will feel better and nausea improves Get calories and fluids in however you can  I will keep up with hospice - will treat with goal of comfort   Please let me know if you need anything

## 2018-03-01 DIAGNOSIS — C771 Secondary and unspecified malignant neoplasm of intrathoracic lymph nodes: Secondary | ICD-10-CM | POA: Insufficient documentation

## 2018-03-01 NOTE — Assessment & Plan Note (Signed)
Pt has this as well as brain lesions now  Elected to have comfort care/hospice due to intol of chemo

## 2018-03-01 NOTE — Assessment & Plan Note (Signed)
Hospice now

## 2018-03-01 NOTE — Assessment & Plan Note (Signed)
bp in fair control at this time  BP Readings from Last 1 Encounters:  02/27/18 104/60   No changes needed Most recent labs reviewed  Disc lifstyle change with low sodium diet and exercise  If bp lowers further can stop ace

## 2018-03-01 NOTE — Assessment & Plan Note (Signed)
Overall stable Will transition to nmt only now that he is in hospice care for lung cancer

## 2018-03-01 NOTE — Assessment & Plan Note (Signed)
Hope as he gets farther from chemo his nausea may improve  Hospice/comfort care now  Px phenergan suppositories at wife's req since he is not keeping oral med down Enc calories as tolerated/ ensure or other supplements and fluids

## 2018-03-01 NOTE — Assessment & Plan Note (Signed)
Intolerant of chemo Has decided at this time to transition to hospice/comfort care  Hope he feels better as he gets farther out from chemo (esp nausea and inability to eat)

## 2018-03-01 NOTE — Assessment & Plan Note (Signed)
No longer on anticoag due to low platelet count  Now hospice pt due to lung cancer

## 2018-03-01 NOTE — Assessment & Plan Note (Signed)
Unsure if this will be removed now that he is hospice status  ? If helpful for hospice med admin however

## 2018-03-02 ENCOUNTER — Telehealth: Payer: Self-pay | Admitting: *Deleted

## 2018-03-02 MED ORDER — PROMETHAZINE HCL 25 MG PO TABS: 25.0000 mg | ORAL_TABLET | Freq: Four times a day (QID) | ORAL | 1 refills | Status: AC | PRN

## 2018-03-02 NOTE — Telephone Encounter (Signed)
I sent it  Thanks  Let me know if any problems

## 2018-03-02 NOTE — Telephone Encounter (Signed)
Scott Gomez at [facility] called stating that Hospice will not cover Promethazine suppositories. Scott Gomez wants to know if the phenergan can be changed to pills and patient can use the pills rectally.  Please advise

## 2018-03-02 NOTE — Telephone Encounter (Signed)
Family called to cancel all appts. States he is with Hospice now.

## 2018-03-02 NOTE — Telephone Encounter (Signed)
She said he cannot keep the pills down- does she think he could now?  If not-perhaps she could pay for the suppositories out of pocket (unsure how much they would be)   Let me know

## 2018-03-02 NOTE — Telephone Encounter (Signed)
Spoke with Izora Gala (pharmacist) at pharmacy and she said the phenergan suppositories are very expensive out of pocket she said it's a couple hundreds of dollars. Izora Gala said she hasn't heard of using the pills rectally either but that's what the Hospice nurse said they do when the suppositories are not covered she said hospice said they do that often. She did say again that the pills are covered through hospice, please advise

## 2018-03-03 ENCOUNTER — Inpatient Hospital Stay: Payer: Medicare Other

## 2018-03-03 ENCOUNTER — Inpatient Hospital Stay: Payer: Medicare Other | Admitting: Internal Medicine

## 2018-03-04 ENCOUNTER — Ambulatory Visit: Payer: Medicare Other

## 2018-03-05 ENCOUNTER — Ambulatory Visit: Payer: Medicare Other

## 2018-03-10 ENCOUNTER — Other Ambulatory Visit: Payer: Medicare Other

## 2018-03-12 ENCOUNTER — Ambulatory Visit: Payer: Medicare Other | Admitting: Cardiology

## 2018-03-13 MED FILL — BISACODYL 10 MG SUPP: 10 | 3 days supply | Qty: 12 | Fill #0

## 2018-03-17 ENCOUNTER — Other Ambulatory Visit: Payer: Medicare Other

## 2018-03-24 ENCOUNTER — Ambulatory Visit: Payer: Medicare Other

## 2018-03-24 ENCOUNTER — Other Ambulatory Visit: Payer: Medicare Other

## 2018-03-24 ENCOUNTER — Ambulatory Visit: Payer: Medicare Other | Admitting: Internal Medicine

## 2018-03-25 ENCOUNTER — Ambulatory Visit: Payer: Medicare Other

## 2018-03-26 ENCOUNTER — Ambulatory Visit: Payer: Medicare Other

## 2018-03-30 ENCOUNTER — Telehealth: Payer: Self-pay | Admitting: Radiation Oncology

## 2018-03-30 NOTE — Telephone Encounter (Signed)
I updated Dr. Julien Nordmann that you would like patient to have a follow up with him.   He states patient does not want to come back and be seen.  This note is documented in the chart, last phone encounter.

## 2018-03-30 NOTE — Telephone Encounter (Signed)
I called the patient to let him know we'd cancel his appointment tomorrow. The patient was confused and hung up the phone before I could discuss with him plans to cancel the appt tomorrow and for further discussion about future care. I also left a message for his daughter to call me, and I called back and was able to leave a message asking the patient or his wife to reach back out to me.

## 2018-03-30 NOTE — Telephone Encounter (Signed)
New message:     Cld pt to reschedule appt for today per Provider and pt's wife states he will not be coming back to the [facility] and no need to reschedule.

## 2018-03-31 ENCOUNTER — Ambulatory Visit: Payer: Self-pay | Admitting: Radiation Oncology

## 2018-03-31 NOTE — Progress Notes (Signed)
  Radiation Oncology         [phone]) [phone] ________________________________  Name: Scott Gomez MRN: [id number]  Date: 02/16/2018  DOB: Sep 17, 1934  End of Treatment Note  Diagnosis:  84 y.o. male with Limited Stage Small Cell Carcinoma of the right lung with regional adenopathy in the right hilum, left mediastinum, and right supraclavicular site  Indication for treatment::  curative       Radiation treatment dates:   12/30/2017 - 02/16/2018  Site/dose:   The patient was treated to the disease within the right lung initially to a dose of 60 Gy using a 4 field, 3-D conformal technique. The patient then received a cone down boost treatment for an additional 6 Gy. This yielded a final total dose of 66 Gy.   Narrative: The patient tolerated radiation treatment relatively well.   The patient did experience esophagitis during the course of treatment which required management with oxycodone and Carafate. He reports low energy level and weight loss as a result of poor appetite. He does have moist desquamation present to his right supraclavicular region and will continue applying Silvadene to this area. He also has erythema and dry desquamation in his upper back.   Plan: The patient has completed radiation treatment. The patient will return to radiation oncology clinic for routine followup in one month. I advised the patient to call or return sooner if they have any questions or concerns related to their recovery or treatment. ________________________________  Jodelle Gross, MD, PhD  This document serves as a record of services personally performed by Kyung Rudd, MD. It was created on his behalf by Rae Lips, a trained medical scribe. The creation of this record is based on the scribe's personal observations and the provider's statements to them. This document has been checked and approved by the attending provider.

## 2018-04-08 ENCOUNTER — Other Ambulatory Visit: Payer: Self-pay | Admitting: Family Medicine

## 2018-04-08 ENCOUNTER — Other Ambulatory Visit: Payer: Self-pay | Admitting: *Deleted

## 2018-04-08 NOTE — Telephone Encounter (Signed)
I tried to send it to [facility] but I got a hard stop that indicated I could not send it there and had to change it to [place]   What do I do?

## 2018-04-08 NOTE — Telephone Encounter (Signed)
Doren Custard at [facility] left a voicemail stating that they received the script for Temazepam for patient. Doren Custard stated that they are closed because of the coronavirus.  Doren Custard stated that they are unable to transfer the script. Doren Custard requested that the script be sent to [facility] for the patient.

## 2018-04-08 NOTE — Telephone Encounter (Signed)
Spoke with Scott Gomez to verify that Potassium and Temazepam RXs were needed for refill. We did not have Temazepam on file. Patient is a hospice care patient. Refills for review sent to Dr. Glori Bickers.

## 2018-04-09 MED ORDER — TEMAZEPAM 15 MG PO CAPS: ORAL_CAPSULE | ORAL | 1 refills | Status: AC

## 2018-04-09 NOTE — Telephone Encounter (Signed)
It went through today

## 2018-04-14 MED FILL — oxyCODONE HCL 10 MG TABS: 10 | 30 days supply | Qty: 120 | Fill #0

## 2018-04-15 ENCOUNTER — Ambulatory Visit: Payer: Medicare Other

## 2018-04-15 ENCOUNTER — Encounter: Payer: Self-pay | Admitting: Radiation Oncology

## 2018-04-15 NOTE — Progress Notes (Signed)
After looking further in his chart, his PCP referred him to hospice. I confirmed with [facility] in [place] he is under their care and has been since mid February 2020. We will be available as needed moving forward.

## 2018-04-17 ENCOUNTER — Encounter: Payer: Medicare Other | Admitting: Family Medicine

## 2018-04-21 ENCOUNTER — Other Ambulatory Visit: Payer: Self-pay | Admitting: Family Medicine

## 2018-04-21 MED FILL — STOOL SOFTENER/LAXATIVE 50-: 50-8.6 | 15 days supply | Qty: 100 | Fill #0

## 2018-04-21 NOTE — Telephone Encounter (Signed)
Patient's wife, Fraser Din, called and would like to change prescription for Alprazolam to [facility] .  She said patient is having a hard time with [facility]  They won't let Pat pick up patient's prescriptions. Fraser Din just wants this rx sent to [facility].

## 2018-04-22 MED ORDER — ALPRAZOLAM 0.5 MG PO TABS: ORAL_TABLET | ORAL | 3 refills | Status: DC

## 2018-04-22 NOTE — Telephone Encounter (Signed)
I hit a hard stop saying that since the original px was req from [facility] outpatient that I needed to change the req pharmacy to them   If this what pt wants? (can ask his wife)

## 2018-04-22 NOTE — Telephone Encounter (Signed)
original Rx came from [place] so I declined that refill request and said pt wants to use a new pharmacy, it should now let you send Rx to the new pharmacy which is the [facility] on file

## 2018-04-22 NOTE — Telephone Encounter (Signed)
See prev message, last filled on 12/19/17, also pt has CPE scheduled on 08/26/18, and pharmacy changed to the [facility] they requested

## 2018-05-11 ENCOUNTER — Other Ambulatory Visit: Payer: Self-pay | Admitting: Family Medicine

## 2018-05-11 MED FILL — CYCLOBENZAPRINE HCL 10 MG T: 10 | 30 days supply | Qty: 60 | Fill #0

## 2018-05-11 MED FILL — GABAPENTIN 100 MG CAPSULE: 100 | 30 days supply | Qty: 180 | Fill #0

## 2018-05-12 ENCOUNTER — Other Ambulatory Visit: Payer: Self-pay | Admitting: Family Medicine

## 2018-05-12 MED FILL — ALPRAZolam 0.5 MG TABS: 0.5 | 30 days supply | Qty: 60 | Fill #0

## 2018-05-12 NOTE — Telephone Encounter (Signed)
The 4/8 px was sent to [facility] (which is why they did not get it at [facility] outpatient) -per [facility] that px was filled  He is a hospice pt so ok to refill  Refill sent

## 2018-05-14 ENCOUNTER — Telehealth: Payer: Self-pay

## 2018-05-14 NOTE — Telephone Encounter (Signed)
Chrisitine nurse with [facility] left v/m pt is eligible to recertify for hospice per Dr Jenene Slicker. Needs verbal orders to stay on hospice service.Please advise.

## 2018-05-14 NOTE — Telephone Encounter (Signed)
Please ok that verbal order to continue hospice

## 2018-05-14 NOTE — Telephone Encounter (Signed)
Called number and was unable to leave a VM at this time

## 2018-05-15 NOTE — Telephone Encounter (Signed)
Each time I call this number it states " this call can not be completed"

## 2018-05-19 NOTE — Telephone Encounter (Signed)
Found mane number for [facility] [phone]) an spoke with Judeen Hammans, triage nurse, and advised of verbal order.

## 2018-05-22 ENCOUNTER — Other Ambulatory Visit: Payer: Self-pay | Admitting: *Deleted

## 2018-05-22 NOTE — Telephone Encounter (Signed)
Not sure which [facility] pharmacy they would like to use. Called pt/wife and no answer and no VM, just kept ringing

## 2018-05-22 NOTE — Telephone Encounter (Signed)
Patient's wife left a voicemail stating that they are changing pharmacies and needs a new script sent to the pharmacy for his Potassium. New pharmacy is [facility]

## 2018-05-25 MED ORDER — POTASSIUM CHLORIDE CRYS ER 20 MEQ PO TBCR: EXTENDED_RELEASE_TABLET | ORAL | 1 refills | Status: AC

## 2018-06-09 MED FILL — ALPRAZolam 0.5 MG TABS: 0.5 | 30 days supply | Qty: 60 | Fill #1

## 2018-06-15 DIAGNOSIS — M47817 Spondylosis without myelopathy or radiculopathy, lumbosacral region: Secondary | ICD-10-CM | POA: Diagnosis not present

## 2018-06-15 DIAGNOSIS — G894 Chronic pain syndrome: Secondary | ICD-10-CM | POA: Diagnosis not present

## 2018-06-15 DIAGNOSIS — M62830 Muscle spasm of back: Secondary | ICD-10-CM | POA: Diagnosis not present

## 2018-06-15 DIAGNOSIS — M8000XS Age-related osteoporosis with current pathological fracture, unspecified site, sequela: Secondary | ICD-10-CM | POA: Diagnosis not present

## 2018-07-06 MED FILL — PANTOPRAZOLE SOD DR 40 MG T: 40 | 90 days supply | Qty: 180 | Fill #0

## 2018-07-13 ENCOUNTER — Telehealth: Payer: Self-pay | Admitting: *Deleted

## 2018-07-13 MED ORDER — GUAIFENESIN 100 MG/5ML PO LIQD: 200.0000 mg | Freq: Three times a day (TID) | ORAL | 0 refills | Status: AC | PRN

## 2018-07-13 NOTE — Telephone Encounter (Signed)
[place] with [facility] called requesting a refill on Liquid Mucinex. Medication is not on medication list. McKayla stated to call her back if you have any questions.

## 2018-07-13 NOTE — Telephone Encounter (Signed)
I sent plain guaifenesin liquid (for prn use) to [facility] on file  This goes under brand name mucinex or robitussin  It is an expectorant  Let me know if this was what they wanted  I started with 300 ml- if he needs more than that let me know

## 2018-07-13 NOTE — Telephone Encounter (Signed)
Spoke with McKayla and she said we just need to send an Rx to pharmacy on file, no verbal order needed they just need Rx sent with directions that Dr. Glori Bickers advises

## 2018-07-13 NOTE — Telephone Encounter (Signed)
Please ok that request and let me know if they need a px faxed somewhere

## 2018-07-13 NOTE — Telephone Encounter (Signed)
Kayla aware

## 2018-08-03 ENCOUNTER — Telehealth: Payer: Self-pay | Admitting: Family Medicine

## 2018-08-03 NOTE — Telephone Encounter (Signed)
Yes, no problem Tell me when it arrives thanks

## 2018-08-03 NOTE — Telephone Encounter (Signed)
Estill Bamberg with [employer] [employer] is calling to see if you are able to sign a death certificate for the patient who passed.   [facility] C/B # [id number]

## 2018-08-03 NOTE — Telephone Encounter (Signed)
Estill Bamberg notified of Dr. Marliss Coots comments. She will drop off death cert.

## 2018-08-15 DEATH — deceased

## 2018-08-19 ENCOUNTER — Ambulatory Visit: Payer: Medicare Other

## 2018-08-26 ENCOUNTER — Encounter: Payer: Medicare Other | Admitting: Family Medicine

## 2020-08-10 IMAGING — CT CT CHEST W/ CM
2 of 3 series · 14 of 36 positions shown, 17 images · IV contrast (omnipaque)
Comparison: Chest CT 12/16/2017.

CLINICAL DATA: 83-year-old male with history of right-sided lung
cancer diagnosed in November 2017 treated with chemotherapy and
radiation therapy. 13 pound weight loss over the past 2 months.
Dyspnea on exertion.

EXAM:
CT CHEST WITH CONTRAST
TECHNIQUE: Multidetector CT imaging of the chest was performed during
intravenous contrast administration.
CONTRAST:  75mL OMNIPAQUE IOHEXOL 300 MG/ML  SOLN

[Series 2: axial st · axial · 0.79mm/px · z∈[-387,-103]mm · 11 of 168 slices shown, 14 images]
[im 13/168  mediastinal]
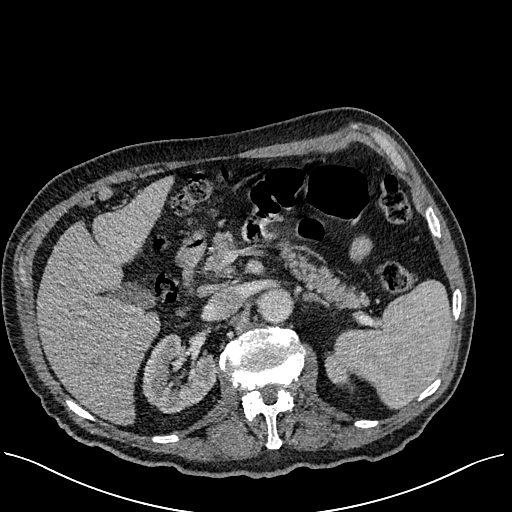
[im 13/168  lung]
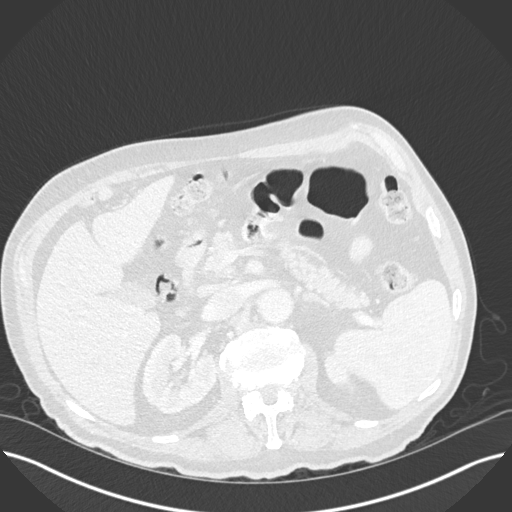
[im 25/168  lung]
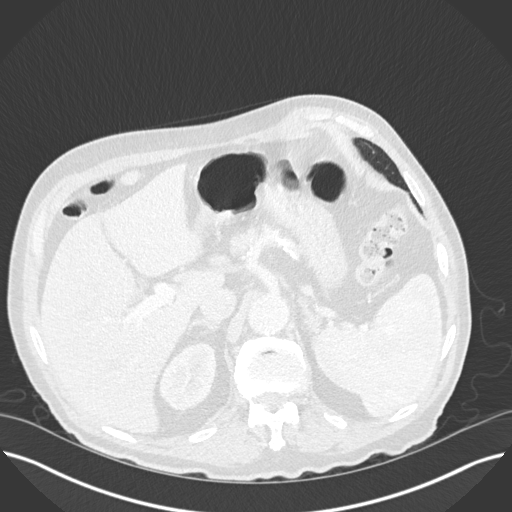
[im 38/168  lung]
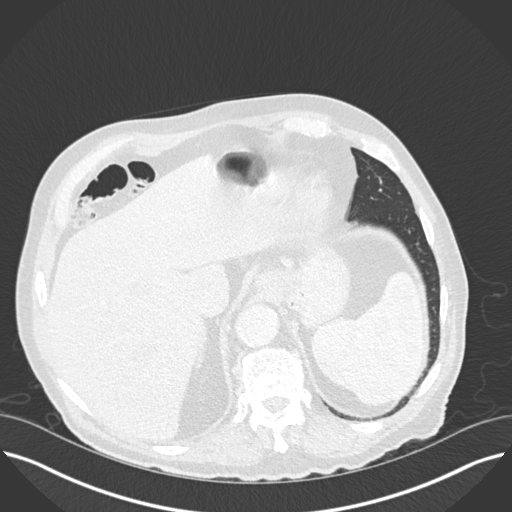
[im 56/168  lung]
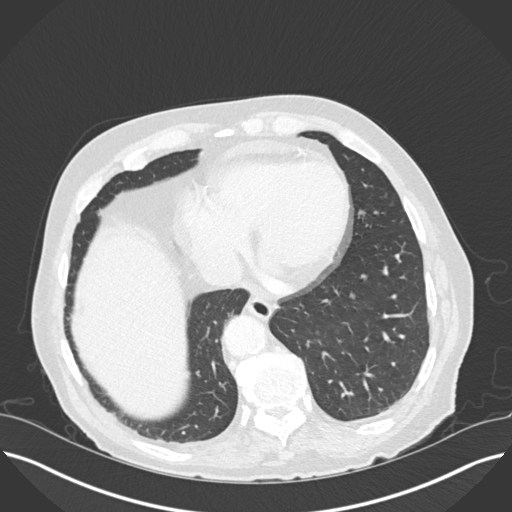
[im 69/168  mediastinal]
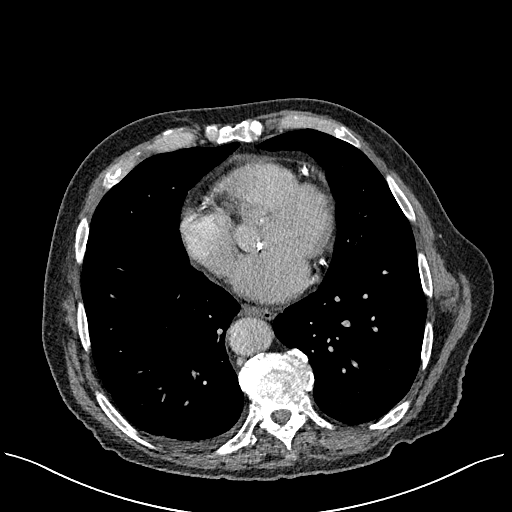
[im 69/168  lung]
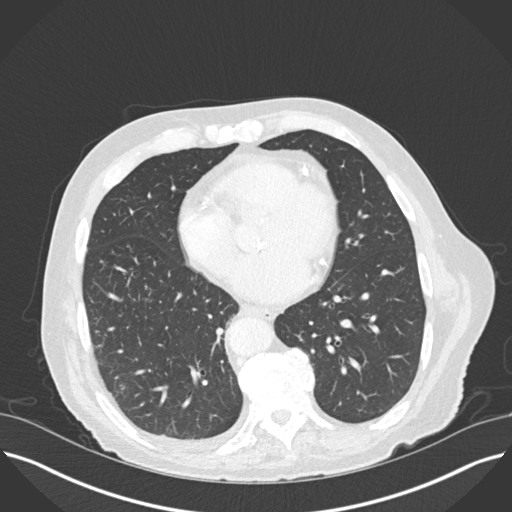
[im 87/168  lung]
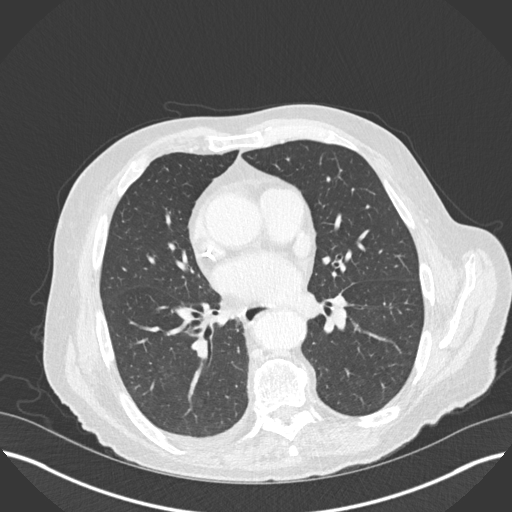
[im 99/168  lung]
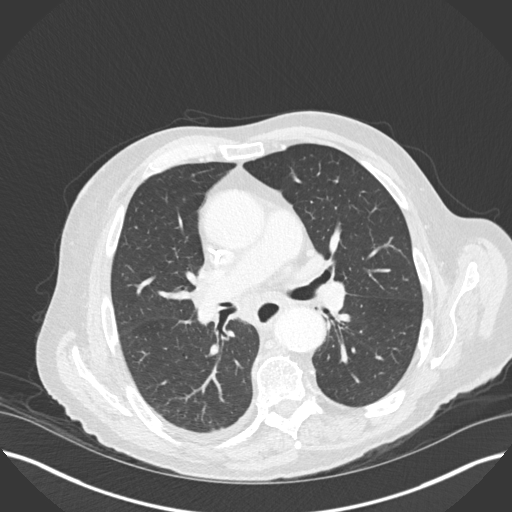
[im 112/168  lung]
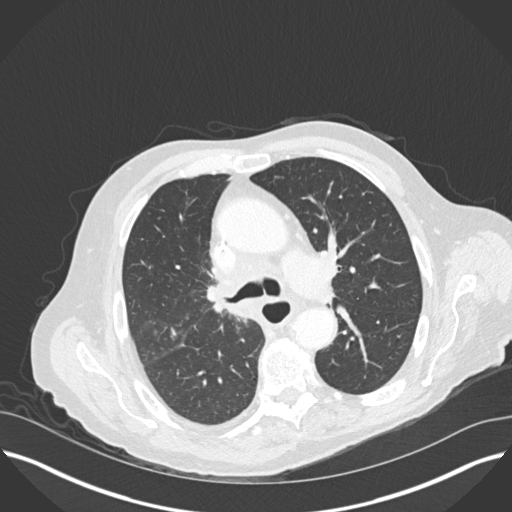
[im 130/168  mediastinal]
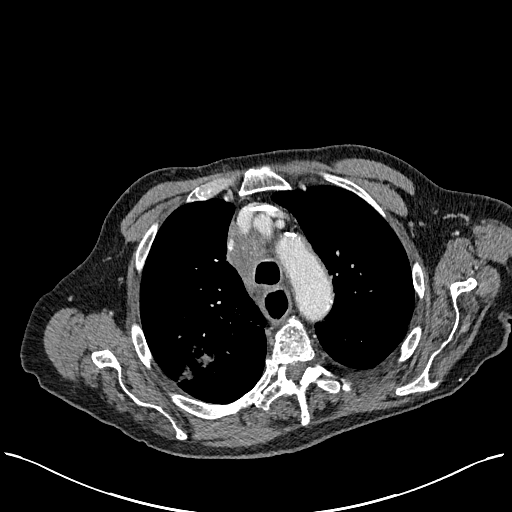
[im 130/168  lung]
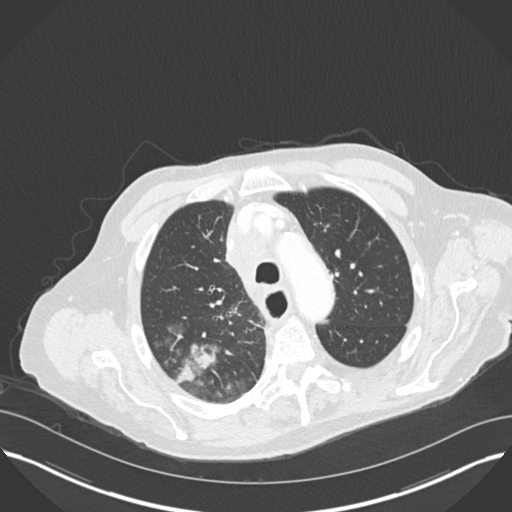
[im 143/168  lung]
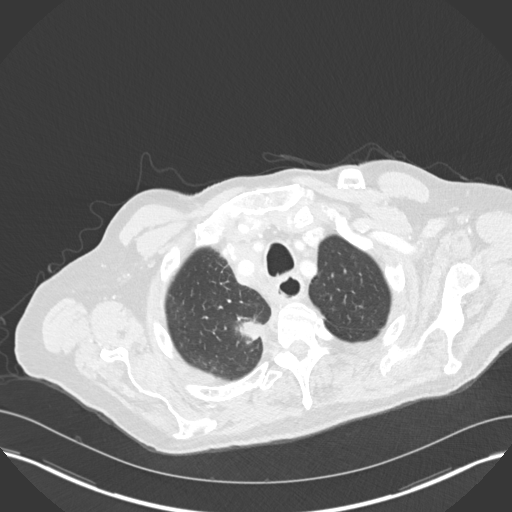
[im 155/168  lung]
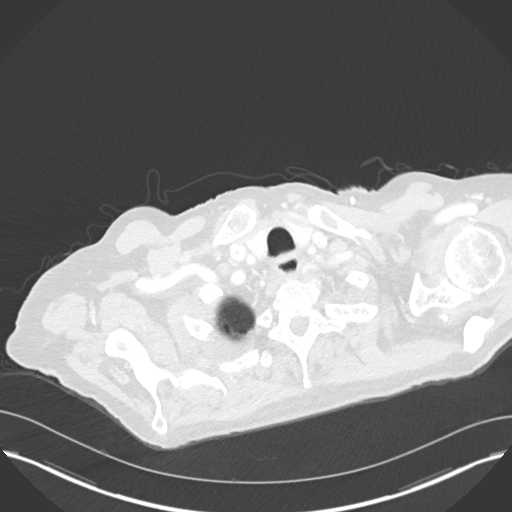

[Series 6: coronal · coronal · 0.70mm/px · 3 of 143 slices shown]
[im 29/143  lung]
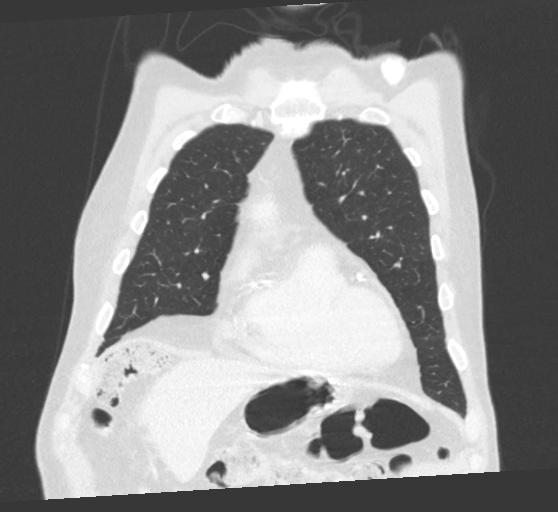
[im 57/143  lung]
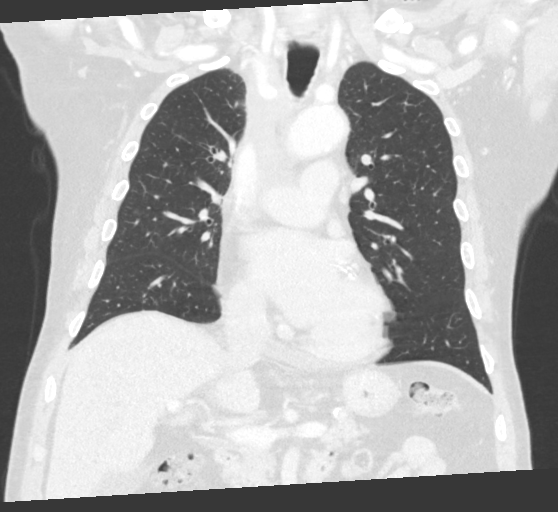
[im 86/143  lung]
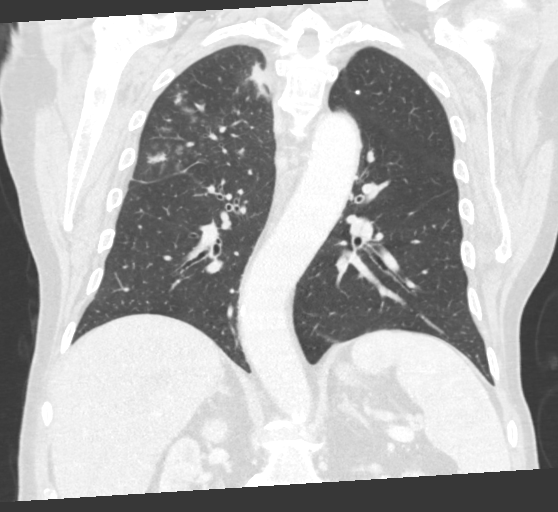

[14 of 36 positions shown; findings below may reference images not displayed]

FINDINGS: Cardiovascular: Heart size is normal. Small amount of pericardial
fluid and/or thickening, similar to the prior study and unlikely to
be of hemodynamic significance at this time. No associated
pericardial calcification. There is aortic atherosclerosis, as well
as atherosclerosis of the great vessels of the mediastinum and the
coronary arteries, including calcified atherosclerotic plaque in the
left main, left anterior descending, left circumflex and right
coronary arteries. In addition, there is aneurysmal dilatation of
the ascending thoracic aorta which measures up to 4.6 cm in
diameter. Moderate calcifications of the aortic valve. Left-sided
internal jugular single-lumen porta cath with tip terminating at the
superior cavoatrial junction.

Mediastinum/Nodes: There continues to be extensive right hilar and
mediastinal lymphadenopathy, resulting in a confluent soft tissue
mass extending from the superior mediastinum around the origin of
the right common carotid artery and innominate artery down to the
level of the subcarinal region, involving all intervening nodal
stations and extending into the right hilar region. This area is
very irregular in shape and therefore difficult to accurately
measure, however, the mass has clearly decreased in size compared to
the prior study. This is best demonstrated in a relatively avascular
region of the mass which currently measures approximately 5.2 x
cm (axial image 37 of series 2) as compared with 5.8 x 4.8 cm when
measured in a similar fashion in the same region on axial image 14
of series 2 of prior study 12/16/2017. This mass continues to exert
local mass effect upon adjacent structures causing moderate to
severe narrowing of the right internal jugular vein. Superior vena
cava remains widely patent at this time. Esophagus is unremarkable
in appearance. No axillary lymphadenopathy.

Lungs/Pleura: Continued decrease in size of the primary right upper
lobe lesion which currently measures 1.6 x 1.7 cm (axial image 26 of
series 5). There are new patchy multifocal areas of
peribronchovascular ground-glass attenuation scattered throughout
the posterior aspect of the right upper lobe adjacent to the lesion
and more laterally, presumably reflective of evolving postradiation
changes. No new suspicious appearing pulmonary nodules or masses are
noted. Trace right pleural effusion lying dependently, decreased
compared to the prior study. No left pleural effusion.

Upper Abdomen: Aortic atherosclerosis.

Musculoskeletal: Old chronic L1 compression fracture with 60% loss
of anterior vertebral body height. There are no aggressive appearing
lytic or blastic lesions noted in the visualized portions of the
skeleton.
IMPRESSION: 1. Today's study demonstrates a positive response to therapy with
decreased size of the primary right upper lobe lesion as well as the
right hilar and mediastinal nodal mass, as detailed above.
2. Decreasing trace right pleural effusion.
3. Evolving postradiation changes in the right upper lobe, as above.
4. Aortic atherosclerosis, in addition to left main and 3 vessel
coronary artery disease.
5. In addition, there is aneurysmal dilatation of the ascending
thoracic aorta which measures 4.6 cm in diameter. Ascending thoracic
aortic aneurysm. Recommend semi-annual imaging followup by CTA or
MRA and referral to cardiothoracic surgery if not already obtained.
This recommendation follows 3434
ACCF/AHA/AATS/ACR/ASA/SCA/TYSON/CEEJAY/LAUERT/IBIRRA Guidelines for the
Diagnosis and Management of Patients With Thoracic Aortic Disease.
Circulation. 3434; 121: [id number].

Aortic Atherosclerosis ([id number]-[id number].H).

## 2022-12-05 NOTE — Telephone Encounter (Signed)
Telephone call
